# Patient Record
Sex: Female | Born: 1945
Health system: Southern US, Community
[De-identification: ages and names within clinical notes are randomized; demographics above are authoritative.]

## PROBLEM LIST (undated history)

## (undated) DIAGNOSIS — I499 Cardiac arrhythmia, unspecified: Secondary | ICD-10-CM

## (undated) DIAGNOSIS — K635 Polyp of colon: Secondary | ICD-10-CM

## (undated) DIAGNOSIS — K219 Gastro-esophageal reflux disease without esophagitis: Secondary | ICD-10-CM

## (undated) DIAGNOSIS — R945 Abnormal results of liver function studies: Secondary | ICD-10-CM

## (undated) DIAGNOSIS — K5792 Diverticulitis of intestine, part unspecified, without perforation or abscess without bleeding: Secondary | ICD-10-CM

## (undated) DIAGNOSIS — G4733 Obstructive sleep apnea (adult) (pediatric): Secondary | ICD-10-CM

## (undated) DIAGNOSIS — I779 Disorder of arteries and arterioles, unspecified: Secondary | ICD-10-CM

## (undated) DIAGNOSIS — K222 Esophageal obstruction: Secondary | ICD-10-CM

## (undated) DIAGNOSIS — I10 Essential (primary) hypertension: Secondary | ICD-10-CM

## (undated) DIAGNOSIS — M1990 Unspecified osteoarthritis, unspecified site: Secondary | ICD-10-CM

## (undated) DIAGNOSIS — R9431 Abnormal electrocardiogram [ECG] [EKG]: Secondary | ICD-10-CM

## (undated) DIAGNOSIS — D126 Benign neoplasm of colon, unspecified: Secondary | ICD-10-CM

## (undated) DIAGNOSIS — I82409 Acute embolism and thrombosis of unspecified deep veins of unspecified lower extremity: Secondary | ICD-10-CM

## (undated) DIAGNOSIS — I493 Ventricular premature depolarization: Secondary | ICD-10-CM

## (undated) DIAGNOSIS — L309 Dermatitis, unspecified: Secondary | ICD-10-CM

## (undated) DIAGNOSIS — I498 Other specified cardiac arrhythmias: Secondary | ICD-10-CM

## (undated) DIAGNOSIS — E785 Hyperlipidemia, unspecified: Secondary | ICD-10-CM

## (undated) DIAGNOSIS — I2510 Atherosclerotic heart disease of native coronary artery without angina pectoris: Secondary | ICD-10-CM

## (undated) HISTORY — DX: Other specified cardiac arrhythmias: I49.8

## (undated) HISTORY — DX: Esophageal obstruction: K22.2

## (undated) HISTORY — DX: Hyperlipidemia, unspecified: E78.5

## (undated) HISTORY — PX: CHOLECYSTECTOMY: SHX55

## (undated) HISTORY — PX: EYE SURGERY: SHX253

## (undated) HISTORY — DX: Gastro-esophageal reflux disease without esophagitis: K21.9

## (undated) HISTORY — DX: Benign neoplasm of colon, unspecified: D12.6

## (undated) HISTORY — PX: ABDOMINAL HYSTERECTOMY: SHX81

## (undated) HISTORY — DX: Dermatitis, unspecified: L30.9

## (undated) HISTORY — DX: Abnormal electrocardiogram (ECG) (EKG): R94.31

## (undated) HISTORY — DX: Obstructive sleep apnea (adult) (pediatric): G47.33

## (undated) HISTORY — DX: Disorder of arteries and arterioles, unspecified: I77.9

## (undated) HISTORY — DX: Cardiac arrhythmia, unspecified: I49.9

## (undated) HISTORY — DX: Diverticulitis of intestine, part unspecified, without perforation or abscess without bleeding: K57.92

## (undated) HISTORY — DX: Ventricular premature depolarization: I49.3

## (undated) HISTORY — DX: Abnormal results of liver function studies: R94.5

## (undated) HISTORY — DX: Atherosclerotic heart disease of native coronary artery without angina pectoris: I25.10

## (undated) HISTORY — PX: KNEE SURGERY: SHX244

## (undated) HISTORY — DX: Polyp of colon: K63.5

## (undated) HISTORY — DX: Acute embolism and thrombosis of unspecified deep veins of unspecified lower extremity: I82.409

## (undated) HISTORY — DX: Essential (primary) hypertension: I10

---

## 1993-07-30 ENCOUNTER — Encounter (INDEPENDENT_AMBULATORY_CARE_PROVIDER_SITE_OTHER): Payer: Self-pay | Admitting: Gastroenterology

## 1997-06-14 ENCOUNTER — Encounter (INDEPENDENT_AMBULATORY_CARE_PROVIDER_SITE_OTHER): Payer: Self-pay | Admitting: Gastroenterology

## 1997-07-19 ENCOUNTER — Encounter: Payer: Self-pay | Admitting: Internal Medicine

## 1997-07-29 ENCOUNTER — Encounter (INDEPENDENT_AMBULATORY_CARE_PROVIDER_SITE_OTHER): Payer: Self-pay | Admitting: Gastroenterology

## 2000-01-11 ENCOUNTER — Encounter: Admission: RE | Admit: 2000-01-11 | Discharge: 2000-04-10 | Payer: Self-pay | Admitting: Family Medicine

## 2000-10-24 ENCOUNTER — Encounter (INDEPENDENT_AMBULATORY_CARE_PROVIDER_SITE_OTHER): Payer: Self-pay | Admitting: Gastroenterology

## 2000-11-18 ENCOUNTER — Encounter (INDEPENDENT_AMBULATORY_CARE_PROVIDER_SITE_OTHER): Payer: Self-pay | Admitting: Gastroenterology

## 2000-12-15 ENCOUNTER — Encounter: Payer: Self-pay | Admitting: Internal Medicine

## 2001-03-22 ENCOUNTER — Encounter: Admission: RE | Admit: 2001-03-22 | Discharge: 2001-03-22 | Payer: Self-pay | Admitting: Family Medicine

## 2001-03-22 ENCOUNTER — Encounter: Payer: Self-pay | Admitting: Family Medicine

## 2002-03-14 ENCOUNTER — Encounter: Payer: Self-pay | Admitting: Family Medicine

## 2002-03-14 ENCOUNTER — Encounter: Admission: RE | Admit: 2002-03-14 | Discharge: 2002-03-14 | Payer: Self-pay | Admitting: Family Medicine

## 2002-05-04 ENCOUNTER — Encounter: Payer: Self-pay | Admitting: Surgery

## 2002-05-09 ENCOUNTER — Encounter (INDEPENDENT_AMBULATORY_CARE_PROVIDER_SITE_OTHER): Payer: Self-pay | Admitting: Specialist

## 2002-05-09 ENCOUNTER — Encounter: Payer: Self-pay | Admitting: Surgery

## 2002-05-09 ENCOUNTER — Ambulatory Visit (HOSPITAL_COMMUNITY): Admission: RE | Admit: 2002-05-09 | Discharge: 2002-05-10 | Payer: Self-pay | Admitting: Surgery

## 2002-05-09 ENCOUNTER — Encounter (INDEPENDENT_AMBULATORY_CARE_PROVIDER_SITE_OTHER): Payer: Self-pay | Admitting: *Deleted

## 2003-09-17 ENCOUNTER — Encounter: Admission: RE | Admit: 2003-09-17 | Discharge: 2003-09-17 | Payer: Self-pay | Admitting: Family Medicine

## 2003-10-03 ENCOUNTER — Emergency Department (HOSPITAL_COMMUNITY): Admission: EM | Admit: 2003-10-03 | Discharge: 2003-10-04 | Payer: Self-pay | Admitting: Emergency Medicine

## 2004-08-11 ENCOUNTER — Encounter: Admission: RE | Admit: 2004-08-11 | Discharge: 2004-08-11 | Payer: Self-pay | Admitting: Otolaryngology

## 2004-11-13 ENCOUNTER — Encounter: Admission: RE | Admit: 2004-11-13 | Discharge: 2004-11-13 | Payer: Self-pay | Admitting: Family Medicine

## 2004-12-18 ENCOUNTER — Emergency Department (HOSPITAL_COMMUNITY): Admission: EM | Admit: 2004-12-18 | Discharge: 2004-12-18 | Payer: Self-pay | Admitting: Emergency Medicine

## 2004-12-18 ENCOUNTER — Encounter (INDEPENDENT_AMBULATORY_CARE_PROVIDER_SITE_OTHER): Payer: Self-pay | Admitting: *Deleted

## 2005-06-11 ENCOUNTER — Emergency Department (HOSPITAL_COMMUNITY): Admission: EM | Admit: 2005-06-11 | Discharge: 2005-06-11 | Payer: Self-pay | Admitting: Emergency Medicine

## 2005-06-15 ENCOUNTER — Emergency Department (HOSPITAL_COMMUNITY): Admission: EM | Admit: 2005-06-15 | Discharge: 2005-06-16 | Payer: Self-pay | Admitting: Emergency Medicine

## 2006-01-04 ENCOUNTER — Encounter: Admission: RE | Admit: 2006-01-04 | Discharge: 2006-01-04 | Payer: Self-pay | Admitting: Family Medicine

## 2006-03-11 ENCOUNTER — Ambulatory Visit (HOSPITAL_BASED_OUTPATIENT_CLINIC_OR_DEPARTMENT_OTHER): Admission: RE | Admit: 2006-03-11 | Discharge: 2006-03-11 | Payer: Self-pay | Admitting: Orthopedic Surgery

## 2006-06-06 ENCOUNTER — Ambulatory Visit: Payer: Self-pay | Admitting: Gastroenterology

## 2006-07-14 ENCOUNTER — Ambulatory Visit: Payer: Self-pay | Admitting: Gastroenterology

## 2006-07-28 ENCOUNTER — Encounter (INDEPENDENT_AMBULATORY_CARE_PROVIDER_SITE_OTHER): Payer: Self-pay | Admitting: *Deleted

## 2006-07-28 ENCOUNTER — Encounter (INDEPENDENT_AMBULATORY_CARE_PROVIDER_SITE_OTHER): Payer: Self-pay | Admitting: Specialist

## 2006-07-28 ENCOUNTER — Ambulatory Visit: Payer: Self-pay | Admitting: Gastroenterology

## 2006-08-11 ENCOUNTER — Ambulatory Visit (HOSPITAL_COMMUNITY): Admission: RE | Admit: 2006-08-11 | Discharge: 2006-08-11 | Payer: Self-pay | Admitting: Cardiology

## 2006-08-24 ENCOUNTER — Emergency Department (HOSPITAL_COMMUNITY): Admission: EM | Admit: 2006-08-24 | Discharge: 2006-08-24 | Payer: Self-pay | Admitting: Emergency Medicine

## 2006-08-31 ENCOUNTER — Ambulatory Visit: Payer: Self-pay | Admitting: Internal Medicine

## 2006-09-22 ENCOUNTER — Ambulatory Visit: Payer: Self-pay | Admitting: Internal Medicine

## 2007-02-03 ENCOUNTER — Encounter: Admission: RE | Admit: 2007-02-03 | Discharge: 2007-02-03 | Payer: Self-pay | Admitting: Family Medicine

## 2007-06-23 ENCOUNTER — Encounter: Admission: RE | Admit: 2007-06-23 | Discharge: 2007-06-23 | Payer: Self-pay | Admitting: Family Medicine

## 2007-07-11 ENCOUNTER — Encounter: Admission: RE | Admit: 2007-07-11 | Discharge: 2007-07-11 | Payer: Self-pay | Admitting: Family Medicine

## 2007-12-22 ENCOUNTER — Encounter (INDEPENDENT_AMBULATORY_CARE_PROVIDER_SITE_OTHER): Payer: Self-pay | Admitting: *Deleted

## 2007-12-22 ENCOUNTER — Encounter: Admission: RE | Admit: 2007-12-22 | Discharge: 2007-12-22 | Payer: Self-pay | Admitting: Family Medicine

## 2008-02-14 ENCOUNTER — Encounter: Admission: RE | Admit: 2008-02-14 | Discharge: 2008-02-14 | Payer: Self-pay | Admitting: Family Medicine

## 2008-06-11 ENCOUNTER — Encounter: Admission: RE | Admit: 2008-06-11 | Discharge: 2008-06-11 | Payer: Self-pay | Admitting: Family Medicine

## 2008-10-11 LAB — HM MAMMOGRAPHY

## 2008-12-17 DIAGNOSIS — K219 Gastro-esophageal reflux disease without esophagitis: Secondary | ICD-10-CM | POA: Insufficient documentation

## 2008-12-17 DIAGNOSIS — D126 Benign neoplasm of colon, unspecified: Secondary | ICD-10-CM | POA: Insufficient documentation

## 2008-12-17 DIAGNOSIS — K222 Esophageal obstruction: Secondary | ICD-10-CM | POA: Insufficient documentation

## 2008-12-17 DIAGNOSIS — K5730 Diverticulosis of large intestine without perforation or abscess without bleeding: Secondary | ICD-10-CM | POA: Insufficient documentation

## 2008-12-17 DIAGNOSIS — K648 Other hemorrhoids: Secondary | ICD-10-CM | POA: Insufficient documentation

## 2008-12-26 ENCOUNTER — Ambulatory Visit: Payer: Self-pay | Admitting: Internal Medicine

## 2008-12-26 DIAGNOSIS — Z860100 Personal history of colon polyps, unspecified: Secondary | ICD-10-CM | POA: Insufficient documentation

## 2008-12-26 DIAGNOSIS — R1013 Epigastric pain: Secondary | ICD-10-CM | POA: Insufficient documentation

## 2008-12-26 DIAGNOSIS — R1084 Generalized abdominal pain: Secondary | ICD-10-CM | POA: Insufficient documentation

## 2008-12-30 ENCOUNTER — Ambulatory Visit (HOSPITAL_COMMUNITY): Admission: RE | Admit: 2008-12-30 | Discharge: 2008-12-30 | Payer: Self-pay | Admitting: Internal Medicine

## 2009-01-01 DIAGNOSIS — E1122 Type 2 diabetes mellitus with diabetic chronic kidney disease: Secondary | ICD-10-CM | POA: Insufficient documentation

## 2009-01-01 DIAGNOSIS — E119 Type 2 diabetes mellitus without complications: Secondary | ICD-10-CM | POA: Insufficient documentation

## 2009-06-18 ENCOUNTER — Telehealth: Payer: Self-pay | Admitting: Internal Medicine

## 2009-06-19 ENCOUNTER — Telehealth (INDEPENDENT_AMBULATORY_CARE_PROVIDER_SITE_OTHER): Payer: Self-pay | Admitting: *Deleted

## 2009-07-03 ENCOUNTER — Encounter (INDEPENDENT_AMBULATORY_CARE_PROVIDER_SITE_OTHER): Payer: Self-pay | Admitting: *Deleted

## 2009-07-22 ENCOUNTER — Emergency Department (HOSPITAL_COMMUNITY): Admission: EM | Admit: 2009-07-22 | Discharge: 2009-07-22 | Payer: Self-pay | Admitting: Emergency Medicine

## 2009-08-06 ENCOUNTER — Ambulatory Visit: Payer: Self-pay | Admitting: Internal Medicine

## 2009-08-06 DIAGNOSIS — R197 Diarrhea, unspecified: Secondary | ICD-10-CM | POA: Insufficient documentation

## 2009-08-12 ENCOUNTER — Telehealth (INDEPENDENT_AMBULATORY_CARE_PROVIDER_SITE_OTHER): Payer: Self-pay | Admitting: *Deleted

## 2009-11-03 ENCOUNTER — Telehealth (INDEPENDENT_AMBULATORY_CARE_PROVIDER_SITE_OTHER): Payer: Self-pay | Admitting: *Deleted

## 2009-11-25 ENCOUNTER — Encounter: Payer: Self-pay | Admitting: Pulmonary Disease

## 2009-11-26 ENCOUNTER — Observation Stay (HOSPITAL_COMMUNITY): Admission: EM | Admit: 2009-11-26 | Discharge: 2009-11-29 | Payer: Self-pay | Admitting: Emergency Medicine

## 2009-11-26 ENCOUNTER — Encounter (INDEPENDENT_AMBULATORY_CARE_PROVIDER_SITE_OTHER): Payer: Self-pay | Admitting: Internal Medicine

## 2009-11-26 ENCOUNTER — Ambulatory Visit: Payer: Self-pay | Admitting: Internal Medicine

## 2009-12-03 ENCOUNTER — Telehealth: Payer: Self-pay | Admitting: Internal Medicine

## 2009-12-05 ENCOUNTER — Emergency Department (HOSPITAL_COMMUNITY): Admission: EM | Admit: 2009-12-05 | Discharge: 2009-12-06 | Payer: Self-pay | Admitting: Emergency Medicine

## 2009-12-08 ENCOUNTER — Telehealth: Payer: Self-pay | Admitting: Internal Medicine

## 2009-12-25 DIAGNOSIS — E785 Hyperlipidemia, unspecified: Secondary | ICD-10-CM | POA: Insufficient documentation

## 2009-12-25 DIAGNOSIS — E782 Mixed hyperlipidemia: Secondary | ICD-10-CM | POA: Insufficient documentation

## 2009-12-25 DIAGNOSIS — I498 Other specified cardiac arrhythmias: Secondary | ICD-10-CM | POA: Insufficient documentation

## 2010-05-19 ENCOUNTER — Ambulatory Visit (HOSPITAL_COMMUNITY): Admission: RE | Admit: 2010-05-19 | Discharge: 2010-05-19 | Payer: Self-pay | Admitting: Cardiology

## 2010-05-19 ENCOUNTER — Encounter: Payer: Self-pay | Admitting: Pulmonary Disease

## 2010-06-26 ENCOUNTER — Ambulatory Visit: Payer: Self-pay | Admitting: Pulmonary Disease

## 2010-06-26 DIAGNOSIS — R0600 Dyspnea, unspecified: Secondary | ICD-10-CM | POA: Insufficient documentation

## 2010-06-26 DIAGNOSIS — R0602 Shortness of breath: Secondary | ICD-10-CM | POA: Insufficient documentation

## 2010-06-26 DIAGNOSIS — R0609 Other forms of dyspnea: Secondary | ICD-10-CM | POA: Insufficient documentation

## 2010-07-09 ENCOUNTER — Ambulatory Visit: Payer: Self-pay | Admitting: Pulmonary Disease

## 2010-08-24 ENCOUNTER — Observation Stay (HOSPITAL_COMMUNITY): Admission: EM | Admit: 2010-08-24 | Discharge: 2010-08-25 | Payer: Self-pay | Admitting: Emergency Medicine

## 2010-10-23 ENCOUNTER — Ambulatory Visit (HOSPITAL_COMMUNITY)
Admission: RE | Admit: 2010-10-23 | Discharge: 2010-10-23 | Payer: Self-pay | Source: Home / Self Care | Attending: Family Medicine | Admitting: Family Medicine

## 2010-10-23 ENCOUNTER — Encounter (INDEPENDENT_AMBULATORY_CARE_PROVIDER_SITE_OTHER): Payer: Self-pay | Admitting: Family Medicine

## 2010-10-26 ENCOUNTER — Encounter: Payer: Self-pay | Admitting: Internal Medicine

## 2010-10-31 ENCOUNTER — Encounter: Payer: Self-pay | Admitting: Gastroenterology

## 2010-11-02 ENCOUNTER — Ambulatory Visit
Admission: RE | Admit: 2010-11-02 | Discharge: 2010-11-02 | Payer: Self-pay | Source: Home / Self Care | Attending: Internal Medicine | Admitting: Internal Medicine

## 2010-11-02 ENCOUNTER — Encounter: Payer: Self-pay | Admitting: Internal Medicine

## 2010-11-02 DIAGNOSIS — I10 Essential (primary) hypertension: Secondary | ICD-10-CM | POA: Insufficient documentation

## 2010-11-06 ENCOUNTER — Emergency Department (HOSPITAL_COMMUNITY)
Admission: EM | Admit: 2010-11-06 | Discharge: 2010-11-07 | Payer: Self-pay | Source: Home / Self Care | Admitting: Emergency Medicine

## 2010-11-06 LAB — CBC
HCT: 37.6 % (ref 36.0–46.0)
Hemoglobin: 12.8 g/dL (ref 12.0–15.0)
MCH: 29.6 pg (ref 26.0–34.0)
MCHC: 34 g/dL (ref 30.0–36.0)
MCV: 86.8 fL (ref 78.0–100.0)
Platelets: 238 10*3/uL (ref 150–400)
RBC: 4.33 MIL/uL (ref 3.87–5.11)
RDW: 12.1 % (ref 11.5–15.5)
WBC: 8.9 10*3/uL (ref 4.0–10.5)

## 2010-11-06 LAB — URINALYSIS, ROUTINE W REFLEX MICROSCOPIC
Bilirubin Urine: NEGATIVE
Hgb urine dipstick: NEGATIVE
Ketones, ur: NEGATIVE mg/dL
Nitrite: NEGATIVE
Protein, ur: NEGATIVE mg/dL
Specific Gravity, Urine: 1.016 (ref 1.005–1.030)
Urine Glucose, Fasting: 500 mg/dL — AB
Urobilinogen, UA: 0.2 mg/dL (ref 0.0–1.0)
pH: 5 (ref 5.0–8.0)

## 2010-11-06 LAB — BASIC METABOLIC PANEL
BUN: 10 mg/dL (ref 6–23)
CO2: 25 mEq/L (ref 19–32)
Calcium: 9.7 mg/dL (ref 8.4–10.5)
Chloride: 99 mEq/L (ref 96–112)
Creatinine, Ser: 0.98 mg/dL (ref 0.4–1.2)
GFR calc Af Amer: >60 mL/min (ref >60)
GFR calc non Af Amer: 57 mL/min — ABNORMAL LOW (ref >60)
Glucose, Bld: 350 mg/dL — ABNORMAL HIGH (ref 70–99)
Potassium: 3.8 mEq/L (ref 3.5–5.1)
Sodium: 137 mEq/L (ref 135–145)

## 2010-11-06 LAB — TROPONIN I: Troponin I: <0.01 ng/mL (ref 0.00–0.06)

## 2010-11-06 LAB — CK TOTAL AND CKMB (NOT AT ARMC)
CK, MB: 3.4 ng/mL (ref 0.3–4.0)
Relative Index: 1.9 (ref 0.0–2.5)
Total CK: 179 U/L — ABNORMAL HIGH (ref 7–177)

## 2010-11-10 NOTE — Progress Notes (Signed)
Summary: [facility]   Imported By: Sherian Rein 08/08/2009 09:24:01  _____________________________________________________________________  External Attachment:    Type:   Image     Comment:   External Document

## 2010-11-10 NOTE — Procedures (Signed)
Summary: EGD   EGD  Procedure date:  11/18/2000  Findings:      Findings: Stricture:  Location: [facility]   Patient Name: Alice Cole, Alice Cole MRN:  Procedure Procedures: Colonoscopy CPT: 28413.  Personnel: Endoscopist: Ulyess Mort, MD.  Exam Location: Exam performed in Outpatient Clinic. Outpatient  Patient Consent: Procedure, Alternatives, Risks and Benefits discussed, consent obtained, from patient.  Indications Symptoms: Abdominal pain / bloating.  Surveillance of: Adenomatous Polyp(s).  History  Pre-Exam Physical: Performed Dec 15, 2000. Cardio-pulmonary exam, Rectal exam, HEENT exam , Abdominal exam, Extremity exam, Mental status exam WNL.  Exam Exam: Extent of exam reached: Cecum, extent intended: Cecum.  The cecum was identified by appendiceal orifice and IC valve. Colon retroflexion performed. Images were not taken. ASA Classification: II. Tolerance: good.  Monitoring: Pulse and BP monitoring, Oximetry used. Supplemental O2 given.  Colon Prep Prep results: good.  Sedation Meds: Fentanyl 100 mcg. Versed 10 mg.  Findings - DIVERTICULOSIS: Transverse Colon to Sigmoid Colon. ICD9: Diverticulosis: 562.10.   Assessment Abnormal examination, see findings above.  Diagnoses: 562.10: Diverticulosis.   Events  Unplanned Interventions: No intervention was required.  Unplanned Events: There were no complications. Plans Patient Education: Patient given standard instructions for: Diverticulosis. a normal exam. Patient instructed to get routine colonoscopy every 4 years.  Disposition: After procedure patient sent to recovery. After recovery patient sent home.   This report was created from the original endoscopy report, which was reviewed and signed by the above listed endoscopist.

## 2010-11-10 NOTE — Progress Notes (Signed)
Summary: CHEST PAIN FROM MEDICATION  Phone Note Call from Patient Call back at Home Phone [phone]   Caller: Patient Summary of Call: PT TAKING  VERAPAMIL ER 180 MG MAKING OT HAVE MORE CHEST PAINS  Initial call taken by: Judie Grieve,  December 03, 2009 2:37 PM  Follow-up for Phone Call        Spoke with patient. Patient states Dr. Johney Frame prescrived Verapamil Er 180 mg for her arrhytmias while she was in the hospital. Pt. states the first few days she was tolerating medication well. The last three days she has been having sharp chest pain, sometimes across the chest sometime on left side of chest. Pt. denies SOB. Pt. states sometime she has faint like feelling with the pain. According to pt. she was seen by her regular cardiologist yesterday Dr. Mayford Knife which suggested to pt. it could be the Verapamil ER. Dr. Mayford Knife advised pt. to call Dr. Johney Frame. Ollen Gross, RN, BSN  December 03, 2009 2:53 PM Dr Johney Frame aware. Md was called to do consult for pt's chest pain. Dr. Johney Frame recommends for pt. to continue on Verapamil 180 mg and have a Myoview in Dr. Norris Cross office as  recommended on his consultation done while pt. was in the hospital. Pt. notified. She states has an appointment for myoview to be done this friday at Dr. Norris Cross office.  Follow-up by: Ollen Gross, RN, BSN,  December 03, 2009 3:30 PM

## 2010-11-10 NOTE — Progress Notes (Signed)
Summary: req call back  Phone Note Call from Patient Call back at [phone]   Caller: Patient Summary of Call: Rt leg pain and feet swollen, went back to hospital friday night for elevated BP, request call back Initial call taken by: Migdalia Dk,  December 08, 2009 2:06 PM  Follow-up for Phone Call        Dr Mayford Knife is pt's primary cardiologist.  Advised pt to call them and call me back if she doesnt have any luck getting an appointment Dennis Bast, RN, BSN  December 08, 2009 2:37 PM

## 2010-11-10 NOTE — Progress Notes (Signed)
  Phone Note Outgoing Call   Summary of Call: Pt. could not come as school called and she had to go to school to pick up sick granchild will keep f/u appt. with Dr. Clyde Canterbury tomorrow. Initial call taken by: Teryl Lucy RN,  June 19, 2009 10:01 AM

## 2010-11-10 NOTE — Procedures (Signed)
Summary: Colonoscopy   Colonoscopy  Procedure date:  07/28/2006  Findings:      Location:  [facility].   Patient Name: Alice Cole, Alice Cole MRN:  Procedure Procedures: Colonoscopy CPT: [phone].  Personnel: Endoscopist: Ulyess Mort, MD.  Exam Location: Exam performed in Outpatient Clinic. Outpatient  Patient Consent: Procedure, Alternatives, Risks and Benefits discussed, consent obtained, from patient. Consent was obtained by the RN.  Indications  Surveillance of: Adenomatous Polyp(s).  History  Current Medications: Patient is not currently taking Coumadin.  Pre-Exam Physical: Entire physical exam was normal.  Comments: Pt. history reviewed/updated, physical exam performed prior to initiation of sedation? Exam Exam: Extent of exam reached: Cecum, extent intended: Cecum.  The cecum was identified by appendiceal orifice and IC valve. Colon retroflexion performed. Images taken. ASA Classification: II. Tolerance: good.  Monitoring: Pulse and BP monitoring, Oximetry used. Supplemental O2 given.  Colon Prep Prep results: good.  Sedation Meds: Patient assessed and found to be appropriate for moderate (conscious) sedation. Fentanyl given IV. Versed given IV.  Findings POLYP: Ascending Colon, Maximum size: 5 mm. sessile polyp. Procedure:  hot biopsy, removed, retrieved, Polyp sent to pathology. ICD9: Colon Polyps: 211.3.  - DIVERTICULOSIS: Cecum to Sigmoid Colon. ICD9: Diverticulosis, Colon: 562.10. Comments: moderately severe  min. itis in 2 tics.  POLYP: Descending Colon, Maximum size: 4 mm. sessile polyp. Procedure:  hot biopsy, removed, retrieved, sent to pathology. ICD9: Colon Polyps: 211.3.  POLYP: Sigmoid Colon, Maximum size: 2 mm. sessile polyp. Procedure:  hot biopsy, not removed, not retrieved,  POLYP: Sigmoid Colon, Maximum size: 2 mm. sessile polyp. Procedure:  hot biopsy, not removed, not retrieved,  POLYP: Sigmoid Colon, Maximum size: 2 mm.  sessile polyp. Procedure:  hot biopsy, not removed, not retrieved,  POLYP: Sigmoid Colon, Maximum size: 2 mm. sessile polyp. Procedure:  hot biopsy, not removed, not retrieved,  POLYP: Sigmoid Colon, Maximum size: 2 mm. sessile polyp. Procedure:  hot biopsy, not removed, not retrieved,  - HEMORRHOIDS: Internal. Size: Grade I. ICD9: Hemorrhoids, Internal: 455.0.   Assessment Abnormal examination, see findings above.  Diagnoses: 211.3: Colon Polyps.  455.0: Hemorrhoids, Internal.  562.10: Diverticulosis, Colon.   Events  Unplanned Interventions: No intervention was required.  Unplanned Events: There were no complications. Plans Patient Education: Patient given standard instructions for: Polyps. Diverticulosis. Hemorrhoids. Patient instructed to get routine colonoscopy every 3 years.  Disposition: After procedure patient sent to recovery. After recovery patient sent home.   This report was created from the original endoscopy report, which was reviewed and signed by the above listed endoscopist.

## 2010-11-10 NOTE — Assessment & Plan Note (Signed)
Summary: STOMACH PAIN   History of Present Illness Visit Type: Follow-up Visit Primary GI MD: Yancey Flemings MD Primary Provider: Carolin Coy, MD Chief Complaint: upper abdominal pain, pt states history of diverticulitis History of Present Illness:   65 year old white female with a history of hypertension, diabetes mellitus, and gastroesophageal reflux disease all of which were stable. She is a former patient of Dr. Terrial Rhodes. He has treated her for reflux disease as well as perform colonoscopy for history of polyps. She presents today with a chief complaint of intermittent sharp and cramping epigastric pain. She has had the discomfort for 2-3 months. It occurs approximately 2 times per week though can occur consecutively for 3 days and rarely several times during the day. It generally lasts 10-30 minutes. Rarely all day. There is associated nausea but no vomiting. She denies dysphagia, fever, or weight loss. Her appetite has been somewhat less. She takes Nexium daily for her reflux disease. No reflux symptoms on medications. No particular lower GI complaints. Her last colonoscopy was performed in 2007. She was found to have diminutive polyps as well as diverticulosis. Followup in 3 years recommended. Her last upper endoscopy was performed in 2002. At that time, an esophageal stricture was dilated with a 50 [place] Maloney dilator. Patient denies shortness of breath, melena, or jaundice. She is status post cholecystectomy with a negative intraoperative cholangiogram in July 2003.   GI Review of Systems    Reports abdominal pain, acid reflux, and  loss of appetite.     Location of  Abdominal pain: epigastric area.    Denies belching, bloating, chest pain, dysphagia with liquids, dysphagia with solids, heartburn, nausea, vomiting, vomiting blood, weight loss, and  weight gain.      Reports diverticulosis.     Denies anal fissure, black tarry stools, change in bowel habit, constipation,  diarrhea, fecal incontinence, heme positive stool, hemorrhoids, irritable bowel syndrome, jaundice, light color stool, liver problems, rectal bleeding, and  rectal pain. Preventive Screening-Counseling & Management     Smoking Status: never      Drug Use:  no.      Current Medications (verified): 1)  Nexium 40 Mg Cpdr (Esomeprazole Magnesium) .Marland Kitchen.. 1 Capsule By Mouth Once Daily 2)  Premarin 0.625 Mg Tabs (Estrogens Conjugated) .Marland Kitchen.. 1 Tablet By Mouth Once Daily 3)  Hydralazine-Hctz 25-25 Mg Caps (Hydralazine-Hctz) .Marland Kitchen.. 1 Capsule By Mouth Once Daily 4)  Actos 30 Mg Tabs (Pioglitazone Hcl) .Marland Kitchen.. 1 Tablet By Mouth Once Daily 5)  Altace 5 Mg Tabs (Ramipril) .... 3  Tablet By Mouth Once Daily 6)  Cartia Xt 240 Mg Xr24h-Cap (Diltiazem Hcl Coated Beads) .Marland Kitchen.. 1 Capsule By Mouth Once Daily 7)  Metformin Hcl 500 Mg Tabs (Metformin Hcl) .... Take 2 Tablets By Mouth Two Times A Day 8)  Hydrochlorothiazide 25 Mg Tabs (Hydrochlorothiazide) .... Take 1 Tablet By Mouth Once A Day 9)  Lovaza 1 Gm Caps (Omega-3-Acid Ethyl Esters) .... Take 2 Tablets By Mouth Two Times A Day  Allergies (verified): 1)  Pamelor 2)  Toprol Xl (Metoprolol Succinate) 3)  Codeine 4)  Flagyl 5)  Trovan 6)  Beta Blockers  Past History:  Past Medical History:    Current Problems:     ESOPHAGEAL STRICTURE (ICD-530.3)    HEMORRHOIDS, INTERNAL (ICD-455.0)    DIVERTICULOSIS, COLON (ICD-562.10)    ADENOMATOUS COLONIC POLYP (ICD-211.3)    Diabetes    Hypertension  Past Surgical History:    Cholecystectomy    Hysterectomy    Knee  Surgery Rt  Family History:    No FH of Colon Cancer:    Family History of Ovarian Cancer: Daughter    Family History of Diabetes: MGM, Brother ?  Social History:    Married, 2 girls, 1 boy    Patient has never smoked.     Alcohol Use - no    Daily Caffeine Use 1 coke/day    Illicit Drug Use - no    Smoking Status:  never    Drug Use:  no  Review of Systems       the entire non-GI review  of systems was reviewed and reported as negative  Vital Signs:  Patient profile:   65 year old female Height:      67 inches Weight:      215.4 pounds BMI:     33.86 Pulse rate:   84 / minute Pulse rhythm:   regular BP sitting:   120 / 72  (left arm) Cuff size:   regular  Vitals Entered By: Francee Piccolo CMA (December 26, 2008 11:49 AM)  Physical Exam  General:  Obese,Well developed, well nourished, no acute distress. Head:  Normocephalic and atraumatic. Eyes:  PERRLA, no icterus. Nose:  No deformity, discharge,  or lesions. Mouth:  No deformity or lesions, dentition normal. Neck:  Supple; no masses or thyromegaly. Lungs:  Clear throughout to auscultation. Heart:  Regular rate and rhythm; no murmurs, rubs,  or bruits. Abdomen:  Soft, nontender and nondistended. No masses, hepatosplenomegaly or hernias noted. Normal bowel sounds. Msk:  Symmetrical with no gross deformities. Normal posture. Pulses:  Normal pulses noted. Extremities:  No clubbing, cyanosis, edema or deformities noted. Neurologic:  Alert and  oriented x4;  grossly normal neurologically. Skin:  Intact without significant lesions or rashes. Cervical Nodes:  No significant cervical or supraclavicular adenopathy. Psych:  Alert and cooperative. Normal mood and affect.   Impression & Recommendations:  Problem # 1:  ABDOMINAL PAIN-EPIGASTRIC (ICD-789.06) The patient presents today with a several month history of new onset intermittent sharp or cramping epigastric discomfort. She does have a history of reflux disease which seems well-controlled on Nexium. She is also status post cholecystectomy. Possible causes include grade 2 reflux, retained common bile duct stone, intestinal cramping or spasm.  Plan #1. Abdominal ultrasound rule out biliary dilation or common duct stone. As well evaluate the intra-abdominal organs #2. Diagnostic upper endoscopy. The nature the procedure as well as risks, benefits, and alternatives  were reviewed in detail. She understood and agreed to proceed #3. Levsin sublingual 0.125 mg one or 2 q.4 hours p.r.n. pain prescribed  Problem # 2:  GERD (ICD-530.81) Seemingly well-controlled on Nexium. Continue Nexium as well as reflux precautions with attention to weight loss  Problem # 3:  ESOPHAGEAL STRICTURE (ICD-530.3) asymptomatic. Repeat esophageal dilation p.r.n..  Problem # 4:  AODM (ICD-250.00) stable. We will hold diabetic medications prior to the procedure, the day of the procedure. This to avoid unwanted hypoglycemia  Problem # 5:  PERSONAL HX COLONIC POLYPS (ICD-V12.72) last colonoscopy in October of 2007. Anniversary colonoscopy planned for October 2010.  Patient Instructions: 1)  Abdominal Ultrasound 12/27/08 11:00  [facility] 2)  EGD [facility] 01/28/09 11:30 3)  Levsin SL 0.125 take 1-2 by mouth q 4 hours as needed pain 4)  The medication list was reviewed and reconciled.  All changed / newly prescribed medications were explained.  A complete medication list was provided to the patient / caregiver. 5)  Copy to: Dr. Hall Busing  Prescriptions: HYOSCYAMINE SULFATE 0.125 MG SUBL (HYOSCYAMINE SULFATE) 1-2 by mouth q 4 hours as needed pain  #30 x 6   Entered by:   Milford Cage CMA   Authorized by:   Hilarie Fredrickson MD   Signed by:   Milford Cage CMA on 12/26/2008   Method used:   Electronically to        [facility] [phone]* (retail)       [address]       [place], [place]  [address]       Ph: [phone] or [phone]       Fax: [phone]   RxID:   [id number]

## 2010-11-10 NOTE — Letter (Signed)
Summary: [facility] Instructions  [facility]  [address] [place], [place] [address]   Phone: [phone]  Fax: [phone]       Alice Cole    10-Mar-1946    MRN: [id number]        Procedure Day /Date:Thursday   08/14/09     Arrival Time:8:30 a.m.     Procedure Time:9:30 a.m.     Location of Procedure:                    [facility] (4th Floor) _ _  Alice Cole ( Outpatient Registration) _ _  [facility] ( Short Stay on [facility])                       PREPARATION FOR COLONOSCOPY WITH MOVIPREP   Starting 5 days prior to your procedure 08/10/09 do not eat nuts, seeds, popcorn, corn, beans, peas,  salads, or any raw vegetables.  Do not take any fiber supplements (e.g. Metamucil, Citrucel, and Benefiber).  THE DAY BEFORE YOUR PROCEDURE         DATE: 08/13/09  DAY: WEDNESDAY  1.  Drink clear liquids the entire day-NO SOLID FOOD  2.  Do not drink anything colored red or purple.  Avoid juices with pulp.  No orange juice.  3.  Drink at least 64 oz. (8 glasses) of fluid/clear liquids during the day to prevent dehydration and help the prep work efficiently.  CLEAR LIQUIDS INCLUDE: Water Jello Ice Popsicles Tea (sugar ok, no milk/cream) Powdered fruit flavored drinks Coffee (sugar ok, no milk/cream) Gatorade Juice: apple, white grape, white cranberry  Lemonade Clear bullion, consomm, broth Carbonated beverages (any kind) Strained chicken noodle soup Hard Candy                             4.  In the morning, mix first dose of MoviPrep solution:    Empty 1 Pouch A and 1 Pouch B into the disposable container    Add lukewarm drinking water to the top line of the container. Mix to dissolve    Refrigerate (mixed solution should be used within 24 hrs)  5.  Begin drinking the prep at 5:00 p.m. The MoviPrep container is divided by 4 marks.   Every 15 minutes drink the solution down to the next mark (approximately 8 oz) until  the full liter is complete.   6.  Follow completed prep with 16 oz of clear liquid of your choice (Nothing red or purple).  Continue to drink clear liquids until bedtime.  7.  Before going to bed, mix second dose of MoviPrep solution:    Empty 1 Pouch A and 1 Pouch B into the disposable container    Add lukewarm drinking water to the top line of the container. Mix to dissolve    Refrigerate  THE DAY OF YOUR PROCEDURE      DATE: 08/14/09 DAY: THURSDAY  Beginning at 4:30 a.m. (5 hours before procedure):         1. Every 15 minutes, drink the solution down to the next mark (approx 8 oz) until the full liter is complete.  2. Follow completed prep with 16 oz. of clear liquid of your choice.    3. You may drink clear liquids until 7:30 a.m. (2 HOURS BEFORE PROCEDURE).   MEDICATION INSTRUCTIONS  Unless otherwise instructed, you should take regular  prescription medications with a small sip of water   as early as possible the morning of your procedure.  Diabetic patients - see separate instructions.       OTHER INSTRUCTIONS  You will need a responsible adult at least 65 years of age to accompany you and drive you home.   This person must remain in the waiting room during your procedure.  Wear loose fitting clothing that is easily removed.  Leave jewelry and other valuables at home.  However, you may wish to bring a book to read or  an iPod/MP3 player to listen to music as you wait for your procedure to start.  Remove all body piercing jewelry and leave at home.  Total time from sign-in until discharge is approximately 2-3 hours.  You should go home directly after your procedure and rest.  You can resume normal activities the  day after your procedure.  The day of your procedure you should not:   Drive   Make legal decisions   Operate machinery   Drink alcohol   Return to work  You will receive specific instructions about eating, activities and medications before  you leave.    The above instructions have been reviewed and explained to me by   _______________________    I fully understand and can verbalize these instructions _____________________________ Date _________

## 2010-11-10 NOTE — Progress Notes (Signed)
Summary: [facility]   Imported By: Sherian Rein 08/08/2009 09:21:41  _____________________________________________________________________  External Attachment:    Type:   Image     Comment:   External Document

## 2010-11-10 NOTE — Progress Notes (Signed)
----   Converted from flag ---- ---- 08/12/2009 12:14 PM, Hilarie Fredrickson MD wrote: ok. please convert this to phone note and put in chart. thanks  ---- 08/12/2009 11:36 AM, Teryl Lucy RN wrote:   ---- 08/12/2009 11:32 AM, Leanor Kail [facility] wrote: Hurt her leg and can not walk. She cancelled her procedure on thursday 11-4 9:30am will call back to reschedule later. ------------------------------

## 2010-11-10 NOTE — Miscellaneous (Signed)
Summary: Orders Update pft charges  Clinical Lists Changes  Orders: Added new Service order of Carbon Monoxide diffusing w/capacity (94720) - Signed Added new Service order of Lung Volumes (94240) - Signed Added new Service order of Spirometry (Pre & Post) (94060) - Signed 

## 2010-11-10 NOTE — Progress Notes (Signed)
Summary: triage  Phone Note Call from Patient Call back at Home Phone [phone] Call back at [phone]   Caller: Patient Call For: Alice Cole Reason for Call: Talk to Nurse Summary of Call: Patient wants to be seen asap due to diverticulities flare up, has a lot of pain on her left side and medications given to her are not working Initial call taken by: Tawni Levy,  June 18, 2009 1:53 PM  Follow-up for Phone Call        Discussed with Dr Alice Cole to see PA. given an appt. for tomorrow. Follow-up by: Teryl Lucy RN,  June 18, 2009 3:12 PM

## 2010-11-10 NOTE — Procedures (Signed)
Summary: [place] Crane, MD   Colonoscopy  Procedure date:  12/15/2000  Findings:      Location:  [facility].  Results: Diverticulosis.        Colonoscopy  Procedure date:  12/15/2000  Findings:      Location:  [facility].  Results: Diverticulosis.        Patient Name: Alice Cole, Alice Cole MRN:  Procedure Procedures: Colonoscopy CPT: [phone].  Personnel: Endoscopist: Ulyess Mort, MD.  Exam Location: Exam performed in Outpatient Clinic. Outpatient  Patient Consent: Procedure, Alternatives, Risks and Benefits discussed, consent obtained, from patient.  Indications Symptoms: Abdominal pain / bloating.  Surveillance of: Adenomatous Polyp(s).  History  Pre-Exam Physical: Performed Dec 15, 2000. Cardio-pulmonary exam, Rectal exam, HEENT exam , Abdominal exam, Extremity exam, Mental status exam WNL.  Exam Exam: Extent of exam reached: Cecum, extent intended: Cecum.  The cecum was identified by appendiceal orifice and IC valve. Colon retroflexion performed. Images were not taken. ASA Classification: II. Tolerance: good.  Monitoring: Pulse and BP monitoring, Oximetry used. Supplemental O2 given.  Colon Prep Prep results: good.  Sedation Meds: Fentanyl 100 mcg. Versed 10 mg.  Findings - DIVERTICULOSIS: Transverse Colon to Sigmoid Colon. ICD9: Diverticulosis: 562.10.   Assessment Abnormal examination, see findings above.  Diagnoses: 562.10: Diverticulosis.   Events  Unplanned Interventions: No intervention was required.  Unplanned Events: There were no complications. Plans Patient Education: Patient given standard instructions for: Diverticulosis. a normal exam. Patient instructed to get routine colonoscopy every 4 years.  Disposition: After procedure patient sent to recovery. After recovery patient sent home.    This report was created from the original endoscopy report, which was reviewed and signed by the above  listed endoscopist.

## 2010-11-10 NOTE — Assessment & Plan Note (Signed)
Summary: ov for doe   Copy to:  Armanda Magic MD Primary Provider/Referring Provider:  Carolin Coy, MD  CC:  Pt here to discuss pft results. Pt states her breathing is the same from last visit. Pt states her cough is gone. Marland Kitchen  History of Present Illness: the pt comes in today for review of her recent pfts, ordered as part of a w/u for doe.  She was found to have no obstruction by spirometry, but did have mild airtrapping on her lung volumes.  Her flow volume loop also suggested subtle obstruction.  She also had mild restriction and a very mild decrease in dlco.  I have reviewed the study with her in detail, and answered all of her questions.  Current Medications (verified): 1)  Nexium 40 Mg Cpdr (Esomeprazole Magnesium) .Marland Kitchen.. 1 Capsule By Mouth Once Daily 2)  Premarin 0.625 Mg Tabs (Estrogens Conjugated) .Marland Kitchen.. 1 Tablet By Mouth Once Daily 3)  Actos 30 Mg Tabs (Pioglitazone Hcl) .Marland Kitchen.. 1 Tablet By Mouth Once Daily 4)  Altace 10 Mg Caps (Ramipril) .... One Tablet Two Times A Day 5)  Cartia Xt 240 Mg Xr24h-Cap (Diltiazem Hcl Coated Beads) .Marland Kitchen.. 1 Capsule By Mouth Once Daily 6)  Metformin Hcl 500 Mg Tabs (Metformin Hcl) .... Take 2 Tablets By Mouth Two Times A Day 7)  Hydrochlorothiazide 25 Mg Tabs (Hydrochlorothiazide) .... Take 1/2 Tablet By Mouth Once A Day 8)  Lovaza 1 Gm Caps (Omega-3-Acid Ethyl Esters) .... Take 2 Tablets By Mouth Two Times A Day 9)  Hyoscyamine Sulfate 0.125 Mg Subl (Hyoscyamine Sulfate) .Marland Kitchen.. 1-2 By Mouth Q 4 Hours As Needed Pain 10)  Lescol 20 Mg Caps (Fluvastatin Sodium) .... Take 1 Tablet By Mouth Once A Day in The Evening 11)  Glipizide Xl 10 Mg Xr24h-Tab (Glipizide) .... Take 1 Tablet By Mouth Once A Day 12)  Xanax 0.25 Mg Tabs (Alprazolam) .... One Tab Once Daily As Needed 13)  Aspirin 325 Mg Tabs (Aspirin) .... Once Daily  Allergies (verified): 1)  Pamelor 2)  Toprol Xl (Metoprolol Succinate) 3)  Codeine 4)  Flagyl 5)  Trovan 6)  Beta Blockers 7)   Bactrim 8)  Welchol 9)  Lescol 10)  Keflex 11)  Crestor 12)  Prinivil  Review of Systems       The patient complains of shortness of breath with activity, shortness of breath at rest, acid heartburn, indigestion, difficulty swallowing, nasal congestion/difficulty breathing through nose, and joint stiffness or pain.  The patient denies productive cough, non-productive cough, coughing up blood, chest pain, irregular heartbeats, loss of appetite, weight change, abdominal pain, sore throat, tooth/dental problems, headaches, sneezing, itching, ear ache, anxiety, depression, hand/feet swelling, rash, change in color of mucus, and fever.    Vital Signs:  Patient profile:   65 year old female Height:      67 inches Weight:      204 pounds BMI:     32.07 O2 Sat:      96 % on Room air Temp:     98.2 degrees F oral Pulse rate:   99 / minute BP sitting:   148 / 76  (right arm) Cuff size:   large  Vitals Entered By: Carver Fila (July 09, 2010 11:47 AM)  O2 Flow:  Room air CC: Pt here to discuss pft results. Pt states her breathing is the same from last visit. Pt states her cough is gone.  Comments meds and allergies updated Phone number updated Carver Fila  July 09, 2010 11:48 AM    Physical Exam  General:  obese female in nad Lungs:  clear to auscultation Heart:  rrr, no mrg Extremities:  no signficant edema, no cyanosis  Neurologic:  alert and oriented, moves all 4.   Impression & Recommendations:  Problem # 1:  DYSPNEA ON EXERTION (ICD-786.09) the pt has very subtle airflow obstruction on pfts indicated by airtrapping, but I suspect the majority of her issue is weight and conditioning.  I am willing to give her a trial of a LABA to see if she sees a difference, but suspect it will not make a big impact.  I will start her on foradil for the next few weeks, but have also stressed the importance of conditioning and weight loss.  Medications Added to Medication List This  Visit: 1)  Altace 10 Mg Caps (Ramipril) .... One tablet two times a day 2)  Hydrochlorothiazide 25 Mg Tabs (Hydrochlorothiazide) .... Take 1/2 tablet by mouth once a day  Other Orders: Est. Patient Level III (04540)  Patient Instructions: 1)  will give you a 2 week trial of foradil one in am and pm for next 2 weeks.  Please call and give me feedback to see if it helps. 2)  work on weight loss and conditioning.

## 2010-11-10 NOTE — Op Note (Signed)
Summary: operative report    NAME:  FRAYA, UEDA                             ACCOUNT NO.:  [id number]   MEDICAL RECORD NO.:  [id number]                   PATIENT TYPE:  OIB   LOCATION:  2899                                 FACILITY:  [facility]   PHYSICIAN:  Sandria Bales. Ezzard Standing, M.D.               DATE OF BIRTH:  [birth year]   DATE OF PROCEDURE:  05/09/2002  DATE OF DISCHARGE:                                 OPERATIVE REPORT   PREOPERATIVE DIAGNOSIS:  Chronic cholecystitis and cholelithiasis.   POSTOPERATIVE DIAGNOSES:  Chronic cholecystitis and cholelithiasis.   OPERATION PERFORMED:  Laparoscopic cholecystectomy with intraoperative  cholangiogram.   SURGEON:  Sandria Bales. Ezzard Standing, M.D.   FIRST ASSISTANT:  Gita Kudo, M.D.   ANESTHESIA:  General endotracheal.   ESTIMATED BLOOD LOSS:  Minimal.   INDICATIONS FOR PROCEDURE:  Ms. Streicher is a 65 year old white female who is a  patient of Dr. Carolin Coy, sees Dr. Candace Cruise from a cardiology  standpoint and Dr. Victorino Dike from a GI standpoint.  She has symptomatic  cholelithiasis.  She now comes for attempted laparoscopic cholecystectomy.  Both the indications and potential complications of the procedure have been  discussed with the patient.   DESCRIPTION OF PROCEDURE:  The patient was placed in the supine position  with both arms out to her side.  She was given 1 gm of Ancef at the  initiation of the procedure, had PAS stockings and an orogastric tube  passed.  The abdomen was prepped with Betadine solution and sterilely  draped.  An infraumbilical incision was made with sharp dissection carried  down to the abdominal cavity.  A 0 degree laparoscope was inserted through a  12 mm Hasson trocar.  The Hasson trocar was secured with a 0 Vicryl suture.  The laparoscope was inserted into the abdomen after it had been pressurized  with CO2 and abdominal exploration carried out.  I could not really see any  of the pelvic organs.  Her  omentum was covered with fat.  Both lobes of her  liver were visualized, were somewhat fatty nature.  The anterior wall of the  stomach was seen and unremarkable.   Three additional trocars were placed, a 10 mm Ethicon trocar in the  subxiphoid location, a 5 mm Ethicon trocar in the right midsubcostal  location, and a 5 mm Ethicon trocar in the right lateral subcostal location.  The gallbladder was then identified, grasped, rotated cephalad.  The patient  had a fair amount of adhesion in the scar tissue around the gallbladder  cystic duct junction which were taken down.  The cystic artery was  identified, triply endoclipped and divided.  The cystic duct lymph node was  stripped away but left attached using fat.  I then localized the cystic duct  itself.   A clip was placed on the gallbladder side of the cystic  duct.  An  intraoperative cholangiogram was obtained using a cutoff taut catheter  inserted through a 14 gauge Jelco catheter.  The taut catheter was inserted  into the side of the ____ and secured with an  Endo clip.  An intraoperative  cholangiogram was obtained.  The intraoperative cholangiogram was obtained  using half strength Hypaque solution about 6 to 8 cc under direct  fluoroscopy.  This showed free flow of contrast down the cystic duct, down  the common bile duct into the duodenum and also at the hepatic radicals.  There was no obstruction and no mass.  Endoscopically normal intraoperative  cholangiogram.   The taut catheter was then removed.  The cystic duct triply endoclipped,  then divided.  The gallbladder was then sharply and bluntly dissected from  the gallbladder bed using primarily hook Bovie coagulation.  Prior to  complete division of the gallbladder from the gallbladder bed, the triangle  of Calot and the gallbladder bed were visualized.  There was no dye leak, no  bleeding and the gallbladder was then divided, delivered through the  umbilicus intact and sent  to pathology.   The patient tolerated the procedure well, was transported to the recovery  room in good condition.  Each port site was visualized as was removed.  The  umbilical port was closed with a 0 Vicryl suture.  The skin at each port was  closed with a 4-0 Vicryl suture, painted with tincture of benzoin, steri-  stripped and sterilely dressed.   The patient was then transported to the recovery room in good condition.  Sponge and needle counts were correct at the end of this case.                                                   Sandria Bales. Ezzard Standing, M.D.    DHN/MEDQ  D:  05/09/2002  T:  05/12/2002  Job:  [id number]   cc:   Otilio Connors. Gerri Spore, M.D.   Helen A. Fraser Din, M.D.   Ulyess Mort, M.D. [facility]

## 2010-11-10 NOTE — Letter (Signed)
Summary: Diabetic Instructions  [facility]  [address] [place], [place] [address]   Phone: [phone]  Fax: [phone]    [name] 05/27/1946 MRN: [id number]   X   ORAL DIABETIC MEDICATION INSTRUCTIONS  The day before your procedure:   Take your diabetic pill as you do normally  The day of your procedure:   Do not take your diabetic pill    We will check your blood sugar levels during the admission process and again in Recovery before discharging you home  ________________________________________________________________________  _  _   INSULIN (LONG ACTING) MEDICATION INSTRUCTIONS (Lantus, NPH, 70/30, Humulin, Novolin-N)   The day before your procedure:   Take  your regular evening dose    The day of your procedure:   Do not take your morning dose    _  _   INSULIN (SHORT ACTING) MEDICATION INSTRUCTIONS (Regular, Humulog, Novolog)   The day before your procedure:   Do not take your evening dose   The day of your procedure:   Do not take your morning dose   _  _   INSULIN PUMP MEDICATION INSTRUCTIONS  We will contact the physician managing your diabetic care for written dosage instructions for the day before your procedure and the day of your procedure.  Once we have received the instructions, we will contact you.

## 2010-11-10 NOTE — Progress Notes (Signed)
Summary: Record request  Request for records received from [facility]. Mailed 21 pages to address provided. Dena Chavis  November 03, 2009 10:04 AM

## 2010-11-10 NOTE — Letter (Signed)
Summary: Recall Colonoscopy Letter  [facility]  [address] [place], [place] [address]   Phone: [phone]  Fax: [phone]      July 03, 2009 MRN: [id number]   DONDA FRIEDLI [address] [place], [place]  [address]   Dear Ms. Caryn Section,   According to your medical record, it is time for you to schedule a Colonoscopy. The American Cancer Society recommends this procedure as a method to detect early colon cancer. Patients with a family history of colon cancer, or a personal history of colon polyps or inflammatory bowel disease are at increased risk.  This letter has beeen generated based on the recommendations made at the time of your procedure. If you feel that in your particular situation this may no longer apply, please contact our office.  Please call our office at [phone] to schedule this appointment or to update your records at your earliest convenience.  Thank you for cooperating with [place] to provide you with the very best care possible.   Sincerely,  Wilhemina Bonito. Marina Goodell, M.D.  [facility] [phone]

## 2010-11-10 NOTE — Assessment & Plan Note (Signed)
Summary: DIARRHEA / SURVEILLANCE COLONOSCOPY   History of Present Illness Visit Type: follow up  Primary GI MD: Yancey Flemings MD Primary Provider: Carolin Coy, MD Requesting Provider: n/a Chief Complaint: Recall colon. Pt states that she has been having diarrhea History of Present Illness:   65 year old female with multiple medical problems including diabetes mellitus, hypertension, hyperlipidemia, gastroesophageal reflux disease with peptic stricture, adenomatous colon polyps, and multiple drug allergies. She presents today regarding change in bowel habits and surveillance colonoscopy. 2 weeks ago she developed diarrhea that lasted for about 5 days. She became dehydrated and had to go to the hospital for IV fluids. Laboratories and urinalysis were unremarkable. Stool studies negative. Her symptoms have improved. Her bowel habits have not quite normalized but have improved. No bleeding. No nausea vomiting. No weight loss. She takes multiple agents for her diabetes. Terms of reflux, she is on Nexium 40 mg daily. This controls his symptoms. She has had no recurrent dysphasia.   GI Review of Systems    Reports acid reflux.      Denies abdominal pain, belching, bloating, chest pain, dysphagia with liquids, dysphagia with solids, heartburn, loss of appetite, nausea, vomiting, vomiting blood, weight loss, and  weight gain.      Reports change in bowel habits and  diarrhea.     Denies anal fissure, black tarry stools, constipation, diverticulosis, fecal incontinence, heme positive stool, hemorrhoids, irritable bowel syndrome, jaundice, light color stool, liver problems, rectal bleeding, and  rectal pain.    Current Medications (verified): 1)  Nexium 40 Mg Cpdr (Esomeprazole Magnesium) .Marland Kitchen.. 1 Capsule By Mouth Once Daily 2)  Premarin 0.625 Mg Tabs (Estrogens Conjugated) .Marland Kitchen.. 1 Tablet By Mouth Once Daily 3)  Actos 30 Mg Tabs (Pioglitazone Hcl) .Marland Kitchen.. 1 Tablet By Mouth Once Daily 4)  Altace 5 Mg Tabs  (Ramipril) .... 3  Tablet By Mouth Once Daily 5)  Cartia Xt 240 Mg Xr24h-Cap (Diltiazem Hcl Coated Beads) .Marland Kitchen.. 1 Capsule By Mouth Once Daily 6)  Metformin Hcl 500 Mg Tabs (Metformin Hcl) .... Take 2 Tablets By Mouth Two Times A Day 7)  Hydrochlorothiazide 25 Mg Tabs (Hydrochlorothiazide) .... Take 1 Tablet By Mouth Once A Day 8)  Lovaza 1 Gm Caps (Omega-3-Acid Ethyl Esters) .... Take 2 Tablets By Mouth Two Times A Day 9)  Hyoscyamine Sulfate 0.125 Mg Subl (Hyoscyamine Sulfate) .Marland Kitchen.. 1-2 By Mouth Q 4 Hours As Needed Pain 10)  Proventil Hfa 108 (90 Base) Mcg/act Aers (Albuterol Sulfate) .Marland Kitchen.. 1 Puff Four Times A Day As Needed 11)  Januvia 100 Mg Tabs (Sitagliptin Phosphate) .... Take 1 Tablet By Mouth Once A Day 12)  Lescol 20 Mg Caps (Fluvastatin Sodium) .... Take 1 Tablet By Mouth Once A Day in The Evening 13)  Glipizide Xl 10 Mg Xr24h-Tab (Glipizide) .... Take 1 Tablet By Mouth Once A Day  Allergies (verified): 1)  Pamelor 2)  Toprol Xl (Metoprolol Succinate) 3)  Codeine 4)  Flagyl 5)  Trovan 6)  Beta Blockers 7)  Bactrim 8)  Welchol 9)  Lescol 10)  Keflex 11)  Crestor 12)  Prinivil  Past History:  Past Medical History: Reviewed history from 12/26/2008 and no changes required. Current Problems:  ESOPHAGEAL STRICTURE (ICD-530.3) HEMORRHOIDS, INTERNAL (ICD-455.0) DIVERTICULOSIS, COLON (ICD-562.10) ADENOMATOUS COLONIC POLYP (ICD-211.3) Diabetes Hypertension  Past Surgical History: Reviewed history from 12/26/2008 and no changes required. Cholecystectomy Hysterectomy Knee Surgery Rt  Family History: Reviewed history from 12/26/2008 and no changes required. No FH of Colon Cancer: Family History of Ovarian  Cancer: Daughter Family History of Diabetes: MGM, Brother ?  Social History: Reviewed history from 12/26/2008 and no changes required. Married, 2 girls, 1 boy Patient has never smoked.  Alcohol Use - no Daily Caffeine Use 1 coke/day Illicit Drug Use - no  Review of  Systems       occasional joint aches, headaches. All other systems reviewed and reported as negative.  Vital Signs:  Patient profile:   65 year old female Height:      67 inches Weight:      213 pounds BMI:     33.48 BSA:     2.08 Pulse rate:   96 / minute Pulse rhythm:   regular BP sitting:   128 / 80  (left arm) Cuff size:   regular  Vitals Entered By: Ok Anis CMA (August 06, 2009 8:46 AM)  Physical Exam  General:  Well developed, well nourished, no acute distress. Head:  Normocephalic and atraumatic. Eyes:  PERRLA, no icterus. Nose:  No deformity, discharge,  or lesions. Mouth:  No deformity or lesions,  Neck:  Supple; no masses or thyromegaly. Lungs:  Clear throughout to auscultation. Heart:  Regular rate and rhythm; no murmurs, rubs,  or bruits. Abdomen:  Soft, nontender and nondistended. No masses, hepatosplenomegaly or hernias noted. Normal bowel sounds. Rectal:  deferred Msk:  Symmetrical with no gross deformities. Normal posture. Pulses:  Normal pulses noted. Extremities:  No clubbing, cyanosis, edema or deformities noted. Neurologic:  Alert and  oriented x4;  grossly normal neurologically. Skin:  Intact without significant lesions or rashes. Psych:  Alert and cooperative. Normal mood and affect.   Impression & Recommendations:  Problem # 1:  DIARRHEA-PRESUMED INFECTIOUS (ICD-009.3) the patient presents today with acute diarrheal illness associated with dehydration. She required IV fluid resuscitation. Laboratory workup was negative. She most likely suffered from an acute infectious gastroenteritis, likely viral. Slowly improving. At this point supportive care and expectant management are appropriate.  Problem # 2:  PERSONAL HX COLONIC POLYPS (ICD-V12.72) personal history of adenomatous colon polyps. Multiple prior colonoscopies. Last colonoscopy in 2007 with Dr. Corinda Gubler. Followup in 3 years recommended. The patient is an appropriate candidate without  contraindication. The nature of colonoscopy as well as the risks, benefits, and alternatives were reviewed carefully. She understood and agreed to proceed. Movi prep prescribed. The patient instructed on its use. Also, she will be instructed to hold her oral diabetic medications the day of the examination until she has resume normal p.o. intake. This in order to avoid unwanted hypoglycemia.  Problem # 3:  GERD (ICD-530.81) good control of GERD symptoms with Nexium. No recurrent dysphagia due to known peptic stricture of the esophagus.  Plan: #1. Continue Nexium 40 mg daily #2. Reflux precautions #3. Repeat endoscopy p.r.n. recurrent dysphagia  Problem # 4:  AODM (ICD-250.00) need to adjust diabetic medications the day of her colonoscopy. See discussion above regarding this.  Other Orders: Colonoscopy (Colon)  Patient Instructions: 1)  Colon [facility] 08/14/09 9:30 am. 2)  Colonoscopy and Flexible Sigmoidoscopy brochure given.  3)  Movi prep instructions given to patient. 4)  Movi prep Rx. sent to pharmacy. 5)  Hold DM oral meds morning of procedure. 6)  The medication list was reviewed and reconciled.  All changed / newly prescribed medications were explained.  A complete medication list was provided to the patient / caregiver. 7)  Copy: Dr. Hall Busing Prescriptions: MOVIPREP 100 GM  SOLR (PEG-KCL-NACL-NASULF-NA ASC-C) As per prep instructions.  #1 x 0  Entered by:   Milford Cage NCMA   Authorized by:   Hilarie Fredrickson MD   Signed by:   Milford Cage NCMA on 08/06/2009   Method used:   Electronically to        [facility]. [phone]* (retail)       [address]       [place], [place]  [address]       Ph: [phone] or [phone]       Fax: [phone]   RxID:   [id number]

## 2010-11-10 NOTE — Procedures (Signed)
Summary: [employer]   Imported By: Sherian Rein 08/08/2009 09:19:28  _____________________________________________________________________  External Attachment:    Type:   Image     Comment:   External Document

## 2010-11-10 NOTE — Progress Notes (Signed)
Summary: GI Dietitian  GI Office Note/[facility]   Imported By: Sherian Rein 08/08/2009 09:25:15  _____________________________________________________________________  External Attachment:    Type:   Image     Comment:   External Document

## 2010-11-10 NOTE — Assessment & Plan Note (Signed)
Summary: consult for Alice Cole   Visit Type:  Initial Consult Copy to:  Armanda Magic MD Primary Provider/Referring Provider:  Carolin Coy, MD  CC:  Pulmonary Consult.  History of Present Illness: The pt is a 64y/o female who I have been asked to see for dyspnea.  The pt states that she has had breathing issues for about 3-43mos,  but at least she does not feel they are getting worse.  She notices sob with walking to her mailbox, bringing groceries in from the car, and with long conversations.  She describes a one block Alice Cole at moderate pace on flat ground.  She has had a cough for about 6mos, but it is dry and hacky, and the pt is taking an ACE.  She denies any h/o asthma, and has never smoked.  She denies any significant weight gain the past one year, but does have centripetal obesity.  She has had intermittant LE edema, but no pleuritic chest pain.  She tells me that recent bloodwork has been unremarkable.  She has had a recent cxr with no acute process.  She had an echo in Feb that did not show LV dysfxn, nor did she have pulmonary htn.  She has had PFT's last month, but unfortunately had a poor test performance, and was unable to blow the required 6sec for valid test results.  Her flow volume loop did appear to be normal.  Her DLCO was minimally reduced.    Current Medications (verified): 1)  Nexium 40 Mg Cpdr (Esomeprazole Magnesium) .Marland Kitchen.. 1 Capsule By Mouth Once Daily 2)  Premarin 0.625 Mg Tabs (Estrogens Conjugated) .Marland Kitchen.. 1 Tablet By Mouth Once Daily 3)  Actos 30 Mg Tabs (Pioglitazone Hcl) .Marland Kitchen.. 1 Tablet By Mouth Once Daily 4)  Altace 5 Mg Tabs (Ramipril) .... 3  Tablet By Mouth Once Daily 5)  Cartia Xt 240 Mg Xr24h-Cap (Diltiazem Hcl Coated Beads) .Marland Kitchen.. 1 Capsule By Mouth Once Daily 6)  Metformin Hcl 500 Mg Tabs (Metformin Hcl) .... Take 2 Tablets By Mouth Two Times A Day 7)  Hydrochlorothiazide 25 Mg Tabs (Hydrochlorothiazide) .... Take 1 Tablet By Mouth Once A Day 8)  Lovaza 1 Gm Caps  (Omega-3-Acid Ethyl Esters) .... Take 2 Tablets By Mouth Two Times A Day 9)  Hyoscyamine Sulfate 0.125 Mg Subl (Hyoscyamine Sulfate) .Marland Kitchen.. 1-2 By Mouth Q 4 Hours As Needed Pain 10)  Lescol 20 Mg Caps (Fluvastatin Sodium) .... Take 1 Tablet By Mouth Once A Day in The Evening 11)  Glipizide Xl 10 Mg Xr24h-Tab (Glipizide) .... Take 1 Tablet By Mouth Once A Day 12)  Xanax 0.25 Mg Tabs (Alprazolam) .... One Tab Once Daily As Needed 13)  Aspirin 325 Mg Tabs (Aspirin) .... Once Daily  Allergies (verified): 1)  Pamelor 2)  Toprol Xl (Metoprolol Succinate) 3)  Codeine 4)  Flagyl 5)  Trovan 6)  Beta Blockers 7)  Bactrim 8)  Welchol 9)  Lescol 10)  Keflex 11)  Crestor 12)  Prinivil  Past History:  Past Medical History: OSA--on cpap. BIGEMINY (ICD-427.89) MIXED HYPERLIPIDEMIA (ICD-272.2) DIARRHEA-PRESUMED INFECTIOUS (ICD-009.3) AODM (ICD-250.00) PERSONAL HX COLONIC POLYPS (ICD-V12.72) GERD (ICD-530.81) ESOPHAGEAL STRICTURE (ICD-530.3) HEMORRHOIDS, INTERNAL (ICD-455.0) DIVERTICULOSIS, COLON (ICD-562.10) ADENOMATOUS COLONIC POLYP (ICD-211.3)    Past Surgical History: Reviewed history from 12/26/2008 and no changes required. Cholecystectomy Hysterectomy Knee Surgery Rt  Family History: Reviewed history from 12/26/2008 and no changes required. Family History of Ovarian Cancer: Daughter Family History of Diabetes: MGM, Brother ? Heart disease--mother,brother,mgm,mgf  Social History: Reviewed history from  12/26/2008 and no changes required. 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  Alcohol Use - no Daily Caffeine Use 1 coke/day Illicit Drug Use - no  Review of Systems       The patient complains of shortness of breath with activity, shortness of breath at rest, productive cough, irregular heartbeats, indigestion, difficulty swallowing, anxiety, and depression.  The patient denies non-productive cough, coughing up  blood, chest pain, acid heartburn, loss of appetite, weight change, abdominal pain, sore throat, tooth/dental problems, headaches, nasal congestion/difficulty breathing through nose, sneezing, itching, ear ache, hand/feet swelling, joint stiffness or pain, rash, change in color of mucus, and fever.    Vital Signs:  Patient profile:   65 year old female Height:      67 inches Weight:      210.13 pounds BMI:     33.03 O2 Sat:      96 % on Room air Temp:     97.6 degrees F oral Pulse rate:   103 / minute BP sitting:   158 / 80  (left arm) Cuff size:   large  Vitals Entered By: Carver Fila (June 26, 2010 11:27 AM)  O2 Flow:  Room air  Physical Exam  General:  obese female in nad Eyes:  PERRLA and EOMI.   Nose:  deviated septum to left with near total obstruction right narrowed with turb. hypertrophy Mouth:  mild elongation of soft palate, nl uvula Neck:  no jvd, tmg, LN Lungs:  totally clear to auscultation Heart:  rrr, no mrg Abdomen:  soft and nontender, bs+ Extremities:  no significant edema, pulses intact distally no cyanosis Neurologic:  alert and oriented, moves all 4.   Impression & Recommendations:  Problem # 1:  DYSPNEA ON EXERTION (ICD-786.09) the pt has Alice Cole that I suspect is related to obesity and deconditioning.  Her physical exam, normal cxr, and normal appearing flow volume loop on recent pfts suggest that she does not have significant underlying lung pathology, though I think we should attempt to repeat her pfts to see if we can get usable data.  She does have centripetal obesity that I think can result in diaphragm limitation, and therefore "restriction".  Will go ahead and schedule for repeat full pfts, and see her back the same day to discuss.  I have encouraged her to work on aggressive weight loss and some type of conditioning program.  Medications Added to Medication List This Visit: 1)  Xanax 0.25 Mg Tabs (Alprazolam) .... One tab once daily as  needed 2)  Aspirin 325 Mg Tabs (Aspirin) .... Once daily  Other Orders: Consultation Level IV (16109) Pulmonary Referral (Pulmonary)  Patient Instructions: 1)  will try to do breathing tests again, and see you back on same day to review. 2)  work on weight loss and exercise program 3)  please discuss with Dr. Gerri Spore a trial off your altace for 4-8 weeks to see if your cough improves.   Immunization History:  Influenza Immunization History:    Influenza:  historical (07/11/2009)

## 2010-11-10 NOTE — Progress Notes (Signed)
Summary: [facility]   Imported By: Sherian Rein 08/08/2009 09:22:38  _____________________________________________________________________  External Attachment:    Type:   Image     Comment:   External Document

## 2010-11-11 ENCOUNTER — Encounter (INDEPENDENT_AMBULATORY_CARE_PROVIDER_SITE_OTHER): Payer: 59

## 2010-11-11 DIAGNOSIS — R002 Palpitations: Secondary | ICD-10-CM

## 2010-11-12 NOTE — Assessment & Plan Note (Signed)
Summary: pvc's/mt   Visit Type:  Follow-up Referring Provider:  Armanda Magic MD Primary Provider:  Carolin Coy, MD   History of Present Illness: The patient presents today for electrophysiology followup of symptomatic PVCs.  I previously saw her during her hospitalization 2/11.  She has longstanding PVCs with symptoms of "heart  pounding".   She has not tolerating toprol or coreg previously due to bradycardia.  She also has not tolerated verapamil due to nausea.  She has been able to take cardizem 240mg  daily previously but continues to have PVCs.  She also has a very elevated BP frequently. She states that over the past few weeks, she has had increasing palpitations.  She states that episodes occur every few days and last approximately 30 minutes at a time.  She reports fatigue with episodes.   She  denies symptoms of palpitations, chest pain, shortness of breath, orthopnea, PND, lower extremity edema, dizziness, presyncope, syncope, or neurologic sequela. The patient is tolerating medications without difficulties and is otherwise without complaint today.   Current Medications (verified): 1)  Nexium 40 Mg Cpdr (Esomeprazole Magnesium) .Marland Kitchen.. 1 Capsule By Mouth Once Daily 2)  Premarin 0.625 Mg Tabs (Estrogens Conjugated) .Marland Kitchen.. 1 Tablet By Mouth Once Daily 3)  Actos 30 Mg Tabs (Pioglitazone Hcl) .Marland Kitchen.. 1 Tablet By Mouth Once Daily 4)  Altace 10 Mg Caps (Ramipril) .... One Tablet Two Times A Day 5)  Cartia Xt 240 Mg Xr24h-Cap (Diltiazem Hcl Coated Beads) .Marland Kitchen.. 1 Capsule By Mouth Once Daily 6)  Metformin Hcl 500 Mg Tabs (Metformin Hcl) .... Take 2 Tablets By Mouth Two Times A Day 7)  Hydrochlorothiazide 25 Mg Tabs (Hydrochlorothiazide) .... Take 1/2 Tablet By Mouth Once A Day 8)  Lovaza 1 Gm Caps (Omega-3-Acid Ethyl Esters) .... Take 2 Tablets By Mouth Two Times A Day 9)  Hyoscyamine Sulfate 0.125 Mg Subl (Hyoscyamine Sulfate) .Marland Kitchen.. 1-2 By Mouth Q 4 Hours As Needed Pain 10)  Lescol 20 Mg Caps  (Fluvastatin Sodium) .... Take 1 Tablet By Mouth Once A Day in The Evening 11)  Glipizide Xl 10 Mg Xr24h-Tab (Glipizide) .... Take 1 Tablet By Mouth Once A Day 12)  Xanax 0.25 Mg Tabs (Alprazolam) .... One Tab Once Daily As Needed 13)  Aspirin 325 Mg Tabs (Aspirin) .... Once Daily 14)  Carafate 1 Gm Tabs (Sucralfate) .... Two Times A Day 15)  Nitrostat 0.4 Mg Subl (Nitroglycerin) .Marland Kitchen.. 1 Tablet Under Tongue At Onset of Chest Pain; You May Repeat Every 5 Minutes For Up To 3 Doses. 16)  Diltiazem Hcl Er Beads 240 Mg Xr24h-Cap (Diltiazem Hcl Er Beads) .... Take One Capsule By Mouth Daily 17)  Flonase 50 Mcg/act Susp (Fluticasone Propionate) .... Uad  Allergies (verified): 1)  Pamelor 2)  Toprol Xl (Metoprolol Succinate) 3)  Codeine 4)  Flagyl 5)  Trovan 6)  Beta Blockers 7)  Bactrim 8)  Welchol 9)  Lescol 10)  Keflex 11)  Crestor 12)  Prinivil  Past History:  Past Medical History: OSA--on cpap. Frequent PVCs Hypertension MIXED HYPERLIPIDEMIA (ICD-272.2) AODM (ICD-250.00) GERD (ICD-530.81) ESOPHAGEAL STRICTURE (ICD-530.3) HEMORRHOIDS, INTERNAL (ICD-455.0) DIVERTICULOSIS, COLON (ICD-562.10) ADENOMATOUS COLONIC POLYP (ICD-211.3)    Past Surgical History: Reviewed history from 12/26/2008 and no changes required. Cholecystectomy Hysterectomy Knee Surgery Rt  Family History: Family History of Ovarian Cancer: Daughter Family History of Diabetes: MGM, Brother ? Heart disease--mother,brother,mgm,mgf   + DM and CAD  Social History: Married, 2 girls, 1 boy She lives in [place] occupation--retired--cig factory worker x 30 years  Patient has never smoked.  Alcohol Use - no Daily Caffeine Use 1 coke/day Illicit Drug Use - no  Review of Systems       All systems are reviewed and negative except as listed in the HPI.   Vital Signs:  Patient profile:   65 year old female Height:      67 inches Weight:      201 pounds BMI:     31.59 Pulse rate:   99 / minute BP  sitting:   170 / 80  (left arm)  Vitals Entered By: Laurance Flatten CMA (November 02, 2010 9:50 AM)  Physical Exam  General:  obese female in nad Head:  normocephalic and atraumatic Eyes:  PERRLA/EOM intact; conjunctiva and lids normal. Mouth:  Teeth, gums and palate normal. Oral mucosa normal. Neck:  supple Lungs:  Clear bilaterally to auscultation and percussion. Heart:  RRR, no m/r/g Abdomen:  Bowel sounds positive; abdomen soft and non-tender without masses, organomegaly, or hernias noted. No hepatosplenomegaly. Msk:  Back normal, normal gait. Muscle strength and tone normal. Extremities:  No clubbing or cyanosis. Neurologic:  Alert and oriented x 3. Skin:  Intact without lesions or rashes. Psych:  Normal affect.   Echocardiogram  Procedure date:  11/26/2009  Findings:       Study Conclusions   Left ventricle: The cavity size was normal. Systolic function was   normal. The estimated ejection fraction was in the range of 60% to   65%.    Transthoracic echocardiography. M-mode, complete 2D,   spectral Doppler, and color Doppler. Height: Height: 170.2cm.   Height: 67in. Weight: Weight: 96.4kg. Weight: 212lb. Body mass   index: BMI: 33.3kg/m^2. Body surface area: BSA: 2.85m^2. Patient   status: Inpatient. Location: Echo laboratory.  Left atrium                        Peak              3 mm Hg -------   AP dim           26 mm     ------  gradient, D   AP dim index    1.2 cm/m^2 <2.2    Peak E/A        0.8       -------                                      ratio    --------------------------------------------------------------------   Prepared and Electronically Authenticated by    Everette Rank, MD   [id number]   EKG  Procedure date:  11/02/2010  Findings:      sinus rhythm 99 bpm, PR 170, QRS 82, Qtc 438 LAD  EKG  Procedure date:  10/20/2010  Findings:      sinus with PVCs in bigeminy.  PVCs are of a RBB inferior axis morophology  Impression &  Recommendations:  Problem # 1:  BIGEMINY (ICD-427.89) The patient has a h/o PVCs in bigeminy with symptoms of palpitations.  Today, she does not have any pvcs in the office.  Medical therapy has been previously difficult due to intolerance of mutiple medicines including toprol, coreg, and verapamil. I suspect that the etiology of her PVCs are her poorly controlled HTN.  I therefore suspect that when BP is most elevated, PVCs will be worse and when BP is improved, PVCs will also  improve.   I do not think that we will be able to control PVCs without good BP control. I will therefore ask Dr Mayford Knife to aggressively control HTN. We will place a 48 hour monitor to better characterize the frequency of her PVCs. We will also add nadolol 10mg  daily at this time. She will return in 6 weeks. I would reserve further treatment with AAD (ex flecainide) for PVCs not controlled with nadolol after BP control has been established.  Problem # 2:  ESSENTIAL HYPERTENSION, BENIGN (ICD-401.1) very elevated BP likely the cause for PVCs aggresssive BP control is necessary we will add low dose nadolol today which can be titrated consider addition of spironolactone for BP control  Problem # 3:  MIXED HYPERLIPIDEMIA (ICD-272.2) stable Her updated medication list for this problem includes:    Lovaza 1 Gm Caps (Omega-3-acid ethyl esters) .Marland Kitchen... Take 2 tablets by mouth two times a day    Lescol 20 Mg Caps (Fluvastatin sodium) .Marland Kitchen... Take 1 tablet by mouth once a day in the evening  Other Orders: Holter Monitor (Holter Monitor)  Patient Instructions: 1)  Your physician recommends that you schedule a follow-up appointment in: 6 weeks with Dr Johney Frame 2)  Your physician has recommended you make the following change in your medication: start Nadolol 10mg  daily 3)  Your physician has recommended that you wear a holter monitor.  Holter monitors are medical devices that record the heart's electrical activity. Doctors most often  use these monitors to diagnose arrhythmias. Arrhythmias are problems with the speed or rhythm of the heartbeat. The monitor is a small, portable device. You can wear one while you do your normal daily activities. This is usually used to diagnose what is causing palpitations/syncope (passing out). Prescriptions: NADOLOL 20 MG TABS (NADOLOL) 1/2 tablet daily  #30 x 6   Entered by:   Dennis Bast, RN, BSN   Authorized by:   Hillis Range, MD   Signed by:   Dennis Bast, RN, BSN on 11/02/2010   Method used:   Electronically to        [facility] [phone]* (retail)       [address]       [place], [place]  [address]       Ph: [phone] or [phone]       Fax: [phone]   RxID:   [id number]

## 2010-11-26 NOTE — Letter (Signed)
Summary: [facility] 2009-2012  [facility] 2009-2012   Imported By: Marylou Mccoy 11/19/2010 14:45:25  _____________________________________________________________________  External Attachment:    Type:   Image     Comment:   External Document

## 2010-12-03 ENCOUNTER — Encounter: Payer: Self-pay | Admitting: Internal Medicine

## 2010-12-09 ENCOUNTER — Ambulatory Visit (INDEPENDENT_AMBULATORY_CARE_PROVIDER_SITE_OTHER): Payer: 59 | Admitting: Internal Medicine

## 2010-12-09 ENCOUNTER — Encounter: Payer: Self-pay | Admitting: Internal Medicine

## 2010-12-09 DIAGNOSIS — I4949 Other premature depolarization: Secondary | ICD-10-CM

## 2010-12-09 DIAGNOSIS — I10 Essential (primary) hypertension: Secondary | ICD-10-CM

## 2010-12-17 NOTE — Assessment & Plan Note (Signed)
Summary: f6w holter montior/sl   Visit Type:  Follow-up Referring Provider:  Armanda Magic MD Primary Provider:  Carolin Coy, MD   History of Present Illness: The patient presents today for electrophysiology followup of symptomatic PVCs.  I previously saw her during her hospitalization 2/11.  She has longstanding PVCs with symptoms of "heart  pounding", most prominent when her blood pressure is elevated.   She has not tolerating toprol or coreg previously due to bradycardia.  She also has not tolerated verapamil due to nausea.  She has been able to take cardizem 240mg  daily previously but continues to have PVCs.  She also has a very elevated BP frequently. SInce last being seen by me, she did not start nadolol.  She continues to have rare palpitations and finds that her systolic blood pressure during these episodes is 200 mm Hg.   She reports fatigue with episodes.   She  denies symptoms of palpitations, chest pain, shortness of breath, orthopnea, PND, lower extremity edema, dizziness, presyncope, syncope, or neurologic sequela. The patient is tolerating medications without difficulties and is otherwise without complaint today.   Problems Prior to Update: 1)  Essential Hypertension, Benign  (ICD-401.1) 2)  Dyspnea On Exertion  (ICD-786.09) 3)  Bigeminy  (ICD-427.89) 4)  Mixed Hyperlipidemia  (ICD-272.2) 5)  Diarrhea-presumed Infectious  (ICD-009.3) 6)  Aodm  (ICD-250.00) 7)  Personal Hx Colonic Polyps  (ICD-V12.72) 8)  Abdominal Pain-epigastric  (ICD-789.06) 9)  Abdominal Pain, Generalized  (ICD-789.07) 10)  Gerd  (ICD-530.81) 11)  Esophageal Stricture  (ICD-530.3) 12)  Hemorrhoids, Internal  (ICD-455.0) 13)  Diverticulosis, Colon  (ICD-562.10) 14)  Adenomatous Colonic Polyp  (ICD-211.3)  Current Medications (verified): 1)  Nexium 40 Mg Cpdr (Esomeprazole Magnesium) .Marland Kitchen.. 1 Capsule By Mouth Once Daily 2)  Premarin 0.625 Mg Tabs (Estrogens Conjugated) .Marland Kitchen.. 1 Tablet By Mouth Once  Daily 3)  Altace 10 Mg Caps (Ramipril) .... One Tablet Two Times A Day 4)  Cartia Xt 240 Mg Xr24h-Cap (Diltiazem Hcl Coated Beads) .Marland Kitchen.. 1 Capsule By Mouth Once Daily 5)  Metformin Hcl 500 Mg Tabs (Metformin Hcl) .... Take 2 Tablets By Mouth Two Times A Day 6)  Hydrochlorothiazide 25 Mg Tabs (Hydrochlorothiazide) .... Take 1/2 Tablet By Mouth Once A Day 7)  Lovaza 1 Gm Caps (Omega-3-Acid Ethyl Esters) .... Take 2 Tablets By Mouth Two Times A Day 8)  Lescol 20 Mg Caps (Fluvastatin Sodium) .... Take 1 Tablet By Mouth Once A Day in The Evening 9)  Glipizide 2.5 Mg Xr24h-Tab (Glipizide) .Marland Kitchen.. 1 Tablet Twice Daily 10)  Xanax 0.25 Mg Tabs (Alprazolam) .... One Tab Once Daily As Needed 11)  Aspirin 325 Mg Tabs (Aspirin) .... Once Daily 12)  Carafate 1 Gm Tabs (Sucralfate) .... Two Times A Day 13)  Nitrostat 0.4 Mg Subl (Nitroglycerin) .Marland Kitchen.. 1 Tablet Under Tongue At Onset of Chest Pain; You May Repeat Every 5 Minutes For Up To 3 Doses. 14)  Diltiazem Hcl Er Beads 240 Mg Xr24h-Cap (Diltiazem Hcl Er Beads) .... Take One Capsule By Mouth Daily 15)  Flonase 50 Mcg/act Susp (Fluticasone Propionate) .... Uad 16)  Nadolol 20 Mg Tabs (Nadolol) .... 1/2 Tablet Daily  Allergies (verified): 1)  Pamelor 2)  Toprol Xl (Metoprolol Succinate) 3)  Codeine 4)  Flagyl 5)  Trovan 6)  Beta Blockers 7)  Bactrim 8)  Welchol 9)  Lescol 10)  Keflex 11)  Crestor 12)  Prinivil  Past History:  Past Medical History: Reviewed history from 11/02/2010 and no changes required. OSA--on  cpap. Frequent PVCs Hypertension MIXED HYPERLIPIDEMIA (ICD-272.2) AODM (ICD-250.00) GERD (ICD-530.81) ESOPHAGEAL STRICTURE (ICD-530.3) HEMORRHOIDS, INTERNAL (ICD-455.0) DIVERTICULOSIS, COLON (ICD-562.10) ADENOMATOUS COLONIC POLYP (ICD-211.3)    Past Surgical History: Reviewed history from 12/26/2008 and no changes required. Cholecystectomy Hysterectomy Knee Surgery Rt  Social History: Reviewed history from 11/02/2010 and no  changes required. 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  Alcohol Use - no Daily Caffeine Use 1 coke/day Illicit Drug Use - no  Vital Signs:  Patient profile:   65 year old female Height:      67 inches Weight:      201.75 pounds BMI:     31.71 Pulse rate:   92 / minute BP sitting:   162 / 90  (left arm) Cuff size:   regular  Vitals Entered By: Micki Riley CNA (December 09, 2010 10:13 AM)  Physical Exam  General:  obese female in nad Head:  normocephalic and atraumatic Eyes:  PERRLA/EOM intact; conjunctiva and lids normal. Mouth:  Teeth, gums and palate normal. Oral mucosa normal. Neck:  supple Lungs:  Clear bilaterally to auscultation and percussion. Heart:  RRR, no m/r/g Abdomen:  Bowel sounds positive; abdomen soft and non-tender without masses, organomegaly, or hernias noted. No hepatosplenomegaly. Msk:  Back normal, normal gait. Muscle strength and tone normal. Extremities:  No clubbing or cyanosis. Neurologic:  Alert and oriented x 3. Skin:  Intact without lesions or rashes. Psych:  Normal affect.   Impression & Recommendations:  Problem # 1:  BIGEMINY (ICD-427.89) The patient has a h/o PVCs in bigeminy with symptoms of palpitations.   I had her wear a 48 hour holter 11/13/10.  This documented sinus rhythm with PVCs <1% of the time.  When she did have PVCs, they occured at times in bigeminy.  She has had nocturnal MObitz I second degree AV block with junctional rhythm also observed.  Medical therapy has been previously difficult due to intolerance of mutiple medicines including toprol, coreg, and verapamil. I recommended that she try nadolol but did not do so. I suspect that her "low heart rate" during PVCs is an artifactual finding related to poor ventricular contaction during PVCs.  Her heart rate should be evaluated by EKG if concerns for "low heart rate" occur during  PVCs. Clearly, the most important part of our therapy should be blood pressure control.  If can control her BP, I think that the PVCs will significantly improve.  She is not a candidate for ablation due to the relative infrequency of PVCs.  I would also not advise AAD at this time.  I will therefore ask Dr Mayford Knife to aggressively control HTN. She will also add nadolol 10mg  daily at this time and contact my office if problems arise. She will return in 6 months.  Problem # 2:  ESSENTIAL HYPERTENSION, BENIGN (ICD-401.1) above goal add nadolol follow-up with Dr Mayford Knife for aggressive BP control.  Spironolactone may be an effective agent to consider. salt restriciton and avoidance of NSAIDs/ decongestants/ stimulants was advised  Problem # 3:  MIXED HYPERLIPIDEMIA (ICD-272.2) stable no changes  Her updated medication list for this problem includes:    Lovaza 1 Gm Caps (Omega-3-acid ethyl esters) .Marland Kitchen... Take 2 tablets by mouth two times a day    Lescol 20 Mg Caps (Fluvastatin sodium) .Marland Kitchen... Take 1 tablet by mouth once a day in the evening  Patient Instructions: 1)  Your physician wants you to follow-up in: 6  months with Dr Jacquiline Doe will receive a reminder letter in the mail two months in advance. If you don't receive a letter, please call our office to schedule the follow-up appointment. 2)  Your physician has recommended you make the following change in your medication: restart Nadolol 20mg  1/2 daily

## 2010-12-22 LAB — CBC
HCT: 35.2 % — ABNORMAL LOW (ref 36.0–46.0)
HCT: 38.9 % (ref 36.0–46.0)
Hemoglobin: 12.1 g/dL (ref 12.0–15.0)
Hemoglobin: 13.2 g/dL (ref 12.0–15.0)
MCH: 29.7 pg (ref 26.0–34.0)
MCH: 30.3 pg (ref 26.0–34.0)
MCHC: 33.9 g/dL (ref 30.0–36.0)
MCHC: 34.4 g/dL (ref 30.0–36.0)
MCV: 87.4 fL (ref 78.0–100.0)
MCV: 88 fL (ref 78.0–100.0)
Platelets: 202 10*3/uL (ref 150–400)
Platelets: 220 10*3/uL (ref 150–400)
RBC: 4 MIL/uL (ref 3.87–5.11)
RBC: 4.45 MIL/uL (ref 3.87–5.11)
RDW: 12.1 % (ref 11.5–15.5)
RDW: 12.1 % (ref 11.5–15.5)
WBC: 8.7 10*3/uL (ref 4.0–10.5)
WBC: 8.9 10*3/uL (ref 4.0–10.5)

## 2010-12-22 LAB — COMPREHENSIVE METABOLIC PANEL
ALT: 61 U/L — ABNORMAL HIGH (ref 0–35)
ALT: 68 U/L — ABNORMAL HIGH (ref 0–35)
AST: 60 U/L — ABNORMAL HIGH (ref 0–37)
AST: 76 U/L — ABNORMAL HIGH (ref 0–37)
Albumin: 3.5 g/dL (ref 3.5–5.2)
Albumin: 4 g/dL (ref 3.5–5.2)
Alkaline Phosphatase: 50 U/L (ref 39–117)
Alkaline Phosphatase: 53 U/L (ref 39–117)
BUN: 5 mg/dL — ABNORMAL LOW (ref 6–23)
BUN: 7 mg/dL (ref 6–23)
CO2: 26 mEq/L (ref 19–32)
CO2: 29 mEq/L (ref 19–32)
Calcium: 9.4 mg/dL (ref 8.4–10.5)
Calcium: 9.6 mg/dL (ref 8.4–10.5)
Chloride: 100 mEq/L (ref 96–112)
Chloride: 98 mEq/L (ref 96–112)
Creatinine, Ser: 0.86 mg/dL (ref 0.4–1.2)
Creatinine, Ser: 0.86 mg/dL (ref 0.4–1.2)
GFR calc Af Amer: >60 mL/min (ref >60)
GFR calc Af Amer: >60 mL/min (ref >60)
GFR calc non Af Amer: >60 mL/min (ref >60)
GFR calc non Af Amer: >60 mL/min (ref >60)
Glucose, Bld: 244 mg/dL — ABNORMAL HIGH (ref 70–99)
Glucose, Bld: 262 mg/dL — ABNORMAL HIGH (ref 70–99)
Potassium: 3.5 mEq/L (ref 3.5–5.1)
Potassium: 3.6 mEq/L (ref 3.5–5.1)
Sodium: 134 mEq/L — ABNORMAL LOW (ref 135–145)
Sodium: 141 mEq/L (ref 135–145)
Total Bilirubin: 0.5 mg/dL (ref 0.3–1.2)
Total Bilirubin: 0.5 mg/dL (ref 0.3–1.2)
Total Protein: 6.4 g/dL (ref 6.0–8.3)
Total Protein: 6.9 g/dL (ref 6.0–8.3)

## 2010-12-22 LAB — HEPATITIS PANEL, ACUTE
HCV Ab: NEGATIVE
Hep A IgM: NEGATIVE
Hep B C IgM: NEGATIVE
Hepatitis B Surface Ag: NEGATIVE

## 2010-12-22 LAB — POCT CARDIAC MARKERS
CKMB, poc: 1.2 ng/mL (ref 1.0–8.0)
Myoglobin, poc: 69.7 ng/mL (ref 12–200)
Troponin i, poc: <0.05 ng/mL (ref 0.00–0.09)

## 2010-12-22 LAB — CK TOTAL AND CKMB (NOT AT ARMC)
CK, MB: 1.9 ng/mL (ref 0.3–4.0)
CK, MB: 2 ng/mL (ref 0.3–4.0)
CK, MB: 2.7 ng/mL (ref 0.3–4.0)
Relative Index: 1.6 (ref 0.0–2.5)
Relative Index: 1.6 (ref 0.0–2.5)
Relative Index: 1.7 (ref 0.0–2.5)
Total CK: 122 U/L (ref 7–177)
Total CK: 126 U/L (ref 7–177)
Total CK: 159 U/L (ref 7–177)

## 2010-12-22 LAB — TROPONIN I
Troponin I: 0.01 ng/mL (ref 0.00–0.06)
Troponin I: 0.01 ng/mL (ref 0.00–0.06)
Troponin I: 0.02 ng/mL (ref 0.00–0.06)

## 2010-12-22 LAB — LIPID PANEL
Cholesterol: 194 mg/dL (ref 0–200)
HDL: 38 mg/dL — ABNORMAL LOW (ref >39)
LDL Cholesterol: 121 mg/dL — ABNORMAL HIGH (ref 0–99)
Total CHOL/HDL Ratio: 5.1 RATIO
Triglycerides: 175 mg/dL — ABNORMAL HIGH (ref <150)
VLDL: 35 mg/dL (ref 0–40)

## 2010-12-22 LAB — GLUCOSE, CAPILLARY
Glucose-Capillary: 212 mg/dL — ABNORMAL HIGH (ref 70–99)
Glucose-Capillary: 276 mg/dL — ABNORMAL HIGH (ref 70–99)
Glucose-Capillary: 277 mg/dL — ABNORMAL HIGH (ref 70–99)

## 2010-12-22 LAB — APTT: aPTT: 34 seconds (ref 24–37)

## 2010-12-22 LAB — PROTIME-INR
INR: 0.99 (ref 0.00–1.49)
Prothrombin Time: 13.3 seconds (ref 11.6–15.2)

## 2010-12-22 NOTE — Letter (Signed)
Summary: [facility] - Office Note  [facility] - Office Note   Imported By: Marylou Mccoy 12/18/2010 16:31:25  _____________________________________________________________________  External Attachment:    Type:   Image     Comment:   External Document

## 2010-12-30 LAB — LIPID PANEL
Cholesterol: 265 mg/dL — ABNORMAL HIGH (ref 0–200)
HDL: 35 mg/dL — ABNORMAL LOW (ref >39)
LDL Cholesterol: 181 mg/dL — ABNORMAL HIGH (ref 0–99)
Total CHOL/HDL Ratio: 7.6 RATIO
Triglycerides: 244 mg/dL — ABNORMAL HIGH (ref <150)
VLDL: 49 mg/dL — ABNORMAL HIGH (ref 0–40)

## 2010-12-30 LAB — CARDIAC PANEL(CRET KIN+CKTOT+MB+TROPI)
CK, MB: 1.5 ng/mL (ref 0.3–4.0)
Relative Index: 1.2 (ref 0.0–2.5)
Total CK: 124 U/L (ref 7–177)
Troponin I: 0.05 ng/mL (ref 0.00–0.06)

## 2010-12-30 LAB — GLUCOSE, CAPILLARY
Glucose-Capillary: 180 mg/dL — ABNORMAL HIGH (ref 70–99)
Glucose-Capillary: 190 mg/dL — ABNORMAL HIGH (ref 70–99)
Glucose-Capillary: 192 mg/dL — ABNORMAL HIGH (ref 70–99)
Glucose-Capillary: 193 mg/dL — ABNORMAL HIGH (ref 70–99)
Glucose-Capillary: 196 mg/dL — ABNORMAL HIGH (ref 70–99)
Glucose-Capillary: 209 mg/dL — ABNORMAL HIGH (ref 70–99)
Glucose-Capillary: 210 mg/dL — ABNORMAL HIGH (ref 70–99)
Glucose-Capillary: 217 mg/dL — ABNORMAL HIGH (ref 70–99)
Glucose-Capillary: 218 mg/dL — ABNORMAL HIGH (ref 70–99)
Glucose-Capillary: 247 mg/dL — ABNORMAL HIGH (ref 70–99)
Glucose-Capillary: 263 mg/dL — ABNORMAL HIGH (ref 70–99)
Glucose-Capillary: 275 mg/dL — ABNORMAL HIGH (ref 70–99)
Glucose-Capillary: 324 mg/dL — ABNORMAL HIGH (ref 70–99)

## 2010-12-30 LAB — COMPREHENSIVE METABOLIC PANEL
ALT: 61 U/L — ABNORMAL HIGH (ref 0–35)
AST: 70 U/L — ABNORMAL HIGH (ref 0–37)
Albumin: 4 g/dL (ref 3.5–5.2)
Alkaline Phosphatase: 42 U/L (ref 39–117)
BUN: 8 mg/dL (ref 6–23)
CO2: 27 mEq/L (ref 19–32)
Calcium: 9.5 mg/dL (ref 8.4–10.5)
Chloride: 100 mEq/L (ref 96–112)
Creatinine, Ser: 0.87 mg/dL (ref 0.4–1.2)
GFR calc Af Amer: >60 mL/min (ref >60)
GFR calc non Af Amer: >60 mL/min (ref >60)
Glucose, Bld: 314 mg/dL — ABNORMAL HIGH (ref 70–99)
Potassium: 3.6 mEq/L (ref 3.5–5.1)
Sodium: 137 mEq/L (ref 135–145)
Total Bilirubin: 1 mg/dL (ref 0.3–1.2)
Total Protein: 7.5 g/dL (ref 6.0–8.3)

## 2010-12-30 LAB — CBC
HCT: 35.6 % — ABNORMAL LOW (ref 36.0–46.0)
HCT: 39.1 % (ref 36.0–46.0)
HCT: 40.7 % (ref 36.0–46.0)
Hemoglobin: 12.5 g/dL (ref 12.0–15.0)
Hemoglobin: 13.5 g/dL (ref 12.0–15.0)
Hemoglobin: 14.1 g/dL (ref 12.0–15.0)
MCHC: 34.5 g/dL (ref 30.0–36.0)
MCHC: 34.6 g/dL (ref 30.0–36.0)
MCHC: 35.2 g/dL (ref 30.0–36.0)
MCV: 90.5 fL (ref 78.0–100.0)
MCV: 90.5 fL (ref 78.0–100.0)
MCV: 91 fL (ref 78.0–100.0)
Platelets: 208 10*3/uL (ref 150–400)
Platelets: 231 10*3/uL (ref 150–400)
Platelets: 236 10*3/uL (ref 150–400)
RBC: 3.93 MIL/uL (ref 3.87–5.11)
RBC: 4.3 MIL/uL (ref 3.87–5.11)
RBC: 4.5 MIL/uL (ref 3.87–5.11)
RDW: 12.3 % (ref 11.5–15.5)
RDW: 12.3 % (ref 11.5–15.5)
RDW: 12.6 % (ref 11.5–15.5)
WBC: 7.6 10*3/uL (ref 4.0–10.5)
WBC: 8.3 10*3/uL (ref 4.0–10.5)
WBC: 9.2 10*3/uL (ref 4.0–10.5)

## 2010-12-30 LAB — POCT CARDIAC MARKERS
CKMB, poc: 1.2 ng/mL (ref 1.0–8.0)
CKMB, poc: 1.3 ng/mL (ref 1.0–8.0)
CKMB, poc: 1.5 ng/mL (ref 1.0–8.0)
Myoglobin, poc: 110 ng/mL (ref 12–200)
Myoglobin, poc: 70.8 ng/mL (ref 12–200)
Myoglobin, poc: 81.6 ng/mL (ref 12–200)
Troponin i, poc: <0.05 ng/mL (ref 0.00–0.09)
Troponin i, poc: <0.05 ng/mL (ref 0.00–0.09)
Troponin i, poc: <0.05 ng/mL (ref 0.00–0.09)

## 2010-12-30 LAB — DIFFERENTIAL
Basophils Absolute: 0 10*3/uL (ref 0.0–0.1)
Basophils Relative: 0 % (ref 0–1)
Eosinophils Absolute: 0.2 10*3/uL (ref 0.0–0.7)
Eosinophils Relative: 2 % (ref 0–5)
Lymphocytes Relative: 31 % (ref 12–46)
Lymphs Abs: 2.9 10*3/uL (ref 0.7–4.0)
Monocytes Absolute: 0.6 10*3/uL (ref 0.1–1.0)
Monocytes Relative: 7 % (ref 3–12)
Neutro Abs: 5.5 10*3/uL (ref 1.7–7.7)
Neutrophils Relative %: 60 % (ref 43–77)

## 2010-12-30 LAB — POCT I-STAT, CHEM 8
BUN: 8 mg/dL (ref 6–23)
Calcium, Ion: 1.21 mmol/L (ref 1.12–1.32)
Chloride: 105 mEq/L (ref 96–112)
Creatinine, Ser: 0.6 mg/dL (ref 0.4–1.2)
Glucose, Bld: 164 mg/dL — ABNORMAL HIGH (ref 70–99)
HCT: 36 % (ref 36.0–46.0)
Hemoglobin: 12.2 g/dL (ref 12.0–15.0)
Potassium: 3.6 mEq/L (ref 3.5–5.1)
Sodium: 139 mEq/L (ref 135–145)
TCO2: 26 mmol/L (ref 0–100)

## 2010-12-30 LAB — BASIC METABOLIC PANEL
BUN: 9 mg/dL (ref 6–23)
BUN: 9 mg/dL (ref 6–23)
CO2: 26 mEq/L (ref 19–32)
CO2: 29 mEq/L (ref 19–32)
Calcium: 8.9 mg/dL (ref 8.4–10.5)
Calcium: 9.4 mg/dL (ref 8.4–10.5)
Chloride: 102 mEq/L (ref 96–112)
Chloride: 99 mEq/L (ref 96–112)
Creatinine, Ser: 0.81 mg/dL (ref 0.4–1.2)
Creatinine, Ser: 0.89 mg/dL (ref 0.4–1.2)
GFR calc Af Amer: >60 mL/min (ref >60)
GFR calc Af Amer: >60 mL/min (ref >60)
GFR calc non Af Amer: >60 mL/min (ref >60)
GFR calc non Af Amer: >60 mL/min (ref >60)
Glucose, Bld: 204 mg/dL — ABNORMAL HIGH (ref 70–99)
Glucose, Bld: 220 mg/dL — ABNORMAL HIGH (ref 70–99)
Potassium: 3.4 mEq/L — ABNORMAL LOW (ref 3.5–5.1)
Potassium: 3.4 mEq/L — ABNORMAL LOW (ref 3.5–5.1)
Sodium: 137 mEq/L (ref 135–145)
Sodium: 139 mEq/L (ref 135–145)

## 2010-12-30 LAB — CK TOTAL AND CKMB (NOT AT ARMC)
CK, MB: 1.9 ng/mL (ref 0.3–4.0)
Relative Index: 1.5 (ref 0.0–2.5)
Total CK: 128 U/L (ref 7–177)

## 2010-12-30 LAB — URINALYSIS, ROUTINE W REFLEX MICROSCOPIC
Bilirubin Urine: NEGATIVE
Glucose, UA: NEGATIVE mg/dL
Hgb urine dipstick: NEGATIVE
Ketones, ur: NEGATIVE mg/dL
Leukocytes, UA: NEGATIVE
Nitrite: NEGATIVE
Protein, ur: NEGATIVE mg/dL
Specific Gravity, Urine: 1.011 (ref 1.005–1.030)
Urobilinogen, UA: 0.2 mg/dL (ref 0.0–1.0)
pH: 5 (ref 5.0–8.0)

## 2010-12-30 LAB — HEMOGLOBIN A1C
Hgb A1c MFr Bld: 7.6 % — ABNORMAL HIGH (ref 4.6–6.1)
Mean Plasma Glucose: 171 mg/dL

## 2010-12-30 LAB — URINE MICROSCOPIC-ADD ON

## 2010-12-30 LAB — TROPONIN I: Troponin I: <0.01 ng/mL (ref 0.00–0.06)

## 2010-12-30 LAB — MAGNESIUM: Magnesium: 1.6 mg/dL (ref 1.5–2.5)

## 2010-12-30 LAB — TSH: TSH: 3.173 u[IU]/mL (ref 0.350–4.500)

## 2010-12-30 LAB — PROTIME-INR
INR: 1.02 (ref 0.00–1.49)
Prothrombin Time: 13.3 seconds (ref 11.6–15.2)

## 2011-01-14 LAB — COMPREHENSIVE METABOLIC PANEL
ALT: 41 U/L — ABNORMAL HIGH (ref 0–35)
AST: 39 U/L — ABNORMAL HIGH (ref 0–37)
Albumin: 3.8 g/dL (ref 3.5–5.2)
Alkaline Phosphatase: 44 U/L (ref 39–117)
BUN: 6 mg/dL (ref 6–23)
CO2: 25 mEq/L (ref 19–32)
Calcium: 9 mg/dL (ref 8.4–10.5)
Chloride: 102 mEq/L (ref 96–112)
Creatinine, Ser: 0.85 mg/dL (ref 0.4–1.2)
GFR calc Af Amer: >60 mL/min (ref >60)
GFR calc non Af Amer: >60 mL/min (ref >60)
Glucose, Bld: 135 mg/dL — ABNORMAL HIGH (ref 70–99)
Potassium: 3.4 mEq/L — ABNORMAL LOW (ref 3.5–5.1)
Sodium: 137 mEq/L (ref 135–145)
Total Bilirubin: 0.5 mg/dL (ref 0.3–1.2)
Total Protein: 7.3 g/dL (ref 6.0–8.3)

## 2011-01-14 LAB — DIFFERENTIAL
Basophils Absolute: 0 10*3/uL (ref 0.0–0.1)
Basophils Relative: 0 % (ref 0–1)
Eosinophils Absolute: 0.2 10*3/uL (ref 0.0–0.7)
Eosinophils Relative: 3 % (ref 0–5)
Lymphocytes Relative: 32 % (ref 12–46)
Lymphs Abs: 1.9 10*3/uL (ref 0.7–4.0)
Monocytes Absolute: 0.6 10*3/uL (ref 0.1–1.0)
Monocytes Relative: 10 % (ref 3–12)
Neutro Abs: 3.2 10*3/uL (ref 1.7–7.7)
Neutrophils Relative %: 54 % (ref 43–77)

## 2011-01-14 LAB — URINALYSIS, ROUTINE W REFLEX MICROSCOPIC
Bilirubin Urine: NEGATIVE
Glucose, UA: NEGATIVE mg/dL
Hgb urine dipstick: NEGATIVE
Ketones, ur: NEGATIVE mg/dL
Nitrite: NEGATIVE
Protein, ur: NEGATIVE mg/dL
Specific Gravity, Urine: 1.019 (ref 1.005–1.030)
Urobilinogen, UA: 0.2 mg/dL (ref 0.0–1.0)
pH: 5 (ref 5.0–8.0)

## 2011-01-14 LAB — CBC
HCT: 40.4 % (ref 36.0–46.0)
Hemoglobin: 13.6 g/dL (ref 12.0–15.0)
MCHC: 33.7 g/dL (ref 30.0–36.0)
MCV: 90.2 fL (ref 78.0–100.0)
Platelets: 237 10*3/uL (ref 150–400)
RBC: 4.48 MIL/uL (ref 3.87–5.11)
RDW: 12.4 % (ref 11.5–15.5)
WBC: 5.8 10*3/uL (ref 4.0–10.5)

## 2011-01-14 LAB — URINE MICROSCOPIC-ADD ON

## 2011-01-14 LAB — LIPASE, BLOOD: Lipase: 18 U/L (ref 11–59)

## 2011-02-26 NOTE — Assessment & Plan Note (Signed)
[facility]                           GASTROENTEROLOGY OFFICE NOTE   NAME:Alice Cole, ANHTHU PERDEW                          MRN:          [id number]  DATE:06/06/2006                            DOB:          [birth year]    Patient comes in and says she needs a colonoscopic examination, has been  having some abdominal pain, especially with certain foods.  Denies any blood  or mucus in her stool.  Bowel movements change sometimes from constipation  to diarrhea. She had been taking some Nexium and has also been on some  Actos.  She has noticed some recent left lower quadrant tenderness and felt  maybe it was due to her diverticular disease.  She also  has been  complaining of some epigastric pain that she notes occasionally.  She is  status post cholecystectomy.  She has a known hiatal hernia with mild reflux  and a stricture.  Her last endoscopy was done in 2002.  Her last  colonoscopic examination was done in the summer time and she was found to  have some mild diverticular disease.   PHYSICAL EXAMINATION:  GENERAL APPEARANCE:  She was mildly obese, was  somewhat short of breath.  VITAL SIGNS:  Weight 207, blood pressure 124/70, pulse 76 and regular.  HEENT:  Oropharynx negative.  NECK:  Negative.  Thyroid felt normal.  CHEST:  Clear.  CARDIOVASCULAR:  Regular rhythm without significant murmur.  ABDOMEN:  Soft, tender on palpation of the left lower quadrant consistent  with some diverticulitis. There were no bruits or rubs.  EXTREMITIES:  Unremarkable.   IMPRESSION:  1. Left lower quadrant pain with recent change in bowel habits and the      patient has a history of diverticular disease and diverticulitis in the      past.  2. Gastroesophageal reflux disease with history of esophageal stricture.  3. Status post colon polyps.  4. Mild obesity.   RECOMMENDATIONS:  Take Cipro 5 mg b.i.d. x10 days.  Get some routine labs on  her.  Schedule for colonoscopic  examination sometime in the next  3-4 weeks  at her convenience.                                   Ulyess Mort, MD   SML/MedQ  DD:  06/06/2006  DT:  06/07/2006  Job #:  [id number]

## 2011-02-26 NOTE — Letter (Signed)
August 31, 2006    Armanda Magic, M.D.  [address], [address]  [place], [place] [address]   RE:  [name], [name]  MRN:  [id number]  /  DOB:  [birth year]   Dear Gloris Manchester:   I hope you had a wonderful Thanksgiving with your family.  I had the  pleasure of seeing Alice Cole today at your request regarding her PVCs.   The history was a little bit difficult for me.  As best I could gather, the  patient has recurrent episodes that have gone on and off for years where she  feels bad.  This is characterized by being tired, weak, some shortness of  breath.  It is not clear in her mind that these are associated with  otherwise documented bigeminy.  Indeed, sometimes her heart rate at home is  80, sometimes it is 40, and she notes no association between the different  heart rates and her symptoms.   She also has a history of a racing heartbeat occurring 5 to 6 times a month.  This typically lasts for hours.  This is also associated with shortness of  breath and chest discomfort.  This is both regular and irregular.   She has a history of chronic dyspnea that has been worse over the last  number of months.  She has some peripheral edema, but no orthopnea or  nocturnal dyspnea.   She also has significant daytime somnolence.  Some years ago Holter  monitoring demonstrated significant nocturnal bradycardia.  She has been  told she snores.   CARDIAC RISK FACTORS:  Notable for hypertension, diabetes, and dyslipidemia.  She had a catheterization done by Dr. Meade Maw, about 4 years ago that  was negative.   Monitoring in the past had demonstrated infrequent PVCs, albeit on a Holter.  There is an electrocardiogram more recently wherein a bigeminal pattern of  left bundle branch block inferior axis PVCs was identified; August 04, 2006.   PAST MEDICAL HISTORY:  In addition to the above, is largely negative.   REVIEW OF SYSTEMS:  Notable for:  1. Gastroesophageal reflux disease.  2.  Acute bronchitis.  3. Rash on her legs related to her diabetes.  4. Elevated CPKs followed by Dr. Coral Spikes.   Other organ systems are negative.   PAST SURGICAL HISTORY:  1. Partial hysterectomy.  2. Right knee surgery x2.  3. Cholecystectomy.   MEDICATIONS:  1. Cartia 240.  2. Altace 15.  3. Metformin 1000 b.i.d.  4. Actos 30.  5. Hydrochlorothiazide 25.  6. Premarin.  7. Nexium.  8. Glipizide 5.   She is allergic to CODEINE, PAMELOR, BETA BLOCKERS, FLAGYL, and she is  intolerant of STATINS with CK elevations.   She is married.  She has 2 children and 3 grand-children, whom she is  raising.  She uses caffeine, but does not use cigarettes or recreational  drugs.   EXAMINATION:  Alice Cole had a blood pressure of 142/80, pulse 88, weight 209  pounds.  HEENT:  No icterus or xanthoma.  Neck veins were flat.  Carotids were brisk and full bilaterally without  bruits.  BACK:  Without kyphosis, scoliosis.  LUNGS:  Clear.  HEART:  Sounds were regular without murmurs or gallops.  ABDOMEN:  Soft with active bowel sounds without midline pulsation or  hepatomegaly.  Femoral pulses were 2+.  Distal pulses were intact.  There is  no cyanosis, clubbing, or edema.  NEUROLOGIC:  Grossly normal.  SKIN:  Warm and dry.   ELECTROCARDIOGRAM:  Dated August 31, 2006, demonstrated sinus rhythm at 88  with intervals of 0.18/0.08/0.33.  The axis was leftward minus 20.  There is  evidence of right atrial enlargement and poor R wave progression, both of  which are old.   IMPRESSION:  1. Ventricular ectopy, originating from the right ventricular outflow      tract.  2. Significant episodic fatigue.  3. Symptoms consistent with obstructive sleep apnea.  4. Tachy palpitations, question cause.  5. Cardiac risk factors notable for:      a.     Diabetes.      b.     Hypertension.      c.     Dyslipidemia.   Alice Cole has significant fatigue and palpitations.  The question that I have  is, to  what degree are the PVCs related to her other symptoms.   I suggested that an event recorder might be very helpful in trying to  clarify the mechanism of her palpitations, and also to see whether her  episodes of fatigue correlate with an arrhythmia.   In addition, given her daytime somnolence, habitus, I suspect that she has  obstructive sleep apnea and suggested that she could followup with you about  getting a sleep study arranged.  I will plan, if it is all right with you,  to take the liberty of having her follow up with me in about 6 weeks' time  to review the above data.  If it would be okay if our office got the event  for direct access to the data, that would work.  If you want to get it  through your office, that would be fine too.  If you would just make sure  that she bring the day with her when  she comes.   Traci, again, thanks very much for me to see her, and I hope you had a happy  Thanksgiving.    Sincerely,      Duke Salvia, MD, [facility]  Electronically Signed    SCK/MedQ  DD: 08/31/2006  DT: 08/31/2006  Job #: [id number]   CC:    Carola J. Gerri Spore, M.D.

## 2011-02-26 NOTE — Op Note (Signed)
Alice Cole, SHIMON                             ACCOUNT NO.:  [id number]   MEDICAL RECORD NO.:  [id number]                   PATIENT TYPE:  OIB   LOCATION:  2899                                 FACILITY:  [facility]   PHYSICIAN:  Sandria Bales. Ezzard Standing, M.D.               DATE OF BIRTH:  12/03/1945   DATE OF PROCEDURE:  05/09/2002  DATE OF DISCHARGE:                                 OPERATIVE REPORT   PREOPERATIVE DIAGNOSIS:  Chronic cholecystitis and cholelithiasis.   POSTOPERATIVE DIAGNOSES:  Chronic cholecystitis and cholelithiasis.   OPERATION PERFORMED:  Laparoscopic cholecystectomy with intraoperative  cholangiogram.   SURGEON:  Sandria Bales. Ezzard Standing, M.D.   FIRST ASSISTANT:  Gita Kudo, M.D.   ANESTHESIA:  General endotracheal.   ESTIMATED BLOOD LOSS:  Minimal.   INDICATIONS FOR PROCEDURE:  Alice Cole is a 65 year old white female who is a  patient of Dr. Carolin Coy, sees Dr. Candace Cruise from a cardiology  standpoint and Dr. Victorino Dike from a GI standpoint.  She has symptomatic  cholelithiasis.  She now comes for attempted laparoscopic cholecystectomy.  Both the indications and potential complications of the procedure have been  discussed with the patient.   DESCRIPTION OF PROCEDURE:  The patient was placed in the supine position  with both arms out to her side.  She was given 1 gm of Ancef at the  initiation of the procedure, had PAS stockings and an orogastric tube  passed.  The abdomen was prepped with Betadine solution and sterilely  draped.  An infraumbilical incision was made with sharp dissection carried  down to the abdominal cavity.  A 0 degree laparoscope was inserted through a  12 mm Hasson trocar.  The Hasson trocar was secured with a 0 Vicryl suture.  The laparoscope was inserted into the abdomen after it had been pressurized  with CO2 and abdominal exploration carried out.  I could not really see any  of the pelvic organs.  Her omentum was covered with fat.   Both lobes of her  liver were visualized, were somewhat fatty nature.  The anterior wall of the  stomach was seen and unremarkable.   Three additional trocars were placed, a 10 mm Ethicon trocar in the  subxiphoid location, a 5 mm Ethicon trocar in the right midsubcostal  location, and a 5 mm Ethicon trocar in the right lateral subcostal location.  The gallbladder was then identified, grasped, rotated cephalad.  The patient  had a fair amount of adhesion in the scar tissue around the gallbladder  cystic duct junction which were taken down.  The cystic artery was  identified, triply endoclipped and divided.  The cystic duct lymph node was  stripped away but left attached using fat.  I then localized the cystic duct  itself.   A clip was placed on the gallbladder side of the cystic duct.  An  intraoperative  cholangiogram was obtained using a cutoff taut catheter  inserted through a 14 gauge Jelco catheter.  The taut catheter was inserted  into the side of the ____ and secured with an  Endo clip.  An intraoperative  cholangiogram was obtained.  The intraoperative cholangiogram was obtained  using half strength Hypaque solution about 6 to 8 cc under direct  fluoroscopy.  This showed free flow of contrast down the cystic duct, down  the common bile duct into the duodenum and also at the hepatic radicals.  There was no obstruction and no mass.  Endoscopically normal intraoperative  cholangiogram.   The taut catheter was then removed.  The cystic duct triply endoclipped,  then divided.  The gallbladder was then sharply and bluntly dissected from  the gallbladder bed using primarily hook Bovie coagulation.  Prior to  complete division of the gallbladder from the gallbladder bed, the triangle  of Calot and the gallbladder bed were visualized.  There was no dye leak, no  bleeding and the gallbladder was then divided, delivered through the  umbilicus intact and sent to pathology.   The patient  tolerated the procedure well, was transported to the recovery  room in good condition.  Each port site was visualized as was removed.  The  umbilical port was closed with a 0 Vicryl suture.  The skin at each port was  closed with a 4-0 Vicryl suture, painted with tincture of benzoin, steri-  stripped and sterilely dressed.   The patient was then transported to the recovery room in good condition.  Sponge and needle counts were correct at the end of this case.                                                   Sandria Bales. Ezzard Standing, M.D.    DHN/MEDQ  D:  05/09/2002  T:  05/12/2002  Job:  [id number]   cc:   Otilio Connors. Gerri Spore, M.D.   Helen A. Fraser Din, M.D.   Ulyess Mort, M.D. [facility]

## 2011-02-26 NOTE — Letter (Signed)
September 22, 2006    Armanda Magic, M.D.  [address], [address]  [place], [place] [address]   RE:  LEIAH, GIANNOTTI  MRN:  [id number]  /  DOB:  08-20-1946   Dear Gloris Manchester:   Alice Cole came in today to review her event monitor. Thanks very much  for arranging to have gotten that done.   It was very interesting in that she has multiple tracings that show  similar arrhythmias that is PACs, PVCs although these two different  morphologies.  Interestingly though, some of these are associated with  symptoms and others are not.   More over, she is feeling much better over the last month or so.  Merely  up titration of her Cardia had been attempted but this had to be down  titrated because of headaches.  She is better on the 240 than the 180.   Her blood pressure remains elevated not with any increase of the Altace.  She is scheduled for her sleep study next weekend.   EXAMINATION:  Her blood pressure is 152/90, her pulse was recorded at  90, but I got 76.  Her lungs were clear.  Her heart sounds were irregular.   The event monitor strips were noted previously.   I told her at this point, given her lack of symptoms and the variability  in her ectopy, that medical therapy is the right treatment strategy.  A  low dose beta blocker might be useful to augment a rhythm control as  well as blood pressure control, although I would hope that if she turns  out to have sleep apnea, that treatment of her sleep apnea will also  take care of dealing with her blood pressure.   Thanks very much for asking [place] to see her.   Hope you have a Merry Christmas.    Sincerely,      Duke Salvia, MD, [facility]  Electronically Signed    SCK/MedQ  DD: 09/22/2006  DT: 09/22/2006  Job #: [id number]

## 2011-05-12 LAB — HM DIABETES EYE EXAM

## 2011-07-15 ENCOUNTER — Other Ambulatory Visit: Payer: Self-pay | Admitting: Family Medicine

## 2011-07-15 DIAGNOSIS — R1013 Epigastric pain: Secondary | ICD-10-CM

## 2011-07-16 ENCOUNTER — Ambulatory Visit
Admission: RE | Admit: 2011-07-16 | Discharge: 2011-07-16 | Disposition: A | Discharge status: Home / Self Care | Payer: Medicare Other | Source: Ambulatory Visit | Attending: Family Medicine | Admitting: Family Medicine

## 2011-07-16 DIAGNOSIS — R1013 Epigastric pain: Secondary | ICD-10-CM

## 2011-10-13 DIAGNOSIS — IMO0002 Reserved for concepts with insufficient information to code with codable children: Secondary | ICD-10-CM | POA: Diagnosis not present

## 2011-10-13 DIAGNOSIS — M1710 Unilateral primary osteoarthritis, unspecified knee: Secondary | ICD-10-CM | POA: Diagnosis not present

## 2011-10-13 DIAGNOSIS — M5137 Other intervertebral disc degeneration, lumbosacral region: Secondary | ICD-10-CM | POA: Diagnosis not present

## 2011-10-14 ENCOUNTER — Other Ambulatory Visit: Payer: Self-pay | Admitting: Orthopedic Surgery

## 2011-10-14 DIAGNOSIS — M5410 Radiculopathy, site unspecified: Secondary | ICD-10-CM

## 2011-10-14 DIAGNOSIS — M545 Low back pain, unspecified: Secondary | ICD-10-CM

## 2011-10-20 DIAGNOSIS — IMO0001 Reserved for inherently not codable concepts without codable children: Secondary | ICD-10-CM | POA: Diagnosis not present

## 2011-10-20 DIAGNOSIS — E119 Type 2 diabetes mellitus without complications: Secondary | ICD-10-CM | POA: Diagnosis not present

## 2011-10-20 DIAGNOSIS — E669 Obesity, unspecified: Secondary | ICD-10-CM | POA: Diagnosis not present

## 2011-10-23 ENCOUNTER — Inpatient Hospital Stay: Admission: RE | Admit: 2011-10-23 | Payer: Medicare Other | Source: Ambulatory Visit

## 2011-10-28 ENCOUNTER — Inpatient Hospital Stay: Admission: RE | Admit: 2011-10-28 | Payer: Medicare Other | Source: Ambulatory Visit

## 2011-11-26 DIAGNOSIS — I119 Hypertensive heart disease without heart failure: Secondary | ICD-10-CM | POA: Diagnosis not present

## 2011-11-26 DIAGNOSIS — I4949 Other premature depolarization: Secondary | ICD-10-CM | POA: Diagnosis not present

## 2011-11-26 DIAGNOSIS — R002 Palpitations: Secondary | ICD-10-CM | POA: Diagnosis not present

## 2011-12-23 DIAGNOSIS — R1013 Epigastric pain: Secondary | ICD-10-CM | POA: Diagnosis not present

## 2011-12-23 DIAGNOSIS — E669 Obesity, unspecified: Secondary | ICD-10-CM | POA: Diagnosis not present

## 2011-12-23 DIAGNOSIS — K3189 Other diseases of stomach and duodenum: Secondary | ICD-10-CM | POA: Diagnosis not present

## 2011-12-23 DIAGNOSIS — IMO0001 Reserved for inherently not codable concepts without codable children: Secondary | ICD-10-CM | POA: Diagnosis not present

## 2011-12-24 ENCOUNTER — Encounter: Payer: Self-pay | Admitting: *Deleted

## 2011-12-27 ENCOUNTER — Encounter: Payer: Self-pay | Admitting: Internal Medicine

## 2011-12-27 ENCOUNTER — Ambulatory Visit (INDEPENDENT_AMBULATORY_CARE_PROVIDER_SITE_OTHER): Payer: Medicare Other | Admitting: Internal Medicine

## 2011-12-27 VITALS — BP 128/72 | HR 89 | Resp 20 | Ht 67.0 in | Wt 186.1 lb

## 2011-12-27 DIAGNOSIS — I4949 Other premature depolarization: Secondary | ICD-10-CM | POA: Diagnosis not present

## 2011-12-27 DIAGNOSIS — I493 Ventricular premature depolarization: Secondary | ICD-10-CM | POA: Insufficient documentation

## 2011-12-27 NOTE — Patient Instructions (Signed)
Your physician wants you to follow-up in: 4 months with Dr Allred You will receive a reminder letter in the mail two months in advance. If you don't receive a letter, please call our office to schedule the follow-up appointment.  

## 2011-12-27 NOTE — Progress Notes (Signed)
PCP:  Elie Confer, MD, MD Primary Cardiologist:  Dr Mayford Knife  The patient presents today for routine electrophysiology followup.  Since last being seen in our clinic, the patient reports doing reasonably well.  Her PVCs has been reasonably controlled.  She did have increase in PVC burden several weeks ago, with bigeminy for several days.  She notes that her BP was also elevated at that time.  Since, her PVCS have improved.  Presently, she is back to her usual health state.  Today, she denies symptoms of palpitations, chest pain, shortness of breath, orthopnea, PND, lower extremity edema, dizziness, presyncope, syncope, or neurologic sequela.  The patient feels that she is tolerating medications without difficulties and is otherwise without complaint today.   Past Medical History  Diagnosis Date  . Hypertension   . Hyperlipidemia   . Diabetes mellitus   . Dermatitis   . Diverticulitis     recurrent  . Adenomatous colon polyp   . Obstructive sleep apnea on CPAP     pt does not use  . Heart palpitations   . PVC's (premature ventricular contractions)   . Esophageal stricture   . Hemorrhoids   . Colonic polyp   . Bigeminy   . Chest pain   . DVT (deep venous thrombosis)   . Abnormal liver function    Past Surgical History  Procedure Date  . Cholecystectomy   . Abdominal hysterectomy   . Knee surgery     Right  . Knee surgery     Left    Current Outpatient Prescriptions  Medication Sig Dispense Refill  . albuterol (PROVENTIL HFA;VENTOLIN HFA) 108 (90 BASE) MCG/ACT inhaler Inhale 2 puffs into the lungs every 6 (six) hours as needed.      . ALPRAZolam (XANAX) 0.25 MG tablet Take 0.25 mg by mouth at bedtime as needed.      Marland Kitchen aspirin 325 MG tablet Take 325 mg by mouth daily.      . citalopram (CELEXA) 20 MG tablet Take 20 mg by mouth daily.      Marland Kitchen diltiazem (CARDIZEM CD) 240 MG 24 hr capsule Take 240 mg by mouth daily.      Marland Kitchen diltiazem (DILACOR XR) 240 MG 24 hr capsule Take 240  mg by mouth daily.      Marland Kitchen esomeprazole (NEXIUM) 40 MG capsule Take 40 mg by mouth daily before breakfast.      . estrogens, conjugated, (PREMARIN) 0.625 MG tablet Take 0.625 mg by mouth daily. Take daily for 21 days then do not take for 7 days.      . fluticasone (FLONASE) 50 MCG/ACT nasal spray Place 2 sprays into the nose daily.      . fluvastatin (LESCOL) 20 MG capsule Take 20 mg by mouth at bedtime.      Marland Kitchen glipiZIDE (GLUCOTROL XL) 2.5 MG 24 hr tablet Take 2.5 mg by mouth daily.      Marland Kitchen glipiZIDE (GLUCOTROL) 5 MG tablet Take 5 mg by mouth 2 (two) times daily before a meal.      . hydrALAZINE (APRESOLINE) 25 MG tablet Take 25 mg by mouth 3 (three) times daily.      . hydrochlorothiazide (HYDRODIURIL) 25 MG tablet Take 25 mg by mouth daily.      . hyoscyamine (LEVSIN SL) 0.125 MG SL tablet Place 0.125 mg under the tongue every 4 (four) hours as needed.      . metFORMIN (GLUCOPHAGE) 500 MG tablet Take 500 mg by mouth 2 (two) times  daily with a meal.      . nadolol (CORGARD) 20 MG tablet Take 20 mg by mouth daily.      . nitroGLYCERIN (NITROSTAT) 0.4 MG SL tablet Place 0.4 mg under the tongue every 5 (five) minutes as needed.      Marland Kitchen omega-3 acid ethyl esters (LOVAZA) 1 G capsule Take 2 g by mouth 2 (two) times daily.      . peg 3350 powder (MOVIPREP) 100 G SOLR Take 1 kit by mouth once.      . pioglitazone (ACTOS) 30 MG tablet Take 30 mg by mouth daily.      . ramipril (ALTACE) 10 MG capsule Take 10 mg by mouth daily.      . ramipril (ALTACE) 5 MG tablet Take 5 mg by mouth daily.      . sitaGLIPtin (JANUVIA) 100 MG tablet Take 100 mg by mouth daily.      . sucralfate (CARAFATE) 1 G tablet Take 1 g by mouth 4 (four) times daily.      Marland Kitchen zolpidem (AMBIEN) 10 MG tablet Take 10 mg by mouth at bedtime as needed.        Allergies  Allergen Reactions  . Beta Adrenergic Blockers   . Cephalexin     REACTION: nausea, vomiting, diarrhea  . Codeine   . Colesevelam     REACTION: nausea  . Fluvastatin  Sodium     REACTION: itching  . Lisinopril     REACTION: nausea  . Metoprolol Succinate   . Metronidazole   . Nortriptyline Hcl   . Rosuvastatin     REACTION: cramps  . Sulfamethoxazole W/Trimethoprim     REACTION: nausea, irreg. heartrate    History   Social History  . Marital Status: Married    Spouse Name: N/A    Number of Children: N/A  . Years of Education: N/A   Occupational History  . Not on file.   Social History Main Topics  . Smoking status: Never Smoker   . Smokeless tobacco: Not on file  . Alcohol Use: No  . Drug Use: Not on file  . Sexually Active: Not on file   Other Topics Concern  . Not on file   Social History Narrative  . No narrative on file    Family History  Problem Relation Age of Onset  . Heart attack Mother 18  . Heart attack Brother 30  . Heart attack Maternal Grandmother 67  . Heart attack Maternal Grandfather 63    Physical Exam: Filed Vitals:   12/27/11 1022  BP: 128/72  Pulse: 89  Resp: 20  Height: 5\' 7"  (1.702 m)  Weight: 186 lb 1.9 oz (84.423 kg)    GEN- The patient is well appearing, alert and oriented x 3 today.   Head- normocephalic, atraumatic Eyes-  Sclera clear, conjunctiva pink Ears- hearing intact Oropharynx- clear Neck- supple, no JVP Lymph- no cervical lymphadenopathy Lungs- Clear to ausculation bilaterally, normal work of breathing Heart- Regular rate and rhythm, no murmurs, rubs or gallops, PMI not laterally displaced GI- soft, NT, ND, + BS Extremities- no clubbing, cyanosis, or edema   Assessment and Plan:

## 2011-12-27 NOTE — Assessment & Plan Note (Signed)
Reasonably well controlled, though she did have a spike in PVC burden with symptoms several weeks ago. I suspect that this may have been related to an elevation in her blood pressure. Elevation in BP will cause increase in PVC burden. The importance of tight BP control and avoidance of salt was stressed with the patient today. I have made no medication changes today. She should have an echo obtained by Dr Malachy Mood office to make sure that she has not had any significant structural heart changes. I will see her again in 4 months. She should contact my office if her symptoms worsen in the interim.

## 2012-01-23 DIAGNOSIS — H1030 Unspecified acute conjunctivitis, unspecified eye: Secondary | ICD-10-CM | POA: Diagnosis not present

## 2012-01-24 DIAGNOSIS — H1045 Other chronic allergic conjunctivitis: Secondary | ICD-10-CM | POA: Diagnosis not present

## 2012-01-24 DIAGNOSIS — E669 Obesity, unspecified: Secondary | ICD-10-CM | POA: Diagnosis not present

## 2012-01-24 DIAGNOSIS — H0019 Chalazion unspecified eye, unspecified eyelid: Secondary | ICD-10-CM | POA: Diagnosis not present

## 2012-01-24 DIAGNOSIS — IMO0001 Reserved for inherently not codable concepts without codable children: Secondary | ICD-10-CM | POA: Diagnosis not present

## 2012-01-24 DIAGNOSIS — H01009 Unspecified blepharitis unspecified eye, unspecified eyelid: Secondary | ICD-10-CM | POA: Diagnosis not present

## 2012-01-24 DIAGNOSIS — H2510 Age-related nuclear cataract, unspecified eye: Secondary | ICD-10-CM | POA: Diagnosis not present

## 2012-01-25 DIAGNOSIS — D3130 Benign neoplasm of unspecified choroid: Secondary | ICD-10-CM | POA: Diagnosis not present

## 2012-01-25 DIAGNOSIS — H5789 Other specified disorders of eye and adnexa: Secondary | ICD-10-CM | POA: Diagnosis not present

## 2012-01-25 DIAGNOSIS — H2000 Unspecified acute and subacute iridocyclitis: Secondary | ICD-10-CM | POA: Diagnosis not present

## 2012-01-25 DIAGNOSIS — H15009 Unspecified scleritis, unspecified eye: Secondary | ICD-10-CM | POA: Diagnosis not present

## 2012-01-26 DIAGNOSIS — H2000 Unspecified acute and subacute iridocyclitis: Secondary | ICD-10-CM | POA: Diagnosis not present

## 2012-01-26 DIAGNOSIS — H2510 Age-related nuclear cataract, unspecified eye: Secondary | ICD-10-CM | POA: Diagnosis not present

## 2012-01-26 DIAGNOSIS — H10029 Other mucopurulent conjunctivitis, unspecified eye: Secondary | ICD-10-CM | POA: Diagnosis not present

## 2012-01-26 DIAGNOSIS — H01009 Unspecified blepharitis unspecified eye, unspecified eyelid: Secondary | ICD-10-CM | POA: Diagnosis not present

## 2012-01-27 DIAGNOSIS — D3130 Benign neoplasm of unspecified choroid: Secondary | ICD-10-CM | POA: Diagnosis not present

## 2012-01-27 DIAGNOSIS — H15009 Unspecified scleritis, unspecified eye: Secondary | ICD-10-CM | POA: Diagnosis not present

## 2012-01-27 DIAGNOSIS — H2000 Unspecified acute and subacute iridocyclitis: Secondary | ICD-10-CM | POA: Diagnosis not present

## 2012-01-27 DIAGNOSIS — H5710 Ocular pain, unspecified eye: Secondary | ICD-10-CM | POA: Diagnosis not present

## 2012-01-28 ENCOUNTER — Emergency Department (HOSPITAL_COMMUNITY)
Admission: EM | Admit: 2012-01-28 | Discharge: 2012-01-28 | Disposition: A | Discharge status: Home / Self Care | Payer: Medicare Other | Attending: Emergency Medicine | Admitting: Emergency Medicine

## 2012-01-28 ENCOUNTER — Emergency Department (HOSPITAL_COMMUNITY): Payer: Medicare Other

## 2012-01-28 ENCOUNTER — Encounter (HOSPITAL_COMMUNITY): Payer: Self-pay | Admitting: Physical Medicine and Rehabilitation

## 2012-01-28 DIAGNOSIS — R51 Headache: Secondary | ICD-10-CM | POA: Insufficient documentation

## 2012-01-28 DIAGNOSIS — H5710 Ocular pain, unspecified eye: Secondary | ICD-10-CM | POA: Diagnosis not present

## 2012-01-28 DIAGNOSIS — I10 Essential (primary) hypertension: Secondary | ICD-10-CM | POA: Diagnosis not present

## 2012-01-28 DIAGNOSIS — Z86718 Personal history of other venous thrombosis and embolism: Secondary | ICD-10-CM | POA: Insufficient documentation

## 2012-01-28 DIAGNOSIS — E785 Hyperlipidemia, unspecified: Secondary | ICD-10-CM | POA: Insufficient documentation

## 2012-01-28 DIAGNOSIS — G4733 Obstructive sleep apnea (adult) (pediatric): Secondary | ICD-10-CM | POA: Diagnosis not present

## 2012-01-28 DIAGNOSIS — Z79899 Other long term (current) drug therapy: Secondary | ICD-10-CM | POA: Insufficient documentation

## 2012-01-28 MED ORDER — IBUPROFEN 800 MG PO TABS: 800.0000 mg | ORAL_TABLET | Freq: Three times a day (TID) | ORAL | Status: AC | PRN

## 2012-01-28 MED ORDER — KETOROLAC TROMETHAMINE 60 MG/2ML IM SOLN
30.0000 mg | Freq: Once | INTRAMUSCULAR | Status: AC
  Administered 2012-01-28: 30 mg via INTRAMUSCULAR
  Filled 2012-01-28: qty 2

## 2012-01-28 NOTE — ED Provider Notes (Signed)
History     CSN: [id number]  Arrival date & time 01/28/12  1020   First MD Initiated Contact with Patient 01/28/12 1051      Chief Complaint  Patient presents with  . Eye Pain  . Headache    (Consider location/radiation/quality/duration/timing/severity/associated sxs/prior treatment) HPI The patient presents to the ER for headache for the last 5 days. The patient states that she has had an infection of her eye over the last week. The patient states at times she has light sensitivity. The headache came on gradual after her eye issues started. The patient denies visual changes, weakness, numbness, Cp, SOB, N/V, or fever. The patient states that she has not tried anything to relieve her headache. Past Medical History  Diagnosis Date  . Hypertension   . Hyperlipidemia   . Diabetes mellitus   . Dermatitis   . Diverticulitis     recurrent  . Adenomatous colon polyp   . Obstructive sleep apnea on CPAP     pt does not use  . Heart palpitations   . PVC's (premature ventricular contractions)   . Esophageal stricture   . Hemorrhoids   . Colonic polyp   . Bigeminy   . Chest pain   . DVT (deep venous thrombosis)   . Abnormal liver function     Past Surgical History  Procedure Date  . Cholecystectomy   . Abdominal hysterectomy   . Knee surgery     Right  . Knee surgery     Left    Family History  Problem Relation Age of Onset  . Heart attack Mother 10  . Heart attack Brother 50  . Heart attack Maternal Grandmother 67  . Heart attack Maternal Grandfather 63    History  Substance Use Topics  . Smoking status: Never Smoker   . Smokeless tobacco: Not on file  . Alcohol Use: No    OB History    Grav Para Term Preterm Abortions TAB SAB Ect Mult Living                  Review of Systems All pertinent positives and negatives reviewed in the history of present illness  Allergies  Benicar; Beta adrenergic blockers; Cephalexin; Codeine; Colesevelam; Crestor;  Fluvastatin sodium; Levemir; Lisinopril; Metformin and related; Metoprolol succinate; Metronidazole; Nortriptyline hcl; Rosuvastatin; Sulfamethoxazole w/trimethoprim; Trovafloxacin; and Verapamil  Home Medications   Current Outpatient Rx  Name Route Sig Dispense Refill  . ASPIRIN 325 MG PO TABS Oral Take 325 mg by mouth daily.    Marland Kitchen DILTIAZEM HCL ER COATED BEADS 240 MG PO CP24 Oral Take 240 mg by mouth daily.    Marland Kitchen ESOMEPRAZOLE MAGNESIUM 40 MG PO CPDR Oral Take 40 mg by mouth daily before breakfast.    . ESTROGENS CONJUGATED 0.625 MG PO TABS Oral Take 0.625 mg by mouth daily. Take daily for 21 days then do not take for 7 days.    Marland Kitchen FLUTICASONE PROPIONATE 50 MCG/ACT NA SUSP Nasal Place 2 sprays into the nose daily.    Marland Kitchen FLUVASTATIN SODIUM 20 MG PO CAPS Oral Take 20 mg by mouth at bedtime.    Marland Kitchen HOMATROPINE HBR 5 % OP SOLN Right Eye Place 1 drop into the right eye 4 (four) times daily.    Marland Kitchen HYDROCHLOROTHIAZIDE 25 MG PO TABS Oral Take 25 mg by mouth daily.    . INSULIN ASPART 100 UNIT/ML [place] SOLN Subcutaneous Inject 11 Units into the skin 3 (three) times daily before meals. Plus 1  unit extra for every 35 mg/dl above 161    . INSULIN GLARGINE 100 UNIT/ML [place] SOLN Subcutaneous Inject 36 Units into the skin daily.     Marland Kitchen NADOLOL 20 MG PO TABS Oral Take 20 mg by mouth daily.    . OFLOXACIN 0.3 % OP SOLN Right Eye Place 1 drop into the right eye 4 (four) times daily.    Marland Kitchen PREDNISOLONE ACETATE 1 % OP SUSP Right Eye Place 1 drop into the right eye every 2 (two) hours.     Marland Kitchen RAMIPRIL 10 MG PO CAPS Oral Take 10 mg by mouth daily.    Marland Kitchen RAMIPRIL 5 MG PO TABS Oral Take 5 mg by mouth daily.    . TRIAMCINOLONE ACETONIDE 0.1 % EX CREA Topical Apply 1 application topically 2 (two) times daily.      BP 172/79  Pulse 100  Temp(Src) 97.7 F (36.5 C) (Oral)  Resp 18  SpO2 97%  Physical Exam  Nursing note and vitals reviewed. Constitutional: She appears well-developed and well-nourished. No distress.  HENT:    Head: Normocephalic and atraumatic.  Mouth/Throat: No oropharyngeal exudate.  Eyes: EOM and lids are normal. Pupils are equal, round, and reactive to light. Left eye exhibits no discharge. Right conjunctiva is injected. Left conjunctiva is not injected.  Cardiovascular: Normal rate, regular rhythm and normal heart sounds.  Exam reveals no gallop and no friction rub.   No murmur heard. Pulmonary/Chest: Effort normal and breath sounds normal. No respiratory distress.  Skin: Skin is warm and dry.    ED Course  Procedures (including critical care time)  Labs Reviewed - No data to display Ct Head Wo Contrast  01/28/2012  *RADIOLOGY REPORT*  Clinical Data: 66 year old female with headache, right pain.  CT HEAD WITHOUT CONTRAST  Technique:  Contiguous axial images were obtained from the base of the skull through the vertex without contrast.  Comparison: Neck CT 08/11/2004.  Findings: Visualized paranasal sinuses and mastoids are clear. Visualized orbit soft tissues are within normal limits.  Visualized scalp soft tissues are within normal limits.  No acute osseous abnormality identified.  Calcified atherosclerosis at the skull base.  No ventriculomegaly. No midline shift, mass effect, or evidence of mass lesion.  No acute intracranial hemorrhage identified.  Dystrophic basal ganglia calcifications.  Incidental right inferior basal ganglia dilated perivascular space. No evidence of cortically based acute infarction identified.  Gray-white matter differentiation is within normal limits throughout the brain.  IMPRESSION: Normal noncontrast CT appearance of the brain for age.  Original Report Authenticated By: Harley Hallmark, M.D.      The patient has no neurological deficits noted on exam. Will give the patient treatment for her headache. The patient is referred back to her eye doctor for further evaluation of her eye. Told to return here for any worsening in her headache. She has no fine motor deficits  noted. There are no focal deficits noted on exam.   MDM  MDM Reviewed: nursing note and vitals Interpretation: CT scan            Carlyle Dolly, PA-C 01/28/12 1330

## 2012-01-28 NOTE — ED Notes (Signed)
Pt states she has had right eye infection x4 days, she was given eye drops by her eye doctor that she has been using. Pt states she had an onset of HA last night that last today.

## 2012-01-28 NOTE — ED Provider Notes (Signed)
Medical screening examination/treatment/procedure(s) were performed by non-physician practitioner and as supervising physician I was immediately available for consultation/collaboration.  Gerhard Munch, MD 01/28/12 [phone]

## 2012-01-28 NOTE — Discharge Instructions (Signed)
Follow up with your eye doctor and your regular doctor as well. Return here for any worsening in your condition. Your CT scan was normal.

## 2012-01-28 NOTE — ED Notes (Signed)
Pt presents to department for evaluation of eye pain and headaches. Currently being treated for R eye infection, was placed on several drop and medications for this. Pt states severe headache that started last night. Also states hypertension. 8/10 headache upon arrival to ED. She is alert and oriented x4. No neuro deficits noted. Pt states "It feels like my head is going to explode."

## 2012-02-01 DIAGNOSIS — H43399 Other vitreous opacities, unspecified eye: Secondary | ICD-10-CM | POA: Diagnosis not present

## 2012-02-01 DIAGNOSIS — F411 Generalized anxiety disorder: Secondary | ICD-10-CM | POA: Diagnosis not present

## 2012-02-01 DIAGNOSIS — H109 Unspecified conjunctivitis: Secondary | ICD-10-CM | POA: Diagnosis not present

## 2012-02-01 DIAGNOSIS — J019 Acute sinusitis, unspecified: Secondary | ICD-10-CM | POA: Diagnosis not present

## 2012-02-03 DIAGNOSIS — H2000 Unspecified acute and subacute iridocyclitis: Secondary | ICD-10-CM | POA: Diagnosis not present

## 2012-02-03 DIAGNOSIS — H2510 Age-related nuclear cataract, unspecified eye: Secondary | ICD-10-CM | POA: Diagnosis not present

## 2012-02-06 DIAGNOSIS — J019 Acute sinusitis, unspecified: Secondary | ICD-10-CM | POA: Diagnosis not present

## 2012-02-07 ENCOUNTER — Encounter (HOSPITAL_COMMUNITY): Payer: Self-pay | Admitting: Neurology

## 2012-02-07 ENCOUNTER — Emergency Department (HOSPITAL_COMMUNITY)
Admission: EM | Admit: 2012-02-07 | Discharge: 2012-02-07 | Disposition: A | Discharge status: Home / Self Care | Payer: Medicare Other | Attending: Emergency Medicine | Admitting: Emergency Medicine

## 2012-02-07 ENCOUNTER — Emergency Department (HOSPITAL_COMMUNITY): Payer: Medicare Other

## 2012-02-07 DIAGNOSIS — Z7982 Long term (current) use of aspirin: Secondary | ICD-10-CM | POA: Insufficient documentation

## 2012-02-07 DIAGNOSIS — M25519 Pain in unspecified shoulder: Secondary | ICD-10-CM | POA: Diagnosis not present

## 2012-02-07 DIAGNOSIS — Z86718 Personal history of other venous thrombosis and embolism: Secondary | ICD-10-CM | POA: Insufficient documentation

## 2012-02-07 DIAGNOSIS — R0789 Other chest pain: Secondary | ICD-10-CM | POA: Diagnosis not present

## 2012-02-07 DIAGNOSIS — R079 Chest pain, unspecified: Secondary | ICD-10-CM | POA: Diagnosis not present

## 2012-02-07 DIAGNOSIS — I10 Essential (primary) hypertension: Secondary | ICD-10-CM | POA: Insufficient documentation

## 2012-02-07 DIAGNOSIS — E119 Type 2 diabetes mellitus without complications: Secondary | ICD-10-CM | POA: Insufficient documentation

## 2012-02-07 DIAGNOSIS — Z79899 Other long term (current) drug therapy: Secondary | ICD-10-CM | POA: Diagnosis not present

## 2012-02-07 DIAGNOSIS — E785 Hyperlipidemia, unspecified: Secondary | ICD-10-CM | POA: Diagnosis not present

## 2012-02-07 DIAGNOSIS — M549 Dorsalgia, unspecified: Secondary | ICD-10-CM | POA: Diagnosis not present

## 2012-02-07 DIAGNOSIS — G4733 Obstructive sleep apnea (adult) (pediatric): Secondary | ICD-10-CM | POA: Diagnosis not present

## 2012-02-07 DIAGNOSIS — Z794 Long term (current) use of insulin: Secondary | ICD-10-CM | POA: Insufficient documentation

## 2012-02-07 LAB — COMPREHENSIVE METABOLIC PANEL
ALT: 32 U/L (ref 0–35)
AST: 32 U/L (ref 0–37)
Albumin: 4.2 g/dL (ref 3.5–5.2)
Alkaline Phosphatase: 64 U/L (ref 39–117)
BUN: 13 mg/dL (ref 6–23)
CO2: 25 mEq/L (ref 19–32)
Calcium: 9.9 mg/dL (ref 8.4–10.5)
Chloride: 97 mEq/L (ref 96–112)
Creatinine, Ser: 1.03 mg/dL (ref 0.50–1.10)
GFR calc Af Amer: 65 mL/min — ABNORMAL LOW (ref >90)
GFR calc non Af Amer: 56 mL/min — ABNORMAL LOW (ref >90)
Glucose, Bld: 214 mg/dL — ABNORMAL HIGH (ref 70–99)
Potassium: 4.2 mEq/L (ref 3.5–5.1)
Sodium: 136 mEq/L (ref 135–145)
Total Bilirubin: 0.3 mg/dL (ref 0.3–1.2)
Total Protein: 7.5 g/dL (ref 6.0–8.3)

## 2012-02-07 LAB — TROPONIN I: Troponin I: <0.3 ng/mL (ref <0.30)

## 2012-02-07 LAB — DIFFERENTIAL
Basophils Absolute: 0 10*3/uL (ref 0.0–0.1)
Basophils Relative: 0 % (ref 0–1)
Eosinophils Absolute: 0.1 10*3/uL (ref 0.0–0.7)
Eosinophils Relative: 1 % (ref 0–5)
Lymphocytes Relative: 22 % (ref 12–46)
Lymphs Abs: 2.1 10*3/uL (ref 0.7–4.0)
Monocytes Absolute: 0.6 10*3/uL (ref 0.1–1.0)
Monocytes Relative: 6 % (ref 3–12)
Neutro Abs: 6.9 10*3/uL (ref 1.7–7.7)
Neutrophils Relative %: 71 % (ref 43–77)

## 2012-02-07 LAB — CBC
HCT: 38.9 % (ref 36.0–46.0)
Hemoglobin: 13.5 g/dL (ref 12.0–15.0)
MCH: 29.5 pg (ref 26.0–34.0)
MCHC: 34.7 g/dL (ref 30.0–36.0)
MCV: 85.1 fL (ref 78.0–100.0)
Platelets: 308 10*3/uL (ref 150–400)
RBC: 4.57 MIL/uL (ref 3.87–5.11)
RDW: 12 % (ref 11.5–15.5)
WBC: 9.7 10*3/uL (ref 4.0–10.5)

## 2012-02-07 MED ORDER — ONDANSETRON HCL 4 MG/2ML IJ SOLN
INTRAMUSCULAR | Status: AC
  Filled 2012-02-07: qty 2

## 2012-02-07 NOTE — ED Provider Notes (Signed)
History     CSN: [id number]  Arrival date & time 02/07/12  1241   First MD Initiated Contact with Patient 02/07/12 1257      Chief Complaint  Patient presents with  . Chest Pain    (Consider location/radiation/quality/duration/timing/severity/associated sxs/prior treatment) HPI Comments: For the past 3 nights has woken up with "indigestion".  Today was at the doctor's office, started with tightness in chest into back.  No sob, diaphoresis, or radiation to arm or jaw.    Patient is a 66 y.o. female presenting with chest pain. The history is provided by the patient.  Chest Pain The chest pain began 1 - 2 hours ago. Chest pain occurs constantly. The chest pain is improving. Associated with: nothing. The severity of the pain is moderate. The quality of the pain is described as tightness. The pain does not radiate. Exacerbated by: nothing. Pertinent negatives for primary symptoms include no fever, no cough, no wheezing, no nausea and no vomiting.     Past Medical History  Diagnosis Date  . Hypertension   . Hyperlipidemia   . Diabetes mellitus   . Dermatitis   . Diverticulitis     recurrent  . Adenomatous colon polyp   . Obstructive sleep apnea on CPAP     pt does not use  . Heart palpitations   . PVC's (premature ventricular contractions)   . Esophageal stricture   . Hemorrhoids   . Colonic polyp   . Bigeminy   . Chest pain   . DVT (deep venous thrombosis)   . Abnormal liver function     Past Surgical History  Procedure Date  . Cholecystectomy   . Abdominal hysterectomy   . Knee surgery     Right  . Knee surgery     Left    Family History  Problem Relation Age of Onset  . Heart attack Mother 35  . Heart attack Brother 80  . Heart attack Maternal Grandmother 67  . Heart attack Maternal Grandfather 63    History  Substance Use Topics  . Smoking status: Never Smoker   . Smokeless tobacco: Not on file  . Alcohol Use: No    OB History    Grav Para Term  Preterm Abortions TAB SAB Ect Mult Living                  Review of Systems  Constitutional: Negative for fever.  Respiratory: Negative for cough and wheezing.   Cardiovascular: Positive for chest pain.  Gastrointestinal: Negative for nausea and vomiting.  All other systems reviewed and are negative.    Allergies  Benicar; Beta adrenergic blockers; Cephalexin; Codeine; Colesevelam; Crestor; Fluvastatin sodium; Levemir; Lisinopril; Metformin and related; Metoprolol succinate; Metronidazole; Nortriptyline hcl; Rosuvastatin; Sulfamethoxazole w/trimethoprim; Trovafloxacin; and Verapamil  Home Medications   Current Outpatient Rx  Name Route Sig Dispense Refill  . ASPIRIN 325 MG PO TABS Oral Take 325 mg by mouth daily.    Marland Kitchen DILTIAZEM HCL ER COATED BEADS 240 MG PO CP24 Oral Take 240 mg by mouth daily.    Marland Kitchen ESOMEPRAZOLE MAGNESIUM 40 MG PO CPDR Oral Take 40 mg by mouth daily before breakfast.    . ESTROGENS CONJUGATED 0.625 MG PO TABS Oral Take 0.625 mg by mouth daily. Take daily for 21 days then do not take for 7 days.    Marland Kitchen FLUTICASONE PROPIONATE 50 MCG/ACT NA SUSP Nasal Place 2 sprays into the nose daily.    Marland Kitchen FLUVASTATIN SODIUM 20 MG PO CAPS  Oral Take 20 mg by mouth at bedtime.    Marland Kitchen HOMATROPINE HBR 5 % OP SOLN Right Eye Place 1 drop into the right eye 4 (four) times daily.    Marland Kitchen HYDROCHLOROTHIAZIDE 25 MG PO TABS Oral Take 25 mg by mouth daily.    . IBUPROFEN 800 MG PO TABS Oral Take 1 tablet (800 mg total) by mouth every 8 (eight) hours as needed for pain. 21 tablet 0  . INSULIN ASPART 100 UNIT/ML [place] SOLN Subcutaneous Inject 11 Units into the skin 3 (three) times daily before meals. Plus 1 unit extra for every 35 mg/dl above 952    . INSULIN GLARGINE 100 UNIT/ML [place] SOLN Subcutaneous Inject 36 Units into the skin daily.     Marland Kitchen NADOLOL 20 MG PO TABS Oral Take 20 mg by mouth daily.    . OFLOXACIN 0.3 % OP SOLN Right Eye Place 1 drop into the right eye 4 (four) times daily.    Marland Kitchen PREDNISOLONE  ACETATE 1 % OP SUSP Right Eye Place 1 drop into the right eye every 2 (two) hours.     Marland Kitchen RAMIPRIL 10 MG PO CAPS Oral Take 10 mg by mouth daily.    Marland Kitchen RAMIPRIL 5 MG PO TABS Oral Take 5 mg by mouth daily.    . TRIAMCINOLONE ACETONIDE 0.1 % EX CREA Topical Apply 1 application topically 2 (two) times daily.      BP 141/71  Pulse 94  Temp(Src) 98.1 F (36.7 C) (Oral)  Resp 18  SpO2 93%  Physical Exam  Nursing note and vitals reviewed. Constitutional: She is oriented to person, place, and time. She appears well-developed and well-nourished. No distress.  HENT:  Head: Normocephalic and atraumatic.  Neck: Normal range of motion. Neck supple.  Cardiovascular: Normal rate and regular rhythm.  Exam reveals no gallop and no friction rub.   No murmur heard. Pulmonary/Chest: Effort normal and breath sounds normal. No respiratory distress. She has no wheezes.  Abdominal: Soft. Bowel sounds are normal. She exhibits no distension. There is no tenderness.  Musculoskeletal: Normal range of motion.  Neurological: She is alert and oriented to person, place, and time.  Skin: Skin is warm and dry. She is not diaphoretic.    Date: 02/07/2012  Rate: 87  Rhythm: normal sinus rhythm  QRS Axis: left  Intervals: normal  ST/T Wave abnormalities: normal  Conduction Disutrbances:none  Narrative Interpretation:   Old EKG Reviewed: unchanged    ED Course  Procedures (including critical care time)   Labs Reviewed  CBC  DIFFERENTIAL  COMPREHENSIVE METABOLIC PANEL  TROPONIN I   No results found.   No diagnosis found.    MDM  The ekg and labs look okay.  There is no objective evidence of a cardiac etiology.  She is feeling better and wants to go home.  She will be discharged, to return prn and follow up with her pcp if not improving.        Geoffery Lyons, MD 02/07/12 [phone]

## 2012-02-07 NOTE — ED Notes (Addendum)
Per ems- Pt was at eye doctor, pt became anxious, developed CP and back pain with no other symptoms. When EMS arrived no CP, 5 mins later developed CP, 1 nitro no relief didn't want anymore. Refused aspirin. Pt a & o x 4. EKG unremarkable, 192/88, 88, 16 RR. Skin warm and dry. NAD. Pt ambulated from EMS stretcher to our bed without difficulty. Pt given 4 mg zofran. When pt arrived EMS IV infiltrated, IV removed.

## 2012-02-07 NOTE — Discharge Instructions (Signed)
Chest Pain (Nonspecific) It is often hard to give a specific diagnosis for the cause of chest pain. There is always a chance that your pain could be related to something serious, such as a heart attack or a blood clot in the lungs. You need to follow up with your caregiver for further evaluation. CAUSES   Heartburn.   Pneumonia or bronchitis.   Anxiety or stress.   Inflammation around your heart (pericarditis) or lung (pleuritis or pleurisy).   A blood clot in the lung.   A collapsed lung (pneumothorax). It can develop suddenly on its own (spontaneous pneumothorax) or from injury (trauma) to the chest.   Shingles infection (herpes zoster virus).  The chest wall is composed of bones, muscles, and cartilage. Any of these can be the source of the pain.  The bones can be bruised by injury.   The muscles or cartilage can be strained by coughing or overwork.   The cartilage can be affected by inflammation and become sore (costochondritis).  DIAGNOSIS  Lab tests or other studies, such as X-rays, electrocardiography, stress testing, or cardiac imaging, may be needed to find the cause of your pain.  TREATMENT   Treatment depends on what may be causing your chest pain. Treatment may include:   Acid blockers for heartburn.   Anti-inflammatory medicine.   Pain medicine for inflammatory conditions.   Antibiotics if an infection is present.   You may be advised to change lifestyle habits. This includes stopping smoking and avoiding alcohol, caffeine, and chocolate.   You may be advised to keep your head raised (elevated) when sleeping. This reduces the chance of acid going backward from your stomach into your esophagus.   Most of the time, nonspecific chest pain will improve within 2 to 3 days with rest and mild pain medicine.  HOME CARE INSTRUCTIONS   If antibiotics were prescribed, take your antibiotics as directed. Finish them even if you start to feel better.   For the next few  days, avoid physical activities that bring on chest pain. Continue physical activities as directed.   Do not smoke.   Avoid drinking alcohol.   Only take over-the-counter or prescription medicine for pain, discomfort, or fever as directed by your caregiver.   Follow your caregiver's suggestions for further testing if your chest pain does not go away.   Keep any follow-up appointments you made. If you do not go to an appointment, you could develop lasting (chronic) problems with pain. If there is any problem keeping an appointment, you must call to reschedule.  SEEK MEDICAL CARE IF:   You think you are having problems from the medicine you are taking. Read your medicine instructions carefully.   Your chest pain does not go away, even after treatment.   You develop a rash with blisters on your chest.  SEEK IMMEDIATE MEDICAL CARE IF:   You have increased chest pain or pain that spreads to your arm, neck, jaw, back, or abdomen.   You develop shortness of breath, an increasing cough, or you are coughing up blood.   You have severe back or abdominal pain, feel nauseous, or vomit.   You develop severe weakness, fainting, or chills.   You have a fever.  THIS IS AN EMERGENCY. Do not wait to see if the pain will go away. Get medical help at once. Call your local emergency services (911 in [place] Do not drive yourself to the hospital. MAKE SURE YOU:   Understand these instructions.     Will watch your condition.   Will get help right away if you are not doing well or get worse.  Document Released: 07/07/2005 Document Revised: 09/16/2011 Document Reviewed: 05/02/2008 [employer] Patient Information 2012 [employer] 

## 2012-02-08 DIAGNOSIS — E119 Type 2 diabetes mellitus without complications: Secondary | ICD-10-CM | POA: Diagnosis not present

## 2012-02-08 DIAGNOSIS — K3189 Other diseases of stomach and duodenum: Secondary | ICD-10-CM | POA: Diagnosis not present

## 2012-02-08 DIAGNOSIS — J029 Acute pharyngitis, unspecified: Secondary | ICD-10-CM | POA: Diagnosis not present

## 2012-02-08 DIAGNOSIS — H2010 Chronic iridocyclitis, unspecified eye: Secondary | ICD-10-CM | POA: Diagnosis not present

## 2012-02-08 DIAGNOSIS — R1013 Epigastric pain: Secondary | ICD-10-CM | POA: Diagnosis not present

## 2012-02-15 DIAGNOSIS — K219 Gastro-esophageal reflux disease without esophagitis: Secondary | ICD-10-CM | POA: Diagnosis not present

## 2012-02-15 DIAGNOSIS — R4589 Other symptoms and signs involving emotional state: Secondary | ICD-10-CM | POA: Diagnosis not present

## 2012-02-25 DIAGNOSIS — E119 Type 2 diabetes mellitus without complications: Secondary | ICD-10-CM | POA: Diagnosis not present

## 2012-02-25 DIAGNOSIS — H35359 Cystoid macular degeneration, unspecified eye: Secondary | ICD-10-CM | POA: Diagnosis not present

## 2012-02-25 DIAGNOSIS — H2010 Chronic iridocyclitis, unspecified eye: Secondary | ICD-10-CM | POA: Diagnosis not present

## 2012-03-01 DIAGNOSIS — R220 Localized swelling, mass and lump, head: Secondary | ICD-10-CM | POA: Diagnosis not present

## 2012-03-01 DIAGNOSIS — R221 Localized swelling, mass and lump, neck: Secondary | ICD-10-CM | POA: Diagnosis not present

## 2012-03-01 DIAGNOSIS — R300 Dysuria: Secondary | ICD-10-CM | POA: Diagnosis not present

## 2012-03-02 DIAGNOSIS — K1120 Sialoadenitis, unspecified: Secondary | ICD-10-CM | POA: Diagnosis not present

## 2012-03-06 DIAGNOSIS — K5900 Constipation, unspecified: Secondary | ICD-10-CM | POA: Diagnosis not present

## 2012-03-06 DIAGNOSIS — R1013 Epigastric pain: Secondary | ICD-10-CM | POA: Diagnosis not present

## 2012-03-07 DIAGNOSIS — E663 Overweight: Secondary | ICD-10-CM | POA: Diagnosis not present

## 2012-03-07 DIAGNOSIS — IMO0001 Reserved for inherently not codable concepts without codable children: Secondary | ICD-10-CM | POA: Diagnosis not present

## 2012-03-11 LAB — HM DIABETES FOOT EXAM

## 2012-03-20 DIAGNOSIS — I10 Essential (primary) hypertension: Secondary | ICD-10-CM | POA: Diagnosis not present

## 2012-03-20 DIAGNOSIS — I4949 Other premature depolarization: Secondary | ICD-10-CM | POA: Diagnosis not present

## 2012-03-20 DIAGNOSIS — Z0389 Encounter for observation for other suspected diseases and conditions ruled out: Secondary | ICD-10-CM | POA: Diagnosis not present

## 2012-03-20 DIAGNOSIS — R0602 Shortness of breath: Secondary | ICD-10-CM | POA: Diagnosis not present

## 2012-03-20 DIAGNOSIS — Z79899 Other long term (current) drug therapy: Secondary | ICD-10-CM | POA: Diagnosis not present

## 2012-03-21 ENCOUNTER — Emergency Department (HOSPITAL_COMMUNITY)
Admission: EM | Admit: 2012-03-21 | Discharge: 2012-03-21 | Disposition: A | Discharge status: Home / Self Care | Payer: Medicare Other | Attending: Emergency Medicine | Admitting: Emergency Medicine

## 2012-03-21 ENCOUNTER — Emergency Department (HOSPITAL_COMMUNITY): Payer: Medicare Other

## 2012-03-21 ENCOUNTER — Encounter (HOSPITAL_COMMUNITY): Payer: Self-pay | Admitting: *Deleted

## 2012-03-21 DIAGNOSIS — E119 Type 2 diabetes mellitus without complications: Secondary | ICD-10-CM | POA: Insufficient documentation

## 2012-03-21 DIAGNOSIS — R10819 Abdominal tenderness, unspecified site: Secondary | ICD-10-CM | POA: Insufficient documentation

## 2012-03-21 DIAGNOSIS — R059 Cough, unspecified: Secondary | ICD-10-CM | POA: Diagnosis not present

## 2012-03-21 DIAGNOSIS — Z79899 Other long term (current) drug therapy: Secondary | ICD-10-CM | POA: Diagnosis not present

## 2012-03-21 DIAGNOSIS — I10 Essential (primary) hypertension: Secondary | ICD-10-CM | POA: Diagnosis not present

## 2012-03-21 DIAGNOSIS — R109 Unspecified abdominal pain: Secondary | ICD-10-CM | POA: Diagnosis not present

## 2012-03-21 DIAGNOSIS — R0989 Other specified symptoms and signs involving the circulatory and respiratory systems: Secondary | ICD-10-CM | POA: Diagnosis not present

## 2012-03-21 DIAGNOSIS — R112 Nausea with vomiting, unspecified: Secondary | ICD-10-CM | POA: Diagnosis not present

## 2012-03-21 DIAGNOSIS — R05 Cough: Secondary | ICD-10-CM | POA: Diagnosis not present

## 2012-03-21 DIAGNOSIS — R197 Diarrhea, unspecified: Secondary | ICD-10-CM | POA: Diagnosis not present

## 2012-03-21 DIAGNOSIS — R1110 Vomiting, unspecified: Secondary | ICD-10-CM | POA: Diagnosis not present

## 2012-03-21 LAB — CBC
HCT: 41.2 % (ref 36.0–46.0)
Hemoglobin: 14.8 g/dL (ref 12.0–15.0)
MCH: 29.5 pg (ref 26.0–34.0)
MCHC: 35.9 g/dL (ref 30.0–36.0)
MCV: 82.1 fL (ref 78.0–100.0)
Platelets: 325 10*3/uL (ref 150–400)
RBC: 5.02 MIL/uL (ref 3.87–5.11)
RDW: 12.1 % (ref 11.5–15.5)
WBC: 9.4 10*3/uL (ref 4.0–10.5)

## 2012-03-21 LAB — URINALYSIS, ROUTINE W REFLEX MICROSCOPIC
Bilirubin Urine: NEGATIVE
Glucose, UA: NEGATIVE mg/dL
Hgb urine dipstick: NEGATIVE
Ketones, ur: NEGATIVE mg/dL
Nitrite: NEGATIVE
Protein, ur: NEGATIVE mg/dL
Specific Gravity, Urine: 1.013 (ref 1.005–1.030)
Urobilinogen, UA: 0.2 mg/dL (ref 0.0–1.0)
pH: 5 (ref 5.0–8.0)

## 2012-03-21 LAB — URINE MICROSCOPIC-ADD ON

## 2012-03-21 LAB — POCT I-STAT, CHEM 8
BUN: 18 mg/dL (ref 6–23)
Calcium, Ion: 1.08 mmol/L — ABNORMAL LOW (ref 1.12–1.32)
Chloride: 98 mEq/L (ref 96–112)
Creatinine, Ser: 0.9 mg/dL (ref 0.50–1.10)
Glucose, Bld: 227 mg/dL — ABNORMAL HIGH (ref 70–99)
HCT: 45 % (ref 36.0–46.0)
Hemoglobin: 15.3 g/dL — ABNORMAL HIGH (ref 12.0–15.0)
Potassium: 3.6 mEq/L (ref 3.5–5.1)
Sodium: 136 mEq/L (ref 135–145)
TCO2: 23 mmol/L (ref 0–100)

## 2012-03-21 LAB — HEPATIC FUNCTION PANEL
ALT: 38 U/L — ABNORMAL HIGH (ref 0–35)
AST: 47 U/L — ABNORMAL HIGH (ref 0–37)
Albumin: 4.2 g/dL (ref 3.5–5.2)
Alkaline Phosphatase: 52 U/L (ref 39–117)
Bilirubin, Direct: <0.1 mg/dL (ref 0.0–0.3)
Total Bilirubin: 0.4 mg/dL (ref 0.3–1.2)
Total Protein: 7.7 g/dL (ref 6.0–8.3)

## 2012-03-21 LAB — LIPASE, BLOOD: Lipase: 19 U/L (ref 11–59)

## 2012-03-21 MED ORDER — ONDANSETRON 8 MG PO TBDP: 8.0000 mg | ORAL_TABLET | Freq: Three times a day (TID) | ORAL | Status: AC | PRN

## 2012-03-21 MED ORDER — SODIUM CHLORIDE 0.9 % IV BOLUS (SEPSIS)
500.0000 mL | Freq: Once | INTRAVENOUS | Status: AC
  Administered 2012-03-21: 500 mL via INTRAVENOUS

## 2012-03-21 MED ORDER — ONDANSETRON HCL 4 MG/2ML IJ SOLN
4.0000 mg | Freq: Once | INTRAMUSCULAR | Status: AC
  Administered 2012-03-21: 4 mg via INTRAVENOUS
  Filled 2012-03-21: qty 2

## 2012-03-21 NOTE — ED Notes (Signed)
Pt reports pain in abdomin since last night. Pt saw PCP today and was sent to the ED for blood work and pain control.  Pt alert oriented x4.

## 2012-03-21 NOTE — ED Notes (Signed)
Rx x 1, pt voiced understanding to f/u with PCP and return for any worsening sx.

## 2012-03-21 NOTE — ED Provider Notes (Signed)
History     CSN: [id number]  Arrival date & time 03/21/12  1536   First MD Initiated Contact with Patient 03/21/12 1753      Chief Complaint  Patient presents with  . Abdominal Pain    (Consider location/radiation/quality/duration/timing/severity/associated sxs/prior treatment) Patient is a 66 y.o. female presenting with abdominal pain. The history is provided by the patient.  Abdominal Pain The primary symptoms of the illness include abdominal pain, nausea, vomiting and diarrhea. The primary symptoms of the illness do not include shortness of breath.  Symptoms associated with the illness do not include back pain.   patient had nausea vomiting diarrhea since last night. She states she saw her PCP who told her to come to the ER because she may have diverticulitis or be dehydrated. No fevers. She states she has crampy abdominal pain and middle of her abdomen. No fevers. She states this does not feel like her previous diverticulitis. She states her appetite has been down. No chest pain. No lightheadedness or dizziness.  Past Medical History  Diagnosis Date  . Hypertension   . Hyperlipidemia   . Diabetes mellitus   . Dermatitis   . Diverticulitis     recurrent  . Adenomatous colon polyp   . Obstructive sleep apnea on CPAP     pt does not use  . Heart palpitations   . PVC's (premature ventricular contractions)   . Esophageal stricture   . Hemorrhoids   . Colonic polyp   . Bigeminy   . Chest pain   . DVT (deep venous thrombosis)   . Abnormal liver function     Past Surgical History  Procedure Date  . Cholecystectomy   . Abdominal hysterectomy   . Knee surgery     Right  . Knee surgery     Left    Family History  Problem Relation Age of Onset  . Heart attack Mother 59  . Heart attack Brother 39  . Heart attack Maternal Grandmother 67  . Heart attack Maternal Grandfather 63    History  Substance Use Topics  . Smoking status: Never Smoker   . Smokeless tobacco:  Not on file  . Alcohol Use: No    OB History    Grav Para Term Preterm Abortions TAB SAB Ect Mult Living                  Review of Systems  Constitutional: Negative for activity change and appetite change.  HENT: Negative for neck stiffness.   Eyes: Negative for pain.  Respiratory: Negative for chest tightness and shortness of breath.   Cardiovascular: Negative for chest pain and leg swelling.  Gastrointestinal: Positive for nausea, vomiting, abdominal pain and diarrhea.  Genitourinary: Negative for flank pain.  Musculoskeletal: Negative for back pain.  Skin: Negative for rash.  Neurological: Negative for weakness, numbness and headaches.  Psychiatric/Behavioral: Negative for behavioral problems.    Allergies  Benicar; Beta adrenergic blockers; Cephalexin; Codeine; Colesevelam; Crestor; Fluvastatin sodium; Levemir; Lisinopril; Metformin and related; Metoprolol succinate; Metronidazole; Nortriptyline hcl; Rosuvastatin; Sulfamethoxazole w-trimethoprim; Trovafloxacin; and Verapamil  Home Medications   Current Outpatient Rx  Name Route Sig Dispense Refill  . ACETAMINOPHEN 500 MG PO TABS Oral Take 1,000 mg by mouth daily as needed. For pain or headache    . ALPRAZOLAM 0.25 MG PO TABS Oral Take 0.25 mg by mouth 3 (three) times daily as needed. For anxiety    . ASPIRIN 325 MG PO TABS Oral Take 325 mg by mouth  daily.    Marland Kitchen VITAMIN B-12 PO Oral Take 1 tablet by mouth daily.    Marland Kitchen DILTIAZEM HCL ER COATED BEADS 240 MG PO CP24 Oral Take 240 mg by mouth daily.    Marland Kitchen ESTROGENS CONJUGATED 0.625 MG PO TABS Oral Take 0.625 mg by mouth daily. Take daily for 21 days then do not take for 7 days.    Marland Kitchen FLUTICASONE PROPIONATE 50 MCG/ACT NA SUSP Nasal Place 2 sprays into the nose daily.    Marland Kitchen FLUVASTATIN SODIUM 20 MG PO CAPS Oral Take 20 mg by mouth at bedtime.    Marland Kitchen GLIPIZIDE ER 2.5 MG PO TB24 Oral Take 2.5 mg by mouth 2 (two) times daily.    Marland Kitchen HYDROCHLOROTHIAZIDE 25 MG PO TABS Oral Take 25 mg by mouth  daily.    Marland Kitchen METFORMIN HCL 1000 MG PO TABS Oral Take 1,000 mg by mouth 2 (two) times daily with a meal.    . NADOLOL 20 MG PO TABS Oral Take 10 mg by mouth daily.     Marland Kitchen OMEPRAZOLE 40 MG PO CPDR Oral Take 40 mg by mouth 2 (two) times daily.    Marland Kitchen RAMIPRIL 10 MG PO CAPS Oral Take 10 mg by mouth 2 (two) times daily.     . SUCRALFATE 1 G PO TABS Oral Take 1 g by mouth 4 (four) times daily.    Marland Kitchen ONDANSETRON 8 MG PO TBDP Oral Take 1 tablet (8 mg total) by mouth every 8 (eight) hours as needed for nausea. 20 tablet 0    BP 128/64  Pulse 79  Temp(Src) 98.6 F (37 C) (Oral)  Resp 18  SpO2 98%  Physical Exam  Nursing note and vitals reviewed. Constitutional: She is oriented to person, place, and time. She appears well-developed and well-nourished.  HENT:  Head: Normocephalic and atraumatic.  Eyes: EOM are normal. Pupils are equal, round, and reactive to light.  Neck: Normal range of motion. Neck supple.  Cardiovascular: Regular rhythm and normal heart sounds.   No murmur heard. Pulmonary/Chest: Effort normal and breath sounds normal. No respiratory distress. She has no wheezes. She has no rales.  Abdominal: Soft. Bowel sounds are normal. She exhibits no distension. There is tenderness. There is no rebound and no guarding.       Minimal mid abdominal tenderness without rebound or guarding. No tenderness left lower quadrant.  Musculoskeletal: Normal range of motion.  Neurological: She is alert and oriented to person, place, and time. No cranial nerve deficit.  Skin: Skin is warm and dry.  Psychiatric: She has a normal mood and affect. Her speech is normal.    ED Course  Procedures (including critical care time)  Labs Reviewed  URINALYSIS, ROUTINE W REFLEX MICROSCOPIC - Abnormal; Notable for the following:    APPearance CLOUDY (*)    Leukocytes, UA TRACE (*)    All other components within normal limits  POCT I-STAT, CHEM 8 - Abnormal; Notable for the following:    Glucose, Bld 227 (*)     Calcium, Ion 1.08 (*)    Hemoglobin 15.3 (*)    All other components within normal limits  HEPATIC FUNCTION PANEL - Abnormal; Notable for the following:    AST 47 (*)    ALT 38 (*)    All other components within normal limits  URINE MICROSCOPIC-ADD ON - Abnormal; Notable for the following:    Squamous Epithelial / LPF MANY (*)    Bacteria, UA MANY (*)    All other  components within normal limits  CBC  LIPASE, BLOOD  URINE CULTURE   Dg Chest 2 View  03/21/2012  *RADIOLOGY REPORT*  Clinical Data: Chest congestion and cough  CHEST - 2 VIEW  Comparison: 03/08/2012  Findings: Heart size is normal.  There is no pleural effusion or edema.  Chronic appearing bronchitic changes noted.  No airspace consolidation.  There is mild multilevel thoracic spondylosis identified.  IMPRESSION:  1.  No acute cardiopulmonary abnormalities.  Original Report Authenticated By: Rosealee Albee, M.D.   Dg Abd 2 Views  03/21/2012  *RADIOLOGY REPORT*  Clinical Data: Vomiting.  ABDOMEN - 2 VIEW  Comparison: 03/06/2012  Findings: The bowel gas pattern appears nonobstructed.  A few nondilated air-filled loops of small bowel are noted.  There is gas and stool noted within the colon up to the level of the rectum. On the upright images there are a few colonic fluid levels within the right abdomen.  IMPRESSION:  1.  Nonobstructive bowel gas pattern.  Original Report Authenticated By: Rosealee Albee, M.D.   Dg Abd Acute W/chest  03/21/2012  *RADIOLOGY REPORT*  Clinical Data: Abdominal pain, vomiting and diarrhea  ACUTE ABDOMEN SERIES (ABDOMEN 2 VIEW & CHEST 1 VIEW)  Comparison: Chest x-ray of 02/07/2012  Findings: The lungs are clear.  The mediastinal contours appear normal.  The heart is within normal limits in size.  There are degenerative changes in the thoracic spine.  IMPRESSION: No active lung disease.  Original Report Authenticated By: Juline Patch, M.D.     1. Nausea vomiting and diarrhea       MDM  Nausea  vomiting diarrhea. Benign abdominal exam. Doubt diverticulitis. Patient feels better and is tolerating orals. She'll be discharged home. She does have many bacteria in the urine and a culture was sent. She'll not be empirically treated.        Juliet Rude. Rubin Payor, MD 03/21/12 2125

## 2012-03-21 NOTE — Discharge Instructions (Signed)
Diet for Diarrhea, Adult Having frequent, runny stools (diarrhea) has many causes. Diarrhea may be caused or worsened by food or drink. Diarrhea may be relieved by changing your diet. IF YOU ARE NOT TOLERATING SOLID FOODS:  Drink enough water and fluids to keep your urine clear or pale yellow.   Avoid sugary drinks and sodas as well as milk-based beverages.   Avoid beverages containing caffeine and alcohol.   You may try rehydrating beverages. You can make your own by following this recipe:    tsp table salt.    tsp baking soda.   ? tsp salt substitute (potassium chloride).   1 tbs + 1 tsp sugar.   1 qt water.  As your stools become more solid, you can start eating solid foods. Add foods one at a time. If a certain food causes your diarrhea to get worse, avoid that food and try other foods. A low fiber, low-fat, and lactose-free diet is recommended. Small, frequent meals may be better tolerated.  Starches  Allowed:  White, French, and pita breads, plain rolls, buns, bagels. Plain muffins, matzo. Soda, saltine, or graham crackers. Pretzels, melba toast, zwieback. Cooked cereals made with water: cornmeal, farina, cream cereals. Dry cereals: refined corn, wheat, rice. Potatoes prepared any way without skins, refined macaroni, spaghetti, noodles, refined rice.   Avoid:  Bread, rolls, or crackers made with whole wheat, multi-grains, rye, bran seeds, nuts, or coconut. Corn tortillas or taco shells. Cereals containing whole grains, multi-grains, bran, coconut, nuts, or raisins. Cooked or dry oatmeal. Coarse wheat cereals, granola. Cereals advertised as "high-fiber." Potato skins. Whole grain pasta, wild or brown rice. Popcorn. Sweet potatoes/yams. Sweet rolls, doughnuts, waffles, pancakes, sweet breads.  Vegetables  Allowed: Strained tomato and vegetable juices. Most well-cooked and canned vegetables without seeds. Fresh: Tender lettuce, cucumber without the skin, cabbage, spinach, bean  sprouts.   Avoid: Fresh, cooked, or canned: Artichokes, baked beans, beet greens, broccoli, Brussels sprouts, corn, kale, legumes, peas, sweet potatoes. Cooked: Green or red cabbage, spinach. Avoid large servings of any vegetables, because vegetables shrink when cooked, and they contain more fiber per serving than fresh vegetables.  Fruit  Allowed: All fruit juices except prune juice. Cooked or canned: Apricots, applesauce, cantaloupe, cherries, fruit cocktail, grapefruit, grapes, kiwi, mandarin oranges, peaches, pears, plums, watermelon. Fresh: Apples without skin, ripe banana, grapes, cantaloupe, cherries, grapefruit, peaches, oranges, plums. Keep servings limited to  cup or 1 piece.   Avoid: Fresh: Apple with skin, apricots, mango, pears, raspberries, strawberries. Prune juice, stewed or dried prunes. Dried fruits, raisins, dates. Large servings of all fresh fruits.  Meat and Meat Substitutes  Allowed: Ground or well-cooked tender beef, ham, veal, lamb, pork, or poultry. Eggs, plain cheese. Fish, oysters, shrimp, lobster, other seafoods. Liver, organ meats.   Avoid: Tough, fibrous meats with gristle. Peanut butter, smooth or chunky. Cheese, nuts, seeds, legumes, dried peas, beans, lentils.  Milk  Allowed: Yogurt, lactose-free milk, kefir, drinkable yogurt, buttermilk, soy milk.   Avoid: Milk, chocolate milk, beverages made with milk, such as milk shakes.  Soups  Allowed: Bouillon, broth, or soups made from allowed foods. Any strained soup.   Avoid: Soups made from vegetables that are not allowed, cream or milk-based soups.  Desserts and Sweets  Allowed: Sugar-free gelatin, sugar-free frozen ice pops made without sugar alcohol.   Avoid: Plain cakes and cookies, pie made with allowed fruit, pudding, custard, cream pie. Gelatin, fruit, ice, sherbet, frozen ice pops. Ice cream, ice milk without nuts. Plain hard candy,   honey, jelly, molasses, syrup, sugar, chocolate syrup, gumdrops,  marshmallows.  Fats and Oils  Allowed: Avoid any fats and oils.   Avoid: Seeds, nuts, olives, avocados. Margarine, butter, cream, mayonnaise, salad oils, plain salad dressings made from allowed foods. Plain gravy, crisp bacon without rind.  Beverages  Allowed: Water, decaffeinated teas, oral rehydration solutions, sugar-free beverages.   Avoid: Fruit juices, caffeinated beverages (coffee, tea, soda or pop), alcohol, sports drinks, or lemon-lime soda or pop.  Condiments  Allowed: Ketchup, mustard, horseradish, vinegar, cream sauce, cheese sauce, cocoa powder. Spices in moderation: allspice, basil, bay leaves, celery powder or leaves, cinnamon, cumin powder, curry powder, ginger, mace, marjoram, onion or garlic powder, oregano, paprika, parsley flakes, ground pepper, rosemary, sage, savory, tarragon, thyme, turmeric.   Avoid: Coconut, honey.  Weight Monitoring: Weigh yourself every day. You should weigh yourself in the morning after you urinate and before you eat breakfast. Wear the same amount of clothing when you weigh yourself. Record your weight daily. Bring your recorded weights to your clinic visits. Tell your caregiver right away if you have gained 3 lb/1.4 kg or more in 1 day, 5 lb/2.3 kg in a week, or whatever amount you were told to report. SEEK IMMEDIATE MEDICAL CARE IF:   You are unable to keep fluids down.   You start to throw up (vomit) or diarrhea keeps coming back (persistent).   Abdominal pain develops, increases, or can be felt in one place (localizes).   You have an oral temperature above 102 F (38.9 C), not controlled by medicine.   Diarrhea contains blood or mucus.   You develop excessive weakness, dizziness, fainting, or extreme thirst.  MAKE SURE YOU:   Understand these instructions.   Will watch your condition.   Will get help right away if you are not doing well or get worse.  Document Released: 12/18/2003 Document Revised: 09/16/2011 Document Reviewed:  04/10/2009 [employer] Patient Information 2012 [employer].Nausea and Vomiting Nausea is a sick feeling that often comes before throwing up (vomiting). Vomiting is a reflex where stomach contents come out of your mouth. Vomiting can cause severe loss of body fluids (dehydration). Children and elderly adults can become dehydrated quickly, especially if they also have diarrhea. Nausea and vomiting are symptoms of a condition or disease. It is important to find the cause of your symptoms. CAUSES   Direct irritation of the stomach lining. This irritation can result from increased acid production (gastroesophageal reflux disease), infection, food poisoning, taking certain medicines (such as nonsteroidal anti-inflammatory drugs), alcohol use, or tobacco use.   Signals from the brain.These signals could be caused by a headache, heat exposure, an inner ear disturbance, increased pressure in the brain from injury, infection, a tumor, or a concussion, pain, emotional stimulus, or metabolic problems.   An obstruction in the gastrointestinal tract (bowel obstruction).   Illnesses such as diabetes, hepatitis, gallbladder problems, appendicitis, kidney problems, cancer, sepsis, atypical symptoms of a heart attack, or eating disorders.   Medical treatments such as chemotherapy and radiation.   Receiving medicine that makes you sleep (general anesthetic) during surgery.  DIAGNOSIS Your caregiver may ask for tests to be done if the problems do not improve after a few days. Tests may also be done if symptoms are severe or if the reason for the nausea and vomiting is not clear. Tests may include:  Urine tests.   Blood tests.   Stool tests.   Cultures (to look for evidence of infection).   X-rays or other imaging   studies.  Test results can help your caregiver make decisions about treatment or the need for additional tests. TREATMENT You need to stay well hydrated. Drink frequently but in small  amounts.You may wish to drink water, sports drinks, clear broth, or eat frozen ice pops or gelatin dessert to help stay hydrated.When you eat, eating slowly may help prevent nausea.There are also some antinausea medicines that may help prevent nausea. HOME CARE INSTRUCTIONS   Take all medicine as directed by your caregiver.   If you do not have an appetite, do not force yourself to eat. However, you must continue to drink fluids.   If you have an appetite, eat a normal diet unless your caregiver tells you differently.   Eat a variety of complex carbohydrates (rice, wheat, potatoes, bread), lean meats, yogurt, fruits, and vegetables.   Avoid high-fat foods because they are more difficult to digest.   Drink enough water and fluids to keep your urine clear or pale yellow.   If you are dehydrated, ask your caregiver for specific rehydration instructions. Signs of dehydration may include:   Severe thirst.   Dry lips and mouth.   Dizziness.   Dark urine.   Decreasing urine frequency and amount.   Confusion.   Rapid breathing or pulse.  SEEK IMMEDIATE MEDICAL CARE IF:   You have blood or brown flecks (like coffee grounds) in your vomit.   You have black or bloody stools.   You have a severe headache or stiff neck.   You are confused.   You have severe abdominal pain.   You have chest pain or trouble breathing.   You do not urinate at least once every 8 hours.   You develop cold or clammy skin.   You continue to vomit for longer than 24 to 48 hours.   You have a fever.  MAKE SURE YOU:   Understand these instructions.   Will watch your condition.   Will get help right away if you are not doing well or get worse.  Document Released: 09/27/2005 Document Revised: 09/16/2011 Document Reviewed: 02/24/2011 [employer] Patient Information 2012 [employer]. 

## 2012-03-21 NOTE — ED Notes (Signed)
Asked pt for urine sample, states she will try and ring call bell when needs to urinate.

## 2012-03-21 NOTE — ED Notes (Signed)
Sent to ed for further eval of llq pain and bloating. Pt is having diarrhea since last night. No vomiting since last night. Skin w/d, resp e/u. Hx of diverticulitis. Denies blood in stool

## 2012-03-23 LAB — URINE CULTURE
Colony Count: NO GROWTH
Culture  Setup Time: 201306112250
Culture: NO GROWTH

## 2012-03-24 ENCOUNTER — Ambulatory Visit: Payer: Medicare Other | Admitting: Endocrinology

## 2012-03-29 DIAGNOSIS — Z79899 Other long term (current) drug therapy: Secondary | ICD-10-CM | POA: Diagnosis not present

## 2012-03-29 DIAGNOSIS — R0602 Shortness of breath: Secondary | ICD-10-CM | POA: Diagnosis not present

## 2012-03-29 DIAGNOSIS — I10 Essential (primary) hypertension: Secondary | ICD-10-CM | POA: Diagnosis not present

## 2012-03-29 DIAGNOSIS — I4949 Other premature depolarization: Secondary | ICD-10-CM | POA: Diagnosis not present

## 2012-03-29 DIAGNOSIS — Z0389 Encounter for observation for other suspected diseases and conditions ruled out: Secondary | ICD-10-CM | POA: Diagnosis not present

## 2012-03-31 DIAGNOSIS — E663 Overweight: Secondary | ICD-10-CM | POA: Diagnosis not present

## 2012-03-31 DIAGNOSIS — IMO0001 Reserved for inherently not codable concepts without codable children: Secondary | ICD-10-CM | POA: Diagnosis not present

## 2012-04-04 DIAGNOSIS — H2010 Chronic iridocyclitis, unspecified eye: Secondary | ICD-10-CM | POA: Diagnosis not present

## 2012-04-04 DIAGNOSIS — H35359 Cystoid macular degeneration, unspecified eye: Secondary | ICD-10-CM | POA: Diagnosis not present

## 2012-04-05 DIAGNOSIS — I4949 Other premature depolarization: Secondary | ICD-10-CM | POA: Diagnosis not present

## 2012-04-05 DIAGNOSIS — R0602 Shortness of breath: Secondary | ICD-10-CM | POA: Diagnosis not present

## 2012-04-05 DIAGNOSIS — I119 Hypertensive heart disease without heart failure: Secondary | ICD-10-CM | POA: Diagnosis not present

## 2012-04-18 ENCOUNTER — Encounter: Payer: Self-pay | Admitting: Endocrinology

## 2012-04-18 ENCOUNTER — Ambulatory Visit (INDEPENDENT_AMBULATORY_CARE_PROVIDER_SITE_OTHER): Payer: Medicare Other | Admitting: Endocrinology

## 2012-04-18 VITALS — BP 158/88 | HR 92 | Temp 97.8°F | Ht 67.0 in | Wt 180.0 lb

## 2012-04-18 DIAGNOSIS — E119 Type 2 diabetes mellitus without complications: Secondary | ICD-10-CM | POA: Diagnosis not present

## 2012-04-18 NOTE — Patient Instructions (Addendum)
good diet and exercise habits significanly improve the control of your diabetes.  please let me know if you wish to be referred to a dietician.  high blood sugar is very risky to your health.  you should see an eye doctor every year. controlling your blood pressure and cholesterol drastically reduces the damage diabetes does to your body.  this also applies to quitting smoking.  please discuss these with your doctor.  you should take an aspirin every day, unless you have been advised by a doctor not to. check your blood sugar once a day.  vary the time of day when you check, between before the 3 meals, and at bedtime.  also check if you have symptoms of your blood sugar being too high or too low.  please keep a record of the readings and bring it to your next appointment here.  please call us sooner if your blood sugar goes below 70, or if it stays over 200. Please continue the same metformin Change glipizide to "tradjenta," 5 mg daily.  Here are some samples. Please come back for a follow-up appointment for 1 month.

## 2012-04-18 NOTE — Progress Notes (Signed)
Subjective:    Patient ID: Alice Cole, female    DOB: 11-13-1945, 66 y.o.   MRN: [id number]  HPI pt states 17 years h/o dm.  she is unaware of any chronic complications.  she took insulin x 5 months.  She went off insulin, and wants to control glucose with orals if possible.  pt says her diet and exercise are "fair." When she was on insulin (levemir and lantus, at different times).  She says she had side-effects.  While on these, she had 1 day of slight erythema at the site of the injection, but no assoc itching.  On 2 orals, she says cbg's vary from 87-200's.   Past Medical History  Diagnosis Date  . Hypertension   . Hyperlipidemia   . Diabetes mellitus   . Dermatitis   . Diverticulitis     recurrent  . Adenomatous colon polyp   . Obstructive sleep apnea on CPAP     pt does not use  . Heart palpitations   . PVC's (premature ventricular contractions)   . Esophageal stricture   . Hemorrhoids   . Colonic polyp   . Bigeminy   . Chest pain   . DVT (deep venous thrombosis)   . Abnormal liver function   . GERD (gastroesophageal reflux disease)     Past Surgical History  Procedure Date  . Cholecystectomy   . Abdominal hysterectomy   . Knee surgery     Right  . Knee surgery     Left    History   Social History  . Marital Status: Married    Spouse Name: N/A    Number of Children: N/A  . Years of Education: N/A   Occupational History  . Retired    Social History Main Topics  . Smoking status: Never Smoker   . Smokeless tobacco: Not on file  . Alcohol Use: No  . Drug Use: No  . Sexually Active: Not on file   Other Topics Concern  . Not on file   Social History Narrative  . No narrative on file    Current Outpatient Prescriptions on File Prior to Visit  Medication Sig Dispense Refill  . ALPRAZolam (XANAX) 0.25 MG tablet Take 0.25 mg by mouth 3 (three) times daily as needed. For anxiety      . aspirin 325 MG tablet Take 325 mg by mouth daily.      Marland Kitchen diltiazem  (CARDIZEM CD) 240 MG 24 hr capsule Take 240 mg by mouth daily.      Marland Kitchen estrogens, conjugated, (PREMARIN) 0.625 MG tablet Take 0.625 mg by mouth daily. Take daily for 21 days then do not take for 7 days.      . fluvastatin (LESCOL) 20 MG capsule Take 20 mg by mouth at bedtime.      Marland Kitchen glipiZIDE (GLUCOTROL XL) 2.5 MG 24 hr tablet Take 2.5 mg by mouth 2 (two) times daily.      . hydrochlorothiazide (HYDRODIURIL) 25 MG tablet Take 25 mg by mouth daily.      . metFORMIN (GLUCOPHAGE) 1000 MG tablet Take 1,000 mg by mouth 2 (two) times daily with a meal.      . nadolol (CORGARD) 20 MG tablet Take 10 mg by mouth daily.       Marland Kitchen omeprazole (PRILOSEC) 40 MG capsule Take 40 mg by mouth 2 (two) times daily.      . ramipril (ALTACE) 10 MG capsule Take 10 mg by mouth 2 (two) times daily.       Marland Kitchen  sucralfate (CARAFATE) 1 G tablet Take 1 g by mouth 4 (four) times daily.      Marland Kitchen acetaminophen (TYLENOL) 500 MG tablet Take 1,000 mg by mouth daily as needed. For pain or headache      . Cyanocobalamin (VITAMIN B-12 PO) Take 1 tablet by mouth daily.      . fluticasone (FLONASE) 50 MCG/ACT nasal spray Place 2 sprays into the nose daily.        Allergies  Allergen Reactions  . Adhesive (Tape)     Band Aids  . Benicar (Olmesartan Medoxomil) Nausea Only and Other (See Comments)    Urinary difficulty  . Beta Adrenergic Blockers Other (See Comments)    Reaction unknown  . Cartia Xt (Diltiazem) Itching  . Cephalexin     REACTION: nausea, vomiting, diarrhea  . Codeine Other (See Comments)    tachycardia  . Colesevelam     REACTION: nausea  . Crestor (Rosuvastatin Calcium) Other (See Comments)    Leg cramps  . Flagyl (Metronidazole) Other (See Comments)    nausea  . Fluvastatin Sodium     REACTION: itching  . Levemir (Insulin Detemir) Itching and Other (See Comments)    Bad mood  . Lisinopril     REACTION: nausea  . Metformin And Related Other (See Comments)    Reaction unknown  . Metoprolol Succinate Other  (See Comments)    Reaction unknown  . Nortriptyline Hcl Other (See Comments)    Reaction unknown  . Pamelor (Nortriptyline Hcl)     Nightmares  . Rosuvastatin     REACTION: cramps  . Sulfamethoxazole W-Trimethoprim     REACTION: nausea, irreg. heartrate  . Verapamil Swelling and Other (See Comments)    Chest pain  . Welchol (Colesevelam Hcl) Nausea And Vomiting  . Trovan (Trovafloxacin) Rash and Other (See Comments)    Family History  Problem Relation Age of Onset  . Heart attack Mother 66  . Heart attack Brother 34  . Heart attack Maternal Grandmother 67  . Heart attack Maternal Grandfather 63  . Cancer Daughter     Ovarian Cancer  DM: brother  BP 158/88  Pulse 92  Temp 97.8 F (36.6 C) (Oral)  Ht 5\' 7"  (1.702 m)  Wt 180 lb (81.647 kg)  BMI 28.19 kg/m2  SpO2 96%  Review of Systems She has lost 35 lbs x 6 mos.  denies blurry vision, headache, chest pain, sob, n/v, urinary frequency, cramps, memory loss, depression, menopausal sxs, rhinorrhea, and easy bruising.  She has myalgias and excessive diaphoresis.      Objective:   Physical Exam VS: see vs page GEN: no distress HEAD: head: no deformity eyes: no periorbital swelling, no proptosis external nose and ears are normal mouth: no lesion seen NECK: supple, thyroid is not enlarged CHEST WALL: no deformity LUNGS:  Clear to auscultation. CV: reg rate and rhythm, no murmur.   ABD: abdomen is soft, nontender.  no hepatosplenomegaly.  not distended.  no hernia.   MUSCULOSKELETAL: muscle bulk and strength are grossly normal.  no obvious joint swelling.  gait is normal and steady EXTEMITIES: no deformity.  no ulcer on the feet.  feet are of normal color and temp.  no edema PULSES: dorsalis pedis intact bilat.  no carotid bruit NEURO:  cn 2-12 grossly intact.   readily moves all 4's.  sensation is intact to touch on the feet SKIN:  Normal texture and temperature.  No rash or suspicious lesion is visible.   NODES:  None  palpable at the neck.   PSYCH: alert, oriented x3.  Does not appear anxious nor depressed.       Assessment & Plan:  DM.  Needs increased rx, if it can be done with a regimen that avoids or minimizes hypoglycemia. Weight loss.  This may be due to DM, but it will help control. Itching, ? Due to insulin injections, but uncertain etiology

## 2012-04-19 DIAGNOSIS — R0602 Shortness of breath: Secondary | ICD-10-CM | POA: Diagnosis not present

## 2012-04-19 DIAGNOSIS — I4949 Other premature depolarization: Secondary | ICD-10-CM | POA: Diagnosis not present

## 2012-04-19 DIAGNOSIS — I119 Hypertensive heart disease without heart failure: Secondary | ICD-10-CM | POA: Diagnosis not present

## 2012-04-21 DIAGNOSIS — K5900 Constipation, unspecified: Secondary | ICD-10-CM | POA: Diagnosis not present

## 2012-04-21 DIAGNOSIS — K219 Gastro-esophageal reflux disease without esophagitis: Secondary | ICD-10-CM | POA: Diagnosis not present

## 2012-04-21 DIAGNOSIS — R3911 Hesitancy of micturition: Secondary | ICD-10-CM | POA: Diagnosis not present

## 2012-04-21 DIAGNOSIS — R1032 Left lower quadrant pain: Secondary | ICD-10-CM | POA: Diagnosis not present

## 2012-04-25 DIAGNOSIS — H2010 Chronic iridocyclitis, unspecified eye: Secondary | ICD-10-CM | POA: Diagnosis not present

## 2012-05-15 DIAGNOSIS — R109 Unspecified abdominal pain: Secondary | ICD-10-CM | POA: Diagnosis not present

## 2012-05-16 ENCOUNTER — Ambulatory Visit (INDEPENDENT_AMBULATORY_CARE_PROVIDER_SITE_OTHER): Payer: Medicare Other | Admitting: Endocrinology

## 2012-05-16 ENCOUNTER — Encounter: Payer: Self-pay | Admitting: Endocrinology

## 2012-05-16 VITALS — BP 140/82 | HR 100 | Temp 97.7°F | Wt 177.0 lb

## 2012-05-16 DIAGNOSIS — E119 Type 2 diabetes mellitus without complications: Secondary | ICD-10-CM | POA: Diagnosis not present

## 2012-05-16 MED ORDER — PIOGLITAZONE HCL 45 MG PO TABS: 45.0000 mg | ORAL_TABLET | Freq: Every day | ORAL | Status: DC

## 2012-05-16 NOTE — Patient Instructions (Addendum)
Stay-off the tradjenta Resume actos.  i have sent a prescription to your pharmacy. Please come back for a follow-up appointment for 1 month.

## 2012-05-16 NOTE — Progress Notes (Signed)
Subjective:    Patient ID: Alice Cole, female    DOB: April 16, 1946, 66 y.o.   MRN: [id number]  HPI pt returns for f/u of type 2 DM (dx'ed 1996; no chronic complications; she took insulin early in 2013, but she now wants to rx with orals if at all possible).   On the tradjenta, she got sob, so she stopped it.  she brings a record of her cbg's which i have reviewed today.  It varies from 140-300, but most are in the high-100's.   Past Medical History  Diagnosis Date  . Hypertension   . Hyperlipidemia   . Diabetes mellitus   . Dermatitis   . Diverticulitis     recurrent  . Adenomatous colon polyp   . Obstructive sleep apnea on CPAP     pt does not use  . Heart palpitations   . PVC's (premature ventricular contractions)   . Esophageal stricture   . Hemorrhoids   . Colonic polyp   . Bigeminy   . Chest pain   . DVT (deep venous thrombosis)   . Abnormal liver function   . GERD (gastroesophageal reflux disease)     Past Surgical History  Procedure Date  . Cholecystectomy     aprox 10 years ago  . Abdominal hysterectomy   . Knee surgery     Right  . Knee surgery     Left    History   Social History  . Marital Status: Married    Spouse Name: N/A    Number of Children: 2  . Years of Education: 11   Occupational History  . Retired    Social History Main Topics  . Smoking status: Never Smoker   . Smokeless tobacco: Not on file  . Alcohol Use: No  . Drug Use: No  . Sexually Active: Not on file   Other Topics Concern  . Not on file   Social History Narrative   Regular exercise-yesCaffeine use-yes    Current Outpatient Prescriptions on File Prior to Visit  Medication Sig Dispense Refill  . acetaminophen (TYLENOL) 500 MG tablet Take 1,000 mg by mouth daily as needed. For pain or headache      . ALPRAZolam (XANAX) 0.25 MG tablet Take 0.25 mg by mouth 3 (three) times daily as needed. For anxiety      . aspirin 325 MG tablet Take 325 mg by mouth daily.      .  Cyanocobalamin (VITAMIN B-12 PO) Take 1 tablet by mouth daily.      Marland Kitchen diltiazem (CARDIZEM CD) 240 MG 24 hr capsule Take 240 mg by mouth daily.      Marland Kitchen estrogens, conjugated, (PREMARIN) 0.625 MG tablet Take 0.625 mg by mouth daily. Take daily for 21 days then do not take for 7 days.      . fluticasone (FLONASE) 50 MCG/ACT nasal spray Place 2 sprays into the nose daily.      . fluvastatin (LESCOL) 20 MG capsule Take 20 mg by mouth at bedtime.      . hydrochlorothiazide (HYDRODIURIL) 25 MG tablet Take 25 mg by mouth daily.      . metFORMIN (GLUCOPHAGE) 1000 MG tablet Take 1,000 mg by mouth 2 (two) times daily with a meal.      . nadolol (CORGARD) 20 MG tablet Take 10 mg by mouth daily.       Marland Kitchen omeprazole (PRILOSEC) 40 MG capsule Take 40 mg by mouth 2 (two) times daily.      Marland Kitchen  ramipril (ALTACE) 10 MG capsule Take 10 mg by mouth 2 (two) times daily.       . sucralfate (CARAFATE) 1 G tablet Take 1 g by mouth 4 (four) times daily.      Marland Kitchen triamcinolone cream (KENALOG) 0.1 % Apply topically 2 (two) times daily.      . pioglitazone (ACTOS) 45 MG tablet Take 1 tablet (45 mg total) by mouth daily.  30 tablet  11    Allergies  Allergen Reactions  . Adhesive (Tape)     Band Aids  . Benicar (Olmesartan Medoxomil) Nausea Only and Other (See Comments)    Urinary difficulty  . Beta Adrenergic Blockers Other (See Comments)    Reaction unknown  . Cartia Xt (Diltiazem) Itching  . Cephalexin     REACTION: nausea, vomiting, diarrhea  . Codeine Other (See Comments)    tachycardia  . Colesevelam     REACTION: nausea  . Crestor (Rosuvastatin Calcium) Other (See Comments)    Leg cramps  . Flagyl (Metronidazole) Other (See Comments)    nausea  . Fluvastatin Sodium     REACTION: itching  . Levemir (Insulin Detemir) Itching and Other (See Comments)    Bad mood  . Lisinopril     REACTION: nausea  . Metformin And Related Other (See Comments)    Reaction unknown  . Metoprolol Succinate Other (See Comments)      Reaction unknown  . Nortriptyline Hcl Other (See Comments)    Reaction unknown  . Pamelor (Nortriptyline Hcl)     Nightmares  . Rosuvastatin     REACTION: cramps  . Sulfamethoxazole W-Trimethoprim     REACTION: nausea, irreg. heartrate  . Tradjenta (Linagliptin)     sob  . Verapamil Swelling and Other (See Comments)    Chest pain  . Welchol (Colesevelam Hcl) Nausea And Vomiting  . Trovan (Trovafloxacin) Rash and Other (See Comments)    Family History  Problem Relation Age of Onset  . Heart attack Mother 61  . Heart attack Brother 13  . Heart attack Maternal Grandmother 67  . Heart attack Maternal Grandfather 63  . Cancer Daughter     Ovarian Cancer  . Diabetes Other     Grandparent  . Heart disease Other     Grandparent    BP 140/82  Pulse 100  Temp 97.7 F (36.5 C) (Oral)  Wt 177 lb (80.287 kg)  SpO2 97%  Review of Systems denies hypoglycemia.      Objective:   Physical Exam VITAL SIGNS:  See vs page GENERAL: no distress Ext: no edema PSYCH: Alert and oriented x 3.  Does not appear anxious nor depressed.       Assessment & Plan:  DM, needs increased rx.  therapy is limited by multiple perceived drug intolerances.

## 2012-05-17 DIAGNOSIS — H2000 Unspecified acute and subacute iridocyclitis: Secondary | ICD-10-CM | POA: Diagnosis not present

## 2012-05-17 DIAGNOSIS — E1139 Type 2 diabetes mellitus with other diabetic ophthalmic complication: Secondary | ICD-10-CM | POA: Diagnosis not present

## 2012-05-24 ENCOUNTER — Other Ambulatory Visit: Payer: Self-pay | Admitting: Family Medicine

## 2012-05-24 DIAGNOSIS — Z1231 Encounter for screening mammogram for malignant neoplasm of breast: Secondary | ICD-10-CM

## 2012-06-14 ENCOUNTER — Ambulatory Visit
Admission: RE | Admit: 2012-06-14 | Discharge: 2012-06-14 | Disposition: A | Discharge status: Home / Self Care | Payer: Medicare Other | Source: Ambulatory Visit | Attending: Family Medicine | Admitting: Family Medicine

## 2012-06-14 DIAGNOSIS — Z1231 Encounter for screening mammogram for malignant neoplasm of breast: Secondary | ICD-10-CM

## 2012-06-14 DIAGNOSIS — R1032 Left lower quadrant pain: Secondary | ICD-10-CM | POA: Diagnosis not present

## 2012-06-14 DIAGNOSIS — M542 Cervicalgia: Secondary | ICD-10-CM | POA: Diagnosis not present

## 2012-06-14 DIAGNOSIS — R300 Dysuria: Secondary | ICD-10-CM | POA: Diagnosis not present

## 2012-06-14 DIAGNOSIS — R5383 Other fatigue: Secondary | ICD-10-CM | POA: Diagnosis not present

## 2012-06-14 DIAGNOSIS — R5381 Other malaise: Secondary | ICD-10-CM | POA: Diagnosis not present

## 2012-06-19 DIAGNOSIS — IMO0001 Reserved for inherently not codable concepts without codable children: Secondary | ICD-10-CM | POA: Diagnosis not present

## 2012-06-19 DIAGNOSIS — E663 Overweight: Secondary | ICD-10-CM | POA: Diagnosis not present

## 2012-06-20 ENCOUNTER — Ambulatory Visit: Payer: Medicare Other | Admitting: Endocrinology

## 2012-06-22 ENCOUNTER — Ambulatory Visit: Payer: Medicare Other | Admitting: Endocrinology

## 2012-07-14 DIAGNOSIS — E119 Type 2 diabetes mellitus without complications: Secondary | ICD-10-CM | POA: Diagnosis not present

## 2012-07-14 DIAGNOSIS — H15009 Unspecified scleritis, unspecified eye: Secondary | ICD-10-CM | POA: Diagnosis not present

## 2012-07-14 DIAGNOSIS — H2010 Chronic iridocyclitis, unspecified eye: Secondary | ICD-10-CM | POA: Diagnosis not present

## 2012-07-14 DIAGNOSIS — H35359 Cystoid macular degeneration, unspecified eye: Secondary | ICD-10-CM | POA: Diagnosis not present

## 2012-07-25 DIAGNOSIS — R109 Unspecified abdominal pain: Secondary | ICD-10-CM | POA: Diagnosis not present

## 2012-07-25 DIAGNOSIS — R300 Dysuria: Secondary | ICD-10-CM | POA: Diagnosis not present

## 2012-07-25 DIAGNOSIS — Z23 Encounter for immunization: Secondary | ICD-10-CM | POA: Diagnosis not present

## 2012-07-26 ENCOUNTER — Emergency Department (HOSPITAL_COMMUNITY): Payer: Medicare Other

## 2012-07-26 ENCOUNTER — Emergency Department (HOSPITAL_COMMUNITY)
Admission: EM | Admit: 2012-07-26 | Discharge: 2012-07-26 | Disposition: A | Discharge status: Home / Self Care | Payer: Medicare Other | Attending: Emergency Medicine | Admitting: Emergency Medicine

## 2012-07-26 ENCOUNTER — Encounter (HOSPITAL_COMMUNITY): Payer: Self-pay | Admitting: Family Medicine

## 2012-07-26 DIAGNOSIS — R29810 Facial weakness: Secondary | ICD-10-CM | POA: Diagnosis not present

## 2012-07-26 DIAGNOSIS — R51 Headache: Secondary | ICD-10-CM | POA: Diagnosis not present

## 2012-07-26 DIAGNOSIS — G319 Degenerative disease of nervous system, unspecified: Secondary | ICD-10-CM | POA: Insufficient documentation

## 2012-07-26 DIAGNOSIS — E119 Type 2 diabetes mellitus without complications: Secondary | ICD-10-CM | POA: Diagnosis not present

## 2012-07-26 DIAGNOSIS — I10 Essential (primary) hypertension: Secondary | ICD-10-CM | POA: Diagnosis not present

## 2012-07-26 DIAGNOSIS — R209 Unspecified disturbances of skin sensation: Secondary | ICD-10-CM | POA: Diagnosis not present

## 2012-07-26 DIAGNOSIS — Z9089 Acquired absence of other organs: Secondary | ICD-10-CM | POA: Diagnosis not present

## 2012-07-26 DIAGNOSIS — R200 Anesthesia of skin: Secondary | ICD-10-CM

## 2012-07-26 LAB — COMPREHENSIVE METABOLIC PANEL
ALT: 29 U/L (ref 0–35)
AST: 21 U/L (ref 0–37)
Albumin: 4.5 g/dL (ref 3.5–5.2)
Alkaline Phosphatase: 82 U/L (ref 39–117)
BUN: 13 mg/dL (ref 6–23)
CO2: 27 mEq/L (ref 19–32)
Calcium: 10.3 mg/dL (ref 8.4–10.5)
Chloride: 97 mEq/L (ref 96–112)
Creatinine, Ser: 0.84 mg/dL (ref 0.50–1.10)
GFR calc Af Amer: 82 mL/min — ABNORMAL LOW (ref >90)
GFR calc non Af Amer: 71 mL/min — ABNORMAL LOW (ref >90)
Glucose, Bld: 219 mg/dL — ABNORMAL HIGH (ref 70–99)
Potassium: 3.8 mEq/L (ref 3.5–5.1)
Sodium: 136 mEq/L (ref 135–145)
Total Bilirubin: 0.4 mg/dL (ref 0.3–1.2)
Total Protein: 8.2 g/dL (ref 6.0–8.3)

## 2012-07-26 LAB — CBC WITH DIFFERENTIAL/PLATELET
Basophils Absolute: 0.1 10*3/uL (ref 0.0–0.1)
Basophils Relative: 0 % (ref 0–1)
Eosinophils Absolute: 0.4 10*3/uL (ref 0.0–0.7)
Eosinophils Relative: 3 % (ref 0–5)
HCT: 40.6 % (ref 36.0–46.0)
Hemoglobin: 14.4 g/dL (ref 12.0–15.0)
Lymphocytes Relative: 27 % (ref 12–46)
Lymphs Abs: 3.2 10*3/uL (ref 0.7–4.0)
MCH: 30.2 pg (ref 26.0–34.0)
MCHC: 35.5 g/dL (ref 30.0–36.0)
MCV: 85.1 fL (ref 78.0–100.0)
Monocytes Absolute: 0.8 10*3/uL (ref 0.1–1.0)
Monocytes Relative: 7 % (ref 3–12)
Neutro Abs: 7.6 10*3/uL (ref 1.7–7.7)
Neutrophils Relative %: 63 % (ref 43–77)
Platelets: 302 10*3/uL (ref 150–400)
RBC: 4.77 MIL/uL (ref 3.87–5.11)
RDW: 12.6 % (ref 11.5–15.5)
WBC: 12.1 10*3/uL — ABNORMAL HIGH (ref 4.0–10.5)

## 2012-07-26 LAB — GLUCOSE, CAPILLARY: Glucose-Capillary: 205 mg/dL — ABNORMAL HIGH (ref 70–99)

## 2012-07-26 NOTE — ED Provider Notes (Signed)
History     CSN: [id number]  Arrival date & time 07/26/12  1237   First MD Initiated Contact with Patient 07/26/12 1948      Chief Complaint  Patient presents with  . Cerebrovascular Accident     HPI Patient awoke this morning with some tongue numbness.  She said it lasted for short period time and went away.` Patient denies arm or leg weakness.  Denies dysarthria or speech problems.  Denies headache. Past Medical History  Diagnosis Date  . Hypertension   . Hyperlipidemia   . Diabetes mellitus   . Dermatitis   . Diverticulitis     recurrent  . Adenomatous colon polyp   . Obstructive sleep apnea on CPAP     pt does not use  . Heart palpitations   . PVC's (premature ventricular contractions)   . Esophageal stricture   . Hemorrhoids   . Colonic polyp   . Bigeminy   . Chest pain   . DVT (deep venous thrombosis)   . Abnormal liver function   . GERD (gastroesophageal reflux disease)     Past Surgical History  Procedure Date  . Cholecystectomy     aprox 10 years ago  . Abdominal hysterectomy   . Knee surgery     Right  . Knee surgery     Left    Family History  Problem Relation Age of Onset  . Heart attack Mother 34  . Heart attack Brother 61  . Heart attack Maternal Grandmother 67  . Heart attack Maternal Grandfather 63  . Cancer Daughter     Ovarian Cancer  . Diabetes Other     Grandparent  . Heart disease Other     Grandparent    History  Substance Use Topics  . Smoking status: Never Smoker   . Smokeless tobacco: Not on file  . Alcohol Use: No    OB History    Grav Para Term Preterm Abortions TAB SAB Ect Mult Living                  Review of Systems  All other systems reviewed and are negative.    Allergies  Adhesive; Benicar; Beta adrenergic blockers; Cartia xt; Cephalexin; Codeine; Colesevelam; Crestor; Flagyl; Fluvastatin sodium; Levemir; Lisinopril; Metoprolol succinate; Nortriptyline hcl; Pamelor; Rosuvastatin; Sulfamethoxazole  w-trimethoprim; Tradjenta; Verapamil; Welchol; and Trovan  Home Medications   Current Outpatient Rx  Name Route Sig Dispense Refill  . ACETAMINOPHEN 500 MG PO TABS Oral Take 1,000 mg by mouth daily as needed. For pain or headache    . ALPRAZOLAM 0.25 MG PO TABS Oral Take 0.25 mg by mouth 3 (three) times daily as needed. For anxiety    . ALUM & MAG HYDROXIDE-SIMETH 200-200-20 MG/5ML PO SUSP Oral Take 30-60 mLs by mouth every 6 (six) hours as needed. For indigestion    . ASPIRIN 325 MG PO TABS Oral Take 325 mg by mouth daily.    Marland Kitchen VITAMIN B-12 PO Oral Take 1 tablet by mouth daily.    Marland Kitchen DILTIAZEM HCL ER COATED BEADS 240 MG PO CP24 Oral Take 240 mg by mouth daily.    Marland Kitchen ESTROGENS CONJUGATED 0.625 MG PO TABS Oral Take 0.625 mg by mouth daily.     Marland Kitchen FLUTICASONE PROPIONATE 50 MCG/ACT NA SUSP Nasal Place 2 sprays into the nose daily as needed. For allergies    . FLUVASTATIN SODIUM 20 MG PO CAPS Oral Take 20 mg by mouth at bedtime.    Marland Kitchen GLIPIZIDE  ER 2.5 MG PO TB24 Oral Take 2.5 mg by mouth daily.    Marland Kitchen HYDROCHLOROTHIAZIDE 25 MG PO TABS Oral Take 25 mg by mouth daily.    Marland Kitchen METFORMIN HCL 1000 MG PO TABS Oral Take 1,000 mg by mouth 2 (two) times daily with a meal.    . NADOLOL 20 MG PO TABS Oral Take 10 mg by mouth daily.     Marland Kitchen RAMIPRIL 10 MG PO CAPS Oral Take 10 mg by mouth 2 (two) times daily.     . SUCRALFATE 1 G PO TABS Oral Take 1 g by mouth daily as needed. For indigestion    . TRIAMCINOLONE ACETONIDE 0.1 % EX CREA Topical Apply 1 application topically daily as needed. For rash      BP 131/62  Pulse 69  Temp 98.5 F (36.9 C) (Oral)  Resp 18  SpO2 98%  Physical Exam  Nursing note and vitals reviewed. Constitutional: She is oriented to person, place, and time. She appears well-developed and well-nourished. No distress.  HENT:  Head: Normocephalic and atraumatic.  Eyes: Pupils are equal, round, and reactive to light.  Neck: Normal range of motion. Carotid bruit is not present.    Cardiovascular: Normal rate and intact distal pulses.   Pulmonary/Chest: No respiratory distress.  Abdominal: Normal appearance. She exhibits no distension.  Musculoskeletal: Normal range of motion.  Neurological: She is alert and oriented to person, place, and time. She has normal strength. No cranial nerve deficit or sensory deficit. Coordination normal. GCS eye subscore is 4. GCS verbal subscore is 5. GCS motor subscore is 6.  Skin: Skin is warm and dry. No rash noted.  Psychiatric: She has a normal mood and affect. Her behavior is normal.    ED Course  Procedures (including critical care time)  EKG: Normal sinus rhythm Left axis deviation Low voltage QRS Cannot rule out Inferior infarct , age undetermined Cannot rule out Anterior infarct , age undetermined Abnormal ECG Premature ventricular complexes seen onn last tracing have resolved Labs Reviewed  CBC WITH DIFFERENTIAL - Abnormal; Notable for the following:    WBC 12.1 (*)     All other components within normal limits  COMPREHENSIVE METABOLIC PANEL - Abnormal; Notable for the following:    Glucose, Bld 219 (*)     GFR calc non Af Amer 71 (*)     GFR calc Af Amer 82 (*)     All other components within normal limits  GLUCOSE, CAPILLARY - Abnormal; Notable for the following:    Glucose-Capillary 205 (*)     All other components within normal limits   Ct Head Wo Contrast  07/26/2012  *RADIOLOGY REPORT*  Clinical Data: Right-sided facial weakness.  Pain.  CT HEAD WITHOUT CONTRAST  Technique:  Contiguous axial images were obtained from the base of the skull through the vertex without contrast.  Comparison: 01/28/2012  Findings: Mild cerebral atrophy.  No ventricular patchy areas of low attenuation in the deep white matter consistent with small vessel ischemia old lacunar infarcts in the deep white matter.  No mass effect or midline shift.  No abnormal extra-axial fluid collections.  Gray-white matter junctions are distinct.   Basal cisterns are not effaced.  No evidence of acute intracranial hemorrhage no depressed skull fractures.  Visualized paranasal sinuses and mastoid air cells are not opacified.  Vascular calcifications.  IMPRESSION: No acute intracranial abnormalities.  Mild chronic atrophy and small vessel ischemic changes.   Original Report Authenticated By: Marlon Pel, M.D.  1. Numbness of tongue       MDM  Patient Artie takes aspirin daily.        Nelia Shi, MD 07/26/12 [phone]

## 2012-07-26 NOTE — ED Notes (Signed)
EMD at bedside.

## 2012-07-26 NOTE — ED Notes (Signed)
Per pt sts woke up this am but didn't notice until about 9 am that she had right sided facial numbness and slight facial droop. sts it feel funny when she drinks anything. Slight facial droop noted

## 2012-07-28 DIAGNOSIS — K5732 Diverticulitis of large intestine without perforation or abscess without bleeding: Secondary | ICD-10-CM | POA: Diagnosis not present

## 2012-07-28 DIAGNOSIS — R209 Unspecified disturbances of skin sensation: Secondary | ICD-10-CM | POA: Diagnosis not present

## 2012-07-31 ENCOUNTER — Other Ambulatory Visit: Payer: Self-pay | Admitting: Family Medicine

## 2012-07-31 DIAGNOSIS — R200 Anesthesia of skin: Secondary | ICD-10-CM

## 2012-08-02 ENCOUNTER — Ambulatory Visit
Admission: RE | Admit: 2012-08-02 | Discharge: 2012-08-02 | Disposition: A | Discharge status: Home / Self Care | Payer: Medicare Other | Source: Ambulatory Visit | Attending: Family Medicine | Admitting: Family Medicine

## 2012-08-02 DIAGNOSIS — R200 Anesthesia of skin: Secondary | ICD-10-CM

## 2012-08-02 DIAGNOSIS — I658 Occlusion and stenosis of other precerebral arteries: Secondary | ICD-10-CM | POA: Diagnosis not present

## 2012-08-07 DIAGNOSIS — E663 Overweight: Secondary | ICD-10-CM | POA: Diagnosis not present

## 2012-08-07 DIAGNOSIS — IMO0001 Reserved for inherently not codable concepts without codable children: Secondary | ICD-10-CM | POA: Diagnosis not present

## 2012-08-08 DIAGNOSIS — E119 Type 2 diabetes mellitus without complications: Secondary | ICD-10-CM | POA: Diagnosis not present

## 2012-08-08 DIAGNOSIS — H15009 Unspecified scleritis, unspecified eye: Secondary | ICD-10-CM | POA: Diagnosis not present

## 2012-08-08 DIAGNOSIS — H35359 Cystoid macular degeneration, unspecified eye: Secondary | ICD-10-CM | POA: Diagnosis not present

## 2012-08-08 DIAGNOSIS — H2010 Chronic iridocyclitis, unspecified eye: Secondary | ICD-10-CM | POA: Diagnosis not present

## 2012-08-22 DIAGNOSIS — IMO0001 Reserved for inherently not codable concepts without codable children: Secondary | ICD-10-CM | POA: Diagnosis not present

## 2012-08-23 DIAGNOSIS — N949 Unspecified condition associated with female genital organs and menstrual cycle: Secondary | ICD-10-CM | POA: Diagnosis not present

## 2012-08-23 DIAGNOSIS — R1032 Left lower quadrant pain: Secondary | ICD-10-CM | POA: Diagnosis not present

## 2012-08-23 DIAGNOSIS — R29898 Other symptoms and signs involving the musculoskeletal system: Secondary | ICD-10-CM | POA: Diagnosis not present

## 2012-08-24 DIAGNOSIS — IMO0001 Reserved for inherently not codable concepts without codable children: Secondary | ICD-10-CM | POA: Diagnosis not present

## 2012-08-28 ENCOUNTER — Other Ambulatory Visit: Payer: Self-pay | Admitting: Gastroenterology

## 2012-08-28 DIAGNOSIS — R1032 Left lower quadrant pain: Secondary | ICD-10-CM | POA: Diagnosis not present

## 2012-08-31 ENCOUNTER — Ambulatory Visit
Admission: RE | Admit: 2012-08-31 | Discharge: 2012-08-31 | Disposition: A | Discharge status: Home / Self Care | Payer: Medicare Other | Source: Ambulatory Visit | Attending: Gastroenterology | Admitting: Gastroenterology

## 2012-08-31 DIAGNOSIS — K5710 Diverticulosis of small intestine without perforation or abscess without bleeding: Secondary | ICD-10-CM | POA: Diagnosis not present

## 2012-08-31 DIAGNOSIS — R1032 Left lower quadrant pain: Secondary | ICD-10-CM

## 2012-08-31 MED ORDER — IOHEXOL 300 MG/ML  SOLN
100.0000 mL | Freq: Once | INTRAMUSCULAR | Status: AC | PRN
  Administered 2012-08-31: 100 mL via INTRAVENOUS

## 2012-09-14 DIAGNOSIS — R1032 Left lower quadrant pain: Secondary | ICD-10-CM | POA: Diagnosis not present

## 2012-10-06 DIAGNOSIS — S62609A Fracture of unspecified phalanx of unspecified finger, initial encounter for closed fracture: Secondary | ICD-10-CM | POA: Diagnosis not present

## 2012-10-10 DIAGNOSIS — E119 Type 2 diabetes mellitus without complications: Secondary | ICD-10-CM | POA: Diagnosis not present

## 2012-10-10 DIAGNOSIS — H15009 Unspecified scleritis, unspecified eye: Secondary | ICD-10-CM | POA: Diagnosis not present

## 2012-10-10 DIAGNOSIS — H35359 Cystoid macular degeneration, unspecified eye: Secondary | ICD-10-CM | POA: Diagnosis not present

## 2012-10-10 DIAGNOSIS — H2010 Chronic iridocyclitis, unspecified eye: Secondary | ICD-10-CM | POA: Diagnosis not present

## 2012-10-17 DIAGNOSIS — S62639A Displaced fracture of distal phalanx of unspecified finger, initial encounter for closed fracture: Secondary | ICD-10-CM | POA: Diagnosis not present

## 2012-10-18 DIAGNOSIS — Z79899 Other long term (current) drug therapy: Secondary | ICD-10-CM | POA: Diagnosis not present

## 2012-10-18 DIAGNOSIS — I119 Hypertensive heart disease without heart failure: Secondary | ICD-10-CM | POA: Diagnosis not present

## 2012-10-18 DIAGNOSIS — R002 Palpitations: Secondary | ICD-10-CM | POA: Diagnosis not present

## 2012-10-18 DIAGNOSIS — I4949 Other premature depolarization: Secondary | ICD-10-CM | POA: Diagnosis not present

## 2012-10-19 ENCOUNTER — Ambulatory Visit: Payer: Medicare Other | Admitting: Endocrinology

## 2012-10-20 DIAGNOSIS — S62639A Displaced fracture of distal phalanx of unspecified finger, initial encounter for closed fracture: Secondary | ICD-10-CM | POA: Diagnosis not present

## 2012-10-21 DIAGNOSIS — M545 Low back pain, unspecified: Secondary | ICD-10-CM | POA: Diagnosis not present

## 2012-10-24 DIAGNOSIS — M545 Low back pain, unspecified: Secondary | ICD-10-CM | POA: Diagnosis not present

## 2012-10-24 DIAGNOSIS — R1904 Left lower quadrant abdominal swelling, mass and lump: Secondary | ICD-10-CM | POA: Diagnosis not present

## 2012-10-24 DIAGNOSIS — IMO0001 Reserved for inherently not codable concepts without codable children: Secondary | ICD-10-CM | POA: Diagnosis not present

## 2012-10-24 DIAGNOSIS — E669 Obesity, unspecified: Secondary | ICD-10-CM | POA: Diagnosis not present

## 2012-10-25 DIAGNOSIS — Z79899 Other long term (current) drug therapy: Secondary | ICD-10-CM | POA: Diagnosis not present

## 2012-10-25 DIAGNOSIS — I4949 Other premature depolarization: Secondary | ICD-10-CM | POA: Diagnosis not present

## 2012-10-25 DIAGNOSIS — R002 Palpitations: Secondary | ICD-10-CM | POA: Diagnosis not present

## 2012-10-25 DIAGNOSIS — I119 Hypertensive heart disease without heart failure: Secondary | ICD-10-CM | POA: Diagnosis not present

## 2012-10-26 DIAGNOSIS — S62639A Displaced fracture of distal phalanx of unspecified finger, initial encounter for closed fracture: Secondary | ICD-10-CM | POA: Diagnosis not present

## 2012-10-31 ENCOUNTER — Ambulatory Visit: Payer: Medicare Other | Admitting: Endocrinology

## 2012-11-02 DIAGNOSIS — S62639A Displaced fracture of distal phalanx of unspecified finger, initial encounter for closed fracture: Secondary | ICD-10-CM | POA: Diagnosis not present

## 2012-11-07 DIAGNOSIS — S6980XA Other specified injuries of unspecified wrist, hand and finger(s), initial encounter: Secondary | ICD-10-CM | POA: Diagnosis not present

## 2012-11-07 DIAGNOSIS — S6990XA Unspecified injury of unspecified wrist, hand and finger(s), initial encounter: Secondary | ICD-10-CM | POA: Diagnosis not present

## 2012-11-07 DIAGNOSIS — S62639A Displaced fracture of distal phalanx of unspecified finger, initial encounter for closed fracture: Secondary | ICD-10-CM | POA: Diagnosis not present

## 2012-11-10 DIAGNOSIS — S62639A Displaced fracture of distal phalanx of unspecified finger, initial encounter for closed fracture: Secondary | ICD-10-CM | POA: Diagnosis not present

## 2012-11-30 DIAGNOSIS — S6980XA Other specified injuries of unspecified wrist, hand and finger(s), initial encounter: Secondary | ICD-10-CM | POA: Diagnosis not present

## 2012-11-30 DIAGNOSIS — S62639A Displaced fracture of distal phalanx of unspecified finger, initial encounter for closed fracture: Secondary | ICD-10-CM | POA: Diagnosis not present

## 2012-11-30 DIAGNOSIS — I4949 Other premature depolarization: Secondary | ICD-10-CM | POA: Diagnosis not present

## 2012-11-30 DIAGNOSIS — R002 Palpitations: Secondary | ICD-10-CM | POA: Diagnosis not present

## 2012-11-30 DIAGNOSIS — I119 Hypertensive heart disease without heart failure: Secondary | ICD-10-CM | POA: Diagnosis not present

## 2012-11-30 DIAGNOSIS — S6990XA Unspecified injury of unspecified wrist, hand and finger(s), initial encounter: Secondary | ICD-10-CM | POA: Diagnosis not present

## 2012-12-08 DIAGNOSIS — IMO0001 Reserved for inherently not codable concepts without codable children: Secondary | ICD-10-CM | POA: Diagnosis not present

## 2012-12-08 DIAGNOSIS — R52 Pain, unspecified: Secondary | ICD-10-CM | POA: Diagnosis not present

## 2012-12-08 DIAGNOSIS — R5381 Other malaise: Secondary | ICD-10-CM | POA: Diagnosis not present

## 2012-12-12 DIAGNOSIS — E119 Type 2 diabetes mellitus without complications: Secondary | ICD-10-CM | POA: Diagnosis not present

## 2012-12-13 ENCOUNTER — Emergency Department (HOSPITAL_COMMUNITY): Payer: Medicare Other

## 2012-12-13 ENCOUNTER — Encounter (HOSPITAL_COMMUNITY): Payer: Self-pay | Admitting: Emergency Medicine

## 2012-12-13 ENCOUNTER — Observation Stay (HOSPITAL_COMMUNITY)
Admission: EM | Admit: 2012-12-13 | Discharge: 2012-12-14 | Disposition: A | Discharge status: Home / Self Care | Payer: Medicare Other | Attending: Family Medicine | Admitting: Family Medicine

## 2012-12-13 DIAGNOSIS — R0609 Other forms of dyspnea: Secondary | ICD-10-CM | POA: Diagnosis present

## 2012-12-13 DIAGNOSIS — R0600 Dyspnea, unspecified: Secondary | ICD-10-CM | POA: Diagnosis present

## 2012-12-13 DIAGNOSIS — R079 Chest pain, unspecified: Secondary | ICD-10-CM | POA: Diagnosis not present

## 2012-12-13 DIAGNOSIS — E119 Type 2 diabetes mellitus without complications: Secondary | ICD-10-CM | POA: Diagnosis not present

## 2012-12-13 DIAGNOSIS — E782 Mixed hyperlipidemia: Secondary | ICD-10-CM | POA: Diagnosis not present

## 2012-12-13 DIAGNOSIS — K648 Other hemorrhoids: Secondary | ICD-10-CM | POA: Diagnosis not present

## 2012-12-13 DIAGNOSIS — I498 Other specified cardiac arrhythmias: Secondary | ICD-10-CM | POA: Diagnosis not present

## 2012-12-13 DIAGNOSIS — E785 Hyperlipidemia, unspecified: Secondary | ICD-10-CM | POA: Diagnosis not present

## 2012-12-13 DIAGNOSIS — R110 Nausea: Secondary | ICD-10-CM | POA: Diagnosis not present

## 2012-12-13 DIAGNOSIS — R0789 Other chest pain: Secondary | ICD-10-CM | POA: Diagnosis not present

## 2012-12-13 DIAGNOSIS — I10 Essential (primary) hypertension: Secondary | ICD-10-CM | POA: Diagnosis present

## 2012-12-13 DIAGNOSIS — R059 Cough, unspecified: Secondary | ICD-10-CM | POA: Diagnosis not present

## 2012-12-13 DIAGNOSIS — R0602 Shortness of breath: Secondary | ICD-10-CM | POA: Diagnosis not present

## 2012-12-13 LAB — BASIC METABOLIC PANEL
BUN: 12 mg/dL (ref 6–23)
CO2: 24 mEq/L (ref 19–32)
Calcium: 10.1 mg/dL (ref 8.4–10.5)
Chloride: 96 mEq/L (ref 96–112)
Creatinine, Ser: 0.95 mg/dL (ref 0.50–1.10)
GFR calc Af Amer: 71 mL/min — ABNORMAL LOW (ref >90)
GFR calc non Af Amer: 61 mL/min — ABNORMAL LOW (ref >90)
Glucose, Bld: 223 mg/dL — ABNORMAL HIGH (ref 70–99)
Potassium: 3.6 mEq/L (ref 3.5–5.1)
Sodium: 136 mEq/L (ref 135–145)

## 2012-12-13 LAB — CBC WITH DIFFERENTIAL/PLATELET
Basophils Absolute: 0 10*3/uL (ref 0.0–0.1)
Basophils Relative: 1 % (ref 0–1)
Eosinophils Absolute: 0.1 10*3/uL (ref 0.0–0.7)
Eosinophils Relative: 1 % (ref 0–5)
HCT: 35.3 % — ABNORMAL LOW (ref 36.0–46.0)
Hemoglobin: 13 g/dL (ref 12.0–15.0)
Lymphocytes Relative: 29 % (ref 12–46)
Lymphs Abs: 2.3 10*3/uL (ref 0.7–4.0)
MCH: 30.7 pg (ref 26.0–34.0)
MCHC: 36.8 g/dL — ABNORMAL HIGH (ref 30.0–36.0)
MCV: 83.5 fL (ref 78.0–100.0)
Monocytes Absolute: 0.6 10*3/uL (ref 0.1–1.0)
Monocytes Relative: 7 % (ref 3–12)
Neutro Abs: 5 10*3/uL (ref 1.7–7.7)
Neutrophils Relative %: 63 % (ref 43–77)
Platelets: 262 10*3/uL (ref 150–400)
RBC: 4.23 MIL/uL (ref 3.87–5.11)
RDW: 11.6 % (ref 11.5–15.5)
WBC: 7.9 10*3/uL (ref 4.0–10.5)

## 2012-12-13 LAB — CBC
HCT: 39 % (ref 36.0–46.0)
Hemoglobin: 14 g/dL (ref 12.0–15.0)
MCH: 30.2 pg (ref 26.0–34.0)
MCHC: 35.9 g/dL (ref 30.0–36.0)
MCV: 84.1 fL (ref 78.0–100.0)
Platelets: 292 10*3/uL (ref 150–400)
RBC: 4.64 MIL/uL (ref 3.87–5.11)
RDW: 11.6 % (ref 11.5–15.5)
WBC: 9.6 10*3/uL (ref 4.0–10.5)

## 2012-12-13 LAB — POCT I-STAT TROPONIN I: Troponin i, poc: 0 ng/mL (ref 0.00–0.08)

## 2012-12-13 LAB — TROPONIN I: Troponin I: <0.3 ng/mL (ref <0.30)

## 2012-12-13 LAB — GLUCOSE, CAPILLARY
Glucose-Capillary: 187 mg/dL — ABNORMAL HIGH (ref 70–99)
Glucose-Capillary: 185 mg/dL — ABNORMAL HIGH (ref 70–99)

## 2012-12-13 LAB — CREATININE, SERUM
Creatinine, Ser: 1 mg/dL (ref 0.50–1.10)
GFR calc Af Amer: 67 mL/min — ABNORMAL LOW (ref >90)
GFR calc non Af Amer: 57 mL/min — ABNORMAL LOW (ref >90)

## 2012-12-13 LAB — D-DIMER, QUANTITATIVE: D-Dimer, Quant: <0.27 ug/mL-FEU (ref 0.00–0.48)

## 2012-12-13 MED ORDER — INSULIN GLARGINE 100 UNIT/ML ~~LOC~~ SOLN: 30.0000 [IU] | Freq: Every day | SUBCUTANEOUS | Status: DC

## 2012-12-13 MED ORDER — METFORMIN HCL 500 MG PO TABS
1000.0000 mg | ORAL_TABLET | Freq: Two times a day (BID) | ORAL | Status: DC
  Administered 2012-12-14: 1000 mg via ORAL
  Filled 2012-12-13 (×3): qty 2

## 2012-12-13 MED ORDER — HEPARIN SODIUM (PORCINE) 5000 UNIT/ML IJ SOLN
5000.0000 [IU] | Freq: Three times a day (TID) | INTRAMUSCULAR | Status: DC
  Administered 2012-12-13 – 2012-12-14 (×2): 5000 [IU] via SUBCUTANEOUS
  Filled 2012-12-13 (×5): qty 1

## 2012-12-13 MED ORDER — HYDROCHLOROTHIAZIDE 25 MG PO TABS
25.0000 mg | ORAL_TABLET | Freq: Every day | ORAL | Status: DC
  Filled 2012-12-13 (×2): qty 1

## 2012-12-13 MED ORDER — ASPIRIN 325 MG PO TABS
325.0000 mg | ORAL_TABLET | Freq: Every day | ORAL | Status: DC
  Filled 2012-12-13 (×2): qty 1

## 2012-12-13 MED ORDER — NADOLOL 20 MG PO TABS
10.0000 mg | ORAL_TABLET | Freq: Every day | ORAL | Status: DC
  Filled 2012-12-13 (×2): qty 1

## 2012-12-13 MED ORDER — RAMIPRIL 10 MG PO CAPS
10.0000 mg | ORAL_CAPSULE | Freq: Two times a day (BID) | ORAL | Status: DC
  Administered 2012-12-13: 10 mg via ORAL
  Filled 2012-12-13 (×3): qty 1

## 2012-12-13 MED ORDER — DILTIAZEM HCL ER COATED BEADS 240 MG PO CP24
240.0000 mg | ORAL_CAPSULE | Freq: Every day | ORAL | Status: DC
  Filled 2012-12-13 (×2): qty 1

## 2012-12-13 MED ORDER — ACETAMINOPHEN 500 MG PO TABS
1000.0000 mg | ORAL_TABLET | Freq: Every day | ORAL | Status: DC | PRN
  Filled 2012-12-13: qty 2

## 2012-12-13 MED ORDER — SODIUM CHLORIDE 0.9 % IJ SOLN
3.0000 mL | Freq: Two times a day (BID) | INTRAMUSCULAR | Status: DC
  Administered 2012-12-13: 3 mL via INTRAVENOUS

## 2012-12-13 NOTE — ED Provider Notes (Signed)
History     CSN: [id number]  Arrival date & time 12/13/12  1217   First MD Initiated Contact with Patient 12/13/12 1303      Chief Complaint  Patient presents with  . Chest Pain  . Shortness of Breath  . Nausea    (Consider location/radiation/quality/duration/timing/severity/associated sxs/prior treatment) Patient is a 67 y.o. female presenting with chest pain and shortness of breath.  Chest Pain Associated symptoms: shortness of breath   Shortness of Breath Associated symptoms: chest pain    Pt reports she began having heart racing, SOB and chest tightness around 11am today. She sat down to relax and symptoms improved over time. She is pain free now. Reports she has had cold symptoms recently including cough, myalgias, and nausea. She states she has had tachycardia and PVC before. No known CAD or MI.   Past Medical History  Diagnosis Date  . Hypertension   . Hyperlipidemia   . Diabetes mellitus   . Dermatitis   . Diverticulitis     recurrent  . Adenomatous colon polyp   . Obstructive sleep apnea on CPAP     pt does not use  . Heart palpitations   . PVC's (premature ventricular contractions)   . Esophageal stricture   . Hemorrhoids   . Colonic polyp   . Bigeminy   . Chest pain   . DVT (deep venous thrombosis)   . Abnormal liver function   . GERD (gastroesophageal reflux disease)     Past Surgical History  Procedure Laterality Date  . Cholecystectomy      aprox 10 years ago  . Abdominal hysterectomy    . Knee surgery      Right  . Knee surgery      Left    Family History  Problem Relation Age of Onset  . Heart attack Mother 16  . Heart attack Brother 64  . Heart attack Maternal Grandmother 67  . Heart attack Maternal Grandfather 63  . Cancer Daughter     Ovarian Cancer  . Diabetes Other     Grandparent  . Heart disease Other     Grandparent    History  Substance Use Topics  . Smoking status: Never Smoker   . Smokeless tobacco: Not on file  .  Alcohol Use: No    OB History   Grav Para Term Preterm Abortions TAB SAB Ect Mult Living                  Review of Systems  Respiratory: Positive for shortness of breath.   Cardiovascular: Positive for chest pain.   All other systems reviewed and are negative except as noted in HPI.   Allergies  Adhesive; Benicar; Beta adrenergic blockers; Cartia xt; Cephalexin; Codeine; Colesevelam; Crestor; Flagyl; Fluvastatin sodium; Levemir; Lisinopril; Metoprolol succinate; Nortriptyline hcl; Pamelor; Rosuvastatin; Sulfamethoxazole w-trimethoprim; Tradjenta; Verapamil; Welchol; and Trovan  Home Medications   Current Outpatient Rx  Name  Route  Sig  Dispense  Refill  . acetaminophen (TYLENOL) 500 MG tablet   Oral   Take 1,000 mg by mouth daily as needed. For pain or headache         . aspirin 325 MG tablet   Oral   Take 325 mg by mouth daily.         . Cyanocobalamin (VITAMIN B-12 PO)   Oral   Take 1 tablet by mouth daily.         Marland Kitchen diltiazem (CARDIZEM CD) 240 MG 24 hr capsule  Oral   Take 240 mg by mouth daily.         . hydrochlorothiazide (HYDRODIURIL) 25 MG tablet   Oral   Take 25 mg by mouth daily.         . insulin glargine (LANTUS) 100 UNIT/ML injection   Subcutaneous   Inject 30 Units into the skin at bedtime.         . metFORMIN (GLUCOPHAGE) 1000 MG tablet   Oral   Take 1,000 mg by mouth 2 (two) times daily with a meal.         . nadolol (CORGARD) 20 MG tablet   Oral   Take 10 mg by mouth daily.          . ramipril (ALTACE) 10 MG capsule   Oral   Take 10 mg by mouth 2 (two) times daily.            BP 148/62  Temp(Src) 97.8 F (36.6 C) (Oral)  Resp 20  SpO2 95%  Physical Exam  Nursing note and vitals reviewed. Constitutional: She is oriented to person, place, and time. She appears well-developed and well-nourished.  HENT:  Head: Normocephalic and atraumatic.  Eyes: EOM are normal. Pupils are equal, round, and reactive to light.   Neck: Normal range of motion. Neck supple.  Cardiovascular: Normal rate, normal heart sounds and intact distal pulses.   Pulmonary/Chest: Effort normal and breath sounds normal.  Abdominal: Bowel sounds are normal. She exhibits no distension. There is no tenderness.  Musculoskeletal: Normal range of motion. She exhibits no edema and no tenderness.  Neurological: She is alert and oriented to person, place, and time. She has normal strength. No cranial nerve deficit or sensory deficit.  Skin: Skin is warm and dry. No rash noted.  Psychiatric: She has a normal mood and affect.    ED Course  Procedures (including critical care time)  Labs Reviewed  CBC WITH DIFFERENTIAL - Abnormal; Notable for the following:    HCT 35.3 (*)    MCHC 36.8 (*)    All other components within normal limits  BASIC METABOLIC PANEL - Abnormal; Notable for the following:    Glucose, Bld 223 (*)    GFR calc non Af Amer 61 (*)    GFR calc Af Amer 71 (*)    All other components within normal limits  D-DIMER, QUANTITATIVE  POCT I-STAT TROPONIN I   Dg Chest 2 View  12/13/2012  *RADIOLOGY REPORT*  Clinical Data: Cough.  Chest pain.  History of hypertension.  CHEST - 2 VIEW  Comparison: 03/21/2012  Findings: Heart size is normal.  Mediastinal shadows are normal. There is mild scarring at the lung bases, unchanged since the previous study.  No evidence of infiltrate, collapse or effusion. No acute bony finding.  IMPRESSION: Chronic scarring at the lung bases.  No active disease.   Original Report Authenticated By: Paulina Fusi, M.D.      1. Chest pain       MDM   Date: 12/13/2012  Rate: 96  Rhythm: normal sinus rhythm  QRS Axis: normal  Intervals: normal  ST/T Wave abnormalities: nonspecific T wave changes  Conduction Disutrbances:none  Narrative Interpretation:   Old EKG Reviewed: unchanged  Labs and imaging unremarkable. Pt remains pain free. Discussed admission with Dr. Mahala Menghini with Hospitalist. Will add  dimer due to history of DVT.         Minetta Krisher B. Bernette Mayers, MD 12/13/12 1659

## 2012-12-13 NOTE — ED Notes (Signed)
Pt transported to xray 

## 2012-12-13 NOTE — ED Notes (Signed)
EMS reported fire department report heart rate 130 EMS arrived patient heart rate 108 then 78.

## 2012-12-13 NOTE — ED Notes (Signed)
Pt was given a [place] sandwich and a diet coke

## 2012-12-13 NOTE — ED Notes (Signed)
Onset today nausea then chest pain and shortness of breath.  EMS called patient took own aspirin 325 mg PO and EMS gave one nitro SL. Pain improved to 1-2/10 achy chest pain.

## 2012-12-13 NOTE — H&P (Signed)
[facility] History and Physical  Alice Cole WUJ:[id number] DOB: 01-20-1946 DOA: 12/13/2012  Referring physician: Bernette Mayers, EDP PCP: Elie Confer, MD  Specialists:  None currently  Chief Complaint: Chest pain  HPI: Alice Cole is a 67 y.o. female who presented tot he [facility] after having some CP.  THis was when she went to the [employer] and went to the bathroom there and she found that her HR went up to 120's and flet like her chest was really tight and her chest felt "tight"   She states that this has happened befroe but never as high as it has today [was 200's systolic] SHe just got finished wearing a heart monitor with dr. Mendel Corning which showed only PVC's  She states she has been feelign bad for about 2-3 weeks and was actually not feeling well for teh past couple of weeks She has had vague non-specific findings with Leg pains, arm pain and fatigue She is compliant on her nadolol The pain in the chest was more of a tightness.  It did not radiate but she has had some radiating arm pain over the past couple of weeks She states she was resltess last night with the similar vague complaitns-she thinks sh has  A lot of stress on her   Review of Systems: The patient denies fever, chills, cough is short and nagging, occasional n/v. No dysuria, no rash, no HA, no fever, no dark or tarry stool, no blurred or double vision, no falls States with her PVC's she occ has dizzyness   Past Medical History  Diagnosis Date  . Hypertension   . Hyperlipidemia   . Diabetes mellitus   . Dermatitis   . Diverticulitis     recurrent  . Adenomatous colon polyp   . Obstructive sleep apnea on CPAP     pt does not use  . Heart palpitations   . PVC's (premature ventricular contractions)   . Esophageal stricture   . Hemorrhoids   . Colonic polyp   . Bigeminy   . Chest pain   . DVT (deep venous thrombosis)   . Abnormal liver function   . GERD (gastroesophageal reflux disease)    Chart  review  Sees Dr. Johney Frame for Bigeminy-last seen by him 12/09/10 and poorly toelrates multiple meds including toprol, coreg, and verapamil  Never admitted to the Hospital before  Past Surgical History  Procedure Laterality Date  . Cholecystectomy      aprox 10 years ago  . Abdominal hysterectomy    . Knee surgery      Right  . Knee surgery      Left   Social History:  History   Social History Narrative   Regular exercise-yes   Caffeine use-yes   Lives with Husband and 3 graqdnsons and one is adopted   Husband drinks a lot-her husband drinks and smokes a lot   Used to be be married and is currently divorced   Daughters are on drugs     Allergies  Allergen Reactions  . Adhesive (Tape)     Band Aids  . Benicar (Olmesartan Medoxomil) Nausea Only and Other (See Comments)    Urinary difficulty  . Beta Adrenergic Blockers Other (See Comments)    Reaction unknown  . Cartia Xt (Diltiazem) Itching  . Cephalexin     REACTION: nausea, vomiting, diarrhea  . Codeine Other (See Comments)    tachycardia  . Colesevelam     REACTION: nausea  . Crestor (Rosuvastatin Calcium)  Other (See Comments)    Leg cramps  . Flagyl (Metronidazole) Other (See Comments)    nausea  . Fluvastatin Sodium     REACTION: itching  . Levemir (Insulin Detemir) Itching and Other (See Comments)    Bad mood  . Lisinopril     REACTION: nausea  . Metoprolol Succinate Other (See Comments)    Reaction unknown  . Nortriptyline Hcl Other (See Comments)    Reaction unknown  . Pamelor (Nortriptyline Hcl)     Nightmares  . Rosuvastatin     REACTION: cramps  . Sulfamethoxazole W-Trimethoprim     REACTION: nausea, irreg. heartrate  . Tradjenta (Linagliptin)     sob  . Verapamil Swelling and Other (See Comments)    Chest pain  . Welchol (Colesevelam Hcl) Nausea And Vomiting  . Trovan (Trovafloxacin) Rash and Other (See Comments)    Family History  Problem Relation Age of Onset  . Heart attack Mother 43   . Heart attack Brother 73  . Heart attack Maternal Grandmother 67  . Heart attack Maternal Grandfather 63  . Cancer Daughter     Ovarian Cancer  . Diabetes Other     Grandparent  . Heart disease Other     Grandparent    Prior to Admission medications   Medication Sig Start Date End Date Taking? Authorizing Provider  acetaminophen (TYLENOL) 500 MG tablet Take 1,000 mg by mouth daily as needed. For pain or headache   Yes Historical Provider, MD  aspirin 325 MG tablet Take 325 mg by mouth daily.   Yes Historical Provider, MD  Cyanocobalamin (VITAMIN B-12 PO) Take 1 tablet by mouth daily.   Yes Historical Provider, MD  diltiazem (CARDIZEM CD) 240 MG 24 hr capsule Take 240 mg by mouth daily.   Yes Historical Provider, MD  hydrochlorothiazide (HYDRODIURIL) 25 MG tablet Take 25 mg by mouth daily.   Yes Historical Provider, MD  insulin glargine (LANTUS) 100 UNIT/ML injection Inject 30 Units into the skin at bedtime.   Yes Historical Provider, MD  metFORMIN (GLUCOPHAGE) 1000 MG tablet Take 1,000 mg by mouth 2 (two) times daily with a meal.   Yes Historical Provider, MD  nadolol (CORGARD) 20 MG tablet Take 10 mg by mouth daily.    Yes Historical Provider, MD  ramipril (ALTACE) 10 MG capsule Take 10 mg by mouth 2 (two) times daily.    Yes Historical Provider, MD   Physical Exam: Filed Vitals:   12/13/12 1233 12/13/12 1405 12/13/12 1430 12/13/12 1515  BP: 148/62 117/64 107/77 120/69  Pulse:   85 79  Temp: 97.8 F (36.6 C)     TempSrc: Oral     Resp: 20 16 16 16   SpO2: 95% 96% 95% 96%     General:  Alert, pleasant obese CF in NAD  Eyes: no pallor or ict  ENT: Clinically clear, no JVD or bruit noted  Neck: Soft supple no thyromegaly  Cardiovascular: Large pendulous breasts S1-S2 no murmur rub or gallop sinus rhythm  Respiratory: Clinically clear no added sound  Abdomen: Soft nontender nondistended obese  Skin: No lower ext pitting edema  Musculoskeletal: Range of motion  intact  Psychiatric: Slightly anxious  Neurologic: Moves all 4 limbs equally  Labs on Admission:  Basic Metabolic Panel:  Recent Labs Lab 12/13/12 1304  NA 136  K 3.6  CL 96  CO2 24  GLUCOSE 223*  BUN 12  CREATININE 0.95  CALCIUM 10.1   Liver Function Tests: No results found  for this basename: AST, ALT, ALKPHOS, BILITOT, PROT, ALBUMIN,  in the last 168 hours No results found for this basename: LIPASE, AMYLASE,  in the last 168 hours No results found for this basename: AMMONIA,  in the last 168 hours CBC:  Recent Labs Lab 12/13/12 1304  WBC 7.9  NEUTROABS 5.0  HGB 13.0  HCT 35.3*  MCV 83.5  PLT 262   Cardiac Enzymes: No results found for this basename: CKTOTAL, CKMB, CKMBINDEX, TROPONINI,  in the last 168 hours  BNP (last 3 results) No results found for this basename: PROBNP,  in the last 8760 hours CBG: No results found for this basename: GLUCAP,  in the last 168 hours  Radiological Exams on Admission: Dg Chest 2 View  12/13/2012  *RADIOLOGY REPORT*  Clinical Data: Cough.  Chest pain.  History of hypertension.  CHEST - 2 VIEW  Comparison: 03/21/2012  Findings: Heart size is normal.  Mediastinal shadows are normal. There is mild scarring at the lung bases, unchanged since the previous study.  No evidence of infiltrate, collapse or effusion. No acute bony finding.  IMPRESSION: Chronic scarring at the lung bases.  No active disease.   Original Report Authenticated By: Paulina Fusi, M.D.     EKG: Independently reviewed. Sinus rhythm PR interval 0.12 QRS axis is -40 with left ventricle fascicular block precordial transition occurs at the C4 with no significant ST-T wave changes and low voltage complexes.  Compared to prior EKG 07/26/2012 there is no significant change  Assessment/Plan Principal Problem:   Chest pain Active Problems:   Mixed hyperlipidemia   BIGEMINY   HEMORRHOIDS, INTERNAL   DYSPNEA ON EXERTION   Essential hypertension, benign   1. Chest  pain-TIMI score about 2. Given patient has a history of hypertension diabetes mellitus and also family members with heart issues, I think it is reasonable to rule out overnight with cardiac enzymes and EKG in the morning. Further stratification as an outpatient. She has a cardiologist who can follow her up in about a week to week and half-continue aspirin 325 mg 2. History of frequent PVCs and bigeminy-follows with electrophysiology. Has been intolerant of multiple medications in the past. I suspect that this may be related to anxiety and patient endorsed same as well. We will continue all of her prior to admission medications inclusive of Cardizem 240 every 24, Nadolol 10 mg daily 3. History hypertension-continue above medications in addition to hydrochlorothiazide 25 mg daily, ramipril 10 mg twice a day 4. Dyspnea on exertion-suspect restrictive component given habitus next 5. Morbid obesity, There is no weight on file to calculate BMI. monitor as an outpatient      Code Status: Full  Family Communication: Discussed with family at bedside Disposition Plan: Observation telemetry   Time spent: 60 minutes  Mahala Menghini [facility] Pager [phone]  If 7PM-7AM, please contact night-coverage [email or web address] Password [facility] 12/13/2012, 4:19 PM

## 2012-12-14 ENCOUNTER — Ambulatory Visit: Payer: Medicare Other | Admitting: Endocrinology

## 2012-12-14 DIAGNOSIS — R079 Chest pain, unspecified: Secondary | ICD-10-CM | POA: Diagnosis not present

## 2012-12-14 LAB — BASIC METABOLIC PANEL
BUN: 12 mg/dL (ref 6–23)
CO2: 29 mEq/L (ref 19–32)
Calcium: 10.5 mg/dL (ref 8.4–10.5)
Chloride: 97 mEq/L (ref 96–112)
Creatinine, Ser: 0.9 mg/dL (ref 0.50–1.10)
GFR calc Af Amer: 76 mL/min — ABNORMAL LOW (ref >90)
GFR calc non Af Amer: 65 mL/min — ABNORMAL LOW (ref >90)
Glucose, Bld: 191 mg/dL — ABNORMAL HIGH (ref 70–99)
Potassium: 3.6 mEq/L (ref 3.5–5.1)
Sodium: 139 mEq/L (ref 135–145)

## 2012-12-14 LAB — CBC
HCT: 39.5 % (ref 36.0–46.0)
Hemoglobin: 14.2 g/dL (ref 12.0–15.0)
MCH: 30.2 pg (ref 26.0–34.0)
MCHC: 35.9 g/dL (ref 30.0–36.0)
MCV: 84 fL (ref 78.0–100.0)
Platelets: 306 10*3/uL (ref 150–400)
RBC: 4.7 MIL/uL (ref 3.87–5.11)
RDW: 11.6 % (ref 11.5–15.5)
WBC: 8.2 10*3/uL (ref 4.0–10.5)

## 2012-12-14 LAB — TROPONIN I
Troponin I: <0.3 ng/mL (ref <0.30)
Troponin I: <0.3 ng/mL (ref <0.30)

## 2012-12-14 LAB — GLUCOSE, CAPILLARY: Glucose-Capillary: 208 mg/dL — ABNORMAL HIGH (ref 70–99)

## 2012-12-14 NOTE — Discharge Summary (Signed)
Physician Discharge Summary  Alice Cole:[id number] DOB: [birth year] DOA: 12/13/2012  PCP: Elie Confer, MD  Admit date: 12/13/2012 Discharge date: 12/14/2012  Time spent: 22  minutes  Recommendations for Outpatient Follow-up:  1. Recommend close follow-up with Cardiology/electrophysiology for pre-existing bigeminy as well as rhythm issues. 2. Recommend counseling for potential anxiety 3. Recommend followup as outpatient over  Discharge Diagnoses:  Principal Problem:   Chest pain Active Problems:   Mixed hyperlipidemia   BIGEMINY   HEMORRHOIDS, INTERNAL   DYSPNEA ON EXERTION   Essential hypertension, benign   Discharge Condition: Good  Diet recommendation: Heart healthy low-salt  Filed Weights   12/14/12 0500  Weight: 83.4 kg (183 lb 13.8 oz)    History of present illness:  This is a 67 year old Caucasian female was admitted 12/13/2012 for chest pain. She was ambulant at the time and  shopping and developed a feeling of tightness in her chest and tachycardia. This happened before and she states that she just finished wearing a heart monitor per her cardiac doctor. She states that for the past 3-4 weeks she's been having some vague symptoms including leg arm pain and fatigue. She's compliant on her medications although there history of medication noncompliance /in tolerance. She states that there is some psychosocial stressors including having an adopted grandson who lives as her son at home with her and various other stressors in her life  Hospital Course:  Patient was observed overnight given she had a TIMI score of about 2 and was ruled out for acute coronary syndrome with serial troponins. On telemetry her heart rate was normal sinus rhythm with junctional rhythm She had no further issues overnight and was completely chest pain-free. Was recommended followup with outpatient physician from a cardiac standpoint/general standpoint  The rest of her cardiac issues are  stable    Discharge Exam: Filed Vitals:   12/13/12 1700 12/13/12 2146 12/14/12 0500 12/14/12 0530  BP: 154/83 117/47  138/62  Pulse: 93 84  77  Temp: 97.6 F (36.4 C) 97.5 F (36.4 C)  97.7 F (36.5 C)  TempSrc: Oral Oral  Oral  Resp: 18     Height:   5\' 6"  (1.676 m)   Weight:   83.4 kg (183 lb 13.8 oz)   SpO2: 99% 94%  97%    General: Alert pleasant oriented Cardiovascular: S1-S2 no murmur rub or gallop Respiratory: Clinically clear  Discharge Instructions  Discharge Orders   Future Appointments Provider Department Dept Phone   12/14/2012 9:00 AM Romero Belling, MD [facility] END[facility]CR[facility]OLOG[facility] [phone]   Future Orders Complete By Expires     Call MD for:  difficulty breathing, headache or visual disturbances  As directed     Call MD for:  persistant dizziness or light-headedness  As directed     Call MD for:  redness, tenderness, or signs of infection (pain, swelling, redness, odor or green/yellow discharge around incision site)  As directed     Call MD for:  severe uncontrolled pain  As directed     Call MD for:  temperature >100.4  As directed     Diet - low sodium heart healthy  As directed     Increase activity slowly  As directed         Medication List    STOP taking these medications       ramipril 10 MG capsule  Commonly known as:  ALTACE      TAKE these medications  acetaminophen 500 MG tablet  Commonly known as:  TYLENOL  Take 1,000 mg by mouth daily as needed. For pain or headache     aspirin 325 MG tablet  Take 325 mg by mouth daily.     diltiazem 240 MG 24 hr capsule  Commonly known as:  CARDIZEM CD  Take 240 mg by mouth daily.     hydrochlorothiazide 25 MG tablet  Commonly known as:  HYDRODIURIL  Take 25 mg by mouth daily.     insulin glargine 100 UNIT/ML injection  Commonly known as:  LANTUS  Inject 30 Units into the skin at bedtime.     metFORMIN 1000 MG tablet  Commonly known as:  GLUCOPHAGE  Take 1,000 mg by mouth 2  (two) times daily with a meal.     nadolol 20 MG tablet  Commonly known as:  CORGARD  Take 10 mg by mouth daily.     VITAMIN B-12 PO  Take 1 tablet by mouth daily.       Follow-up Information   Follow up with Quintella Reichert, MD In 7 days.   Contact information:   [address] [place] [address] [phone]       Follow up with Elie Confer, MD. Schedule an appointment as soon as possible for a visit in 1 week.   Contact information:   [address] [place] [address] [phone]        The results of significant diagnostics from this hospitalization (including imaging, microbiology, ancillary and laboratory) are listed below for reference.    Significant Diagnostic Studies: Dg Chest 2 View  12/13/2012  *RADIOLOGY REPORT*  Clinical Data: Cough.  Chest pain.  History of hypertension.  CHEST - 2 VIEW  Comparison: 03/21/2012  Findings: Heart size is normal.  Mediastinal shadows are normal. There is mild scarring at the lung bases, unchanged since the previous study.  No evidence of infiltrate, collapse or effusion. No acute bony finding.  IMPRESSION: Chronic scarring at the lung bases.  No active disease.   Original Report Authenticated By: Paulina Fusi, M.D.     Microbiology: No results found for this or any previous visit (from the past 240 hour(s)).   Labs: Basic Metabolic Panel:  Recent Labs Lab 12/13/12 1304 12/13/12 1830 12/14/12 0627  NA 136  --  139  K 3.6  --  3.6  CL 96  --  97  CO2 24  --  29  GLUCOSE 223*  --  191*  BUN 12  --  12  CREATININE 0.95 1.00 0.90  CALCIUM 10.1  --  10.5   Liver Function Tests: No results found for this basename: AST, ALT, ALKPHOS, BILITOT, PROT, ALBUMIN,  in the last 168 hours No results found for this basename: LIPASE, AMYLASE,  in the last 168 hours No results found for this basename: AMMONIA,  in the last 168 hours CBC:  Recent Labs Lab 12/13/12 1304 12/13/12 1830 12/14/12 0627   WBC 7.9 9.6 8.2  NEUTROABS 5.0  --   --   HGB 13.0 14.0 14.2  HCT 35.3* 39.0 39.5  MCV 83.5 84.1 84.0  PLT 262 292 306   Cardiac Enzymes:  Recent Labs Lab 12/13/12 1830 12/14/12 0006 12/14/12 0627  TROPONINI <0.30 <0.30 <0.30   BNP: BNP (last 3 results) No results found for this basename: PROBNP,  in the last 8760 hours CBG:  Recent Labs Lab 12/13/12 1816 12/13/12 2151  GLUCAP 187* 185*  SignedRhetta Mura  [facility] 12/14/2012, 8:15 AM

## 2012-12-14 NOTE — Progress Notes (Signed)
D/c orders received;IV removed with gauze on, pt remains in stable condition, pt meds and instructions reviewed and given to pt; pt d/c to home 

## 2012-12-19 DIAGNOSIS — G4733 Obstructive sleep apnea (adult) (pediatric): Secondary | ICD-10-CM | POA: Diagnosis not present

## 2012-12-19 DIAGNOSIS — I4949 Other premature depolarization: Secondary | ICD-10-CM | POA: Diagnosis not present

## 2012-12-19 DIAGNOSIS — R079 Chest pain, unspecified: Secondary | ICD-10-CM | POA: Diagnosis not present

## 2012-12-19 DIAGNOSIS — I119 Hypertensive heart disease without heart failure: Secondary | ICD-10-CM | POA: Diagnosis not present

## 2012-12-20 ENCOUNTER — Encounter: Payer: Self-pay | Admitting: Endocrinology

## 2012-12-20 ENCOUNTER — Ambulatory Visit (INDEPENDENT_AMBULATORY_CARE_PROVIDER_SITE_OTHER): Payer: Medicare Other | Admitting: Endocrinology

## 2012-12-20 VITALS — BP 158/70 | HR 90 | Wt 184.0 lb

## 2012-12-20 DIAGNOSIS — E119 Type 2 diabetes mellitus without complications: Secondary | ICD-10-CM | POA: Diagnosis not present

## 2012-12-20 LAB — HEMOGLOBIN A1C: Hgb A1c MFr Bld: 8.6 % — ABNORMAL HIGH (ref 4.6–6.5)

## 2012-12-20 NOTE — Progress Notes (Signed)
Subjective:    Patient ID: Alice Cole, female    DOB: May 27, 1946, 67 y.o.   MRN: [id number]  HPI pt returns for f/u of type 2 DM (dx'ed 1996; no chronic complications; she took insulin early in 2013, but she now wants to rx with orals if at all possible; she did not tolerate welchol or tradjenta).  She was hospitalized for chest pain, and was started on lantus.  However, she did not take it.  no cbg record, but states cbg's are in the 300's.   Past Medical History  Diagnosis Date  . Hypertension   . Hyperlipidemia   . Diabetes mellitus   . Dermatitis   . Diverticulitis     recurrent  . Adenomatous colon polyp   . Obstructive sleep apnea on CPAP     pt does not use  . Heart palpitations   . PVC's (premature ventricular contractions)   . Esophageal stricture   . Hemorrhoids   . Colonic polyp   . Bigeminy   . Chest pain   . DVT (deep venous thrombosis)   . Abnormal liver function   . GERD (gastroesophageal reflux disease)     Past Surgical History  Procedure Laterality Date  . Cholecystectomy      aprox 10 years ago  . Abdominal hysterectomy    . Knee surgery      Right  . Knee surgery      Left    History   Social History  . Marital Status: Married    Spouse Name: N/A    Number of Children: 2  . Years of Education: 11   Occupational History  . Retired    Social History Main Topics  . Smoking status: Never Smoker   . Smokeless tobacco: Not on file  . Alcohol Use: No     Comment: used to drink 30 yrs ago  . Drug Use: No  . Sexually Active: Not on file   Other Topics Concern  . Not on file   Social History Narrative   Regular exercise-yes   Caffeine use-yes   Lives with Husband and 3 graqdnsons and one is adopted   Husband drinks a lot-her husband drinks and smokes a lot   Used to be be married and is currently divorced   Daughters are on drugs    Current Outpatient Prescriptions on File Prior to Visit  Medication Sig Dispense Refill  .  acetaminophen (TYLENOL) 500 MG tablet Take 1,000 mg by mouth daily as needed. For pain or headache      . aspirin 325 MG tablet Take 325 mg by mouth daily.      . Cyanocobalamin (VITAMIN B-12 PO) Take 1 tablet by mouth daily.      Marland Kitchen diltiazem (CARDIZEM CD) 240 MG 24 hr capsule Take 240 mg by mouth daily.      . hydrochlorothiazide (HYDRODIURIL) 25 MG tablet Take 25 mg by mouth daily.      . metFORMIN (GLUCOPHAGE) 1000 MG tablet Take 1,000 mg by mouth 2 (two) times daily with a meal.      . nadolol (CORGARD) 20 MG tablet Take 10 mg by mouth daily.        No current facility-administered medications on file prior to visit.    Allergies  Allergen Reactions  . Adhesive (Tape)     Band Aids  . Benicar (Olmesartan Medoxomil) Nausea Only and Other (See Comments)    Urinary difficulty  . Beta Adrenergic Blockers Other (  See Comments)    Reaction unknown  . Cartia Xt (Diltiazem) Itching  . Cephalexin     REACTION: nausea, vomiting, diarrhea  . Codeine Other (See Comments)    tachycardia  . Colesevelam     REACTION: nausea  . Crestor (Rosuvastatin Calcium) Other (See Comments)    Leg cramps  . Flagyl (Metronidazole) Other (See Comments)    nausea  . Fluvastatin Sodium     REACTION: itching  . Levemir (Insulin Detemir) Itching and Other (See Comments)    Bad mood  . Lisinopril     REACTION: nausea  . Metoprolol Succinate Other (See Comments)    Reaction unknown  . Nortriptyline Hcl Other (See Comments)    Reaction unknown  . Pamelor (Nortriptyline Hcl)     Nightmares  . Rosuvastatin     REACTION: cramps  . Sulfamethoxazole W-Trimethoprim     REACTION: nausea, irreg. heartrate  . Tradjenta (Linagliptin)     sob  . Verapamil Swelling and Other (See Comments)    Chest pain  . Welchol (Colesevelam Hcl) Nausea And Vomiting  . Trovan (Trovafloxacin) Rash and Other (See Comments)    Family History  Problem Relation Age of Onset  . Heart attack Mother 3  . Heart attack Brother  57  . Heart attack Maternal Grandmother 67  . Heart attack Maternal Grandfather 63  . Cancer Daughter     Ovarian Cancer  . Diabetes Other     Grandparent  . Heart disease Other     Grandparent    BP 158/70  Pulse 90  Wt 184 lb (83.462 kg)  BMI 29.71 kg/m2  SpO2 94%    Review of Systems denies hypoglycemia.    Objective:   Physical Exam VITAL SIGNS:  See vs page GENERAL: no distress Pulses: dorsalis pedis intact bilat.   Feet: no deformity.  no ulcer on the feet.  feet are of normal color and temp.  no edema.  onychomycosis of toenails Neuro: sensation is intact to touch on the feet   Lab Results  Component Value Date   HGBA1C 8.6* 12/20/2012      Assessment & Plan:  DM: needs increased rx.  She wants to try to manage with orals if possible

## 2012-12-20 NOTE — Patient Instructions (Addendum)
blood tests are being requested for you today.  We'll contact you with results. check your blood sugar once a day.  vary the time of day when you check, between before the 3 meals, and at bedtime.  also check if you have symptoms of your blood sugar being too high or too low.  please keep a record of the readings and bring it to your next appointment here.  please call us sooner if your blood sugar goes below 70, or if you have a lot of readings over 200. Please come back for a follow-up appointment for 1 month.

## 2012-12-21 MED ORDER — BROMOCRIPTINE MESYLATE 2.5 MG PO TABS: 2.5000 mg | ORAL_TABLET | Freq: Every day | ORAL | Status: DC

## 2012-12-22 DIAGNOSIS — I4949 Other premature depolarization: Secondary | ICD-10-CM | POA: Diagnosis not present

## 2012-12-22 DIAGNOSIS — G4733 Obstructive sleep apnea (adult) (pediatric): Secondary | ICD-10-CM | POA: Diagnosis not present

## 2012-12-22 DIAGNOSIS — R079 Chest pain, unspecified: Secondary | ICD-10-CM | POA: Diagnosis not present

## 2012-12-22 DIAGNOSIS — I119 Hypertensive heart disease without heart failure: Secondary | ICD-10-CM | POA: Diagnosis not present

## 2012-12-26 DIAGNOSIS — I10 Essential (primary) hypertension: Secondary | ICD-10-CM | POA: Diagnosis not present

## 2012-12-26 DIAGNOSIS — IMO0001 Reserved for inherently not codable concepts without codable children: Secondary | ICD-10-CM | POA: Diagnosis not present

## 2012-12-26 DIAGNOSIS — E7800 Pure hypercholesterolemia, unspecified: Secondary | ICD-10-CM | POA: Diagnosis not present

## 2012-12-26 DIAGNOSIS — F411 Generalized anxiety disorder: Secondary | ICD-10-CM | POA: Diagnosis not present

## 2012-12-26 DIAGNOSIS — R002 Palpitations: Secondary | ICD-10-CM | POA: Diagnosis not present

## 2013-01-02 ENCOUNTER — Telehealth: Payer: Self-pay

## 2013-01-02 MED ORDER — NATEGLINIDE 120 MG PO TABS: 120.0000 mg | ORAL_TABLET | Freq: Three times a day (TID) | ORAL | Status: DC

## 2013-01-02 NOTE — Telephone Encounter (Signed)
please call patient: Please change bromocriptine to "nateglinide."  i have sent a prescription to your pharmacy.  This pill you would take 3 times a day (just before each meal). i'll see you next time.

## 2013-01-02 NOTE — Telephone Encounter (Signed)
Pills Dr. Everardo All gave her last week are making her sick to her stomach and she would like a different med sent in.

## 2013-01-05 NOTE — Telephone Encounter (Signed)
Pt advised.

## 2013-01-29 DIAGNOSIS — E669 Obesity, unspecified: Secondary | ICD-10-CM | POA: Diagnosis not present

## 2013-01-29 DIAGNOSIS — IMO0001 Reserved for inherently not codable concepts without codable children: Secondary | ICD-10-CM | POA: Diagnosis not present

## 2013-01-30 ENCOUNTER — Ambulatory Visit: Payer: Medicare Other | Admitting: Endocrinology

## 2013-03-13 DIAGNOSIS — R51 Headache: Secondary | ICD-10-CM | POA: Diagnosis not present

## 2013-03-13 DIAGNOSIS — R5383 Other fatigue: Secondary | ICD-10-CM | POA: Diagnosis not present

## 2013-03-13 DIAGNOSIS — R5381 Other malaise: Secondary | ICD-10-CM | POA: Diagnosis not present

## 2013-03-13 DIAGNOSIS — J309 Allergic rhinitis, unspecified: Secondary | ICD-10-CM | POA: Diagnosis not present

## 2013-03-13 DIAGNOSIS — M25559 Pain in unspecified hip: Secondary | ICD-10-CM | POA: Diagnosis not present

## 2013-03-13 DIAGNOSIS — IMO0001 Reserved for inherently not codable concepts without codable children: Secondary | ICD-10-CM | POA: Diagnosis not present

## 2013-03-28 DIAGNOSIS — M79609 Pain in unspecified limb: Secondary | ICD-10-CM | POA: Diagnosis not present

## 2013-04-02 DIAGNOSIS — M79609 Pain in unspecified limb: Secondary | ICD-10-CM | POA: Diagnosis not present

## 2013-04-02 DIAGNOSIS — R0602 Shortness of breath: Secondary | ICD-10-CM | POA: Diagnosis not present

## 2013-04-20 DIAGNOSIS — M79609 Pain in unspecified limb: Secondary | ICD-10-CM | POA: Diagnosis not present

## 2013-04-20 DIAGNOSIS — T23209A Burn of second degree of unspecified hand, unspecified site, initial encounter: Secondary | ICD-10-CM | POA: Diagnosis not present

## 2013-04-30 DIAGNOSIS — IMO0001 Reserved for inherently not codable concepts without codable children: Secondary | ICD-10-CM | POA: Diagnosis not present

## 2013-04-30 DIAGNOSIS — E669 Obesity, unspecified: Secondary | ICD-10-CM | POA: Diagnosis not present

## 2013-05-24 DIAGNOSIS — G44209 Tension-type headache, unspecified, not intractable: Secondary | ICD-10-CM | POA: Diagnosis not present

## 2013-06-05 DIAGNOSIS — H2010 Chronic iridocyclitis, unspecified eye: Secondary | ICD-10-CM | POA: Diagnosis not present

## 2013-06-05 DIAGNOSIS — E119 Type 2 diabetes mellitus without complications: Secondary | ICD-10-CM | POA: Diagnosis not present

## 2013-07-10 DIAGNOSIS — H15009 Unspecified scleritis, unspecified eye: Secondary | ICD-10-CM | POA: Diagnosis not present

## 2013-07-10 DIAGNOSIS — H35359 Cystoid macular degeneration, unspecified eye: Secondary | ICD-10-CM | POA: Diagnosis not present

## 2013-07-10 DIAGNOSIS — I10 Essential (primary) hypertension: Secondary | ICD-10-CM | POA: Diagnosis not present

## 2013-07-10 DIAGNOSIS — I4949 Other premature depolarization: Secondary | ICD-10-CM | POA: Diagnosis not present

## 2013-07-10 DIAGNOSIS — E119 Type 2 diabetes mellitus without complications: Secondary | ICD-10-CM | POA: Diagnosis not present

## 2013-07-10 DIAGNOSIS — H2010 Chronic iridocyclitis, unspecified eye: Secondary | ICD-10-CM | POA: Diagnosis not present

## 2013-07-13 ENCOUNTER — Telehealth: Payer: Self-pay | Admitting: Cardiology

## 2013-07-13 NOTE — Telephone Encounter (Signed)
Please find out if patient took any of the Doxazosin yet

## 2013-07-13 NOTE — Telephone Encounter (Signed)
Pt has not take the first dose of Doxazosin medication, because she is afraid to take it.

## 2013-07-13 NOTE — Telephone Encounter (Signed)
Please have her check her BP twice daily for over weekend and call with results on Monday 10/6

## 2013-07-13 NOTE — Telephone Encounter (Signed)
Please have her check her BP twice daily for over weekend and call with results on Monday 10/6  

## 2013-07-13 NOTE — Telephone Encounter (Signed)
New Problem:  Pt states she would like to speak to the nurse about some new meds Dr. Mayford Knife recently ordered for her. Please advise

## 2013-07-13 NOTE — Telephone Encounter (Signed)
Pt called because she said that on 07/10/13 Dr Mayford Knife prescribed Doxazosin 1 mg once a day; pt to take at night. Pt states the pharmacist advised pt to be cautious with this medication, because her BP can go down very fast, specially that she is taking other  BP medications; now pt is scare to take this medication. Pt states her BP is running 112/75 to 132/68. Pt check her BP after talking the med's. Pt would like to know if the MD can prescribed other medication in place of Doxazosin. Pt is aware that this message will be send to MD for reviewing.

## 2013-07-13 NOTE — Telephone Encounter (Signed)
Pt aware to check her BP twice a day for over the weekend, and call results on Monday 10/6. Pt verbalized understanding.

## 2013-07-16 ENCOUNTER — Telehealth: Payer: Self-pay | Admitting: Cardiology

## 2013-07-16 NOTE — Telephone Encounter (Signed)
New Problem//UPDATE  Pt states she was advised to check BP over the Weekend and call Monday with an update. The readings are:   141/80 pulse 85 @10 :30 am 10/4 136/72 pulse 75 @ 1:16pm 10/4  140/67 Pulse 73 @ 9:50am 10/5 135/67 Pulse 73 @ 8:42 pm 10/5  139/75 Pulse 82 @ 10:30am 10/6

## 2013-07-16 NOTE — Telephone Encounter (Signed)
Patient should not start Doxazosin.  Her BP is stable without it.

## 2013-07-16 NOTE — Telephone Encounter (Signed)
BPs reviewed and are within goal range.  Please have patient continue current medicines.

## 2013-07-16 NOTE — Telephone Encounter (Signed)
Pt is aware. Cancelled appt on 9th for bp check per TT.

## 2013-07-16 NOTE — Telephone Encounter (Signed)
To Dr Turner to review 

## 2013-07-19 ENCOUNTER — Ambulatory Visit: Payer: Medicare Other

## 2013-07-24 DIAGNOSIS — H348192 Central retinal vein occlusion, unspecified eye, stable: Secondary | ICD-10-CM | POA: Diagnosis not present

## 2013-07-24 DIAGNOSIS — H35359 Cystoid macular degeneration, unspecified eye: Secondary | ICD-10-CM | POA: Diagnosis not present

## 2013-07-25 DIAGNOSIS — Z23 Encounter for immunization: Secondary | ICD-10-CM | POA: Diagnosis not present

## 2013-07-25 DIAGNOSIS — K219 Gastro-esophageal reflux disease without esophagitis: Secondary | ICD-10-CM | POA: Diagnosis not present

## 2013-07-25 DIAGNOSIS — K5900 Constipation, unspecified: Secondary | ICD-10-CM | POA: Diagnosis not present

## 2013-08-01 DIAGNOSIS — IMO0001 Reserved for inherently not codable concepts without codable children: Secondary | ICD-10-CM | POA: Diagnosis not present

## 2013-08-01 DIAGNOSIS — Z6830 Body mass index (BMI) 30.0-30.9, adult: Secondary | ICD-10-CM | POA: Diagnosis not present

## 2013-08-01 DIAGNOSIS — Z713 Dietary counseling and surveillance: Secondary | ICD-10-CM | POA: Diagnosis not present

## 2013-08-27 DIAGNOSIS — E119 Type 2 diabetes mellitus without complications: Secondary | ICD-10-CM | POA: Diagnosis not present

## 2013-08-27 DIAGNOSIS — L299 Pruritus, unspecified: Secondary | ICD-10-CM | POA: Diagnosis not present

## 2013-08-27 DIAGNOSIS — R21 Rash and other nonspecific skin eruption: Secondary | ICD-10-CM | POA: Diagnosis not present

## 2013-08-27 DIAGNOSIS — I10 Essential (primary) hypertension: Secondary | ICD-10-CM | POA: Diagnosis not present

## 2013-09-03 DIAGNOSIS — T148XXA Other injury of unspecified body region, initial encounter: Secondary | ICD-10-CM | POA: Diagnosis not present

## 2013-09-10 ENCOUNTER — Ambulatory Visit
Admission: RE | Admit: 2013-09-10 | Discharge: 2013-09-10 | Disposition: A | Discharge status: Home / Self Care | Payer: Medicare Other | Source: Ambulatory Visit | Attending: Family Medicine | Admitting: Family Medicine

## 2013-09-10 ENCOUNTER — Other Ambulatory Visit: Payer: Self-pay | Admitting: Family Medicine

## 2013-09-10 DIAGNOSIS — R7989 Other specified abnormal findings of blood chemistry: Secondary | ICD-10-CM | POA: Diagnosis not present

## 2013-09-10 DIAGNOSIS — M549 Dorsalgia, unspecified: Secondary | ICD-10-CM

## 2013-09-10 DIAGNOSIS — N951 Menopausal and female climacteric states: Secondary | ICD-10-CM | POA: Diagnosis not present

## 2013-09-10 DIAGNOSIS — R300 Dysuria: Secondary | ICD-10-CM | POA: Diagnosis not present

## 2013-09-10 DIAGNOSIS — N390 Urinary tract infection, site not specified: Secondary | ICD-10-CM | POA: Diagnosis not present

## 2013-09-10 DIAGNOSIS — R079 Chest pain, unspecified: Secondary | ICD-10-CM | POA: Diagnosis not present

## 2013-09-12 DIAGNOSIS — K031 Abrasion of teeth: Secondary | ICD-10-CM | POA: Diagnosis not present

## 2013-09-12 DIAGNOSIS — L259 Unspecified contact dermatitis, unspecified cause: Secondary | ICD-10-CM | POA: Diagnosis not present

## 2013-09-12 DIAGNOSIS — L738 Other specified follicular disorders: Secondary | ICD-10-CM | POA: Diagnosis not present

## 2013-09-19 ENCOUNTER — Ambulatory Visit: Payer: Medicare Other | Admitting: Endocrinology

## 2013-09-21 DIAGNOSIS — I10 Essential (primary) hypertension: Secondary | ICD-10-CM | POA: Diagnosis not present

## 2013-09-21 DIAGNOSIS — F411 Generalized anxiety disorder: Secondary | ICD-10-CM | POA: Diagnosis not present

## 2013-09-21 DIAGNOSIS — L738 Other specified follicular disorders: Secondary | ICD-10-CM | POA: Diagnosis not present

## 2013-10-12 DIAGNOSIS — H348192 Central retinal vein occlusion, unspecified eye, stable: Secondary | ICD-10-CM | POA: Diagnosis not present

## 2013-10-12 DIAGNOSIS — E119 Type 2 diabetes mellitus without complications: Secondary | ICD-10-CM | POA: Diagnosis not present

## 2013-10-12 DIAGNOSIS — H15009 Unspecified scleritis, unspecified eye: Secondary | ICD-10-CM | POA: Diagnosis not present

## 2013-10-12 DIAGNOSIS — H35359 Cystoid macular degeneration, unspecified eye: Secondary | ICD-10-CM | POA: Diagnosis not present

## 2013-10-12 DIAGNOSIS — H2010 Chronic iridocyclitis, unspecified eye: Secondary | ICD-10-CM | POA: Diagnosis not present

## 2013-11-01 DIAGNOSIS — IMO0001 Reserved for inherently not codable concepts without codable children: Secondary | ICD-10-CM | POA: Diagnosis not present

## 2013-11-01 DIAGNOSIS — M949 Disorder of cartilage, unspecified: Secondary | ICD-10-CM | POA: Diagnosis not present

## 2013-11-01 DIAGNOSIS — M899 Disorder of bone, unspecified: Secondary | ICD-10-CM | POA: Diagnosis not present

## 2013-11-01 DIAGNOSIS — E669 Obesity, unspecified: Secondary | ICD-10-CM | POA: Diagnosis not present

## 2013-12-14 DIAGNOSIS — Z23 Encounter for immunization: Secondary | ICD-10-CM | POA: Diagnosis not present

## 2013-12-14 DIAGNOSIS — Z0000 Encounter for general adult medical examination without abnormal findings: Secondary | ICD-10-CM | POA: Diagnosis not present

## 2013-12-14 DIAGNOSIS — E7800 Pure hypercholesterolemia, unspecified: Secondary | ICD-10-CM | POA: Diagnosis not present

## 2013-12-14 DIAGNOSIS — R599 Enlarged lymph nodes, unspecified: Secondary | ICD-10-CM | POA: Diagnosis not present

## 2013-12-17 ENCOUNTER — Other Ambulatory Visit: Payer: Self-pay | Admitting: Family Medicine

## 2013-12-17 DIAGNOSIS — R591 Generalized enlarged lymph nodes: Secondary | ICD-10-CM

## 2013-12-18 ENCOUNTER — Ambulatory Visit
Admission: RE | Admit: 2013-12-18 | Discharge: 2013-12-18 | Disposition: A | Discharge status: Home / Self Care | Payer: Medicare Other | Source: Ambulatory Visit | Attending: Family Medicine | Admitting: Family Medicine

## 2013-12-18 DIAGNOSIS — R591 Generalized enlarged lymph nodes: Secondary | ICD-10-CM

## 2013-12-18 DIAGNOSIS — M5382 Other specified dorsopathies, cervical region: Secondary | ICD-10-CM | POA: Diagnosis not present

## 2013-12-25 DIAGNOSIS — M899 Disorder of bone, unspecified: Secondary | ICD-10-CM | POA: Diagnosis not present

## 2013-12-25 DIAGNOSIS — M949 Disorder of cartilage, unspecified: Secondary | ICD-10-CM | POA: Diagnosis not present

## 2014-01-15 DIAGNOSIS — H35359 Cystoid macular degeneration, unspecified eye: Secondary | ICD-10-CM | POA: Diagnosis not present

## 2014-01-15 DIAGNOSIS — H2010 Chronic iridocyclitis, unspecified eye: Secondary | ICD-10-CM | POA: Diagnosis not present

## 2014-01-15 DIAGNOSIS — E119 Type 2 diabetes mellitus without complications: Secondary | ICD-10-CM | POA: Diagnosis not present

## 2014-01-15 DIAGNOSIS — H35379 Puckering of macula, unspecified eye: Secondary | ICD-10-CM | POA: Diagnosis not present

## 2014-01-15 DIAGNOSIS — H348192 Central retinal vein occlusion, unspecified eye, stable: Secondary | ICD-10-CM | POA: Diagnosis not present

## 2014-01-15 DIAGNOSIS — H15009 Unspecified scleritis, unspecified eye: Secondary | ICD-10-CM | POA: Diagnosis not present

## 2014-02-12 DIAGNOSIS — K3189 Other diseases of stomach and duodenum: Secondary | ICD-10-CM | POA: Diagnosis not present

## 2014-02-12 DIAGNOSIS — M949 Disorder of cartilage, unspecified: Secondary | ICD-10-CM | POA: Diagnosis not present

## 2014-02-12 DIAGNOSIS — E669 Obesity, unspecified: Secondary | ICD-10-CM | POA: Diagnosis not present

## 2014-02-12 DIAGNOSIS — Z6830 Body mass index (BMI) 30.0-30.9, adult: Secondary | ICD-10-CM | POA: Diagnosis not present

## 2014-02-12 DIAGNOSIS — IMO0001 Reserved for inherently not codable concepts without codable children: Secondary | ICD-10-CM | POA: Diagnosis not present

## 2014-02-12 DIAGNOSIS — M899 Disorder of bone, unspecified: Secondary | ICD-10-CM | POA: Diagnosis not present

## 2014-02-15 DIAGNOSIS — K5732 Diverticulitis of large intestine without perforation or abscess without bleeding: Secondary | ICD-10-CM | POA: Diagnosis not present

## 2014-02-15 DIAGNOSIS — K3189 Other diseases of stomach and duodenum: Secondary | ICD-10-CM | POA: Diagnosis not present

## 2014-02-20 ENCOUNTER — Emergency Department (HOSPITAL_COMMUNITY): Payer: Medicare Other

## 2014-02-20 ENCOUNTER — Encounter (HOSPITAL_COMMUNITY): Payer: Self-pay | Admitting: Emergency Medicine

## 2014-02-20 ENCOUNTER — Emergency Department (HOSPITAL_COMMUNITY)
Admission: EM | Admit: 2014-02-20 | Discharge: 2014-02-20 | Disposition: A | Discharge status: Home / Self Care | Payer: Medicare Other | Attending: Emergency Medicine | Admitting: Emergency Medicine

## 2014-02-20 DIAGNOSIS — Z8719 Personal history of other diseases of the digestive system: Secondary | ICD-10-CM | POA: Diagnosis not present

## 2014-02-20 DIAGNOSIS — I10 Essential (primary) hypertension: Secondary | ICD-10-CM | POA: Diagnosis not present

## 2014-02-20 DIAGNOSIS — Z9089 Acquired absence of other organs: Secondary | ICD-10-CM | POA: Insufficient documentation

## 2014-02-20 DIAGNOSIS — Z9981 Dependence on supplemental oxygen: Secondary | ICD-10-CM | POA: Diagnosis not present

## 2014-02-20 DIAGNOSIS — Z860100 Personal history of colon polyps, unspecified: Secondary | ICD-10-CM | POA: Insufficient documentation

## 2014-02-20 DIAGNOSIS — R079 Chest pain, unspecified: Secondary | ICD-10-CM | POA: Diagnosis not present

## 2014-02-20 DIAGNOSIS — R7401 Elevation of levels of liver transaminase levels: Secondary | ICD-10-CM | POA: Insufficient documentation

## 2014-02-20 DIAGNOSIS — R1013 Epigastric pain: Secondary | ICD-10-CM | POA: Diagnosis not present

## 2014-02-20 DIAGNOSIS — R110 Nausea: Secondary | ICD-10-CM | POA: Insufficient documentation

## 2014-02-20 DIAGNOSIS — Z872 Personal history of diseases of the skin and subcutaneous tissue: Secondary | ICD-10-CM | POA: Diagnosis not present

## 2014-02-20 DIAGNOSIS — Z8619 Personal history of other infectious and parasitic diseases: Secondary | ICD-10-CM | POA: Insufficient documentation

## 2014-02-20 DIAGNOSIS — R0602 Shortness of breath: Secondary | ICD-10-CM | POA: Insufficient documentation

## 2014-02-20 DIAGNOSIS — I4949 Other premature depolarization: Secondary | ICD-10-CM | POA: Insufficient documentation

## 2014-02-20 DIAGNOSIS — R109 Unspecified abdominal pain: Secondary | ICD-10-CM | POA: Diagnosis not present

## 2014-02-20 DIAGNOSIS — Z794 Long term (current) use of insulin: Secondary | ICD-10-CM | POA: Insufficient documentation

## 2014-02-20 DIAGNOSIS — Z86718 Personal history of other venous thrombosis and embolism: Secondary | ICD-10-CM | POA: Insufficient documentation

## 2014-02-20 DIAGNOSIS — G4733 Obstructive sleep apnea (adult) (pediatric): Secondary | ICD-10-CM | POA: Diagnosis not present

## 2014-02-20 DIAGNOSIS — Z79899 Other long term (current) drug therapy: Secondary | ICD-10-CM | POA: Insufficient documentation

## 2014-02-20 DIAGNOSIS — Z90710 Acquired absence of both cervix and uterus: Secondary | ICD-10-CM | POA: Diagnosis not present

## 2014-02-20 DIAGNOSIS — Z7982 Long term (current) use of aspirin: Secondary | ICD-10-CM | POA: Diagnosis not present

## 2014-02-20 DIAGNOSIS — R7402 Elevation of levels of lactic acid dehydrogenase (LDH): Secondary | ICD-10-CM | POA: Insufficient documentation

## 2014-02-20 DIAGNOSIS — K5900 Constipation, unspecified: Secondary | ICD-10-CM | POA: Diagnosis not present

## 2014-02-20 DIAGNOSIS — E119 Type 2 diabetes mellitus without complications: Secondary | ICD-10-CM | POA: Diagnosis not present

## 2014-02-20 DIAGNOSIS — R740 Nonspecific elevation of levels of transaminase and lactic acid dehydrogenase [LDH]: Secondary | ICD-10-CM

## 2014-02-20 LAB — URINALYSIS, ROUTINE W REFLEX MICROSCOPIC
Bilirubin Urine: NEGATIVE
Glucose, UA: NEGATIVE mg/dL
Hgb urine dipstick: NEGATIVE
Ketones, ur: NEGATIVE mg/dL
Nitrite: NEGATIVE
Protein, ur: NEGATIVE mg/dL
Specific Gravity, Urine: 1.006 (ref 1.005–1.030)
Urobilinogen, UA: 0.2 mg/dL (ref 0.0–1.0)
pH: 6 (ref 5.0–8.0)

## 2014-02-20 LAB — COMPREHENSIVE METABOLIC PANEL
ALT: 44 U/L — ABNORMAL HIGH (ref 0–35)
AST: 34 U/L (ref 0–37)
Albumin: 4.3 g/dL (ref 3.5–5.2)
Alkaline Phosphatase: 69 U/L (ref 39–117)
BUN: 10 mg/dL (ref 6–23)
CO2: 26 mEq/L (ref 19–32)
Calcium: 10.4 mg/dL (ref 8.4–10.5)
Chloride: 100 mEq/L (ref 96–112)
Creatinine, Ser: 0.8 mg/dL (ref 0.50–1.10)
GFR calc Af Amer: 86 mL/min — ABNORMAL LOW (ref >90)
GFR calc non Af Amer: 75 mL/min — ABNORMAL LOW (ref >90)
Glucose, Bld: 223 mg/dL — ABNORMAL HIGH (ref 70–99)
Potassium: 4.4 mEq/L (ref 3.7–5.3)
Sodium: 140 mEq/L (ref 137–147)
Total Bilirubin: 0.5 mg/dL (ref 0.3–1.2)
Total Protein: 7.6 g/dL (ref 6.0–8.3)

## 2014-02-20 LAB — URINE MICROSCOPIC-ADD ON

## 2014-02-20 LAB — CBC
HCT: 38.2 % (ref 36.0–46.0)
Hemoglobin: 13.1 g/dL (ref 12.0–15.0)
MCH: 29.6 pg (ref 26.0–34.0)
MCHC: 34.3 g/dL (ref 30.0–36.0)
MCV: 86.4 fL (ref 78.0–100.0)
Platelets: 271 10*3/uL (ref 150–400)
RBC: 4.42 MIL/uL (ref 3.87–5.11)
RDW: 12.3 % (ref 11.5–15.5)
WBC: 7 10*3/uL (ref 4.0–10.5)

## 2014-02-20 LAB — LIPASE, BLOOD: Lipase: 21 U/L (ref 11–59)

## 2014-02-20 LAB — CBG MONITORING, ED: Glucose-Capillary: 192 mg/dL — ABNORMAL HIGH (ref 70–99)

## 2014-02-20 LAB — I-STAT TROPONIN, ED: Troponin i, poc: 0 ng/mL (ref 0.00–0.08)

## 2014-02-20 MED ORDER — GI COCKTAIL ~~LOC~~
30.0000 mL | Freq: Once | ORAL | Status: AC
  Administered 2014-02-20: 30 mL via ORAL
  Filled 2014-02-20: qty 30

## 2014-02-20 MED ORDER — FAMOTIDINE IN NACL 20-0.9 MG/50ML-% IV SOLN
20.0000 mg | Freq: Once | INTRAVENOUS | Status: AC
  Administered 2014-02-20: 20 mg via INTRAVENOUS
  Filled 2014-02-20: qty 50

## 2014-02-20 MED ORDER — SODIUM CHLORIDE 0.9 % IV BOLUS (SEPSIS)
1000.0000 mL | Freq: Once | INTRAVENOUS | Status: AC
  Administered 2014-02-20: 1000 mL via INTRAVENOUS

## 2014-02-20 MED ORDER — SUCRALFATE 1 G PO TABS: 1.0000 g | ORAL_TABLET | Freq: Three times a day (TID) | ORAL | Status: DC

## 2014-02-20 MED ORDER — DEXLANSOPRAZOLE 60 MG PO CPDR: 60.0000 mg | DELAYED_RELEASE_CAPSULE | Freq: Every day | ORAL | Status: DC

## 2014-02-20 NOTE — Discharge Instructions (Signed)
Helicobacter Pylori Antibodies Test This is a blood test which looks for a germ called Helicobacter pylori. This can also be diagnosed by a breath test or a microscopic examination of a portion (biopsy) of the small bowel. H. pylori is a germ that is found in the cells that line the stomach. It is a risk factor for stomach and small bowel ulcers, long standing inflammation of the lining of the stomach, or even ulcers that may occur in the esophagus (the canal that runs from the mouth to the stomach). This bacterium is also a factor in stomach cancer. The amount of the bacteria is found in about 10% of healthy persons younger than 69 years of age and the amount of the bacteria increases with age. Most persons with these bacteria have no symptoms; however, it is thought that when these bacteria cause ulcers, antibiotic medications can be used to help eliminate or reduce the problem.  PREPARATION FOR TEST No preparation or fasting is necessary. NORMAL FINDINGS Negative (H-pylori bacteria not present) Ranges for normal findingsmay vary among different laboratories and hospitals. You should always check with your doctor after having lab work or other tests done to discuss the meaning of your test results and whether or not your values are considered within normal limits. MEANING OF TEST  Your caregiver will go over the test results with you and discuss the importance and meaning of your results, as well as treatment options and the need for additional tests if necessary. It is your responsibility to obtain your test results. Ask the lab or department performing the test when and how you will get your results. OBTAINING THE TEST RESULTS It is your responsibility to obtain your test results. Ask the lab or department performing the test when and how you will get your results. Document Released: 10/21/2004 Document Revised: 12/20/2011 Document Reviewed: 09/07/2008 [facility] Patient Information 2014 [place],  [place]. Peptic Ulcer A peptic ulcer is a sore in the lining of in your esophagus (esophageal ulcer), stomach (gastric ulcer), or in the first part of your small intestine (duodenal ulcer). The ulcer causes erosion into the deeper tissue. CAUSES  Normally, the lining of the stomach and the small intestine protects itself from the acid that digests food. The protective lining can be damaged by:  An infection caused by a bacterium called Helicobacter pylori (H. pylori).  Regular use of nonsteroidal anti-inflammatory drugs (NSAIDs), such as ibuprofen or aspirin.  Smoking tobacco. Other risk factors include being older than 40, drinking alcohol excessively, and having a family history of ulcer disease.  SYMPTOMS   Burning pain or gnawing in the area between the chest and the belly button.  Heartburn.  Nausea and vomiting.  Bloating. The pain can be worse on an empty stomach and at night. If the ulcer results in bleeding, it can cause:  Black, tarry stools.  Vomiting of bright red blood.  Vomiting of coffee ground looking materials. DIAGNOSIS  A diagnosis is usually made based upon your history and an exam. Other tests and procedures may be performed to find the cause of the ulcer. Finding a cause will help determine the best treatment. Tests and procedures may include:  Blood tests, stool tests, or breath tests to check for the bacterium H. pylori.  An upper gastrointestinal (GI) series of the esophagus, stomach, and small intestine.  An endoscopy to examine the esophagus, stomach, and small intestine.  A biopsy. TREATMENT  Treatment may include:  Eliminating the cause of the ulcer, such  as smoking, NSAIDs, or alcohol.  Medicines to reduce the amount of acid in your digestive tract.  Antibiotic medicines if the ulcer is caused by the H. pylori bacterium.  An upper endoscopy to treat a bleeding ulcer.  Surgery if the bleeding is severe or if the ulcer created a hole somewhere  in the digestive system. HOME CARE INSTRUCTIONS   Avoid tobacco, alcohol, and caffeine. Smoking can increase the acid in the stomach, and continued smoking will impair the healing of ulcers.  Avoid foods and drinks that seem to cause discomfort or aggravate your ulcer.  Only take medicines as directed by your caregiver. Do not substitute over-the-counter medicines for prescription medicines without talking to your caregiver.  Keep any follow-up appointments and tests as directed. SEEK MEDICAL CARE IF:   Your do not improve within 7 days of starting treatment.  You have ongoing indigestion or heartburn. SEEK IMMEDIATE MEDICAL CARE IF:   You have sudden, sharp, or persistent abdominal pain.  You have bloody or dark black, tarry stools.  You vomit blood or vomit that looks like coffee grounds.  You become light headed, weak, or feel faint.  You become sweaty or clammy. MAKE SURE YOU:   Understand these instructions.  Will watch your condition.  Will get help right away if you are not doing well or get worse. Document Released: 09/24/2000 Document Revised: 06/21/2012 Document Reviewed: 04/26/2012 [facility] Patient Information 2014 [place].

## 2014-02-20 NOTE — ED Notes (Signed)
PA Abigail at bedside.  

## 2014-02-20 NOTE — ED Notes (Signed)
Md Mingo Amber at bedside.

## 2014-02-20 NOTE — ED Notes (Signed)
PA at bedside.

## 2014-02-20 NOTE — ED Notes (Signed)
Patient transported to X-ray 

## 2014-02-20 NOTE — ED Notes (Signed)
Contacted xray about wait time. Pt made aware of delay.

## 2014-02-20 NOTE — ED Notes (Signed)
Pt reports generalized abdominal pain x weeks. Seen by PCP and dx with h. Pylori. States she was given antibiotics but was unable to start them last night because "my stomach hurt so bad." Denies N/V/D. Pt also reports lower back pain x 1 week, worse with movement. Denies urinary symptoms. Pt in NAD.

## 2014-02-20 NOTE — ED Provider Notes (Signed)
CSN: [id number]     Arrival date & time 02/20/14  1324 History   First MD Initiated Contact with Patient 02/20/14 1107     Chief Complaint  Patient presents with  . Abdominal Pain     (Consider location/radiation/quality/duration/timing/severity/associated sxs/prior Treatment) HPI Alice Cole is a(n) 68 y.o. female who presents to the emergency department with chief complaint of abdominal pain. Patient is a poor historian. Past medical history of hypertension, hyperlipidemia, and diabetes, heart palpitations, GERD. Past surgical history of cholecystectomy She complains of 2 weeks of worsening epigastric pain, early satiety. She states she has been on he unable to tolerate foods because she has associated nausea and pain. The patient was recently diagnosed with H. pylori infection. She was given a prescription for clarithromycin and amoxicillin which he has been taking she does not take an acid reducer she is not on Carafate. The patient states that yesterday her pain became severe. She had associated left upper quadrant abdominal pain that radiated to her back. She states "my back hurts terribly." She has mild associated nausea without vomiting. She complains of chronic constipation. Last bowel movement was yesterday. She denies any vomiting. Today she had some associated central chest pain which she states was about a 4/10. She denies any diaphoresis, left jaw, left arm pain or paresthesia. Sheacute shortness of breath.   Past Medical History  Diagnosis Date  . Hypertension   . Hyperlipidemia   . Diabetes mellitus   . Dermatitis   . Diverticulitis     recurrent  . Adenomatous colon polyp   . Obstructive sleep apnea on CPAP     pt does not use  . Heart palpitations   . PVC's (premature ventricular contractions)   . Esophageal stricture   . Hemorrhoids   . Colonic polyp   . Bigeminy   . Chest pain   . DVT (deep venous thrombosis)   . Abnormal liver function   . GERD (gastroesophageal  reflux disease)    Past Surgical History  Procedure Laterality Date  . Cholecystectomy      aprox 10 years ago  . Abdominal hysterectomy    . Knee surgery      Right  . Knee surgery      Left   Family History  Problem Relation Age of Onset  . Heart attack Mother 9  . Heart attack Brother 45  . Heart attack Maternal Grandmother 67  . Heart attack Maternal Grandfather 63  . Cancer Daughter     Ovarian Cancer  . Diabetes Other     Grandparent  . Heart disease Other     Grandparent   History  Substance Use Topics  . Smoking status: Never Smoker   . Smokeless tobacco: Not on file  . Alcohol Use: No     Comment: used to drink 30 yrs ago   OB History   Grav Para Term Preterm Abortions TAB SAB Ect Mult Living                 Review of Systems  Ten systems reviewed and are negative for acute change, except as noted in the HPI.    Allergies  Tradjenta; Benicar; Beta adrenergic blockers; Cartia xt; Cephalexin; Codeine; Colesevelam; Crestor; Flagyl; Fluvastatin sodium; Levemir; Lisinopril; Metoprolol succinate; Nortriptyline hcl; Pamelor; Rosuvastatin; Sulfamethoxazole-trimethoprim; Verapamil; Welchol; Adhesive; and Trovan  Home Medications   Prior to Admission medications   Medication Sig Start Date End Date Taking? Authorizing Provider  acetaminophen (TYLENOL) 500 MG tablet  Take 1,000 mg by mouth every 8 (eight) hours as needed for mild pain. For pain or headache   Yes Historical Provider, MD  aspirin 325 MG tablet Take 325 mg by mouth daily.   Yes Historical Provider, MD  diltiazem (CARDIZEM CD) 240 MG 24 hr capsule Take 240 mg by mouth daily.   Yes Historical Provider, MD  hydrochlorothiazide (HYDRODIURIL) 25 MG tablet Take 25 mg by mouth daily.   Yes Historical Provider, MD  insulin glargine (LANTUS) 100 UNIT/ML injection Inject 55 Units into the skin daily after breakfast.   Yes Historical Provider, MD  metFORMIN (GLUCOPHAGE) 1000 MG tablet Take 1,000 mg by mouth 2  (two) times daily with a meal.   Yes Historical Provider, MD  Multiple Vitamin (MULTIVITAMIN WITH MINERALS) TABS tablet Take 1 tablet by mouth daily.   Yes Historical Provider, MD  ramipril (ALTACE) 10 MG capsule Take 10 mg by mouth 2 (two) times daily.   Yes Historical Provider, MD   BP 161/57  Pulse 85  Temp(Src) 99 F (37.2 C) (Oral)  Resp 15  Ht 5\' 7"  (1.702 m)  Wt 186 lb (84.369 kg)  BMI 29.12 kg/m2  SpO2 98% Physical Exam Physical Exam  Nursing note and vitals reviewed. Constitutional: She is oriented to person, place, and time. She appears well-developed and well-nourished. No distress.  HENT:  Head: Normocephalic and atraumatic.  Eyes: Conjunctivae normal and EOM are normal. Pupils are equal, round, and reactive to light. No scleral icterus.  Neck: Normal range of motion.  Cardiovascular: Normal rate, regular rhythm and normal heart sounds.  Exam reveals no gallop and no friction rub.   No murmur heard. Pulmonary/Chest: Effort normal and breath sounds normal. No respiratory distress.  Abdominal: Soft. Bowel sounds are normal. She exhibits no distension and no mass. Moderate epigastric tenderness. Neurological: She is alert and oriented to person, place, and time.  Skin: Skin is warm and dry. She is not diaphoretic.    ED Course  Procedures (including critical care time) Labs Review Labs Reviewed  COMPREHENSIVE METABOLIC PANEL - Abnormal; Notable for the following:    Glucose, Bld 223 (*)    ALT 44 (*)    GFR calc non Af Amer 75 (*)    GFR calc Af Amer 86 (*)    All other components within normal limits  CBG MONITORING, ED - Abnormal; Notable for the following:    Glucose-Capillary 192 (*)    All other components within normal limits  CBC  LIPASE, BLOOD  URINALYSIS, ROUTINE W REFLEX MICROSCOPIC  I-STAT TROPOININ, ED    Imaging Review No results found.   EKG Interpretation None      MDM   Final diagnoses:  None    12:29 PM BP 149/63  Pulse 88   Temp(Src) 99 F (37.2 C) (Oral)  Resp 15  Ht 5\' 7"  (1.702 m)  Wt 186 lb (84.369 kg)  BMI 29.12 kg/m2  SpO2 98% Patient with moderate to severe epigastric pain. Differential diagnosis includes pancreatitis, gastritis, peptic ulcer disease, acute coronary syndrome. Given her presentation I feel more likely that this is due to peptic ulcer disease or gastritis. The patient is minimizing her chest pain. She is a negative troponin without concern for ischemic changes. The patient was able to hold down her metformin this morning which he took with one banana. Her CMP shows mild elevation of her ALT. Patient also has a normal lipase. Given her lack findings (more likely due to gastric issues. Will  treat symptomatically with IV Pepcid and GI cocktail. No imaging seems necessary at this time.  2:14 PM BP 132/58  Pulse 67  Temp(Src) 99 F (37.2 C) (Oral)  Resp 15  Ht 5\' 7"  (1.702 m)  Wt 186 lb (84.369 kg)  BMI 29.12 kg/m2  SpO2 96% Patient seen in shared visit with Dr. Jerelyn Charles. She  States that all of her sxs have resolved. We will get an acute abdominal series.   4:29 PM Xray negative. Patient is nontoxic, nonseptic appearing, in no apparent distress.  Patient's pain and other symptoms adequately managed in emergency department.  Fluid bolus given.  Labs, imaging and vitals reviewed.  Patient does not meet the SIRS or Sepsis criteria.  On repeat exam patient does not have a surgical abdomin and there are nor peritoneal signs.  No indication of appendicitis, bowel obstruction, bowel perforation, cholecystitis, diverticulitis.  Patient discharged home with symptomatic treatment and given strict instructions for follow-up with their primary care physician.  I have also discussed reasons to return immediately to the ER.  Patient expresses understanding and agrees with plan.     Margarita Mail, PA-C 02/20/14 1629

## 2014-02-20 NOTE — ED Notes (Signed)
Pt was recenltly dx with h. Pylori and states she cannot eat or hold anything down. Sore spot in center of the chest; back pain; difficulty swallowing; gets sick when eats something. Has not been taking insulin since she has not been eatting.

## 2014-02-21 NOTE — ED Provider Notes (Signed)
Medical screening examination/treatment/procedure(s) were conducted as a shared visit with non-physician practitioner(s) and myself.  I personally evaluated the patient during the encounter.   EKG Interpretation   Date/Time:  Wednesday Feb 20 2014 09:59:42 EDT Ventricular Rate:  89 PR Interval:  178 QRS Duration: 82 QT Interval:  362 QTC Calculation: 440 R Axis:   -78 Text Interpretation:  Normal sinus rhythm Left axis deviation Low voltage  QRS Cannot rule out Anterior infarct , age undetermined Abnormal ECG  Similar to March 2/14 Confirmed by Mingo Amber  MD, [place] [phone]) on 02/20/2014  1:43:55 PM       Patient here with abdominal pain. Recent diagnosis of PUD - not given PPI - only on amoxicillin and clarithromycin. Patient here with burning epigastric pain. CP atypical, normal EKG, similar to prior, negative troponin. Abd pain going on for a several days, worsening last night. Normal AAS, abdominal exam benign on my exam. Patient stable for discharge, no further imaging warranted.  Osvaldo Shipper, MD 02/21/14 [phone]

## 2014-02-25 ENCOUNTER — Other Ambulatory Visit: Payer: Self-pay | Admitting: Gastroenterology

## 2014-02-25 DIAGNOSIS — R6889 Other general symptoms and signs: Secondary | ICD-10-CM | POA: Diagnosis not present

## 2014-02-25 DIAGNOSIS — R109 Unspecified abdominal pain: Secondary | ICD-10-CM

## 2014-02-27 ENCOUNTER — Ambulatory Visit
Admission: RE | Admit: 2014-02-27 | Discharge: 2014-02-27 | Disposition: A | Discharge status: Home / Self Care | Payer: Medicare Other | Source: Ambulatory Visit | Attending: Gastroenterology | Admitting: Gastroenterology

## 2014-02-27 DIAGNOSIS — K5710 Diverticulosis of small intestine without perforation or abscess without bleeding: Secondary | ICD-10-CM | POA: Diagnosis not present

## 2014-02-27 DIAGNOSIS — R109 Unspecified abdominal pain: Secondary | ICD-10-CM

## 2014-03-02 DIAGNOSIS — R1013 Epigastric pain: Secondary | ICD-10-CM | POA: Diagnosis not present

## 2014-03-05 DIAGNOSIS — L259 Unspecified contact dermatitis, unspecified cause: Secondary | ICD-10-CM | POA: Diagnosis not present

## 2014-03-12 ENCOUNTER — Telehealth: Payer: Self-pay | Admitting: Cardiology

## 2014-03-12 DIAGNOSIS — I10 Essential (primary) hypertension: Secondary | ICD-10-CM | POA: Diagnosis not present

## 2014-03-12 DIAGNOSIS — R002 Palpitations: Secondary | ICD-10-CM | POA: Diagnosis not present

## 2014-03-12 NOTE — Telephone Encounter (Signed)
Pt concerned because her resting heart rate has been elevated off and on since diagnosis of H Pylori about 2 weeks ago. She has been placed on antibiotics and will finish a 2 week course this Friday. Pt was evaluated in ED 02/20/14 a few days after being placed on antibiotics. She states she is drinking plenty of fluids now. She states her resting heart rate is staying around 100 and today her BP is 173/78 (it was 127/60-70 last night). I reviewed with Dr Radford Pax and she recommended that patient see her PCP for further evaluation. Pt agreed with this plan.

## 2014-03-12 NOTE — Telephone Encounter (Signed)
New message    patient taken antibiotic - amoxicillin , clarithromycin 500 mg twice a day  Omeprazole  20 mg  from her PCP . C/O heart racing  102 now . PCP was not contacted.

## 2014-04-10 ENCOUNTER — Encounter: Payer: Self-pay | Admitting: *Deleted

## 2014-05-15 ENCOUNTER — Other Ambulatory Visit: Payer: Self-pay | Admitting: Gastroenterology

## 2014-05-15 DIAGNOSIS — K5730 Diverticulosis of large intestine without perforation or abscess without bleeding: Secondary | ICD-10-CM | POA: Diagnosis not present

## 2014-05-15 DIAGNOSIS — Z86010 Personal history of colonic polyps: Secondary | ICD-10-CM | POA: Diagnosis not present

## 2014-05-15 DIAGNOSIS — Z09 Encounter for follow-up examination after completed treatment for conditions other than malignant neoplasm: Secondary | ICD-10-CM | POA: Diagnosis not present

## 2014-05-15 DIAGNOSIS — D126 Benign neoplasm of colon, unspecified: Secondary | ICD-10-CM | POA: Diagnosis not present

## 2014-06-03 DIAGNOSIS — K5732 Diverticulitis of large intestine without perforation or abscess without bleeding: Secondary | ICD-10-CM | POA: Diagnosis not present

## 2014-06-09 DIAGNOSIS — K5732 Diverticulitis of large intestine without perforation or abscess without bleeding: Secondary | ICD-10-CM | POA: Diagnosis not present

## 2014-06-11 DIAGNOSIS — K5732 Diverticulitis of large intestine without perforation or abscess without bleeding: Secondary | ICD-10-CM | POA: Diagnosis not present

## 2014-06-11 DIAGNOSIS — Z23 Encounter for immunization: Secondary | ICD-10-CM | POA: Diagnosis not present

## 2014-06-12 ENCOUNTER — Other Ambulatory Visit: Payer: Self-pay | Admitting: Family Medicine

## 2014-06-12 DIAGNOSIS — R1084 Generalized abdominal pain: Secondary | ICD-10-CM

## 2014-06-14 ENCOUNTER — Ambulatory Visit
Admission: RE | Admit: 2014-06-14 | Discharge: 2014-06-14 | Disposition: A | Discharge status: Home / Self Care | Payer: Medicare Other | Source: Ambulatory Visit | Attending: Family Medicine | Admitting: Family Medicine

## 2014-06-14 DIAGNOSIS — K7689 Other specified diseases of liver: Secondary | ICD-10-CM | POA: Diagnosis not present

## 2014-06-14 DIAGNOSIS — R1084 Generalized abdominal pain: Secondary | ICD-10-CM

## 2014-06-14 DIAGNOSIS — K5732 Diverticulitis of large intestine without perforation or abscess without bleeding: Secondary | ICD-10-CM | POA: Diagnosis not present

## 2014-06-14 DIAGNOSIS — K838 Other specified diseases of biliary tract: Secondary | ICD-10-CM | POA: Diagnosis not present

## 2014-06-14 MED ORDER — IOHEXOL 300 MG/ML  SOLN
100.0000 mL | Freq: Once | INTRAMUSCULAR | Status: AC | PRN
  Administered 2014-06-14: 100 mL via INTRAVENOUS

## 2014-07-01 DIAGNOSIS — J329 Chronic sinusitis, unspecified: Secondary | ICD-10-CM | POA: Diagnosis not present

## 2014-07-02 DIAGNOSIS — J029 Acute pharyngitis, unspecified: Secondary | ICD-10-CM | POA: Diagnosis not present

## 2014-07-02 DIAGNOSIS — J040 Acute laryngitis: Secondary | ICD-10-CM | POA: Diagnosis not present

## 2014-07-02 DIAGNOSIS — J0100 Acute maxillary sinusitis, unspecified: Secondary | ICD-10-CM | POA: Diagnosis not present

## 2014-07-02 DIAGNOSIS — J40 Bronchitis, not specified as acute or chronic: Secondary | ICD-10-CM | POA: Diagnosis not present

## 2014-07-09 DIAGNOSIS — J029 Acute pharyngitis, unspecified: Secondary | ICD-10-CM | POA: Diagnosis not present

## 2014-07-09 DIAGNOSIS — J322 Chronic ethmoidal sinusitis: Secondary | ICD-10-CM | POA: Diagnosis not present

## 2014-10-15 DIAGNOSIS — E119 Type 2 diabetes mellitus without complications: Secondary | ICD-10-CM | POA: Diagnosis not present

## 2014-10-15 DIAGNOSIS — M8580 Other specified disorders of bone density and structure, unspecified site: Secondary | ICD-10-CM | POA: Diagnosis not present

## 2014-11-12 DIAGNOSIS — B349 Viral infection, unspecified: Secondary | ICD-10-CM | POA: Diagnosis not present

## 2014-12-03 DIAGNOSIS — F419 Anxiety disorder, unspecified: Secondary | ICD-10-CM | POA: Diagnosis not present

## 2014-12-03 DIAGNOSIS — I10 Essential (primary) hypertension: Secondary | ICD-10-CM | POA: Diagnosis not present

## 2014-12-17 DIAGNOSIS — H2513 Age-related nuclear cataract, bilateral: Secondary | ICD-10-CM | POA: Diagnosis not present

## 2014-12-17 DIAGNOSIS — E119 Type 2 diabetes mellitus without complications: Secondary | ICD-10-CM | POA: Diagnosis not present

## 2014-12-17 DIAGNOSIS — H35372 Puckering of macula, left eye: Secondary | ICD-10-CM | POA: Diagnosis not present

## 2014-12-24 DIAGNOSIS — H02834 Dermatochalasis of left upper eyelid: Secondary | ICD-10-CM | POA: Diagnosis not present

## 2014-12-24 DIAGNOSIS — H02831 Dermatochalasis of right upper eyelid: Secondary | ICD-10-CM | POA: Diagnosis not present

## 2014-12-24 DIAGNOSIS — H2511 Age-related nuclear cataract, right eye: Secondary | ICD-10-CM | POA: Diagnosis not present

## 2014-12-24 DIAGNOSIS — H35372 Puckering of macula, left eye: Secondary | ICD-10-CM | POA: Diagnosis not present

## 2015-01-06 DIAGNOSIS — K5792 Diverticulitis of intestine, part unspecified, without perforation or abscess without bleeding: Secondary | ICD-10-CM | POA: Diagnosis not present

## 2015-01-06 DIAGNOSIS — R2681 Unsteadiness on feet: Secondary | ICD-10-CM | POA: Diagnosis not present

## 2015-01-09 DIAGNOSIS — H2511 Age-related nuclear cataract, right eye: Secondary | ICD-10-CM | POA: Diagnosis not present

## 2015-01-09 DIAGNOSIS — H25811 Combined forms of age-related cataract, right eye: Secondary | ICD-10-CM | POA: Diagnosis not present

## 2015-01-16 DIAGNOSIS — E119 Type 2 diabetes mellitus without complications: Secondary | ICD-10-CM | POA: Diagnosis not present

## 2015-01-16 DIAGNOSIS — M8580 Other specified disorders of bone density and structure, unspecified site: Secondary | ICD-10-CM | POA: Diagnosis not present

## 2015-01-20 DIAGNOSIS — R109 Unspecified abdominal pain: Secondary | ICD-10-CM | POA: Diagnosis not present

## 2015-01-21 DIAGNOSIS — H35372 Puckering of macula, left eye: Secondary | ICD-10-CM | POA: Diagnosis not present

## 2015-02-26 DIAGNOSIS — Z09 Encounter for follow-up examination after completed treatment for conditions other than malignant neoplasm: Secondary | ICD-10-CM | POA: Diagnosis not present

## 2015-02-26 DIAGNOSIS — H35372 Puckering of macula, left eye: Secondary | ICD-10-CM | POA: Diagnosis not present

## 2015-03-05 DIAGNOSIS — L304 Erythema intertrigo: Secondary | ICD-10-CM | POA: Diagnosis not present

## 2015-03-25 ENCOUNTER — Encounter: Payer: Self-pay | Admitting: Gastroenterology

## 2015-04-22 DIAGNOSIS — R109 Unspecified abdominal pain: Secondary | ICD-10-CM | POA: Diagnosis not present

## 2015-05-23 DIAGNOSIS — Z961 Presence of intraocular lens: Secondary | ICD-10-CM | POA: Diagnosis not present

## 2015-05-23 DIAGNOSIS — H2512 Age-related nuclear cataract, left eye: Secondary | ICD-10-CM | POA: Diagnosis not present

## 2015-06-23 DIAGNOSIS — Z794 Long term (current) use of insulin: Secondary | ICD-10-CM | POA: Diagnosis not present

## 2015-06-23 DIAGNOSIS — M8580 Other specified disorders of bone density and structure, unspecified site: Secondary | ICD-10-CM | POA: Diagnosis not present

## 2015-06-23 DIAGNOSIS — E1165 Type 2 diabetes mellitus with hyperglycemia: Secondary | ICD-10-CM | POA: Diagnosis not present

## 2015-08-15 DIAGNOSIS — H2512 Age-related nuclear cataract, left eye: Secondary | ICD-10-CM | POA: Diagnosis not present

## 2015-08-15 DIAGNOSIS — H3561 Retinal hemorrhage, right eye: Secondary | ICD-10-CM | POA: Diagnosis not present

## 2015-08-15 DIAGNOSIS — H20021 Recurrent acute iridocyclitis, right eye: Secondary | ICD-10-CM | POA: Diagnosis not present

## 2015-08-15 DIAGNOSIS — Z961 Presence of intraocular lens: Secondary | ICD-10-CM | POA: Diagnosis not present

## 2015-08-20 DIAGNOSIS — Z961 Presence of intraocular lens: Secondary | ICD-10-CM | POA: Diagnosis not present

## 2015-08-20 DIAGNOSIS — H2512 Age-related nuclear cataract, left eye: Secondary | ICD-10-CM | POA: Diagnosis not present

## 2015-08-20 DIAGNOSIS — H20021 Recurrent acute iridocyclitis, right eye: Secondary | ICD-10-CM | POA: Diagnosis not present

## 2015-08-28 DIAGNOSIS — Z23 Encounter for immunization: Secondary | ICD-10-CM | POA: Diagnosis not present

## 2015-09-10 DIAGNOSIS — Z961 Presence of intraocular lens: Secondary | ICD-10-CM | POA: Diagnosis not present

## 2015-09-10 DIAGNOSIS — H20021 Recurrent acute iridocyclitis, right eye: Secondary | ICD-10-CM | POA: Diagnosis not present

## 2015-09-10 DIAGNOSIS — H2512 Age-related nuclear cataract, left eye: Secondary | ICD-10-CM | POA: Diagnosis not present

## 2015-09-23 DIAGNOSIS — M8580 Other specified disorders of bone density and structure, unspecified site: Secondary | ICD-10-CM | POA: Diagnosis not present

## 2015-09-23 DIAGNOSIS — Z794 Long term (current) use of insulin: Secondary | ICD-10-CM | POA: Diagnosis not present

## 2015-09-23 DIAGNOSIS — Z5181 Encounter for therapeutic drug level monitoring: Secondary | ICD-10-CM | POA: Diagnosis not present

## 2015-09-23 DIAGNOSIS — E1165 Type 2 diabetes mellitus with hyperglycemia: Secondary | ICD-10-CM | POA: Diagnosis not present

## 2015-09-23 DIAGNOSIS — E559 Vitamin D deficiency, unspecified: Secondary | ICD-10-CM | POA: Diagnosis not present

## 2015-09-23 DIAGNOSIS — Z79899 Other long term (current) drug therapy: Secondary | ICD-10-CM | POA: Diagnosis not present

## 2015-11-04 DIAGNOSIS — M549 Dorsalgia, unspecified: Secondary | ICD-10-CM | POA: Diagnosis not present

## 2015-11-04 DIAGNOSIS — R399 Unspecified symptoms and signs involving the genitourinary system: Secondary | ICD-10-CM | POA: Diagnosis not present

## 2015-11-04 LAB — POC URINALYSIS WITH MICROSCOPIC (NON AUTO)MANUAL RESULT

## 2015-11-21 DIAGNOSIS — R6889 Other general symptoms and signs: Secondary | ICD-10-CM | POA: Diagnosis not present

## 2015-11-21 DIAGNOSIS — R05 Cough: Secondary | ICD-10-CM | POA: Diagnosis not present

## 2015-12-15 DIAGNOSIS — N3000 Acute cystitis without hematuria: Secondary | ICD-10-CM | POA: Diagnosis not present

## 2015-12-15 DIAGNOSIS — R319 Hematuria, unspecified: Secondary | ICD-10-CM | POA: Diagnosis not present

## 2015-12-24 ENCOUNTER — Encounter: Payer: Self-pay | Admitting: Cardiology

## 2015-12-24 DIAGNOSIS — R319 Hematuria, unspecified: Secondary | ICD-10-CM | POA: Diagnosis not present

## 2015-12-24 NOTE — Progress Notes (Signed)
Cardiology Office Note   Date:  12/25/2015   ID:  Alice Cole, DOB [birth year], MRN [id number]  PCP:  Jonathon Bellows, MD    Chief Complaint  Patient presents with  . Irregular Heart Beat    SOB with activity  . Hypertension      History of Present Illness: Alice Cole is a 70 y.o. female who presents for evaluation of OSA.  She has a history of OSA but does not use CPAP on a regular basis.  She has a history of PVCs and normal coronary arteries by cath in 2003.  She has noticed recently that she has been having some DOE when she walks down the driveway or exerts herself.  She denies any chest pain or pressure.  She denies any dizziness, or syncope.  She does not really notice any palptiations.  She occasionally will have some LE edema.      Past Medical History  Diagnosis Date  . Hypertension   . Hyperlipidemia   . Diabetes mellitus   . Dermatitis   . Diverticulitis     recurrent  . Adenomatous colon polyp   . Obstructive sleep apnea on CPAP     pt does not use  . Heart palpitations   . PVC's (premature ventricular contractions)   . Esophageal stricture   . Hemorrhoids   . Colonic polyp   . Bigeminy   . Chest pain   . DVT (deep venous thrombosis) ([place])   . Abnormal liver function   . GERD (gastroesophageal reflux disease)     Past Surgical History  Procedure Laterality Date  . Cholecystectomy      aprox 10 years ago  . Abdominal hysterectomy    . Knee surgery      Right  . Knee surgery      Left     Current Outpatient Prescriptions  Medication Sig Dispense Refill  . aspirin 325 MG tablet Take 325 mg by mouth daily.    Marland Kitchen atorvastatin (LIPITOR) 40 MG tablet Take 40 mg by mouth daily.    Marland Kitchen diltiazem (CARDIZEM CD) 240 MG 24 hr capsule Take 240 mg by mouth daily.    . hydrochlorothiazide (HYDRODIURIL) 25 MG tablet Take 25 mg by mouth daily.    Marland Kitchen LANTUS SOLOSTAR 100 UNIT/ML Solostar Pen Take 80 Units by mouth daily.  3  . metFORMIN  (GLUCOPHAGE) 1000 MG tablet Take 1,000 mg by mouth 2 (two) times daily with a meal.    . omeprazole (PRILOSEC) 20 MG capsule Take 20 mg by mouth daily.    . ramipril (ALTACE) 10 MG capsule Take 10 mg by mouth 2 (two) times daily.     No current facility-administered medications for this visit.    Allergies:   Metoprolol; Nortriptyline; Olmesartan; Tradjenta; Alatrofloxacin; Benicar; Beta adrenergic blockers; Cartia xt; Cephalexin; Codeine; Colesevelam; Crestor; Flagyl; Fluvastatin sodium; Levemir; Lisinopril; Metoprolol succinate; Nortriptyline hcl; Pamelor; Rosuvastatin; Sulfamethoxazole-trimethoprim; Verapamil; Welchol; Adhesive; Sitagliptin; and Trovan    Social History:  The patient  reports that she has never smoked. She does not have any smokeless tobacco history on file. She reports that she does not drink alcohol or use illicit drugs.   Family History:  The patient's family history includes Cancer in her daughter; Diabetes in her other; Heart attack (age of onset: 70) in her mother; Heart attack (age of onset: 33) in her maternal grandfather; Heart  attack (age of onset: 32) in her maternal grandmother; Heart attack (age of onset: 62) in her brother; Heart disease in her other.    ROS:  Please see the history of present illness.   Otherwise, review of systems are positive for none.   All other systems are reviewed and negative.    PHYSICAL EXAM: VS:  BP 170/80 mmHg  Pulse 96  Ht 5\' 7"  (1.702 m)  Wt 193 lb (87.544 kg)  BMI 30.22 kg/m2  SpO2 92% , BMI Body mass index is 30.22 kg/(m^2). GEN: Well nourished, well developed, in no acute distress HEENT: normal Neck: no JVD, carotid bruits, or masses Cardiac: RRR; no murmurs, rubs, or gallops,no edema  Respiratory:  clear to auscultation bilaterally, normal work of breathing GI: soft, nontender, nondistended, + BS MS: no deformity or atrophy Skin: warm and dry, no rash Neuro:  Strength and sensation are intact Psych: euthymic mood,  full affect   EKG:  EKG is ordered today. The ekg ordered today demonstrates NSR with inferior and anterior infarct   Recent Labs: No results found for requested labs within last 365 days.    Lipid Panel    Component Value Date/Time   CHOL  08/25/2010 0404    194        ATP III CLASSIFICATION:  <200     mg/dL   Desirable  200-239  mg/dL   Borderline High  >=240    mg/dL   High          TRIG 175* 08/25/2010 0404   HDL 38* 08/25/2010 0404   CHOLHDL 5.1 08/25/2010 0404   VLDL 35 08/25/2010 0404   LDLCALC * 08/25/2010 0404    121        Total Cholesterol/HDL:CHD Risk Coronary Heart Disease Risk Table                     Men   Women  1/2 Average Risk   3.4   3.3  Average Risk       5.0   4.4  2 X Average Risk   9.6   7.1  3 X Average Risk  23.4   11.0        Use the calculated Patient Ratio above and the CHD Risk Table to determine the patient's CHD Risk.        ATP III CLASSIFICATION (LDL):  <100     mg/dL   Optimal  100-129  mg/dL   Near or Above                    Optimal  130-159  mg/dL   Borderline  160-189  mg/dL   High  >190     mg/dL   Very High      Wt Readings from Last 3 Encounters:  12/25/15 193 lb (87.544 kg)  02/20/14 186 lb (84.369 kg)  12/20/12 184 lb (83.462 kg)       ASSESSMENT AND PLAN:  1.  PVC's mainly asymptomatic and controlled on Cardizem. 2.  DOE with EKG showing inferior and anterior infarct with no ST changes of ischemia. I suspect that SOB is due to obesity.   I will get a stress myoview to rule out ischemia.  I will also check a 2D echo to assess LVF.   3.  Poorly controlled HTN -  She says it runs 130/67mmHg at home.  Continue ACE I, HCTZ and Cardizem.  I have asked her to check  her BP daily for a week and call with results.     Current medicines are reviewed at length with the patient today.  The patient does not have concerns regarding medicines.  The following changes have been made:  no change  Labs/ tests ordered today:  See above Assessment and Plan No orders of the defined types were placed in this encounter.     Disposition:   FU with me in 1 year  Signed, Sueanne Margarita, MD  12/25/2015 9:10 AM    [facility] [address], [place], [place]  [address] Phone: [phone]; Fax: [phone]

## 2015-12-25 ENCOUNTER — Encounter: Payer: Self-pay | Admitting: Cardiology

## 2015-12-25 ENCOUNTER — Ambulatory Visit (INDEPENDENT_AMBULATORY_CARE_PROVIDER_SITE_OTHER): Payer: Medicare Other | Admitting: Cardiology

## 2015-12-25 VITALS — BP 170/80 | HR 96 | Ht 67.0 in | Wt 193.0 lb

## 2015-12-25 DIAGNOSIS — I493 Ventricular premature depolarization: Secondary | ICD-10-CM

## 2015-12-25 DIAGNOSIS — R0602 Shortness of breath: Secondary | ICD-10-CM

## 2015-12-25 DIAGNOSIS — I10 Essential (primary) hypertension: Secondary | ICD-10-CM

## 2015-12-25 NOTE — Patient Instructions (Signed)
Medication Instructions:  Your physician recommends that you continue on your current medications as directed. Please refer to the Current Medication list given to you today.   Labwork: None  Testing/Procedures: Your physician has requested that you have an echocardiogram. Echocardiography is a painless test that uses sound waves to create images of your heart. It provides your doctor with information about the size and shape of your heart and how well your heart's chambers and valves are working. This procedure takes approximately one hour. There are no restrictions for this procedure.  Dr. Radford Pax recommends you have a NUCLEAR STRESS TEST.  Follow-Up: Your physician wants you to follow-up in: 1 year with Dr. Radford Pax. You will receive a reminder letter in the mail two months in advance. If you don't receive a letter, please call our office to schedule the follow-up appointment.   Any Other Special Instructions Will Be Listed Below (If Applicable).     If you need a refill on your cardiac medications before your next appointment, please call your pharmacy.

## 2016-01-01 ENCOUNTER — Telehealth: Payer: Self-pay | Admitting: Cardiology

## 2016-01-01 NOTE — Telephone Encounter (Signed)
BP for the most part stable - no changes in meds

## 2016-01-01 NOTE — Telephone Encounter (Signed)
NewMessage  Pt reported BP readings for last week.   3/17-117/78 p 86 3/18-163/74 p 78 3/19-152/72 p 88 3/20-131/65 p 81 3/21- 143/68 p 88 3/22- 118/62 p 82 3/23- 154/74 p 92

## 2016-01-02 NOTE — Telephone Encounter (Signed)
Informed pt BP stable and no changes in meds per Dr. Radford Pax. Pt verbalized understanding.

## 2016-01-12 ENCOUNTER — Encounter: Payer: Self-pay | Admitting: Cardiology

## 2016-01-12 ENCOUNTER — Telehealth (HOSPITAL_COMMUNITY): Payer: Self-pay | Admitting: *Deleted

## 2016-01-12 NOTE — Telephone Encounter (Signed)
Left message on voicemail in reference to upcoming appointment scheduled for 01/14/16. Phone number given for a call back so details instructions can be given. Alice Cole, [place]e [place]

## 2016-01-13 ENCOUNTER — Telehealth (HOSPITAL_COMMUNITY): Payer: Self-pay | Admitting: *Deleted

## 2016-01-13 NOTE — Telephone Encounter (Signed)
Patient given detailed instructions per Myocardial Perfusion Study Information Sheet for the test on 12/14/15 at 10:00. Patient notified to arrive 15 minutes early and that it is imperative to arrive on time for appointment to keep from having the test rescheduled.  If you need to cancel or reschedule your appointment, please call the office within 24 hours of your appointment. Failure to do so may result in a cancellation of your appointment, and a $50 no show fee. Patient verbalized understanding.Alice Cole

## 2016-01-14 ENCOUNTER — Ambulatory Visit (HOSPITAL_COMMUNITY): Payer: Medicare Other | Attending: Internal Medicine

## 2016-01-14 ENCOUNTER — Other Ambulatory Visit: Payer: Self-pay

## 2016-01-14 ENCOUNTER — Ambulatory Visit (HOSPITAL_BASED_OUTPATIENT_CLINIC_OR_DEPARTMENT_OTHER): Payer: Medicare Other

## 2016-01-14 DIAGNOSIS — R0600 Dyspnea, unspecified: Secondary | ICD-10-CM | POA: Diagnosis present

## 2016-01-14 DIAGNOSIS — E785 Hyperlipidemia, unspecified: Secondary | ICD-10-CM | POA: Diagnosis not present

## 2016-01-14 DIAGNOSIS — R0602 Shortness of breath: Secondary | ICD-10-CM | POA: Diagnosis not present

## 2016-01-14 DIAGNOSIS — I119 Hypertensive heart disease without heart failure: Secondary | ICD-10-CM | POA: Insufficient documentation

## 2016-01-14 DIAGNOSIS — E119 Type 2 diabetes mellitus without complications: Secondary | ICD-10-CM | POA: Insufficient documentation

## 2016-01-14 DIAGNOSIS — Z8249 Family history of ischemic heart disease and other diseases of the circulatory system: Secondary | ICD-10-CM | POA: Insufficient documentation

## 2016-01-14 DIAGNOSIS — I059 Rheumatic mitral valve disease, unspecified: Secondary | ICD-10-CM | POA: Insufficient documentation

## 2016-01-14 DIAGNOSIS — G4733 Obstructive sleep apnea (adult) (pediatric): Secondary | ICD-10-CM | POA: Diagnosis not present

## 2016-01-14 LAB — MYOCARDIAL PERFUSION IMAGING
Estimated workload: 5.3 METS
Exercise duration (min): 4 min
Exercise duration (sec): 46 s
LV dias vol: 63 mL (ref 46–106)
LV sys vol: 16 mL
MPHR: 151 {beats}/min
Peak HR: 133 {beats}/min
Percent HR: 88 %
RATE: 0.29
RPE: 19
Rest HR: 80 {beats}/min
SDS: 0
SRS: 0
SSS: 0
TID: 1.18

## 2016-01-14 MED ORDER — TECHNETIUM TC 99M SESTAMIBI GENERIC - CARDIOLITE
33.0000 | Freq: Once | INTRAVENOUS | Status: AC | PRN
  Administered 2016-01-14: 33 via INTRAVENOUS

## 2016-01-14 MED ORDER — TECHNETIUM TC 99M SESTAMIBI GENERIC - CARDIOLITE
11.0000 | Freq: Once | INTRAVENOUS | Status: AC | PRN
  Administered 2016-01-14: 11 via INTRAVENOUS

## 2016-01-19 ENCOUNTER — Telehealth: Payer: Self-pay

## 2016-01-19 DIAGNOSIS — R0602 Shortness of breath: Secondary | ICD-10-CM

## 2016-01-19 NOTE — Telephone Encounter (Signed)
Informed patient of ECHO and myoview results and verbal understanding expressed.  BNP scheduled for tomorrow. Patient agrees with treatment plan.

## 2016-01-19 NOTE — Telephone Encounter (Signed)
Follow up      Retuning a call to the nurse to get test results

## 2016-01-19 NOTE — Telephone Encounter (Signed)
Sueanne Margarita, MD   Sent: Fri January 16, 2016 2:06 PM    To: Theodoro Parma, RN        Message     NOrmal stress test   Called patient to review myoview results. Left message to call back.

## 2016-01-19 NOTE — Telephone Encounter (Signed)
-----   Message from Sueanne Margarita, MD sent at 01/14/2016  2:06 PM EDT ----- Please have patient come in for a BNP due to SOB

## 2016-01-20 ENCOUNTER — Other Ambulatory Visit (INDEPENDENT_AMBULATORY_CARE_PROVIDER_SITE_OTHER): Payer: Medicare Other

## 2016-01-20 DIAGNOSIS — R0602 Shortness of breath: Secondary | ICD-10-CM | POA: Diagnosis not present

## 2016-01-20 LAB — BRAIN NATRIURETIC PEPTIDE: Brain Natriuretic Peptide: 50.4 pg/mL (ref <100)

## 2016-01-21 ENCOUNTER — Telehealth: Payer: Self-pay | Admitting: Cardiology

## 2016-01-21 NOTE — Telephone Encounter (Signed)
Pt given results, no questions at this time.

## 2016-01-21 NOTE — Telephone Encounter (Signed)
Follow Up: ° ° °Returning Katy's call from yesterday. °

## 2016-02-15 DIAGNOSIS — R300 Dysuria: Secondary | ICD-10-CM | POA: Diagnosis not present

## 2016-02-15 DIAGNOSIS — N390 Urinary tract infection, site not specified: Secondary | ICD-10-CM | POA: Diagnosis not present

## 2016-02-18 DIAGNOSIS — H35351 Cystoid macular degeneration, right eye: Secondary | ICD-10-CM | POA: Diagnosis not present

## 2016-02-18 DIAGNOSIS — Z961 Presence of intraocular lens: Secondary | ICD-10-CM | POA: Diagnosis not present

## 2016-02-18 DIAGNOSIS — H20021 Recurrent acute iridocyclitis, right eye: Secondary | ICD-10-CM | POA: Diagnosis not present

## 2016-02-18 DIAGNOSIS — E113213 Type 2 diabetes mellitus with mild nonproliferative diabetic retinopathy with macular edema, bilateral: Secondary | ICD-10-CM | POA: Diagnosis not present

## 2016-02-18 DIAGNOSIS — H2512 Age-related nuclear cataract, left eye: Secondary | ICD-10-CM | POA: Diagnosis not present

## 2016-02-20 ENCOUNTER — Encounter (HOSPITAL_COMMUNITY): Payer: Self-pay | Admitting: Emergency Medicine

## 2016-02-20 ENCOUNTER — Emergency Department (HOSPITAL_COMMUNITY): Payer: Medicare Other

## 2016-02-20 ENCOUNTER — Emergency Department (HOSPITAL_COMMUNITY)
Admission: EM | Admit: 2016-02-20 | Discharge: 2016-02-20 | Disposition: A | Discharge status: Home / Self Care | Payer: Medicare Other | Attending: Emergency Medicine | Admitting: Emergency Medicine

## 2016-02-20 DIAGNOSIS — S0990XA Unspecified injury of head, initial encounter: Secondary | ICD-10-CM | POA: Diagnosis not present

## 2016-02-20 DIAGNOSIS — G4733 Obstructive sleep apnea (adult) (pediatric): Secondary | ICD-10-CM | POA: Insufficient documentation

## 2016-02-20 DIAGNOSIS — T1490XA Injury, unspecified, initial encounter: Secondary | ICD-10-CM

## 2016-02-20 DIAGNOSIS — Z7982 Long term (current) use of aspirin: Secondary | ICD-10-CM | POA: Insufficient documentation

## 2016-02-20 DIAGNOSIS — E119 Type 2 diabetes mellitus without complications: Secondary | ICD-10-CM | POA: Insufficient documentation

## 2016-02-20 DIAGNOSIS — Z9981 Dependence on supplemental oxygen: Secondary | ICD-10-CM | POA: Insufficient documentation

## 2016-02-20 DIAGNOSIS — Z79899 Other long term (current) drug therapy: Secondary | ICD-10-CM | POA: Diagnosis not present

## 2016-02-20 DIAGNOSIS — Y9289 Other specified places as the place of occurrence of the external cause: Secondary | ICD-10-CM | POA: Insufficient documentation

## 2016-02-20 DIAGNOSIS — W228XXA Striking against or struck by other objects, initial encounter: Secondary | ICD-10-CM | POA: Insufficient documentation

## 2016-02-20 DIAGNOSIS — I10 Essential (primary) hypertension: Secondary | ICD-10-CM | POA: Diagnosis not present

## 2016-02-20 DIAGNOSIS — Z86010 Personal history of colonic polyps: Secondary | ICD-10-CM | POA: Insufficient documentation

## 2016-02-20 DIAGNOSIS — E785 Hyperlipidemia, unspecified: Secondary | ICD-10-CM | POA: Insufficient documentation

## 2016-02-20 DIAGNOSIS — S0083XA Contusion of other part of head, initial encounter: Secondary | ICD-10-CM | POA: Insufficient documentation

## 2016-02-20 DIAGNOSIS — Z7984 Long term (current) use of oral hypoglycemic drugs: Secondary | ICD-10-CM | POA: Diagnosis not present

## 2016-02-20 DIAGNOSIS — Z86718 Personal history of other venous thrombosis and embolism: Secondary | ICD-10-CM | POA: Insufficient documentation

## 2016-02-20 DIAGNOSIS — Y998 Other external cause status: Secondary | ICD-10-CM | POA: Insufficient documentation

## 2016-02-20 DIAGNOSIS — Y9389 Activity, other specified: Secondary | ICD-10-CM | POA: Insufficient documentation

## 2016-02-20 DIAGNOSIS — S098XXA Other specified injuries of head, initial encounter: Secondary | ICD-10-CM | POA: Diagnosis not present

## 2016-02-20 NOTE — ED Notes (Signed)
Neuro intact

## 2016-02-20 NOTE — ED Notes (Signed)
Patient transported to CT 

## 2016-02-20 NOTE — Discharge Instructions (Signed)
Head Injury, Adult °You have a head injury. Headaches and throwing up (vomiting) are common after a head injury. It should be easy to wake up from sleeping. Sometimes you must stay in the hospital. Most problems happen within the first 24 hours. Side effects may occur up to 7-10 days after the injury.  °WHAT ARE THE TYPES OF HEAD INJURIES? °Head injuries can be as minor as a bump. Some head injuries can be more severe. More severe head injuries include: °· A jarring injury to the brain (concussion). °· A bruise of the brain (contusion). This mean there is bleeding in the brain that can cause swelling. °· A cracked skull (skull fracture). °· Bleeding in the brain that collects, clots, and forms a bump (hematoma). °WHEN SHOULD I GET HELP RIGHT AWAY?  °· You are confused or sleepy. °· You cannot be woken up. °· You feel sick to your stomach (nauseous) or keep throwing up (vomiting). °· Your dizziness or unsteadiness is getting worse. °· You have very bad, lasting headaches that are not helped by medicine. Take medicines only as told by your doctor. °· You cannot use your arms or legs like normal. °· You cannot walk. °· You notice changes in the black spots in the center of the colored part of your eye (pupil). °· You have clear or bloody fluid coming from your nose or ears. °· You have trouble seeing. °During the next 24 hours after the injury, you must stay with someone who can watch you. This person should get help right away (call 911 in the [place] if you start to shake and are not able to control it (have seizures), you pass out, or you are unable to wake up. °HOW CAN I PREVENT A HEAD INJURY IN THE FUTURE? °· Wear seat belts. °· Wear a helmet while bike riding and playing sports like football. °· Stay away from dangerous activities around the house. °WHEN CAN I RETURN TO NORMAL ACTIVITIES AND ATHLETICS? °See your doctor before doing these activities. You should not do normal activities or play contact sports until 1  week after the following symptoms have stopped: °· Headache that does not go away. °· Dizziness. °· Poor attention. °· Confusion. °· Memory problems. °· Sickness to your stomach or throwing up. °· Tiredness. °· Fussiness. °· Bothered by bright lights or loud noises. °· Anxiousness or depression. °· Restless sleep. °MAKE SURE YOU:  °· Understand these instructions. °· Will watch your condition. °· Will get help right away if you are not doing well or get worse. °  °This information is not intended to replace advice given to you by your health care provider. Make sure you discuss any questions you have with your health care provider. °  °Document Released: 09/09/2008 Document Revised: 10/18/2014 Document Reviewed: 06/04/2013 °Elsevier Interactive Patient Education ©2016 [employer] ° °

## 2016-02-20 NOTE — ED Provider Notes (Signed)
CSN: [id number]     Arrival date & time 02/20/16  0758 History   First MD Initiated Contact with Patient 02/20/16 [phone]     Chief Complaint  Patient presents with  . Head Injury     Patient is a 70 y.o. female presenting with head injury. The history is provided by the patient.  Head Injury Associated symptoms: headache   Associated symptoms: no nausea, no neck pain and no vomiting   Patient was doing some yard work yesterday when a branch fell down here in the head. No loss conscious. Slight pain that time pain was more severe this morning. No numbness weakness. No nausea vomiting. States she thought she needed to be seen since pain improved. She is on aspirin. No other correlation. No vision changes. No neck pain. No numbness or weakness.  Past Medical History  Diagnosis Date  . Hypertension   . Hyperlipidemia   . Diabetes mellitus   . Dermatitis   . Diverticulitis     recurrent  . Adenomatous colon polyp   . Obstructive sleep apnea on CPAP     pt does not use  . Heart palpitations   . PVC's (premature ventricular contractions)   . Esophageal stricture   . Hemorrhoids   . Colonic polyp   . Bigeminy   . Chest pain   . DVT (deep venous thrombosis) ([place])   . Abnormal liver function   . GERD (gastroesophageal reflux disease)    Past Surgical History  Procedure Laterality Date  . Cholecystectomy      aprox 10 years ago  . Abdominal hysterectomy    . Knee surgery      Right  . Knee surgery      Left   Family History  Problem Relation Age of Onset  . Heart attack Mother 57  . Heart attack Brother 29  . Heart attack Maternal Grandmother 67  . Heart attack Maternal Grandfather 63  . Cancer Daughter     Ovarian Cancer  . Diabetes Other     Grandparent  . Heart disease Other     Grandparent   Social History  Substance Use Topics  . Smoking status: Never Smoker   . Smokeless tobacco: None  . Alcohol Use: No     Comment: used to drink 30 yrs ago   OB History     No data available     Review of Systems  Constitutional: Negative for appetite change.  Eyes: Negative for visual disturbance.  Respiratory: Negative for shortness of breath.   Gastrointestinal: Negative for nausea, vomiting and abdominal pain.  Musculoskeletal: Negative for back pain and neck pain.  Skin: Positive for wound.  Neurological: Positive for headaches.      Allergies  Metoprolol; Nortriptyline; Olmesartan; Tradjenta; Alatrofloxacin; Benicar; Beta adrenergic blockers; Cartia xt; Cephalexin; Codeine; Colesevelam; Crestor; Flagyl; Fluvastatin sodium; Levemir; Lisinopril; Metoprolol succinate; Nortriptyline hcl; Pamelor; Rosuvastatin; Sulfamethoxazole-trimethoprim; Verapamil; Welchol; Adhesive; Sitagliptin; and Trovan  Home Medications   Prior to Admission medications   Medication Sig Start Date End Date Taking? Authorizing Provider  aspirin 325 MG tablet Take 325 mg by mouth daily.    Historical Provider, MD  atorvastatin (LIPITOR) 40 MG tablet Take 40 mg by mouth daily.    Historical Provider, MD  diltiazem (CARDIZEM CD) 240 MG 24 hr capsule Take 240 mg by mouth daily.    Historical Provider, MD  hydrochlorothiazide (HYDRODIURIL) 25 MG tablet Take 25 mg by mouth daily.    Historical Provider, MD  LANTUS SOLOSTAR 100 UNIT/ML Solostar Pen Take 80 Units by mouth daily. 12/14/15   Historical Provider, MD  metFORMIN (GLUCOPHAGE) 1000 MG tablet Take 1,000 mg by mouth 2 (two) times daily with a meal.    Historical Provider, MD  omeprazole (PRILOSEC) 20 MG capsule Take 20 mg by mouth daily.    Historical Provider, MD  ramipril (ALTACE) 10 MG capsule Take 10 mg by mouth 2 (two) times daily.    Historical Provider, MD   BP 135/72 mmHg  Pulse 87  Temp(Src) 98.5 F (36.9 C) (Oral)  Resp 16  Wt 190 lb 11.2 oz (86.501 kg)  SpO2 97% Physical Exam  Constitutional: She appears well-developed and well-nourished.  HENT:  Abrasion/ecchymosis to left parietal area. Mild tenderness.   Eyes: EOM are normal. Pupils are equal, round, and reactive to light.  Neck: Normal range of motion.  Pulmonary/Chest: Effort normal.  Abdominal: Soft.  Musculoskeletal:  No cervical spine tenderness.  Neurological: She is alert.  Skin: Skin is warm.    ED Course  Procedures (including critical care time) Labs Review Labs Reviewed - No data to display  Imaging Review Ct Head Wo Contrast  02/20/2016  CLINICAL DATA:  Struck in the head last night by tree branch. Trauma. EXAM: CT HEAD WITHOUT CONTRAST TECHNIQUE: Contiguous axial images were obtained from the base of the skull through the vertex without contrast. COMPARISON:  07/26/2012 FINDINGS: Stable low-density area along the inferior aspect of the right basal ganglia could represent a dilated perivascular space and unchanged. There are basal ganglia calcifications. No evidence for acute hemorrhage, mass lesion, midline shift, hydrocephalus or large infarct. Probable polyp in the left maxillary sinus. Otherwise, the visualized paranasal sinuses are clear. Small amount of fluid in the bilateral mastoid air cells. No calvarial fracture. IMPRESSION: No acute intracranial abnormality. Probable left maxillary sinus polyp. Small amount of fluid in the mastoid air cells. Electronically Signed   By: Markus Daft M.D.   On: 02/20/2016 09:27   I have personally reviewed and evaluated these images and lab results as part of my medical decision-making.   EKG Interpretation None      MDM   Final diagnoses:  Minor head injury, initial encounter    Patient hit in her head. Negative head CT. Benign exam. Discharge home.    Davonna Belling, MD 02/20/16 [phone]

## 2016-02-20 NOTE — ED Notes (Signed)
Pt was hit in head with tree branch last night; no LOC or nausea. States just sore. Not on blood thinners. Pupils non reactive but has multiple eye diseases.

## 2016-02-23 DIAGNOSIS — E113311 Type 2 diabetes mellitus with moderate nonproliferative diabetic retinopathy with macular edema, right eye: Secondary | ICD-10-CM | POA: Diagnosis not present

## 2016-02-23 DIAGNOSIS — E113312 Type 2 diabetes mellitus with moderate nonproliferative diabetic retinopathy with macular edema, left eye: Secondary | ICD-10-CM | POA: Diagnosis not present

## 2016-02-23 DIAGNOSIS — H20011 Primary iridocyclitis, right eye: Secondary | ICD-10-CM | POA: Diagnosis not present

## 2016-02-23 DIAGNOSIS — Z961 Presence of intraocular lens: Secondary | ICD-10-CM | POA: Diagnosis not present

## 2016-02-23 DIAGNOSIS — H44111 Panuveitis, right eye: Secondary | ICD-10-CM | POA: Diagnosis not present

## 2016-02-23 DIAGNOSIS — H35351 Cystoid macular degeneration, right eye: Secondary | ICD-10-CM | POA: Diagnosis not present

## 2016-02-23 DIAGNOSIS — H2512 Age-related nuclear cataract, left eye: Secondary | ICD-10-CM | POA: Diagnosis not present

## 2016-03-01 DIAGNOSIS — K5900 Constipation, unspecified: Secondary | ICD-10-CM | POA: Diagnosis not present

## 2016-03-03 ENCOUNTER — Other Ambulatory Visit: Payer: Self-pay | Admitting: Family Medicine

## 2016-03-03 DIAGNOSIS — R102 Pelvic and perineal pain: Secondary | ICD-10-CM

## 2016-03-09 ENCOUNTER — Ambulatory Visit
Admission: RE | Admit: 2016-03-09 | Discharge: 2016-03-09 | Disposition: A | Discharge status: Home / Self Care | Payer: Medicare Other | Source: Ambulatory Visit | Attending: Family Medicine | Admitting: Family Medicine

## 2016-03-09 ENCOUNTER — Ambulatory Visit
Admission: RE | Admit: 2016-03-09 | Discharge: 2016-03-09 | Disposition: A | Discharge status: Home / Self Care | Payer: No Typology Code available for payment source | Source: Ambulatory Visit | Attending: Family Medicine | Admitting: Family Medicine

## 2016-03-09 DIAGNOSIS — R102 Pelvic and perineal pain: Secondary | ICD-10-CM

## 2016-03-22 DIAGNOSIS — H44111 Panuveitis, right eye: Secondary | ICD-10-CM | POA: Diagnosis not present

## 2016-03-22 DIAGNOSIS — E113311 Type 2 diabetes mellitus with moderate nonproliferative diabetic retinopathy with macular edema, right eye: Secondary | ICD-10-CM | POA: Diagnosis not present

## 2016-03-22 DIAGNOSIS — H35351 Cystoid macular degeneration, right eye: Secondary | ICD-10-CM | POA: Diagnosis not present

## 2016-03-22 DIAGNOSIS — H20011 Primary iridocyclitis, right eye: Secondary | ICD-10-CM | POA: Diagnosis not present

## 2016-03-24 DIAGNOSIS — M8580 Other specified disorders of bone density and structure, unspecified site: Secondary | ICD-10-CM | POA: Diagnosis not present

## 2016-03-24 DIAGNOSIS — Z794 Long term (current) use of insulin: Secondary | ICD-10-CM | POA: Diagnosis not present

## 2016-03-24 DIAGNOSIS — E1165 Type 2 diabetes mellitus with hyperglycemia: Secondary | ICD-10-CM | POA: Diagnosis not present

## 2016-03-24 DIAGNOSIS — Z5181 Encounter for therapeutic drug level monitoring: Secondary | ICD-10-CM | POA: Diagnosis not present

## 2016-03-24 DIAGNOSIS — R197 Diarrhea, unspecified: Secondary | ICD-10-CM | POA: Diagnosis not present

## 2016-04-06 DIAGNOSIS — K5732 Diverticulitis of large intestine without perforation or abscess without bleeding: Secondary | ICD-10-CM | POA: Diagnosis not present

## 2016-05-03 DIAGNOSIS — K5730 Diverticulosis of large intestine without perforation or abscess without bleeding: Secondary | ICD-10-CM | POA: Diagnosis not present

## 2016-05-24 DIAGNOSIS — H35372 Puckering of macula, left eye: Secondary | ICD-10-CM | POA: Diagnosis not present

## 2016-05-24 DIAGNOSIS — H40001 Preglaucoma, unspecified, right eye: Secondary | ICD-10-CM | POA: Diagnosis not present

## 2016-05-24 DIAGNOSIS — Z961 Presence of intraocular lens: Secondary | ICD-10-CM | POA: Diagnosis not present

## 2016-05-24 DIAGNOSIS — H2512 Age-related nuclear cataract, left eye: Secondary | ICD-10-CM | POA: Diagnosis not present

## 2016-05-24 DIAGNOSIS — E113311 Type 2 diabetes mellitus with moderate nonproliferative diabetic retinopathy with macular edema, right eye: Secondary | ICD-10-CM | POA: Diagnosis not present

## 2016-05-24 DIAGNOSIS — E113312 Type 2 diabetes mellitus with moderate nonproliferative diabetic retinopathy with macular edema, left eye: Secondary | ICD-10-CM | POA: Diagnosis not present

## 2016-05-24 DIAGNOSIS — H35351 Cystoid macular degeneration, right eye: Secondary | ICD-10-CM | POA: Diagnosis not present

## 2016-05-24 DIAGNOSIS — H44111 Panuveitis, right eye: Secondary | ICD-10-CM | POA: Diagnosis not present

## 2016-05-24 DIAGNOSIS — H20011 Primary iridocyclitis, right eye: Secondary | ICD-10-CM | POA: Diagnosis not present

## 2016-06-07 DIAGNOSIS — R002 Palpitations: Secondary | ICD-10-CM | POA: Diagnosis not present

## 2016-06-07 DIAGNOSIS — M7062 Trochanteric bursitis, left hip: Secondary | ICD-10-CM | POA: Diagnosis not present

## 2016-07-01 DIAGNOSIS — M7062 Trochanteric bursitis, left hip: Secondary | ICD-10-CM | POA: Diagnosis not present

## 2016-07-08 DIAGNOSIS — M8580 Other specified disorders of bone density and structure, unspecified site: Secondary | ICD-10-CM | POA: Diagnosis not present

## 2016-07-08 DIAGNOSIS — Z794 Long term (current) use of insulin: Secondary | ICD-10-CM | POA: Diagnosis not present

## 2016-07-08 DIAGNOSIS — E1165 Type 2 diabetes mellitus with hyperglycemia: Secondary | ICD-10-CM | POA: Diagnosis not present

## 2016-07-08 DIAGNOSIS — Z23 Encounter for immunization: Secondary | ICD-10-CM | POA: Diagnosis not present

## 2016-07-08 DIAGNOSIS — Z5181 Encounter for therapeutic drug level monitoring: Secondary | ICD-10-CM | POA: Diagnosis not present

## 2016-07-09 ENCOUNTER — Encounter (INDEPENDENT_AMBULATORY_CARE_PROVIDER_SITE_OTHER): Payer: Self-pay

## 2016-07-09 ENCOUNTER — Ambulatory Visit (INDEPENDENT_AMBULATORY_CARE_PROVIDER_SITE_OTHER): Payer: Medicare Other | Admitting: Cardiology

## 2016-07-09 ENCOUNTER — Encounter: Payer: Self-pay | Admitting: Cardiology

## 2016-07-09 VITALS — BP 140/82 | HR 84 | Ht 67.0 in | Wt 192.4 lb

## 2016-07-09 DIAGNOSIS — I10 Essential (primary) hypertension: Secondary | ICD-10-CM | POA: Diagnosis not present

## 2016-07-09 DIAGNOSIS — R079 Chest pain, unspecified: Secondary | ICD-10-CM

## 2016-07-09 DIAGNOSIS — I493 Ventricular premature depolarization: Secondary | ICD-10-CM | POA: Diagnosis not present

## 2016-07-09 DIAGNOSIS — R9431 Abnormal electrocardiogram [ECG] [EKG]: Secondary | ICD-10-CM

## 2016-07-09 DIAGNOSIS — R002 Palpitations: Secondary | ICD-10-CM

## 2016-07-09 DIAGNOSIS — G4733 Obstructive sleep apnea (adult) (pediatric): Secondary | ICD-10-CM

## 2016-07-09 HISTORY — DX: Obstructive sleep apnea (adult) (pediatric): G47.33

## 2016-07-09 HISTORY — DX: Abnormal electrocardiogram (ECG) (EKG): R94.31

## 2016-07-09 NOTE — Progress Notes (Signed)
Cardiology Office Note    Date:  07/09/2016   ID:  Alice, Cole 08-13-1946, MRN [id number]  PCP:  Jonathon Bellows, MD  Cardiologist:  Fransico Him, MD   Chief Complaint  Patient presents with  . Sleep Apnea  . Hypertension  . Chest Pain    History of Present Illness:  Alice Cole is a 70 y.o. female who presents for followup of OSA/PVCs and HTN.  She has a history of OSA but does not use CPAP on a regular basis.  She has a history of PVCs and normal coronary arteries by cath in 2003.  When I last saw her she was having problems with SOB and a nuclear stress test showed no ischemia and 2D echo showed normal LVF with diastolic dysfunction. BNP was normal.   She presents back for followup today.   She saw her PCP recently and EKG was abnormal with anterior infarct and her PCP recommended that she see me.  She says that she notices palpitations intermittently but recently has not been bothered by them.  At the time of her OV with her PCP the palpitations were more frequent.  She had been on vacation and thinks she may have been stressed out when she was on vacation.  She denies any chest pain or pressure.  She denies any dizziness, LE edema or syncope.  She has mild DOE when vacuming her house.     Past Medical History:  Diagnosis Date  . Abnormal EKG 07/09/2016  . Abnormal liver function   . Adenomatous colon polyp   . Bigeminy   . Chest pain   . Colonic polyp   . Dermatitis   . Diabetes mellitus   . Diverticulitis    recurrent  . DVT (deep venous thrombosis) ([place])   . Esophageal stricture   . GERD (gastroesophageal reflux disease)   . Heart palpitations   . Hemorrhoids   . Hyperlipidemia   . Hypertension   . Obstructive sleep apnea on CPAP    pt does not use  . OSA (obstructive sleep apnea) 07/09/2016   She has not been using her CPAP device because she needs new supplies  . PVC's (premature ventricular contractions)     Past Surgical History:  Procedure Laterality  Date  . ABDOMINAL HYSTERECTOMY    . CHOLECYSTECTOMY     aprox 10 years ago  . KNEE SURGERY     Right  . KNEE SURGERY     Left    Current Medications: Outpatient Medications Prior to Visit  Medication Sig Dispense Refill  . aspirin 325 MG tablet Take 325 mg by mouth daily.    Marland Kitchen atorvastatin (LIPITOR) 40 MG tablet Take 40 mg by mouth daily.    Marland Kitchen diltiazem (CARDIZEM CD) 240 MG 24 hr capsule Take 240 mg by mouth daily.    . hydrochlorothiazide (HYDRODIURIL) 25 MG tablet Take 25 mg by mouth daily.    Marland Kitchen LANTUS SOLOSTAR 100 UNIT/ML Solostar Pen Take 80 Units by mouth daily.  3  . metFORMIN (GLUCOPHAGE) 1000 MG tablet Take 1,000 mg by mouth 2 (two) times daily with a meal.    . omeprazole (PRILOSEC) 20 MG capsule Take 20 mg by mouth daily.    . ramipril (ALTACE) 10 MG capsule Take 10 mg by mouth 2 (two) times daily.     No facility-administered medications prior to visit.      Allergies:   Metoprolol; Nortriptyline; Olmesartan; Tradjenta [linagliptin]; Alatrofloxacin; Benicar [olmesartan  medoxomil]; Beta adrenergic blockers; Cartia xt [diltiazem]; Cephalexin; Codeine; Colesevelam; Crestor [rosuvastatin calcium]; Flagyl [metronidazole]; Fluvastatin sodium; Levemir [insulin detemir]; Lisinopril; Metoprolol succinate; Nortriptyline hcl; Pamelor [nortriptyline hcl]; Rosuvastatin; Sulfamethoxazole-trimethoprim; Verapamil; Welchol [colesevelam hcl]; Adhesive [tape]; Sitagliptin; and Trovan [trovafloxacin]   Social History   Social History  . Marital status: Married    Spouse name: N/A  . Number of children: 2  . Years of education: 11   Occupational History  . Retired    Social History Main Topics  . Smoking status: Never Smoker  . Smokeless tobacco: None  . Alcohol use No     Comment: used to drink 30 yrs ago  . Drug use: No  . Sexual activity: Not Asked   Other Topics Concern  . None   Social History Narrative   Regular exercise-yes   Caffeine use-yes   Lives with Husband and  3 graqdnsons and one is adopted   Husband drinks a lot-her husband drinks and smokes a lot   Used to be be married and is currently divorced   Daughters are on drugs     Family History:  The patient's family history includes Cancer in her daughter; Diabetes in her other; Heart attack (age of onset: 49) in her mother; Heart attack (age of onset: 30) in her maternal grandfather; Heart attack (age of onset: 47) in her maternal grandmother; Heart attack (age of onset: 21) in her brother; Heart disease in her other.   ROS:   Please see the history of present illness.    ROS All other systems reviewed and are negative.  No flowsheet data found.     PHYSICAL EXAM:   VS:  BP 140/82 (BP Location: Right Arm, Patient Position: Sitting, Cuff Size: Normal)   Pulse 84   Ht 5\' 7"  (1.702 m)   Wt 192 lb 6.4 oz (87.3 kg)   BMI 30.13 kg/m    GEN: Well nourished, well developed, in no acute distress  HEENT: normal  Neck: no JVD, carotid bruits, or masses Cardiac: RRR; no murmurs, rubs, or gallops,no edema.  Intact distal pulses bilaterally.  Respiratory:  clear to auscultation bilaterally, normal work of breathing GI: soft, nontender, nondistended, + BS MS: no deformity or atrophy  Skin: warm and dry, no rash Neuro:  Alert and Oriented x 3, Strength and sensation are intact Psych: euthymic mood, full affect  Wt Readings from Last 3 Encounters:  07/09/16 192 lb 6.4 oz (87.3 kg)  02/20/16 190 lb 11.2 oz (86.5 kg)  01/14/16 193 lb (87.5 kg)      Studies/Labs Reviewed:   EKG:  EKG is not ordered today.    Recent Labs: 01/20/2016: Brain Natriuretic Peptide 50.4   Lipid Panel    Component Value Date/Time   CHOL  08/25/2010 0404    194        ATP III CLASSIFICATION:  <200     mg/dL   Desirable  200-239  mg/dL   Borderline High  >=240    mg/dL   High          TRIG 175 (H) 08/25/2010 0404   HDL 38 (L) 08/25/2010 0404   CHOLHDL 5.1 08/25/2010 0404   VLDL 35 08/25/2010 0404   LDLCALC  (H) 08/25/2010 0404    121        Total Cholesterol/HDL:CHD Risk Coronary Heart Disease Risk Table                     Men  Women  1/2 Average Risk   3.4   3.3  Average Risk       5.0   4.4  2 X Average Risk   9.6   7.1  3 X Average Risk  23.4   11.0        Use the calculated Patient Ratio above and the CHD Risk Table to determine the patient's CHD Risk.        ATP III CLASSIFICATION (LDL):  <100     mg/dL   Optimal  100-129  mg/dL   Near or Above                    Optimal  130-159  mg/dL   Borderline  160-189  mg/dL   High  >190     mg/dL   Very High    Additional studies/ records that were reviewed today include:  OV notes from PCP    ASSESSMENT:    1. Premature ventricular contraction   2. Essential hypertension, benign   3. OSA (obstructive sleep apnea)   4. Chest pain, unspecified chest pain type   5. Abnormal EKG   6. Heart palpitations      PLAN:  In order of problems listed above:  1. PVC's - for most part these are well tolerated. 2. HTN - BP controlled on current meds.  Her BP goal is < 150/38mmHg.  Continue CCB/ACE I and diuretic.  3. OSA - she would like to get back to using her CPAP device but needs supplies.  I will check with her DME to see if she can still get supplies since she has been noncompliant or if we have to start over with sleep study.   4. Chest pain with normal echo and stress test - this has resolved.  5. Abnormal EKG with old anterior infarct - this has been noted on EKGs as far back as 2014 with no ischemia or infarct on stress test and echo this year. 6. Palpitations that have been more frequent than when I saw her last. These are likely PVCs but will get a 30 day heart monitor to rule out PAF.    Medication Adjustments/Labs and Tests Ordered: Current medicines are reviewed at length with the patient today.  Concerns regarding medicines are outlined above.  Medication changes, Labs and Tests ordered today are listed in the Patient  Instructions below.  There are no Patient Instructions on file for this visit.   Signed, Fransico Him, MD  07/09/2016 10:53 AM    [facility] [place], [place], [place]  [address] Phone: [phone]; Fax: [phone]

## 2016-07-09 NOTE — Patient Instructions (Addendum)
Medication Instructions:  Your physician recommends that you continue on your current medications as directed. Please refer to the Current Medication list given to you today.   Labwork: None   Testing/Procedures: Your physician has recommended that you wear an event monitor. Event monitors are medical devices that record the heart's electrical activity. Doctors most often [place] these monitors to diagnose arrhythmias. Arrhythmias are problems with the speed or rhythm of the heartbeat. The monitor is a small, portable device. You can wear one while you do your normal daily activities. This is usually used to diagnose what is causing palpitations/syncope (passing out). 52 DAY   Follow-Up: Your physician wants you to follow-up in: 1 year with Dr Radford Pax. (September 2018).  You will receive a reminder letter in the mail two months in advance. If you don't receive a letter, please call our office to schedule the follow-up appointment.       If you need a refill on your cardiac medications before your next appointment, please call your pharmacy.

## 2016-07-13 DIAGNOSIS — M7062 Trochanteric bursitis, left hip: Secondary | ICD-10-CM | POA: Diagnosis not present

## 2016-07-14 ENCOUNTER — Ambulatory Visit (INDEPENDENT_AMBULATORY_CARE_PROVIDER_SITE_OTHER): Payer: Medicare Other

## 2016-07-14 ENCOUNTER — Other Ambulatory Visit: Payer: Self-pay | Admitting: Cardiology

## 2016-07-14 DIAGNOSIS — R9431 Abnormal electrocardiogram [ECG] [EKG]: Secondary | ICD-10-CM

## 2016-07-14 DIAGNOSIS — I493 Ventricular premature depolarization: Secondary | ICD-10-CM

## 2016-07-14 DIAGNOSIS — R079 Chest pain, unspecified: Secondary | ICD-10-CM

## 2016-07-14 DIAGNOSIS — R002 Palpitations: Secondary | ICD-10-CM | POA: Diagnosis not present

## 2016-07-14 DIAGNOSIS — I10 Essential (primary) hypertension: Secondary | ICD-10-CM

## 2016-07-14 DIAGNOSIS — G4733 Obstructive sleep apnea (adult) (pediatric): Secondary | ICD-10-CM

## 2016-07-16 DIAGNOSIS — M7062 Trochanteric bursitis, left hip: Secondary | ICD-10-CM | POA: Diagnosis not present

## 2016-07-16 NOTE — Addendum Note (Signed)
Addended by: Katina Dung on: 07/16/2016 09:40 AM   Modules accepted: Orders

## 2016-07-19 DIAGNOSIS — M7062 Trochanteric bursitis, left hip: Secondary | ICD-10-CM | POA: Diagnosis not present

## 2016-07-20 ENCOUNTER — Encounter: Payer: Self-pay | Admitting: Cardiology

## 2016-07-21 ENCOUNTER — Encounter: Payer: Self-pay | Admitting: Cardiology

## 2016-07-29 DIAGNOSIS — M65841 Other synovitis and tenosynovitis, right hand: Secondary | ICD-10-CM | POA: Diagnosis not present

## 2016-08-09 DIAGNOSIS — I10 Essential (primary) hypertension: Secondary | ICD-10-CM | POA: Diagnosis not present

## 2016-08-09 DIAGNOSIS — K047 Periapical abscess without sinus: Secondary | ICD-10-CM | POA: Diagnosis not present

## 2016-08-11 ENCOUNTER — Emergency Department (HOSPITAL_COMMUNITY)
Admission: EM | Admit: 2016-08-11 | Discharge: 2016-08-11 | Disposition: A | Discharge status: Home / Self Care | Payer: Medicare Other | Attending: Dermatology | Admitting: Dermatology

## 2016-08-11 ENCOUNTER — Encounter (HOSPITAL_COMMUNITY): Payer: Self-pay | Admitting: *Deleted

## 2016-08-11 DIAGNOSIS — Z7982 Long term (current) use of aspirin: Secondary | ICD-10-CM | POA: Insufficient documentation

## 2016-08-11 DIAGNOSIS — T7840XA Allergy, unspecified, initial encounter: Secondary | ICD-10-CM | POA: Insufficient documentation

## 2016-08-11 DIAGNOSIS — E119 Type 2 diabetes mellitus without complications: Secondary | ICD-10-CM | POA: Diagnosis not present

## 2016-08-11 DIAGNOSIS — Z7984 Long term (current) use of oral hypoglycemic drugs: Secondary | ICD-10-CM | POA: Diagnosis not present

## 2016-08-11 DIAGNOSIS — Z5321 Procedure and treatment not carried out due to patient leaving prior to being seen by health care provider: Secondary | ICD-10-CM | POA: Diagnosis not present

## 2016-08-11 DIAGNOSIS — I10 Essential (primary) hypertension: Secondary | ICD-10-CM | POA: Insufficient documentation

## 2016-08-11 DIAGNOSIS — Z794 Long term (current) use of insulin: Secondary | ICD-10-CM | POA: Insufficient documentation

## 2016-08-11 NOTE — ED Triage Notes (Signed)
The pt was given penicillin 2 days ago for an infected tooth.  She had 2 doses yesterday and 2 doses tod ay and she just does not feel good she is itching since 0500am today  She has had more itching after she took the 1400 pill    She has not taken any benadryl  No rash

## 2016-08-11 NOTE — ED Notes (Signed)
The pt thinks she might be allergic to benadryl

## 2016-08-11 NOTE — ED Notes (Signed)
Pts name called for a room no answer 

## 2016-08-11 NOTE — ED Triage Notes (Signed)
No difficulty breathing

## 2016-08-26 ENCOUNTER — Other Ambulatory Visit: Payer: Self-pay | Admitting: Family Medicine

## 2016-08-26 DIAGNOSIS — Z1231 Encounter for screening mammogram for malignant neoplasm of breast: Secondary | ICD-10-CM

## 2016-08-31 DIAGNOSIS — M7062 Trochanteric bursitis, left hip: Secondary | ICD-10-CM | POA: Diagnosis not present

## 2016-10-07 ENCOUNTER — Ambulatory Visit: Payer: Medicare Other

## 2016-10-08 DIAGNOSIS — E1165 Type 2 diabetes mellitus with hyperglycemia: Secondary | ICD-10-CM | POA: Diagnosis not present

## 2016-10-08 DIAGNOSIS — D692 Other nonthrombocytopenic purpura: Secondary | ICD-10-CM | POA: Diagnosis not present

## 2016-10-08 DIAGNOSIS — M8580 Other specified disorders of bone density and structure, unspecified site: Secondary | ICD-10-CM | POA: Diagnosis not present

## 2016-10-08 DIAGNOSIS — Z5181 Encounter for therapeutic drug level monitoring: Secondary | ICD-10-CM | POA: Diagnosis not present

## 2016-10-08 DIAGNOSIS — Z794 Long term (current) use of insulin: Secondary | ICD-10-CM | POA: Diagnosis not present

## 2016-10-08 DIAGNOSIS — Z79899 Other long term (current) drug therapy: Secondary | ICD-10-CM | POA: Diagnosis not present

## 2016-10-13 ENCOUNTER — Ambulatory Visit
Admission: RE | Admit: 2016-10-13 | Discharge: 2016-10-13 | Disposition: A | Discharge status: Home / Self Care | Payer: Medicare Other | Source: Ambulatory Visit | Attending: Family Medicine | Admitting: Family Medicine

## 2016-10-13 DIAGNOSIS — Z1231 Encounter for screening mammogram for malignant neoplasm of breast: Secondary | ICD-10-CM

## 2016-11-01 ENCOUNTER — Telehealth: Payer: Self-pay | Admitting: Cardiology

## 2016-11-01 MED ORDER — DILTIAZEM HCL ER COATED BEADS 300 MG PO CP24: 300.0000 mg | ORAL_CAPSULE | Freq: Every day | ORAL | 11 refills | Status: DC

## 2016-11-01 NOTE — Telephone Encounter (Signed)
Patient reports irregular heart beat since last Thursday. She states it was "really bad" over the weekend, but they are "much better" today. Over the weekend, she had an episode that was accompanied by a sharp, quick pain in her chest that quickly went away. She took half a "nerve pill" and the palpitations improved. She also complains of intermittent mild SOB. She does not associate the SOB with the palpitations and she states she gets the SOB at rest and on exertion. BP today 154/77. HR 95. She is asymptomatic right now. She is taking her medications as instructed.  Scheduled patient for soonest available OV on 1/25 with K. Grandville Silos, [place] for evaluation. She understands she will be called prior to that time if Dr. Radford Pax has further instructions. She was grateful for assistance.

## 2016-11-01 NOTE — Telephone Encounter (Signed)
Heart monitor last fall showed PVCs and accelerated junctional rhythm.  She does not tolerate BB.  Please have her increase Cardizem to 300mg  daily and follow with PA at already scheduled appt.

## 2016-11-01 NOTE — Telephone Encounter (Signed)
Instructed patient to INCREASE CARDIZEM CD to 300 mg daily. She understands to keep OV Thursday. She was grateful for call.

## 2016-11-01 NOTE — Telephone Encounter (Signed)
Alice Cole is calling because she has been having irregular heart beat and heart rate has been going up . It will go up to a 100 something , then come down . Please call

## 2016-11-03 NOTE — Progress Notes (Signed)
Cardiology Office Note    Date:  11/04/2016   ID:  Loudell, Hirani [birth year], MRN [id number]  PCP:  Jonathon Bellows, MD  Cardiologist:  Fransico Him, MD / Dr. Rayann Heman  CC: palpitations Vonna Kotyk pain    History of Present Illness:  Alice Cole is a 71 y.o. female with a history of OSA (non complaint with CPAP), HTN and PVCs who presents to clinic for evaluation of worsening palpitations and chest pain.    She has a history of PVCs and normal coronary arteries by cath in 2003. She saw Dr Radford Pax in 12/2015 for problems with SOB and a nuclear stress test showed no ischemia and 2D echo showed normal LVF with diastolic dysfunction. BNP was normal.   She was seen in follow up in 06/2016 and doing well. There was a question of old inferior infarct on her ECG noted by PCP. This was felt to be chronic and she has had reassuring caths and nuc. She reported worsening palpitations and 30 day event monitor was ordered which showed PVCs and some accelerated junctional tachycardia. No afib.   Called the office on 11/01/16 for worsening palpitations. It was noted that she does not tolerate BBs and her diltizem was increased to 300mg  daily.   Today she presents to clinic for follow up. She did not try the 300 mg because she says she has not tolerated this in the past (she cant remember what the intolerance was). She has been having irregular heart rates and chest pressure. She just doesn't feel good. She called EMS last night because of palpitations, chest pressure and high BPs. EMS. ECG was stable and BP went down with time. The palpitations feel like skips and racing. No LE edema, orthopnea or PND. No dizziness or syncope. No blood in stool or urine.      Past Medical History:  Diagnosis Date  . Abnormal EKG 07/09/2016  . Abnormal liver function   . Adenomatous colon polyp   . Bigeminy   . Chest pain   . Colonic polyp   . Dermatitis   . Diabetes mellitus   . Diverticulitis    recurrent  . DVT (deep  venous thrombosis) ([place])   . Esophageal stricture   . GERD (gastroesophageal reflux disease)   . Heart palpitations   . Hemorrhoids   . Hyperlipidemia   . Hypertension   . Obstructive sleep apnea on CPAP    pt does not use  . OSA (obstructive sleep apnea) 07/09/2016   She has not been using her CPAP device because she needs new supplies  . PVC's (premature ventricular contractions)     Past Surgical History:  Procedure Laterality Date  . ABDOMINAL HYSTERECTOMY    . CHOLECYSTECTOMY     aprox 10 years ago  . KNEE SURGERY     Right  . KNEE SURGERY     Left    Current Medications: Outpatient Medications Prior to Visit  Medication Sig Dispense Refill  . aspirin 325 MG tablet Take 325 mg by mouth daily.    Marland Kitchen atorvastatin (LIPITOR) 40 MG tablet Take 40 mg by mouth daily.    . hydrochlorothiazide (HYDRODIURIL) 25 MG tablet Take 25 mg by mouth daily.    . metFORMIN (GLUCOPHAGE) 1000 MG tablet Take 2,000 mg by mouth daily with supper.     Marland Kitchen omeprazole (PRILOSEC) 20 MG capsule Take 20 mg by mouth daily.    . ramipril (ALTACE) 10 MG capsule Take 10 mg  by mouth 2 (two) times daily.    Marland Kitchen diltiazem (CARDIZEM CD) 300 MG 24 hr capsule Take 1 capsule (300 mg total) by mouth daily. 30 capsule 11  . LANTUS SOLOSTAR 100 UNIT/ML Solostar Pen Take 80 Units by mouth daily.  3   No facility-administered medications prior to visit.      Allergies:   Metoprolol; Nortriptyline; Olmesartan; Tradjenta [linagliptin]; Alatrofloxacin; Benicar [olmesartan medoxomil]; Beta adrenergic blockers; Cartia xt [diltiazem]; Cephalexin; Codeine; Colesevelam; Crestor [rosuvastatin calcium]; Flagyl [metronidazole]; Fluvastatin sodium; Levemir [insulin detemir]; Lisinopril; Metoprolol succinate; Nortriptyline hcl; Pamelor [nortriptyline hcl]; Rosuvastatin; Sulfamethoxazole-trimethoprim; Verapamil; Welchol [colesevelam hcl]; Adhesive [tape]; Sitagliptin; and Trovan [trovafloxacin]   Social History   Social History  .  Marital status: Married    Spouse name: N/A  . Number of children: 2  . Years of education: 11   Occupational History  . Retired    Social History Main Topics  . Smoking status: Never Smoker  . Smokeless tobacco: Never Used  . Alcohol use No     Comment: used to drink 30 yrs ago  . Drug use: No  . Sexual activity: Not Asked   Other Topics Concern  . None   Social History Narrative   Regular exercise-yes   Caffeine use-yes   Lives with Husband and 3 graqdnsons and one is adopted   Husband drinks a lot-her husband drinks and smokes a lot   Used to be be married and is currently divorced   Daughters are on drugs     Family History:  The patient's family history includes Cancer in her daughter; Diabetes in her other; Heart attack (age of onset: 33) in her mother; Heart attack (age of onset: 1) in her maternal grandfather; Heart attack (age of onset: 61) in her maternal grandmother; Heart attack (age of onset: 56) in her brother; Heart disease in her other.     ROS:   Please see the history of present illness.    ROS All other systems reviewed and are negative.   PHYSICAL EXAM:   VS:  BP (!) 155/68   Pulse 88   Ht 5\' 7"  (1.702 m)   Wt 193 lb 12.8 oz (87.9 kg)   BMI 30.35 kg/m    GEN: Well nourished, well developed, in no acute distress  HEENT: normal  Neck: no JVD, carotid bruits, or masses Cardiac: RRR; no murmurs, rubs, or gallops,no edema  Respiratory:  clear to auscultation bilaterally, normal work of breathing GI: soft, nontender, nondistended, + BS MS: no deformity or atrophy  Skin: warm and dry, no rash Neuro:  Alert and Oriented x 3, Strength and sensation are intact Psych: euthymic mood, full affect    Wt Readings from Last 3 Encounters:  11/04/16 193 lb 12.8 oz (87.9 kg)  08/11/16 196 lb 8 oz (89.1 kg)  07/09/16 192 lb 6.4 oz (87.3 kg)      Studies/Labs Reviewed:   EKG:  EKG is ordered today.  The ekg ordered today demonstrates NSR HR 84. Low  voltage QRS. Anterior q waves that are chronic  Recent Labs: 01/20/2016: Brain Natriuretic Peptide 50.4   Lipid Panel   Additional studies/ records that were reviewed today include:  Cardiac monitor 07/2016 Study Highlights   Normal Sinus Rhythm with heart rate 73-98bpm  Occasional PVCs and ventricular escape beats.  Occasional accelerated junction rhythm  Transient type I second degree AV block during sleep    Nuclear stress test 01/2016 Study Highlights    Nuclear stress EF: 74%.  Blood pressure demonstrated a hypertensive response to exercise.  There was no ST segment deviation noted during stress.  The study is normal.  This is a low risk study.  The left ventricular ejection fraction is hyperdynamic (>65%).      2D ECHO: 01/14/2016 LV EF: 65% -   70% Study Conclusions - Left ventricle: The cavity size was normal. There was mild   concentric hypertrophy. Systolic function was vigorous. The   estimated ejection fraction was in the range of 65% to 70%. Wall   motion was normal; there were no regional wall motion   abnormalities. Doppler parameters are consistent with abnormal   left ventricular relaxation (grade 1 diastolic dysfunction). - Aortic valve: Transvalvular velocity was within the normal range.   There was no stenosis. There was no regurgitation. - Mitral valve: Calcified annulus. Transvalvular velocity was   within the normal range. There was no evidence for stenosis.   There was no regurgitation. - Right ventricle: The cavity size was normal. Wall thickness was   normal. Systolic function was normal. - Tricuspid valve: There was no regurgitation. - Inferior vena cava: The vessel was normal in size. The   respirophasic diameter changes were in the normal range (= 50%),   consistent with normal central venous pressure. - Normal global longitudinal strain. -21.9%.   ASSESSMENT & PLAN:   Palpitations: she has known PVCs and accelerated  junctional tachycardia on recent monitor. Dr. Radford Pax advised increasing Cardizem to 300mg  daily but she did not do this. She has agreed to try the Cardizem 300mg  daily. She is intolerant to BBs.  PVCs: continue CCB  HTN: BP elevated today. She did not increase Cardizem to 300mg  as instructed. She will do this now.   OSA: followed by Dr. Radford Pax. She is not complaint.   Chest pressure: normal cath in 2003. Low risk nuc in 12/2015. Will do coronary CT to assess for obstructive CAD   Medication Adjustments/Labs and Tests Ordered: Current medicines are reviewed at length with the patient today.  Concerns regarding medicines are outlined above.  Medication changes, Labs and Tests ordered today are listed in the Patient Instructions below. Patient Instructions  Medic1.  ation Instructions:  Your physician has recommended you make the following change in your medication:  1.  INCREASE the Diltiazem to 300 mg taking 1 tablet daily  Labwork: None ordered  Testing/Procedures: Your physician has requested that you have cardiac CT. Cardiac computed tomography (CT) is a painless test that uses an x-ray machine to take clear, detailed pictures of your heart. For further information please visit [facility]uge[facility]iest[facility][email or web address]. Please follow instruction sheet as given.     Follow-Up: Your physician recommends that you schedule a follow-up appointment in: 1ST AVAILABLE WITH DR. Radford Pax   Any Other Special Instructions Will Be Listed Below (If Applicable).   Cardiac CT Angiogram A cardiac CT angiogram is a test to help your health care provider find out why you are having chest pains or other symptoms of heart disease. The test uses an advanced type of X-ray machine that scans your heart and the area around the heart and creates multiple pictures of it. Other names for the test are coronary CT angiography, coronary artery scanning, and CTA.  The test is painless and fairly quick. It is noninvasive. That means it  does not involve any type of surgery or cuts (incisions). Instead, a fluid called contrast dye is injected into an IV tube in your arm. The contrast dye acts as a  highlighter as it flows through the veins. With the CT scan, it lets your health care provider see:   If the coronary arteries in your heart are more narrow than they should be, or if they are blocked.  If there is fluid around the heart.  If the muscles and tissues of the heart look weak or show signs of disease.  If the lungs contain any blood clots. LET [facility] CARE PROVIDER KNOW ABOUT:  Any allergies you have.   All medicines you are taking, including vitamins, herbs, eye drops, creams, and over-the-counter medicines.  Previous problems you or members of your family have had with the use of anesthetics.  Any blood disorders you have.  Previous surgeries you have had.  Medical conditions you have. RISKS AND COMPLICATIONS Generally, this is a safe procedure. However, as with any procedure, problems can occur. Possible problems include:   Allergic reaction to the contrast dye. This can range from mild to severe and may include:   Itching at the IV tube insertion site.   Redness at the IV tube insertion site.   Hives.   Nausea.   Difficulty breathing.   Kidney failure.   Problems from radiation exposure. This test involves the use of radiation. Radiation exposure can be dangerous to a pregnant patient and fetus. If you are pregnant, shields are used to protect your belly and pelvic area. More details are available from your health care provider. BEFORE THE PROCEDURE  The day before the test:    Stop drinking caffeinated beverages. These include energy drinks, tea, soda, coffee, and hot chocolate.  Stop taking medicines to treat erectile dysfunction. They can interfere with medicines you may be given during the procedure. Check with your health care provider if you should stop taking any other  medicines. On the day of the test:   About 4 hours before the test, stop eating and drinking anything but water as advised by your health care provider.  Avoid wearing jewelry. You will have to undress from the waist up and wear a hospital gown. PROCEDURE  The hair on your chest may need to be shaved. This is done because small sticky patches called electrodes are put on your chest. These transmit information that helps monitor your heart during the test.  You might be given heart medicine during the test. This is done to control your heart rate during the test so a good image is obtained.  An IV tube will be inserted in your arm.  You will be asked to lie on a table with your arms above your head.  The contrast dye will be injected into the IV tube. You might feel warm or you may get a metallic taste in your mouth.  The table you are lying on will move into a large machine that will do the scanning.  You will be able to see, hear, and talk to the person running the machine while you are in it. Follow that person's directions. You may be asked to hold your breath for 2-3 seconds as pictures are taken.  The CT machine will move around you to take pictures. Do not move while it is scanning. This helps to get a good image of your heart.  When the best possible pictures have been taken, the machine will be turned off. The table will move out of the machine. The IV tube will then be removed. AFTER THE PROCEDURE  You will be allowed to get dressed and return  to your normal activities.  Results will be interpreted by the health care provider and the results will be discussed with you. This information is not intended to replace advice given to you by your health care provider. Make sure you discuss any questions you have with your health care provider. Document Released: 09/09/2008 Document Revised: 10/18/2014 Document Reviewed: 06/13/2013 Elsevier Interactive Patient Education  2017  [employer].    If you need a refill on your cardiac medications before your next appointment, please call your pharmacy.      Signed, Angelena Form, PA-C  11/04/2016 2:59 PM    [facility] [place], [place], [place]  [address] Phone: [phone]; Fax: [phone]

## 2016-11-04 ENCOUNTER — Ambulatory Visit (INDEPENDENT_AMBULATORY_CARE_PROVIDER_SITE_OTHER): Payer: Medicare Other | Admitting: Physician Assistant

## 2016-11-04 ENCOUNTER — Encounter: Payer: Self-pay | Admitting: Physician Assistant

## 2016-11-04 VITALS — BP 155/68 | HR 88 | Ht 67.0 in | Wt 193.8 lb

## 2016-11-04 DIAGNOSIS — G4733 Obstructive sleep apnea (adult) (pediatric): Secondary | ICD-10-CM

## 2016-11-04 DIAGNOSIS — R002 Palpitations: Secondary | ICD-10-CM

## 2016-11-04 DIAGNOSIS — I10 Essential (primary) hypertension: Secondary | ICD-10-CM | POA: Diagnosis not present

## 2016-11-04 DIAGNOSIS — R079 Chest pain, unspecified: Secondary | ICD-10-CM

## 2016-11-04 DIAGNOSIS — I493 Ventricular premature depolarization: Secondary | ICD-10-CM

## 2016-11-04 DIAGNOSIS — R0789 Other chest pain: Secondary | ICD-10-CM

## 2016-11-04 MED ORDER — DILTIAZEM HCL ER COATED BEADS 300 MG PO CP24: 300.0000 mg | ORAL_CAPSULE | Freq: Every day | ORAL | 11 refills | Status: DC

## 2016-11-04 NOTE — Patient Instructions (Addendum)
Medic1.  ation Instructions:  Your physician has recommended you make the following change in your medication:  1.  INCREASE the Diltiazem to 300 mg taking 1 tablet daily  Labwork: None ordered  Testing/Procedures: Your physician has requested that you have cardiac CT. Cardiac computed tomography (CT) is a painless test that uses an x-ray machine to take clear, detailed pictures of your heart. For further information please visit [facility]ugeFiesta[facility][email or web address]. Please follow instruction sheet as given.     Follow-Up: Your physician recommends that you schedule a follow-up appointment in: 1ST AVAILABLE WITH DR. Radford Pax   Any Other Special Instructions Will Be Listed Below (If Applicable).   Cardiac CT Angiogram A cardiac CT angiogram is a test to help your health care provider find out why you are having chest pains or other symptoms of heart disease. The test uses an advanced type of X-ray machine that scans your heart and the area around the heart and creates multiple pictures of it. Other names for the test are coronary CT angiography, coronary artery scanning, and CTA.  The test is painless and fairly quick. It is noninvasive. That means it does not involve any type of surgery or cuts (incisions). Instead, a fluid called contrast dye is injected into an IV tube in your arm. The contrast dye acts as a highlighter as it flows through the veins. With the CT scan, it lets your health care provider see:   If the coronary arteries in your heart are more narrow than they should be, or if they are blocked.  If there is fluid around the heart.  If the muscles and tissues of the heart look weak or show signs of disease.  If the lungs contain any blood clots. LET [facility] CARE PROVIDER KNOW ABOUT:  Any allergies you have.   All medicines you are taking, including vitamins, herbs, eye drops, creams, and over-the-counter medicines.  Previous problems you or members of your family have had with the  use of anesthetics.  Any blood disorders you have.  Previous surgeries you have had.  Medical conditions you have. RISKS AND COMPLICATIONS Generally, this is a safe procedure. However, as with any procedure, problems can occur. Possible problems include:   Allergic reaction to the contrast dye. This can range from mild to severe and may include:   Itching at the IV tube insertion site.   Redness at the IV tube insertion site.   Hives.   Nausea.   Difficulty breathing.   Kidney failure.   Problems from radiation exposure. This test involves the use of radiation. Radiation exposure can be dangerous to a pregnant patient and fetus. If you are pregnant, shields are used to protect your belly and pelvic area. More details are available from your health care provider. BEFORE THE PROCEDURE  The day before the test:    Stop drinking caffeinated beverages. These include energy drinks, tea, soda, coffee, and hot chocolate.  Stop taking medicines to treat erectile dysfunction. They can interfere with medicines you may be given during the procedure. Check with your health care provider if you should stop taking any other medicines. On the day of the test:   About 4 hours before the test, stop eating and drinking anything but water as advised by your health care provider.  Avoid wearing jewelry. You will have to undress from the waist up and wear a hospital gown. PROCEDURE  The hair on your chest may need to be shaved. This is done because small sticky  patches called electrodes are put on your chest. These transmit information that helps monitor your heart during the test.  You might be given heart medicine during the test. This is done to control your heart rate during the test so a good image is obtained.  An IV tube will be inserted in your arm.  You will be asked to lie on a table with your arms above your head.  The contrast dye will be injected into the IV tube. You  might feel warm or you may get a metallic taste in your mouth.  The table you are lying on will move into a large machine that will do the scanning.  You will be able to see, hear, and talk to the person running the machine while you are in it. Follow that person's directions. You may be asked to hold your breath for 2-3 seconds as pictures are taken.  The CT machine will move around you to take pictures. Do not move while it is scanning. This helps to get a good image of your heart.  When the best possible pictures have been taken, the machine will be turned off. The table will move out of the machine. The IV tube will then be removed. AFTER THE PROCEDURE  You will be allowed to get dressed and return to your normal activities.  Results will be interpreted by the health care provider and the results will be discussed with you. This information is not intended to replace advice given to you by your health care provider. Make sure you discuss any questions you have with your health care provider. Document Released: 09/09/2008 Document Revised: 10/18/2014 Document Reviewed: 06/13/2013 Elsevier Interactive Patient Education  2017 [employer].    If you need a refill on your cardiac medications before your next appointment, please call your pharmacy.

## 2016-11-10 DIAGNOSIS — E1165 Type 2 diabetes mellitus with hyperglycemia: Secondary | ICD-10-CM | POA: Diagnosis not present

## 2016-11-10 DIAGNOSIS — Z794 Long term (current) use of insulin: Secondary | ICD-10-CM | POA: Diagnosis not present

## 2016-11-10 DIAGNOSIS — Z5181 Encounter for therapeutic drug level monitoring: Secondary | ICD-10-CM | POA: Diagnosis not present

## 2016-11-10 DIAGNOSIS — M8580 Other specified disorders of bone density and structure, unspecified site: Secondary | ICD-10-CM | POA: Diagnosis not present

## 2016-11-16 ENCOUNTER — Telehealth: Payer: Self-pay | Admitting: Cardiology

## 2016-11-16 ENCOUNTER — Encounter: Payer: Self-pay | Admitting: Cardiovascular Disease

## 2016-11-16 NOTE — Telephone Encounter (Signed)
Spoke with Alice Cole to schedule her for the Cardiac Ct.  She stated that she want to wait several weeks due to she just started Insulin.  She has been having problems getting her blood sugar under controlled.  She will call back to schedule.

## 2016-12-01 ENCOUNTER — Ambulatory Visit: Payer: Medicare Other | Admitting: Cardiology

## 2016-12-21 ENCOUNTER — Encounter: Payer: Self-pay | Admitting: Cardiology

## 2016-12-21 ENCOUNTER — Ambulatory Visit (INDEPENDENT_AMBULATORY_CARE_PROVIDER_SITE_OTHER): Payer: Medicare Other | Admitting: Cardiology

## 2016-12-21 VITALS — BP 168/70 | HR 94 | Ht 67.0 in | Wt 196.4 lb

## 2016-12-21 DIAGNOSIS — R002 Palpitations: Secondary | ICD-10-CM | POA: Diagnosis not present

## 2016-12-21 DIAGNOSIS — I10 Essential (primary) hypertension: Secondary | ICD-10-CM

## 2016-12-21 DIAGNOSIS — R0602 Shortness of breath: Secondary | ICD-10-CM | POA: Diagnosis not present

## 2016-12-21 DIAGNOSIS — R0789 Other chest pain: Secondary | ICD-10-CM | POA: Diagnosis not present

## 2016-12-21 DIAGNOSIS — I493 Ventricular premature depolarization: Secondary | ICD-10-CM

## 2016-12-21 DIAGNOSIS — R0600 Dyspnea, unspecified: Secondary | ICD-10-CM

## 2016-12-21 DIAGNOSIS — R0609 Other forms of dyspnea: Secondary | ICD-10-CM

## 2016-12-21 LAB — BASIC METABOLIC PANEL
BUN/Creatinine Ratio: 20 (ref 12–28)
BUN: 19 mg/dL (ref 8–27)
CO2: 22 mmol/L (ref 18–29)
Calcium: 9.9 mg/dL (ref 8.7–10.3)
Chloride: 96 mmol/L (ref 96–106)
Creatinine, Ser: 0.95 mg/dL (ref 0.57–1.00)
GFR calc Af Amer: 70 mL/min/{1.73_m2} (ref >59)
GFR calc non Af Amer: 61 mL/min/{1.73_m2} (ref >59)
Glucose: 282 mg/dL — ABNORMAL HIGH (ref 65–99)
Potassium: 4.1 mmol/L (ref 3.5–5.2)
Sodium: 138 mmol/L (ref 134–144)

## 2016-12-21 LAB — TSH: TSH: 2.07 u[IU]/mL (ref 0.450–4.500)

## 2016-12-21 LAB — PRO B NATRIURETIC PEPTIDE: NT-Pro BNP: 104 pg/mL (ref 0–301)

## 2016-12-21 MED ORDER — DILTIAZEM HCL ER COATED BEADS 360 MG PO CP24: 360.0000 mg | ORAL_CAPSULE | Freq: Every day | ORAL | 3 refills | Status: DC

## 2016-12-21 NOTE — Patient Instructions (Addendum)
Medication Instructions:  1) INCREASE CARDIZEM to 360 mg daily  Labwork: TODAY: BMET, TSH, BNP  Testing/Procedures: None  Follow-Up: You have an appointment in the [facility] 12/29/16 at 10:30AM.  Your physician wants you to follow-up in: 6 months with Dr. Radford Pax. You will receive a reminder letter in the mail two months in advance. If you don't receive a letter, please call our office to schedule the follow-up appointment.   Any Other Special Instructions Will Be Listed Below (If Applicable).     If you need a refill on your cardiac medications before your next appointment, please call your pharmacy.

## 2016-12-21 NOTE — Progress Notes (Signed)
Cardiology Office Note    Date:  12/21/2016   ID:  Alice Cole, DOB [birth year], MRN [id number]  PCP:  Jonathon Bellows, MD  Cardiologist:  Fransico Him, MD   Chief Complaint  Patient presents with  . Follow-up    PVCs, HTN, OSA    History of Present Illness:  Alice Cole is a 71 y.o. female who presents for followup of OSA/PVCs and HTN. She has a history of OSA but does not use CPAP on a regular basis. She has a history of PVCs and normal coronary arteries by cath in 2003. Nuclear stress test showed no ischemia in 2017 and 2D echo showed normal LVF with diastolic dysfunction.  She saw my PA a few months ago due to increased palpitations and chest discomfort.  Her Cardizem was increased to 300mg  daily and a coronary CTA was ordered but she did not follow through with the CTA.  Since going up on the Cardizem her palpitations have significantly improved and her CP has resolved.  She presents back for followup today.  She is doing well.  She denies any further episodes of chest pain or pressure. She occasionally has some mild indigestion in the am when she first gets up and resolves after drinking sprite and belching.  She denies any dizziness or syncope. She does have DOE when  walking long distances or working hard in her house and sits down and it resolve.  She denies any PND, orthopnea.  She occasionally has some mild edema in her right foot.  She has not been compliant with her CPAP device but she just got new supplies and plans on starting it back.   Past Medical History:  Diagnosis Date  . Abnormal EKG 07/09/2016  . Abnormal liver function   . Adenomatous colon polyp   . Bigeminy   . Chest pain   . Colonic polyp   . Dermatitis   . Diabetes mellitus   . Diverticulitis    recurrent  . DVT (deep venous thrombosis) ([place])   . Esophageal stricture   . GERD (gastroesophageal reflux disease)   . Heart palpitations   . Hemorrhoids   . Hyperlipidemia   . Hypertension   . Obstructive  sleep apnea on CPAP    pt does not use  . OSA (obstructive sleep apnea) 07/09/2016   She has not been using her CPAP device because she needs new supplies  . PVC's (premature ventricular contractions)     Past Surgical History:  Procedure Laterality Date  . ABDOMINAL HYSTERECTOMY    . CHOLECYSTECTOMY     aprox 10 years ago  . KNEE SURGERY     Right  . KNEE SURGERY     Left    Current Medications: Current Meds  Medication Sig  . aspirin 325 MG tablet Take 325 mg by mouth daily.  Marland Kitchen atorvastatin (LIPITOR) 40 MG tablet Take 40 mg by mouth daily.  Marland Kitchen diltiazem (CARDIZEM CD) 300 MG 24 hr capsule Take 1 capsule (300 mg total) by mouth daily.  . hydrochlorothiazide (HYDRODIURIL) 25 MG tablet Take 25 mg by mouth daily.  . Insulin Aspart (NOVOLOG [place]) Inject 42 Units into the skin 3 (three) times daily.  . metFORMIN (GLUCOPHAGE) 1000 MG tablet Take 2,000 mg by mouth daily with supper.   Marland Kitchen omeprazole (PRILOSEC) 20 MG capsule Take 20 mg by mouth daily.  . ramipril (ALTACE) 10 MG capsule Take 10 mg by mouth 2 (two) times daily.  Allergies:   Metoprolol; Nortriptyline; Olmesartan; Tradjenta [linagliptin]; Alatrofloxacin; Benicar [olmesartan medoxomil]; Beta adrenergic blockers; Cartia xt [diltiazem]; Cephalexin; Codeine; Colesevelam; Crestor [rosuvastatin calcium]; Flagyl [metronidazole]; Fluvastatin sodium; Levemir [insulin detemir]; Lisinopril; Metoprolol succinate; Nortriptyline hcl; Pamelor [nortriptyline hcl]; Rosuvastatin; Sulfamethoxazole-trimethoprim; Verapamil; Welchol [colesevelam hcl]; Adhesive [tape]; Sitagliptin; and Trovan [trovafloxacin]   Social History   Social History  . Marital status: Married    Spouse name: N/A  . Number of children: 2  . Years of education: 11   Occupational History  . Retired    Social History Main Topics  . Smoking status: Never Smoker  . Smokeless tobacco: Never Used  . Alcohol use No     Comment: used to drink 30 yrs ago  . Drug use: No  .  Sexual activity: Not Asked   Other Topics Concern  . None   Social History Narrative   Regular exercise-yes   Caffeine use-yes   Lives with Husband and 3 graqdnsons and one is adopted   Husband drinks a lot-her husband drinks and smokes a lot   Used to be be married and is currently divorced   Daughters are on drugs     Family History:  The patient's family history includes Cancer in her daughter; Diabetes in her other; Heart attack (age of onset: 43) in her mother; Heart attack (age of onset: 20) in her maternal grandfather; Heart attack (age of onset: 35) in her maternal grandmother; Heart attack (age of onset: 15) in her brother; Heart disease in her other.   ROS:   Please see the history of present illness.    ROS All other systems reviewed and are negative.  No flowsheet data found.     PHYSICAL EXAM:   VS:  BP (!) 168/70   Pulse 94   Ht 5\' 7"  (1.702 m)   Wt 196 lb 6.4 oz (89.1 kg)   SpO2 97%   BMI 30.76 kg/m    GEN: Well nourished, well developed, in no acute distress  HEENT: normal  Neck: no JVD, carotid bruits, or masses Cardiac: RRR; no murmurs, rubs, or gallops,no edema.  Intact distal pulses bilaterally.  Respiratory:  clear to auscultation bilaterally, normal work of breathing GI: soft, nontender, nondistended, + BS MS: no deformity or atrophy  Skin: warm and dry, no rash Neuro:  Alert and Oriented x 3, Strength and sensation are intact Psych: euthymic mood, full affect  Wt Readings from Last 3 Encounters:  12/21/16 196 lb 6.4 oz (89.1 kg)  11/04/16 193 lb 12.8 oz (87.9 kg)  08/11/16 196 lb 8 oz (89.1 kg)      Studies/Labs Reviewed:   EKG:  EKG is  ordered today.  The ekg ordered today demonstrates   Recent Labs: 01/20/2016: Brain Natriuretic Peptide 50.4   Lipid Panel    Component Value Date/Time   CHOL  08/25/2010 0404    194        ATP III CLASSIFICATION:  <200     mg/dL   Desirable  200-239  mg/dL   Borderline High  >=240    mg/dL    High          TRIG 175 (H) 08/25/2010 0404   HDL 38 (L) 08/25/2010 0404   CHOLHDL 5.1 08/25/2010 0404   VLDL 35 08/25/2010 0404   LDLCALC (H) 08/25/2010 0404    121        Total Cholesterol/HDL:CHD Risk Coronary Heart Disease Risk Table  Men   Women  1/2 Average Risk   3.4   3.3  Average Risk       5.0   4.4  2 X Average Risk   9.6   7.1  3 X Average Risk  23.4   11.0        Use the calculated Patient Ratio above and the CHD Risk Table to determine the patient's CHD Risk.        ATP III CLASSIFICATION (LDL):  <100     mg/dL   Optimal  100-129  mg/dL   Near or Above                    Optimal  130-159  mg/dL   Borderline  160-189  mg/dL   High  >190     mg/dL   Very High    Additional studies/ records that were reviewed today include:  none    ASSESSMENT:    1. Heart palpitations   2. Premature ventricular contraction   3. Essential hypertension, benign   4. Chest pressure      PLAN:  In order of problems listed above:  1.  Palpitations: she has known PVCs and accelerated junctional tachycardia on recent monitor. She is intolerant to BBs.  These are much improved on higher dose of CCB.    2.  PVCs: These are fairly well controlled.  She will continue CCB  3.  HTN: BP elevated today. She wll continue Altace 20mg  daily and diuretic.  I will increase Cardizem CD to 360mg  daily and followup in HTN clinic in 1 week.  I have counseled her in a low sodium diet and increase her exercise.   4.  OSA: she is noncompliant with her CPAP.  She has gotten new supplies and is going to restart her CPAP.    5.  Chest pressure: This sounds GI in etiology and resolves with belching.  The chest discomfort she had in January has completely resolved. She had a normal cath in 2003, ow risk nuc in 12/2015 and coronary CTA was ordered but she did not follow through.   6.  DOE - I suspect this is due to obesity, poorly controlled HTN, deconditioning as well as  diastolic dysfunction.  She will continue on her diuretic.  I will check a BNP.  I will check PFTs with DLCO to rule out COPD or restrictive lung disease from obesity.  I    Medication Adjustments/Labs and Tests Ordered: Current medicines are reviewed at length with the patient today.  Concerns regarding medicines are outlined above.  Medication changes, Labs and Tests ordered today are listed in the Patient Instructions below.  There are no Patient Instructions on file for this visit.   Signed, Fransico Him, MD  12/21/2016 10:41 AM    [address], [place], [place]  [address] Phone: [phone]; Fax: [phone]

## 2016-12-27 ENCOUNTER — Ambulatory Visit: Payer: Medicare Other | Admitting: Cardiology

## 2016-12-27 ENCOUNTER — Telehealth: Payer: Self-pay | Admitting: Cardiology

## 2016-12-27 NOTE — Telephone Encounter (Signed)
New message    Pt c/o medication issue:  1. Name of Medication: cardizem 360mg   2. How are you currently taking this medication (dosage and times per day)? 1 cap  3. Are you having a reaction (difficulty breathing--STAT)? no  4. What is your medication issue? Pt states insurance would not cover new dose. She is taking the 300 mg. She is also asking if she should come in for check on Wednesday.

## 2016-12-27 NOTE — Telephone Encounter (Signed)
Spoke with pt and she states the pharmacy won't cover Cardizem 360mg  daily so she has continued taking her 300mg  dose. Reports her BP was good today at 113/76. Advised her to continue taking her 300mg  dose and will follow up with her as scheduled on Wednesday in the HTN clinic. If pt does need a dose increase, will contact her pharmacy for a brand that they will cover.

## 2016-12-29 ENCOUNTER — Encounter: Payer: Self-pay | Admitting: Pharmacist

## 2016-12-29 ENCOUNTER — Ambulatory Visit (INDEPENDENT_AMBULATORY_CARE_PROVIDER_SITE_OTHER): Payer: Medicare Other | Admitting: Pharmacist

## 2016-12-29 ENCOUNTER — Ambulatory Visit: Payer: Medicare Other

## 2016-12-29 VITALS — BP 138/70 | HR 88

## 2016-12-29 DIAGNOSIS — I10 Essential (primary) hypertension: Secondary | ICD-10-CM

## 2016-12-29 MED ORDER — DILTIAZEM HCL ER COATED BEADS 300 MG PO CP24: 300.0000 mg | ORAL_CAPSULE | Freq: Every day | ORAL | 11 refills | Status: DC

## 2016-12-29 NOTE — Progress Notes (Signed)
Patient ID: [name]                 DOB: 1946/03/20                      MRN: [id number]     HPI: Alice Cole is a 71 y.o. female referred by Dr. Radford Pax to HTN clinic. PMH of OSA, PVCs, LVF with diastolic dysfunction, and chest pain. She saw Dr. Radford Pax 1 week ago for follow up of chest palpitations, and her diltiazem was increased to 360 mg daily. However, pt called clinic a few days later stating that her insurance would not cover the 360mg  strength. Reported her BP was controlled at 113/76. Advised pt to continue with the 300mg  strength and f/u in clinic today.  Patient presents today with reported controlled blood pressures at home. She feels good on her current regimen. Her only complaint is trouble breathing with exertion, but states that she has a pulmonary test scheduled.   Current HTN meds: Ramipril 10 mg BID Diltiazem 300 mg daily- morning  HCTZ 25 mg daily- morning  Previously tried: Metoprolol - anaphylaxis, olmesartan - anaphylaxis, verapamil - swelling and chest pain, hydralazine - unknown  BP goal: <140/53mmHg  Family History: Mother- died from MI (73), HTN; Brother- died from MI at 35, HTN  Social History: Patient does not use tobacco products, but notes that her husband smokes "all the time". She does not drink alcohol  Diet: She mostly cooks at home and eats a lot vegetables with little meats. Since seeing Dr. Radford Pax, she started trying to avoid salting her foods. She does not drink coffee or soft drinks. She mostly drinks water and green tea.  Exercise: Used to walk around the neighborhood, but doesn't anymore due to breathing problems.   Home BP readings: She checks, but doesn't remember any numbers.   Wt Readings from Last 3 Encounters:  12/21/16 196 lb 6.4 oz (89.1 kg)  11/04/16 193 lb 12.8 oz (87.9 kg)  08/11/16 196 lb 8 oz (89.1 kg)   BP Readings from Last 3 Encounters:  12/21/16 (!) 168/70  11/04/16 (!) 155/68  08/11/16 181/72   Pulse Readings from  Last 3 Encounters:  12/21/16 94  11/04/16 88  08/11/16 78    Renal function: Estimated Creatinine Clearance: 63.2 mL/min (by C-G formula based on SCr of 0.95 mg/dL).  Past Medical History:  Diagnosis Date  . Abnormal EKG 07/09/2016  . Abnormal liver function   . Adenomatous colon polyp   . Bigeminy   . Chest pain   . Colonic polyp   . Dermatitis   . Diabetes mellitus   . Diverticulitis    recurrent  . DVT (deep venous thrombosis) ([place])   . Esophageal stricture   . GERD (gastroesophageal reflux disease)   . Heart palpitations   . Hemorrhoids   . Hyperlipidemia   . Hypertension   . Obstructive sleep apnea on CPAP    pt does not use  . OSA (obstructive sleep apnea) 07/09/2016   She has not been using her CPAP device because she needs new supplies  . PVC's (premature ventricular contractions)     Current Outpatient Prescriptions on File Prior to Visit  Medication Sig Dispense Refill  . aspirin 325 MG tablet Take 325 mg by mouth daily.    Marland Kitchen atorvastatin (LIPITOR) 40 MG tablet Take 40 mg by mouth daily.    Marland Kitchen diltiazem (CARDIZEM CD) 360 MG 24 hr capsule Take  1 capsule (360 mg total) by mouth daily. 90 capsule 3  . hydrochlorothiazide (HYDRODIURIL) 25 MG tablet Take 25 mg by mouth daily.    . Insulin Aspart (NOVOLOG [place]) Inject 42 Units into the skin 3 (three) times daily.    . metFORMIN (GLUCOPHAGE) 1000 MG tablet Take 2,000 mg by mouth daily with supper.     Marland Kitchen omeprazole (PRILOSEC) 20 MG capsule Take 20 mg by mouth daily.    . ramipril (ALTACE) 10 MG capsule Take 10 mg by mouth 2 (two) times daily.     No current facility-administered medications on file prior to visit.     Allergies  Allergen Reactions  . Metoprolol Anaphylaxis  . Nortriptyline Anaphylaxis  . Olmesartan Anaphylaxis  . Tradjenta [Linagliptin] Shortness Of Breath  . Alatrofloxacin Dermatitis  . Benicar [Olmesartan Medoxomil] Nausea Only and Other (See Comments)    Urinary difficulty  . Beta Adrenergic  Blockers Other (See Comments)    Reaction: Low Heart Rate  . Cartia Xt [Diltiazem] Itching  . Cephalexin Nausea And Vomiting    REACTION: nausea, vomiting, diarrhea  . Codeine Other (See Comments)    tachycardia  . Colesevelam Nausea Only  . Crestor [Rosuvastatin Calcium] Other (See Comments)    Leg cramps  . Flagyl [Metronidazole] Nausea Only  . Fluvastatin Sodium Itching  . Levemir [Insulin Detemir] Itching and Other (See Comments)    Bad mood  . Lisinopril Nausea Only  . Metoprolol Succinate Other (See Comments)    Low heart rate  . Nortriptyline Hcl Other (See Comments)    Reaction unknown  . Pamelor [Nortriptyline Hcl] Other (See Comments)    Nightmares  . Rosuvastatin Other (See Comments)    REACTION: cramps  . Sulfamethoxazole-Trimethoprim Nausea Only and Other (See Comments)    REACTION: nausea, irreg. heartrate  . Verapamil Swelling and Other (See Comments)    Chest pain  . Welchol [Colesevelam Hcl] Nausea And Vomiting  . Adhesive [Tape] Rash    Band Aids  . Sitagliptin Rash  . Trovan [Trovafloxacin] Rash and Other (See Comments)     Assessment/Plan:  Hypertension: Patient's hypertension is controlled at her goal of <140/90 at clinic and at home.  1. Continue current regimen of ramipril 10 mg BID, diltiazem 300 mg daily, HCTZ 25 mg daily  Patient advised to call clinic with any questions or concerns.   Patient was seen today with Catie Darnelle Maffucci, PharmD Candidate 2018  Owen Pagnotta E. Caidence Kaseman, PharmD, CPP, [place] [address], [place], [place] [address] Phone: [phone]; Fax: [phone] 12/29/2016 1:50 PM

## 2016-12-29 NOTE — Patient Instructions (Signed)
Return for a a follow up appointment as needed   Your blood pressure today is 138/70  Check your blood pressure at home daily (if able) and keep record of the readings.  Take your BP meds as follows: CONTINUE ramipril 10 mg BID, diltiazem 300 mg, and HCTZ 25 mg daily.  Bring all of your meds, your BP cuff and your record of home blood pressures to your next appointment.  Exercise as you're able, try to walk approximately 30 minutes per day.  Keep salt intake to a minimum, especially watch canned and prepared boxed foods.  Eat more fresh fruits and vegetables and fewer canned items.  Avoid eating in fast food restaurants.    HOW TO TAKE YOUR BLOOD PRESSURE: . Rest 5 minutes before taking your blood pressure. .  Don't smoke or drink caffeinated beverages for at least 30 minutes before. . Take your blood pressure before (not after) you eat. . Sit comfortably with your back supported and both feet on the floor (don't cross your legs). . Elevate your arm to heart level on a table or a desk. . Use the proper sized cuff. It should fit smoothly and snugly around your bare upper arm. There should be enough room to slip a fingertip under the cuff. The bottom edge of the cuff should be 1 inch above the crease of the elbow. . Ideally, take 3 measurements at one sitting and record the average.

## 2017-01-11 DIAGNOSIS — M8580 Other specified disorders of bone density and structure, unspecified site: Secondary | ICD-10-CM | POA: Diagnosis not present

## 2017-01-11 DIAGNOSIS — Z794 Long term (current) use of insulin: Secondary | ICD-10-CM | POA: Diagnosis not present

## 2017-01-11 DIAGNOSIS — E1165 Type 2 diabetes mellitus with hyperglycemia: Secondary | ICD-10-CM | POA: Diagnosis not present

## 2017-01-11 DIAGNOSIS — Z5181 Encounter for therapeutic drug level monitoring: Secondary | ICD-10-CM | POA: Diagnosis not present

## 2017-01-12 ENCOUNTER — Ambulatory Visit (INDEPENDENT_AMBULATORY_CARE_PROVIDER_SITE_OTHER): Payer: Medicare Other | Admitting: Internal Medicine

## 2017-01-12 DIAGNOSIS — R0609 Other forms of dyspnea: Secondary | ICD-10-CM | POA: Diagnosis not present

## 2017-01-12 DIAGNOSIS — R0600 Dyspnea, unspecified: Secondary | ICD-10-CM

## 2017-01-12 LAB — PULMONARY FUNCTION TEST
DL/VA % pred: 114 %
DL/VA: 5.87 ml/min/mmHg/L
DLCO cor % pred: 65 %
DLCO cor: 18.48 ml/min/mmHg
DLCO unc % pred: 61 %
DLCO unc: 17.5 ml/min/mmHg
FEF 25-75 Post: 2.38 L/sec
FEF 25-75 Pre: 2.17 L/sec
FEF2575-%Change-Post: 9 %
FEF2575-%Pred-Post: 115 %
FEF2575-%Pred-Pre: 106 %
FEV1-%Change-Post: 1 %
FEV1-%Pred-Post: 73 %
FEV1-%Pred-Pre: 72 %
FEV1-Post: 1.87 L
FEV1-Pre: 1.83 L
FEV1FVC-%Change-Post: 0 %
FEV1FVC-%Pred-Pre: 111 %
FEV6-%Change-Post: 1 %
FEV6-%Pred-Post: 68 %
FEV6-%Pred-Pre: 67 %
FEV6-Post: 2.2 L
FEV6-Pre: 2.16 L
FEV6FVC-%Pred-Post: 104 %
FEV6FVC-%Pred-Pre: 104 %
FVC-%Change-Post: 1 %
FVC-%Pred-Post: 65 %
FVC-%Pred-Pre: 64 %
FVC-Post: 2.2 L
FVC-Pre: 2.16 L
Post FEV1/FVC ratio: 85 %
Post FEV6/FVC ratio: 100 %
Pre FEV1/FVC ratio: 85 %
Pre FEV6/FVC Ratio: 100 %

## 2017-01-12 NOTE — Progress Notes (Signed)
PFT done today. 

## 2017-01-13 ENCOUNTER — Telehealth: Payer: Self-pay

## 2017-01-13 DIAGNOSIS — R942 Abnormal results of pulmonary function studies: Secondary | ICD-10-CM

## 2017-01-13 NOTE — Telephone Encounter (Signed)
Informed patient of results and verbal understanding expressed.  Pulmonary referral ordered for scheduling. Patient agrees with treatment plan.

## 2017-01-13 NOTE — Telephone Encounter (Signed)
-----   Message from Sueanne Margarita, MD sent at 01/13/2017  9:03 AM EDT ----- Abnormal PFTs with SOB - please refer to Dr. Chase Caller

## 2017-02-16 ENCOUNTER — Ambulatory Visit (INDEPENDENT_AMBULATORY_CARE_PROVIDER_SITE_OTHER): Payer: Medicare Other | Admitting: Internal Medicine

## 2017-02-16 ENCOUNTER — Encounter: Payer: Self-pay | Admitting: Internal Medicine

## 2017-02-16 DIAGNOSIS — R0602 Shortness of breath: Secondary | ICD-10-CM

## 2017-02-16 NOTE — Assessment & Plan Note (Addendum)
Unclear cause. There is "restrictive" defect on pulmonary function test. Therefore we need to rule out a condition called pulmonary fibrosis.   Plan - High resolution CT chest supine and prone image; we will call you with the results - If high-resolution CT chest is negative for pulmonary fibrosis then we will do bike pulmonary stress test to look for reasons for shortness of breath which might include your weight, and lack of physical exercise  Followup  - based on CT result

## 2017-02-16 NOTE — Progress Notes (Signed)
Subjective:    Patient ID: Alice Cole, female    DOB: [birth year], 71 y.o.   MRN: [id number]  PCP Maurice Small, MD   HPI  IOV 02/16/2017  Chief Complaint  Patient presents with  . Pulmonary Consult    Pt referred by Dr. Fransico Him for abnormal PFT. Pt c/o DOE. Pt denies CP/tightness, cough and f/c/s.    71 year old obese female referred by Dr. Radford Pax of cardiology for dyspnea. Patient does be that she's been dyspneic for 1 year. Insidious onset. Stable since onset not progressive. A year ago she could do also work and walking around the yard and outside the house without any problems but currently has to stop and take rest. This no associated chest pain or cough or wheezing or orthopnea paroxysmal nocturnal dyspnea. Symptoms onset correlate with her stopping regular exercise. There is no change in weight gain. This no other clear cut aggravating or relieving factors. Review of the chart shows that she has obstructive sleep apnea but she's not compliant with CPAP. She has had previous cardiac stress test that was normal. Echo ejection fraction is normal. CT chest lung cut in the last 3 years looks normal on  personal visualization of the image. Dr. Radford Pax on office visit 12/21/2016 suspected obesity and diastolic dysfunction as a reason for dyspnea but then showed a pulmonary function test reflected below and images that I personally visualized shows restrictive defect with low diffusion capacity and therefore patient has been referred here. It is noted that she is on ACE inhibitor's but she does not have any cough.   Walking desaturation test 185 feet 3 laps on room air:    General likes lambda G SK allergy starting018 09:06  Ref. Range 01/12/2017 11:51  FVC-Pre Latest Units: L 2.16  FVC-%Pred-Pre Latest Units: % 64  FEV1-Pre Latest Units: L 1.83  FEV1-%Pred-Pre Latest Units: % 72  Pre FEV1/FVC ratio Latest Units: % 85   Results for MYCHAEL, SOOTS (MRN [id number]) as of 02/16/2017 09:06  Ref. Range 01/12/2017 11:51  DLCO cor Latest Units: ml/min/mmHg 18.48  DLCO cor %Change weekly Talk about pred Latest Units: % 65  Results for KIRSTA, PROBERT (MRN [id number]) as of 02/16/2017 09:06  Ref. Range 01/20/2016 11:53 12/21/2016 11:05  Creatinine Latest Ref Range: 0.57 - 1.00 mg/dL  0.95  Results for SHELLENE, SWEIGERT (MRN [id number]) as of 02/16/2017 09:06  Ref. Range 01/20/2016 11:53 12/21/2016 11:05  Brain Natriuretic Peptide Latest Ref Range: <100 pg/mL 50.4     Myocardial perfusion test 01/14/16 - normal  Abdominal CT 06/14/2014 lung cut: Clear lung fields   has a past medical history of Abnormal EKG (07/09/2016); Abnormal liver function; Adenomatous colon polyp; Bigeminy; Chest pain; Colonic polyp; Dermatitis; Diabetes mellitus; Diverticulitis; DVT (deep venous thrombosis) ([place]); Esophageal stricture; GERD (gastroesophageal reflux disease); Heart palpitations; Hemorrhoids; Hyperlipidemia; Hypertension; Obstructive sleep apnea on CPAP; OSA (obstructive sleep apnea) (07/09/2016); and PVC's (premature ventricular contractions).   reports that she has never smoked. She has never used smokeless tobacco.  Past Surgical History:  Procedure Laterality Date  . ABDOMINAL HYSTERECTOMY    . CHOLECYSTECTOMY     aprox 10 years ago  . EYE SURGERY     right and left eye  . KNEE SURGERY     Right  . KNEE SURGERY     Left    Allergies  Allergen Reactions  . Metoprolol Anaphylaxis  . Nortriptyline Anaphylaxis  . Olmesartan Anaphylaxis  . Tradjenta [Linagliptin] Shortness Of Breath  .  Alatrofloxacin Dermatitis  . Benicar [Olmesartan Medoxomil] Nausea Only and Other (See Comments)    Urinary difficulty  . Beta Adrenergic Blockers Other (See Comments)    Reaction: Low Heart Rate  . Cartia Xt [Diltiazem] Itching  . Cephalexin Nausea And Vomiting    REACTION: nausea, vomiting, diarrhea  . Codeine Other (See Comments)    tachycardia  . Colesevelam Nausea Only  . Crestor [Rosuvastatin Calcium] Other  (See Comments)    Leg cramps  . Flagyl [Metronidazole] Nausea Only  . Fluvastatin Sodium Itching  . Levemir [Insulin Detemir] Itching and Other (See Comments)    Bad mood  . Lisinopril Nausea Only  . Metoprolol Succinate Other (See Comments)    Low heart rate  . Nortriptyline Hcl Other (See Comments)    Reaction unknown  . Pamelor [Nortriptyline Hcl] Other (See Comments)    Nightmares  . Rosuvastatin Other (See Comments)    REACTION: cramps  . Sulfamethoxazole-Trimethoprim Nausea Only and Other (See Comments)    REACTION: nausea, irreg. heartrate  . Verapamil Swelling and Other (See Comments)    Chest pain  . Welchol [Colesevelam Hcl] Nausea And Vomiting  . Adhesive [Tape] Rash    Band Aids  . Sitagliptin Rash  . Trovan [Trovafloxacin] Rash and Other (See Comments)    Immunization History  Administered Date(s) Administered  . Influenza Whole 07/11/2009  . Influenza,inj,Quad PF,36+ Mos 06/11/2016  . Pneumococcal Conjugate-13 10/11/2013  . Pneumococcal Polysaccharide-23 10/12/2011    Family History  Problem Relation Age of Onset  . Heart attack Mother 67  . Heart attack Brother 54  . Heart attack Maternal Grandmother 67  . Heart attack Maternal Grandfather 63  . Cancer Daughter     Ovarian Cancer  . Diabetes Other     Grandparent  . Heart disease Other     Grandparent     Current Outpatient Prescriptions:  .  aspirin 325 MG tablet, Take 325 mg by mouth daily., Disp: , Rfl:  .  atorvastatin (LIPITOR) 40 MG tablet, Take 40 mg by mouth daily., Disp: , Rfl:  .  diltiazem (CARDIZEM CD) 300 MG 24 hr capsule, Take 1 capsule (300 mg total) by mouth daily., Disp: 30 capsule, Rfl: 11 .  hydrochlorothiazide (HYDRODIURIL) 25 MG tablet, Take 25 mg by mouth daily., Disp: , Rfl:  .  Insulin Aspart (NOVOLOG [place]), Inject 42 Units into the skin 3 (three) times daily., Disp: , Rfl:  .  metFORMIN (GLUCOPHAGE) 1000 MG tablet, Take 2,000 mg by mouth daily with supper. , Disp: , Rfl:  .   omeprazole (PRILOSEC) 20 MG capsule, Take 20 mg by mouth daily., Disp: , Rfl:  .  ramipril (ALTACE) 10 MG capsule, Take 10 mg by mouth 2 (two) times daily., Disp: , Rfl:    Review of Systems  Constitutional: Negative for fever and unexpected weight change.  HENT: Negative for congestion, dental problem, ear pain, nosebleeds, postnasal drip, rhinorrhea, sinus pressure, sneezing, sore throat and trouble swallowing.   Eyes: Negative for redness and itching.  Respiratory: Positive for shortness of breath. Negative for cough, chest tightness and wheezing.   Cardiovascular: Negative for palpitations and leg swelling.  Gastrointestinal: Negative for nausea and vomiting.  Genitourinary: Negative for dysuria.  Musculoskeletal: Positive for arthralgias. Negative for joint swelling.  Skin: Negative for rash.  Neurological: Negative for headaches.  Hematological: Does not bruise/bleed easily.  Psychiatric/Behavioral: Negative for dysphoric mood. The patient is not nervous/anxious.        Objective:  Physical Exam  Constitutional: She is oriented to person, place, and time. She appears well-developed and well-nourished. No distress.  obese  HENT:  Head: Normocephalic and atraumatic.  Right Ear: External ear normal.  Left Ear: External ear normal.  Mouth/Throat: Oropharynx is clear and moist. No oropharyngeal exudate.  Eyes: Conjunctivae and EOM are normal. Pupils are equal, round, and reactive to light. Right eye exhibits no discharge. Left eye exhibits no discharge. No scleral icterus.  Neck: Normal range of motion. Neck supple. No JVD present. No tracheal deviation present. No thyromegaly present.  Cardiovascular: Normal rate, regular rhythm, normal heart sounds and intact distal pulses.  Exam reveals no gallop and no friction rub.   No murmur heard. Pulmonary/Chest: Effort normal and breath sounds normal. No respiratory distress. She has no wheezes. She has no rales. She exhibits no  tenderness.  Abdominal: Soft. Bowel sounds are normal. She exhibits no distension and no mass. There is no tenderness. There is no rebound and no guarding.  Musculoskeletal: Normal range of motion. She exhibits no edema or tenderness.  OA deformity of fingers +  Lymphadenopathy:    She has no cervical adenopathy.  Neurological: She is alert and oriented to person, place, and time. She has normal reflexes. No cranial nerve deficit. She exhibits normal muscle tone. Coordination normal.  Skin: Skin is warm and dry. No rash noted. She is not diaphoretic. No erythema. No pallor.  Psychiatric: She has a normal mood and affect. Her behavior is normal. Judgment and thought content normal.  Vitals reviewed.   Vitals:   02/16/17 0901  BP: (!) 150/84  Pulse: 89  SpO2: 97%  Weight: 191 lb 9.6 oz (86.9 kg)  Height: 5\' 7"  (1.702 m)    Estimated body mass index is 30.01 kg/m as calculated from the following:   Height as of this encounter: 5\' 7"  (1.702 m).   Weight as of this encounter: 191 lb 9.6 oz (86.9 kg).       Assessment & Plan:  SOB (shortness of breath) Unclear cause. There is "restrictive" defect on pulmonary function test. Therefore we need to rule out a condition called pulmonary fibrosis.   Plan - High resolution CT chest supine and prone image; we will call you with the results - If high-resolution CT chest is negative for pulmonary fibrosis then we will do bike pulmonary stress test to look for reasons for shortness of breath which might include your weight, and lack of physical exercise  Followup  - based on CT result    Dr. Brand Males, M.D., [facility].C.P Pulmonary and Critical Care Medicine Staff Physician [facility] Pager: [phone], If no answer or between  15:00h - 7:00h: call [phone]  02/16/2017 9:27 AM

## 2017-02-16 NOTE — Patient Instructions (Addendum)
SOB (shortness of breath) Unclear cause. There is "restrictive" defect on pulmonary function test. Therefore we need to rule out a condition called pulmonary fibrosis.   Plan - High resolution CT chest supine and prone image; we will call you with the results - If high-resolution CT chest is negative for pulmonary fibrosis then we will do bike pulmonary stress test to look for reasons for shortness of breath which might include your weight, and lack of physical exercise  Followup  - based on CT result  Followup baesd on ct rsults

## 2017-02-17 ENCOUNTER — Ambulatory Visit (INDEPENDENT_AMBULATORY_CARE_PROVIDER_SITE_OTHER)
Admission: RE | Admit: 2017-02-17 | Discharge: 2017-02-17 | Disposition: A | Discharge status: Home / Self Care | Payer: Medicare Other | Source: Ambulatory Visit | Attending: Internal Medicine | Admitting: Internal Medicine

## 2017-02-17 DIAGNOSIS — R911 Solitary pulmonary nodule: Secondary | ICD-10-CM | POA: Diagnosis not present

## 2017-02-17 DIAGNOSIS — I700 Atherosclerosis of aorta: Secondary | ICD-10-CM | POA: Diagnosis not present

## 2017-02-17 DIAGNOSIS — R0602 Shortness of breath: Secondary | ICD-10-CM | POA: Diagnosis not present

## 2017-02-23 ENCOUNTER — Telehealth: Payer: Self-pay | Admitting: Internal Medicine

## 2017-02-23 DIAGNOSIS — R0602 Shortness of breath: Secondary | ICD-10-CM

## 2017-02-23 NOTE — Telephone Encounter (Signed)
Ct 02/17/17 is not revealing a reason for dyspnea -> have her Alice Cole dpo CPST Bike test with EIB challenge and return to see APP o rmyself  Dr. Brand Males, M.D., [facility]C.P Pulmonary and Critical Care Medicine Staff Physician [place] [facility] Pager: [phone], If no answer or between  15:00h - 7:00h: call [phone]  02/23/2017 6:58 PM

## 2017-02-25 NOTE — Telephone Encounter (Signed)
Called and spoke to pt. Informed her of the results and recs per MR. Order placed for CPST. Pt verbalized understanding and denied any further questions or concerns at this time.   PCC's please advise when test has been scheduled so a f/u visit can be made. THanks.

## 2017-02-25 NOTE — Telephone Encounter (Signed)
Patient calling for results, CB is [phone]

## 2017-02-28 NOTE — Telephone Encounter (Signed)
CPST has been scheduled for 5/31 at 1:00.  I called pt & gave her that appt info.

## 2017-02-28 NOTE — Telephone Encounter (Signed)
Spoke with pt, scheduled to see TP on 6/4 to discuss cpst results.  Nothing further needed.

## 2017-03-01 DIAGNOSIS — M791 Myalgia: Secondary | ICD-10-CM | POA: Diagnosis not present

## 2017-03-01 DIAGNOSIS — E785 Hyperlipidemia, unspecified: Secondary | ICD-10-CM | POA: Diagnosis not present

## 2017-03-01 DIAGNOSIS — M79604 Pain in right leg: Secondary | ICD-10-CM | POA: Diagnosis not present

## 2017-03-01 DIAGNOSIS — M79605 Pain in left leg: Secondary | ICD-10-CM | POA: Diagnosis not present

## 2017-03-10 ENCOUNTER — Encounter (HOSPITAL_COMMUNITY): Payer: Medicare Other

## 2017-03-14 ENCOUNTER — Ambulatory Visit: Payer: Medicare Other | Admitting: Adult Health

## 2017-03-17 ENCOUNTER — Ambulatory Visit: Payer: Self-pay | Admitting: Podiatry

## 2017-03-21 ENCOUNTER — Ambulatory Visit (HOSPITAL_COMMUNITY): Payer: Medicare Other | Attending: Family Medicine

## 2017-03-21 DIAGNOSIS — R942 Abnormal results of pulmonary function studies: Secondary | ICD-10-CM | POA: Insufficient documentation

## 2017-03-21 DIAGNOSIS — R0600 Dyspnea, unspecified: Secondary | ICD-10-CM

## 2017-03-22 ENCOUNTER — Other Ambulatory Visit (HOSPITAL_COMMUNITY): Payer: Self-pay | Admitting: *Deleted

## 2017-03-22 DIAGNOSIS — R0602 Shortness of breath: Secondary | ICD-10-CM

## 2017-03-23 ENCOUNTER — Encounter: Payer: Self-pay | Admitting: Adult Health

## 2017-03-23 ENCOUNTER — Ambulatory Visit (INDEPENDENT_AMBULATORY_CARE_PROVIDER_SITE_OTHER): Payer: Medicare Other | Admitting: Adult Health

## 2017-03-23 DIAGNOSIS — R0602 Shortness of breath: Secondary | ICD-10-CM | POA: Diagnosis not present

## 2017-03-23 DIAGNOSIS — R0600 Dyspnea, unspecified: Secondary | ICD-10-CM | POA: Diagnosis not present

## 2017-03-23 NOTE — Progress Notes (Signed)
@Patient  ID: Alice Cole, female    DOB: 1946/05/09, 71 y.o.   MRN: [id number]  Chief Complaint  Patient presents with  . Follow-up    Dyspnea     Referring provider: Maurice Small, MD  HPI: 71 year old female never smoker seen for pulmonary consult 02/16/2017 for dyspnea for 1 year and abnormal pulmonary function test.  TEST  Myocardial perfusion to has 01/14/2016 was normal. CT chest high resolution 02/17/2017 shows no evidence of interstitial lung disease. Tiny 2-3 mm pulmonary nodules on right .  03/23/2017 Follow up : Dyspnea  Patient presents for a one-month follow-up. Patient was seen last visit for coronary consult for dyspnea and abnormal PFT. Pulmonary function test 01/12/2017 showed mild to moderate restriction with an FEV1 73%, ratio 85, FVC 65%, no significant bronchodilator response, DLCO 61%. CT chest showed no evidence of interstitial lung disease. Patient was set up for a cardiopulmonary exercise stress test done on 03/22/2017 showed low normal. Functional capacity. No exercise-induced bronchospasm. Appeared primarily limited due to body habitus and deconditioning. Circulatory limitations could not be excluded. Patient had a myocardial perfusion test 01/14/2016 that was normal. Says it is hard to exrecise due to knee pain.  Was walking last year twice daily but now it is hard due to knee pain and dyspnea.  Denies cough or wheezing .  Labs BNP neg. Says she is not anemic. Gets labs from PCP .      Allergies  Allergen Reactions  . Metoprolol Anaphylaxis  . Nortriptyline Anaphylaxis  . Olmesartan Anaphylaxis  . Tradjenta [Linagliptin] Shortness Of Breath  . Alatrofloxacin Dermatitis  . Benicar [Olmesartan Medoxomil] Nausea Only and Other (See Comments)    Urinary difficulty  . Beta Adrenergic Blockers Other (See Comments)    Reaction: Low Heart Rate  . Cartia Xt [Diltiazem] Itching  . Cephalexin Nausea And Vomiting    REACTION: nausea, vomiting, diarrhea  .  Codeine Other (See Comments)    tachycardia  . Colesevelam Nausea Only  . Crestor [Rosuvastatin Calcium] Other (See Comments)    Leg cramps  . Flagyl [Metronidazole] Nausea Only  . Fluvastatin Sodium Itching  . Levemir [Insulin Detemir] Itching and Other (See Comments)    Bad mood  . Lisinopril Nausea Only  . Metoprolol Succinate Other (See Comments)    Low heart rate  . Nortriptyline Hcl Other (See Comments)    Reaction unknown  . Pamelor [Nortriptyline Hcl] Other (See Comments)    Nightmares  . Rosuvastatin Other (See Comments)    REACTION: cramps  . Sulfamethoxazole-Trimethoprim Nausea Only and Other (See Comments)    REACTION: nausea, irreg. heartrate  . Verapamil Swelling and Other (See Comments)    Chest pain  . Welchol [Colesevelam Hcl] Nausea And Vomiting  . Adhesive [Tape] Rash    Band Aids  . Sitagliptin Rash  . Trovan [Trovafloxacin] Rash and Other (See Comments)    Immunization History  Administered Date(s) Administered  . Influenza Whole 07/11/2009  . Influenza,inj,Quad PF,36+ Mos 06/11/2016  . Pneumococcal Conjugate-13 10/11/2013  . Pneumococcal Polysaccharide-23 10/12/2011    Past Medical History:  Diagnosis Date  . Abnormal EKG 07/09/2016  . Abnormal liver function   . Adenomatous colon polyp   . Bigeminy   . Chest pain   . Colonic polyp   . Dermatitis   . Diabetes mellitus   . Diverticulitis    recurrent  . DVT (deep venous thrombosis) ([place])   . Esophageal stricture   . GERD (gastroesophageal reflux disease)   .  Heart palpitations   . Hemorrhoids   . Hyperlipidemia   . Hypertension   . Obstructive sleep apnea on CPAP    pt does not use  . OSA (obstructive sleep apnea) 07/09/2016   She has not been using her CPAP device because she needs new supplies  . PVC's (premature ventricular contractions)     Tobacco History: History  Smoking Status  . Never Smoker  Smokeless Tobacco  . Never Used   Counseling given: Not  Answered   Outpatient Encounter Prescriptions as of 03/23/2017  Medication Sig  . aspirin 325 MG tablet Take 325 mg by mouth daily.  Marland Kitchen diltiazem (CARDIZEM CD) 300 MG 24 hr capsule Take 1 capsule (300 mg total) by mouth daily.  . hydrochlorothiazide (HYDRODIURIL) 25 MG tablet Take 25 mg by mouth daily.  . Insulin Aspart (NOVOLOG [place]) Inject 42 Units into the skin 3 (three) times daily.  . metFORMIN (GLUCOPHAGE) 1000 MG tablet Take 2,000 mg by mouth daily with supper.   Marland Kitchen omeprazole (PRILOSEC) 20 MG capsule Take 20 mg by mouth daily.  . ramipril (ALTACE) 10 MG capsule Take 10 mg by mouth 2 (two) times daily.  Marland Kitchen atorvastatin (LIPITOR) 40 MG tablet Take 40 mg by mouth daily.   No facility-administered encounter medications on file as of 03/23/2017.      Review of Systems  Constitutional:   No  weight loss, night sweats,  Fevers, chills, +fatigue, or  lassitude.  HEENT:   No headaches,  Difficulty swallowing,  Tooth/dental problems, or  Sore throat,                No sneezing, itching, ear ache, nasal congestion, post nasal drip,   CV:  No chest pain,  Orthopnea, PND, swelling in lower extremities, anasarca, dizziness, palpitations, syncope.   GI  No heartburn, indigestion, abdominal pain, nausea, vomiting, diarrhea, change in bowel habits, loss of appetite, bloody stools.   Resp: No shortness of breath with exertion or at rest.  No excess mucus, no productive cough,  No non-productive cough,  No coughing up of blood.  No change in color of mucus.  No wheezing.  No chest wall deformity  Skin: no rash or lesions.  GU: no dysuria, change in color of urine, no urgency or frequency.  No flank pain, no hematuria   MS:  +knee and back pain    Physical Exam  BP 122/68 (BP Location: Left Arm, Cuff Size: Normal)   Pulse 84   Ht 5\' 7"  (1.702 m)   Wt 192 lb 6.4 oz (87.3 kg)   BMI 30.13 kg/m   GEN: A/Ox3; pleasant , NAD, obese    HEENT:  [place]/AT,  EACs-clear, TMs-wnl, NOSE-clear,  THROAT-clear, no lesions, no postnasal drip or exudate noted.   NECK:  Supple w/ fair ROM; no JVD; normal carotid impulses w/o bruits; no thyromegaly or nodules palpated; no lymphadenopathy.    RESP  Clear  P & A; w/o, wheezes/ rales/ or rhonchi. no accessory muscle use, no dullness to percussion  CARD:  RRR, no m/r/g, no peripheral edema, pulses intact, no cyanosis or clubbing.  GI:   Soft & nt; nml bowel sounds; no organomegaly or masses detected.   Musco: Warm bil, no deformities or joint swelling noted.   Neuro: alert, no focal deficits noted.    Skin: Warm, no lesions or rashes   Lab Results:  CBC  Imaging: No results found.   Assessment & Plan:   SOB (shortness of breath)  Dyspnea ? Etiology  Workup thus far has been unrevealing with PFT showing Moderate Restriction. No airflow obstruction . There was no ILD on CT chest .  Previous cardiac workup with neg myoview in 2017 . Echo with preserved EF. BNP normal . No reported anemia . She is overweight and appears deconditioned (chronic knee pain so unable to exercise) . CPST points to deconditioning and obesity .  Advised to advance act as toelrated follow up in 3-4 month and and As needed   Please contact office for sooner follow up if symptoms do not improve or worsen or seek emergency care       Rexene Edison, NP 03/23/2017

## 2017-03-23 NOTE — Patient Instructions (Signed)
Advance activity as tolerated.  See Ortho doctor regarding your knee pain.  follow up Dr. Chase Caller in 3-4 months and As needed   Please contact office for sooner follow up if symptoms do not improve or worsen or seek emergency care

## 2017-03-23 NOTE — Assessment & Plan Note (Signed)
Dyspnea ? Etiology  Workup thus far has been unrevealing with PFT showing Moderate Restriction. No airflow obstruction . There was no ILD on CT chest .  Previous cardiac workup with neg myoview in 2017 . Echo with preserved EF. BNP normal . No reported anemia . She is overweight and appears deconditioned (chronic knee pain so unable to exercise) . CPST points to deconditioning and obesity .  Advised to advance act as toelrated follow up in 3-4 month and and As needed   Please contact office for sooner follow up if symptoms do not improve or worsen or seek emergency care

## 2017-03-29 ENCOUNTER — Encounter: Payer: Self-pay | Admitting: Podiatry

## 2017-03-29 ENCOUNTER — Ambulatory Visit (INDEPENDENT_AMBULATORY_CARE_PROVIDER_SITE_OTHER): Payer: Medicare Other | Admitting: Podiatry

## 2017-03-29 VITALS — BP 154/77 | HR 86

## 2017-03-29 DIAGNOSIS — M79676 Pain in unspecified toe(s): Secondary | ICD-10-CM

## 2017-03-29 DIAGNOSIS — B351 Tinea unguium: Secondary | ICD-10-CM | POA: Diagnosis not present

## 2017-03-29 DIAGNOSIS — E119 Type 2 diabetes mellitus without complications: Secondary | ICD-10-CM | POA: Diagnosis not present

## 2017-03-29 NOTE — Progress Notes (Signed)
   Subjective:    Patient ID: Alice Cole, female    DOB: [birth year], 71 y.o.   MRN: [id number]  HPI this patient presents the office with chief complaint of long thick painful nails. She says her nails are painful walking and wearing her shoes.  She says she has been unable to trim her nails herself and they have grown very long. This patient is diabetic. She presents the office today for an evaluation of her diabetic feet and treatment of her long painful nails    Review of Systems  All other systems reviewed and are negative.      Objective:   Physical Exam GENERAL APPEARANCE: Alert, conversant. Appropriately groomed. No acute distress.  VASCULAR: Pedal pulses are  palpable at  [facility] and PT bilateral.  Capillary refill time is immediate to all digits,  Normal temperature gradient.  Digital hair growth is present bilateral  NEUROLOGIC: sensation is normal to 5.07 monofilament at 5/5 sites bilateral.  Light touch is intact bilateral, Muscle strength normal.  MUSCULOSKELETAL: acceptable muscle strength, tone and stability bilateral.  HAV  B/L NAILS  thick disfigured discolored nails with subungual debris noted 10. No evidence of bacterial infection or drainage noted DERMATOLOGIC: skin color, texture, and turgor are within normal limits.  No preulcerative lesions or ulcers  are seen, no interdigital maceration noted.  No open lesions present.   No drainage noted.         Assessment & Plan:  Onychomycoses  diabetes with no complications  Initial exam.  Debridement of nails.  Return to clinic in 3 months for further evaluation and treatment   Gardiner Barefoot DPM

## 2017-04-20 DIAGNOSIS — Z794 Long term (current) use of insulin: Secondary | ICD-10-CM | POA: Diagnosis not present

## 2017-04-20 DIAGNOSIS — M8580 Other specified disorders of bone density and structure, unspecified site: Secondary | ICD-10-CM | POA: Diagnosis not present

## 2017-04-20 DIAGNOSIS — Z5181 Encounter for therapeutic drug level monitoring: Secondary | ICD-10-CM | POA: Diagnosis not present

## 2017-04-20 DIAGNOSIS — E1165 Type 2 diabetes mellitus with hyperglycemia: Secondary | ICD-10-CM | POA: Diagnosis not present

## 2017-04-26 ENCOUNTER — Encounter: Payer: Medicare Other | Attending: Family Medicine | Admitting: *Deleted

## 2017-04-26 DIAGNOSIS — E119 Type 2 diabetes mellitus without complications: Secondary | ICD-10-CM | POA: Insufficient documentation

## 2017-04-26 DIAGNOSIS — Z713 Dietary counseling and surveillance: Secondary | ICD-10-CM | POA: Diagnosis not present

## 2017-04-26 NOTE — Patient Instructions (Signed)
Plan:  Aim for 3 Carb Choices per meal (45 grams) +/- 1 either way  Aim for 0-2 Carbs per snack if hungry  Include protein in moderation with your meals and snacks Consider  increasing your activity level for a few minutes daily as tolerated Consider checking BG at alternate times per day   Continue taking medication as directed by MD

## 2017-04-29 NOTE — Progress Notes (Signed)
Diabetes Self-Management Education  Visit Type: First/Initial  Appt. Start Time: 1545 Appt. End Time: 6213  04/29/2017  [name], identified by name and date of birth, is a 71 y.o. female with a diagnosis of Diabetes: Type 2. Patient states she lives with husband and 2 grown grand children. She cleans house and prepares meals for whole family with little help. She cooks supper every other night and has leftovers in between.   ASSESSMENT  Height 5\' 7"  (1.702 m), weight 193 lb 8 oz (87.8 kg). Body mass index is 30.31 kg/m.      Diabetes Self-Management Education - 04/26/17 1550      Visit Information   Visit Type First/Initial     Initial Visit   Diabetes Type Type 2   Are you currently following a meal plan? No   Are you taking your medications as prescribed? Yes   Date Diagnosed 1996     Health Coping   How would you rate your overall health? Good     Psychosocial Assessment   Patient Belief/Attitude about Diabetes Defeat/Burnout  I hate it   Other persons present Patient   Patient Concerns Nutrition/Meal planning   How often do you need to have someone help you when you read instructions, pamphlets, or other written materials from your doctor or pharmacy? 1 - Never   What is the last grade level you completed in school? 10th     Pre-Education Assessment   Patient understands the diabetes disease and treatment process. Needs Instruction   Patient understands incorporating nutritional management into lifestyle. Needs Instruction   Patient undertands incorporating physical activity into lifestyle. Needs Instruction   Patient understands using medications safely. Needs Review   Patient understands monitoring blood glucose, interpreting and using results Needs Review     Complications   Last HgB A1C per patient/outside source 8.1 %   How often do you check your blood sugar? 3-4 times/day   Have you had a dilated eye exam in the past 12 months? Yes   Have you had a  dental exam in the past 12 months? No   Are you checking your feet? No     Dietary Intake   Breakfast varies every day; left overs from supper OR egg and toast OR sandwich   Snack (morning) not usually   Lunch may eat out for lunch OR sandwich at home, occasionally with chips or small banana   Snack (afternoon) potato chips occasionally   Dinner every other night a cooked meal of meat, potato and a vegetable or 2. The other nights something easy like pizza   Snack (evening) 1-2 scoops orange sherbet ice cream occasionally   Beverage(s) water, diet tea     Exercise   Exercise Type ADL's  she does all of the house work and cooking for 4 people     Patient Education   Previous Diabetes Education Yes (please comment)  15 years ago   Disease state  Definition of diabetes, type 1 and 2, and the diagnosis of diabetes   Nutrition management  Role of diet in the treatment of diabetes and the relationship between the three main macronutrients and blood glucose level;Food label reading, portion sizes and measuring food.;Carbohydrate counting   Physical activity and exercise  Role of exercise on diabetes management, blood pressure control and cardiac health.   Medications Reviewed patients medication for diabetes, action, purpose, timing of dose and side effects.   Monitoring Purpose and frequency of SMBG.;Identified appropriate  SMBG and/or A1C goals.   Chronic complications Relationship between chronic complications and blood glucose control   Psychosocial adjustment Role of stress on diabetes     Individualized Goals (developed by patient)   Nutrition Follow meal plan discussed;General guidelines for healthy choices and portions discussed   Physical Activity Exercise 3-5 times per week   Monitoring  test my blood glucose as discussed   Reducing Risk examine blood glucose patterns     Outcomes   Expected Outcomes Demonstrated interest in learning. Expect positive outcomes   Future DMSE 3-4  months   Program Status Completed      Individualized Plan for Diabetes Self-Management Training:   Learning Objective:  Patient will have a greater understanding of diabetes self-management. Patient education plan is to attend individual and/or group sessions per assessed needs and concerns.   Plan:   Patient Instructions  Plan:  Aim for 3 Carb Choices per meal (45 grams) +/- 1 either way  Aim for 0-2 Carbs per snack if hungry  Include protein in moderation with your meals and snacks Consider  increasing your activity level for a few minutes daily as tolerated Consider checking BG at alternate times per day   Continue taking medication as directed by MD  Expected Outcomes:  Demonstrated interest in learning. Expect positive outcomes  Education material provided: Living Well with Diabetes and Meal plan card, insulin action handout  If problems or questions, patient to contact team via:  Phone  Future DSME appointment: 3-4 months

## 2017-06-21 DIAGNOSIS — K5792 Diverticulitis of intestine, part unspecified, without perforation or abscess without bleeding: Secondary | ICD-10-CM | POA: Diagnosis not present

## 2017-06-21 DIAGNOSIS — Z23 Encounter for immunization: Secondary | ICD-10-CM | POA: Diagnosis not present

## 2017-06-27 ENCOUNTER — Ambulatory Visit (INDEPENDENT_AMBULATORY_CARE_PROVIDER_SITE_OTHER): Payer: Medicare Other | Admitting: Internal Medicine

## 2017-06-27 ENCOUNTER — Encounter: Payer: Self-pay | Admitting: Internal Medicine

## 2017-06-27 VITALS — BP 142/78 | HR 81 | Ht 67.0 in | Wt 194.8 lb

## 2017-06-27 DIAGNOSIS — R0989 Other specified symptoms and signs involving the circulatory and respiratory systems: Secondary | ICD-10-CM

## 2017-06-27 DIAGNOSIS — I519 Heart disease, unspecified: Secondary | ICD-10-CM | POA: Diagnosis not present

## 2017-06-27 DIAGNOSIS — I5189 Other ill-defined heart diseases: Secondary | ICD-10-CM

## 2017-06-27 DIAGNOSIS — R0602 Shortness of breath: Secondary | ICD-10-CM | POA: Diagnosis not present

## 2017-06-27 DIAGNOSIS — R5381 Other malaise: Secondary | ICD-10-CM | POA: Diagnosis not present

## 2017-06-27 NOTE — Patient Instructions (Addendum)
ICD-10-CM   1. SOB (shortness of breath) R06.02   2. Physical deconditioning R53.81   3. Diastolic dysfunction U98.1   4. Pulmonary air trapping R09.89     CPST suggests high blood pressure, physical lack of fitness, weight and stiff heart muscle as reason for shortness of breath  Plan - refer pulmonary rehab  - conitnue dietary weight loss through dietician  - work with cardiology or Melvyn Novas, Arbie Cookey, MD to improve bP conntrol - if these do not work we can try inhaler for pulmonary air trapping (based on our discussion)  Followup 6 months or sooner if needed

## 2017-06-27 NOTE — Addendum Note (Signed)
Addended by: Lorretta Harp on: 06/27/2017 10:55 AM   Modules accepted: Orders

## 2017-06-27 NOTE — Progress Notes (Signed)
Subjective:     Patient ID: Alice Cole, female   DOB: July 22, 1946, 71 y.o.   MRN: [id number]  HPI   PCP Maurice Small, MD   HPI  IOV 02/16/2017  Chief Complaint  Patient presents with  . Pulmonary Consult    Pt referred by Dr. Fransico Him for abnormal PFT. Pt c/o DOE. Pt denies CP/tightness, cough and f/c/s.    71 year old obese female referred by Dr. Radford Pax of cardiology for dyspnea. Patient does be that she's been dyspneic for 1 year. Insidious onset. Stable since onset not progressive. A year ago she could do also work and walking around the yard and outside the house without any problems but currently has to stop and take rest. This no associated chest pain or cough or wheezing or orthopnea paroxysmal nocturnal dyspnea. Symptoms onset correlate with her stopping regular exercise. There is no change in weight gain. This no other clear cut aggravating or relieving factors. Review of the chart shows that she has obstructive sleep apnea but she's not compliant with CPAP. She has had previous cardiac stress test that was normal. Echo ejection fraction is normal. CT chest lung cut in the last 3 years looks normal on  personal visualization of the image. Dr. Radford Pax on office visit 12/21/2016 suspected obesity and diastolic dysfunction as a reason for dyspnea but then showed a pulmonary function test reflected below and images that I personally visualized shows restrictive defect with low diffusion capacity and therefore patient has been referred here. It is noted that she is on ACE inhibitor's but she does not have any cough.    General likes lambda G SK allergy starting018 09:06  Ref. Range 01/12/2017 11:51  FVC-Pre Latest Units: L 2.16  FVC-%Pred-Pre Latest Units: % 64  FEV1-Pre Latest Units: L 1.83  FEV1-%Pred-Pre Latest Units: % 72  Pre FEV1/FVC ratio Latest Units: % 85   Results for Alice Cole, Alice Cole (MRN [id number]) as of 02/16/2017 09:06  Ref. Range 01/12/2017 11:51  DLCO cor Latest Units:  ml/min/mmHg 18.48  DLCO cor %Change weekly Talk about pred Latest Units: % 65  Results for Alice Cole, Alice Cole (MRN [id number]) as of 02/16/2017 09:06  Ref. Range 01/20/2016 11:53 12/21/2016 11:05  Creatinine Latest Ref Range: 0.57 - 1.00 mg/dL  0.95  Results for Alice Cole, Alice Cole (MRN [id number]) as of 02/16/2017 09:06  Ref. Range 01/20/2016 11:53 12/21/2016 11:05  Brain Natriuretic Peptide Latest Ref Range: <100 pg/mL 50.4     Myocardial perfusion test 01/14/16 - normal  Abdominal CT 06/14/2014 lung cut: Clear lung fields    OV 06/27/2017  Chief Complaint  Patient presents with  . Follow-up    Pt states that when talking on the phone she becomes SOB and has to take a minute to get breath. Also states that she wakes up in the middle of the night SOB. C/o occ dry to prod. cough with clear mucus that started last Wednesday, 06/22/17.    Dyspnea follow up here to discuss test results. CT scan did not show interstitial lung disease but has pulmonary air trapping. Cardiovascular stress test suggests hypertensive response obesity and diastolic dysfunction on the 9 panel plot is reason for dyspnea    IMPRESSION: HRCT 1. No evidence of interstitial lung disease. 2. No acute findings are noted in the thorax. 3. Moderate air trapping indicative of small airways disease. 4. Two tiny 2-3 mm pulmonary nodules in the apex of the right upper lobe, nonspecific, but statistically likely benign. No follow-up needed  if patient is low-risk (and has no known or suspected primary neoplasm). Non-contrast chest CT can be considered in 12 months if patient is high-risk. This recommendation follows the consensus statement: Guidelines for Management of Incidental Pulmonary Nodules Detected on CT Images: From the Fleischner Society 2017; Radiology 2017; 284:228-243. 5. Aortic atherosclerosis, in addition to left main and 3 vessel coronary artery disease. Please note that although the presence of coronary artery calcium  documents the presence of coronary artery disease, the severity of this disease and any potential stenosis cannot be assessed on this non-gated CT examination. Assessment for potential risk factor modification, dietary therapy or pharmacologic therapy may be warranted, if clinically indicated. 6. Hepatic steatosis. 7. There are mild calcifications of the aortic valve. Echocardiographic correlation for evaluation of potential valvular dysfunction may be warranted if clinically indicated.   Electronically Signed   By: Vinnie Langton M.D.   On: 02/17/2017 08:56    Notes: Patient gave a very good effort. She had difficulty with stiffness in the right knee which relieved with ROM until peak exercise. Pulse-oximetry remained 97-98% for the duration of exercise. Exercise was performed on a cycle ergometer starting at [facility] and increasing by 10W/min.  ECG: Resting ECG in normal sinus rhythm with low voltage QRS, left axis deviation and anterior q waves. HR response mildly blunted (consider degree of submaximal exercise). There were no sustained arrhythmias and no ST-T changes. BP mildly hypertensive at rest with increasing hypertensive response to exercise.  PFT: Pre-exercise spirometry was within normal limits. The MVV was normal. Post-exercise spirometry was within normal limits.  CPX: Exercise Capacity- Exercise testing with gas exchange  demonstrates a low-normal peak VO2 of 13.5 ml/kg/min (83% of the age/gender/weight matched sedentary norms). The RER of 1.11 indicates a maximal effort. When adjusted to the patient's ideal body weight of 149.5 lb (67.8 kg) the peak VO2 is 17.1 ml/kg (ibw)/min (96% of the ibw-adjusted predicted).   Cardiovascular response- The O2pulse (a surrogate for stroke volume) increased with initial exercise, however remained mostly flat reaching 8 ml/beat at peak (89% predicted).  Ventilatory response- The VE/VCO2 slope is elevated and indicates Increased  dead space ventilation. The oxygen uptake efficiency slope (OUES) is mildly reduced. The VO2 at the ventilatory threshold was normal at 58% of the predicted peak VO2. At peak exercise, the ventilation reached 45% of the measured MVV and breathing reserve was 44 indicating ventilatory reserve remained. PETCO2 was low at 32 mmHg during peak exercise.   CPST Conclusion: Exercise testing with gas exchange demonstrates low-normal functional capacity when compared to matched sedentary norms. There is no indication of pulmonary limitation or exercise-induced bronchospasm. Given elevated VE/VCO2 and early plateau of O2 pulse, circulatory limitations cannot be excluded. At peak exercise, with adjustments to predicted weight values, patient appears primarily limited due to her body habitus and deconditioning.   Test, report and preliminary impression by: Landis Martins, MS, ACSM-RCEP 03/22/2017 9:45 AM  FINALIZED Marshell Garfinkel MD [facility] 03/23/2017, 11:32 AM    has a past medical history of Abnormal EKG (07/09/2016); Abnormal liver function; Adenomatous colon polyp; Bigeminy; Chest pain; Colonic polyp; Dermatitis; Diabetes mellitus; Diverticulitis; DVT (deep venous thrombosis) ([place]); Esophageal stricture; GERD (gastroesophageal reflux disease); Heart palpitations; Hemorrhoids; Hyperlipidemia; Hypertension; Obstructive sleep apnea on CPAP; OSA (obstructive sleep apnea) (07/09/2016); and PVC's (premature ventricular contractions).   reports that she has never smoked. She has never used smokeless tobacco.  Past Surgical History:  Procedure Laterality Date  . ABDOMINAL HYSTERECTOMY    .  CHOLECYSTECTOMY     aprox 10 years ago  . EYE SURGERY     right and left eye  . KNEE SURGERY     Right  . KNEE SURGERY     Left    Allergies  Allergen Reactions  . Metoprolol Anaphylaxis  . Nortriptyline Anaphylaxis  . Olmesartan Anaphylaxis  . Tradjenta [Linagliptin] Shortness Of  Breath  . Alatrofloxacin Dermatitis  . Benicar [Olmesartan Medoxomil] Nausea Only and Other (See Comments)    Urinary difficulty  . Beta Adrenergic Blockers Other (See Comments)    Reaction: Low Heart Rate  . Cartia Xt [Diltiazem] Itching  . Cephalexin Nausea And Vomiting    REACTION: nausea, vomiting, diarrhea  . Codeine Other (See Comments)    tachycardia  . Colesevelam Nausea Only  . Crestor [Rosuvastatin Calcium] Other (See Comments)    Leg cramps  . Flagyl [Metronidazole] Nausea Only  . Fluvastatin Sodium Itching  . Levemir [Insulin Detemir] Itching and Other (See Comments)    Bad mood  . Lisinopril Nausea Only  . Metoprolol Succinate Other (See Comments)    Low heart rate  . Nortriptyline Hcl Other (See Comments)    Reaction unknown  . Pamelor [Nortriptyline Hcl] Other (See Comments)    Nightmares  . Rosuvastatin Other (See Comments)    REACTION: cramps  . Sulfamethoxazole-Trimethoprim Nausea Only and Other (See Comments)    REACTION: nausea, irreg. heartrate  . Verapamil Swelling and Other (See Comments)    Chest pain  . Welchol [Colesevelam Hcl] Nausea And Vomiting  . Adhesive [Tape] Rash    Band Aids  . Sitagliptin Rash  . Trovan [Trovafloxacin] Rash and Other (See Comments)    Immunization History  Administered Date(s) Administered  . Influenza Whole 07/11/2009  . Influenza, High Dose Seasonal PF 06/21/2017  . Influenza,inj,Quad PF,6+ Mos 06/11/2016  . Pneumococcal Conjugate-13 10/11/2013  . Pneumococcal Polysaccharide-23 10/12/2011    Family History  Problem Relation Age of Onset  . Heart attack Mother 1  . Heart attack Brother 54  . Heart attack Maternal Grandmother 67  . Heart attack Maternal Grandfather 63  . Cancer Daughter        Ovarian Cancer  . Diabetes Other        Grandparent  . Heart disease Other        Grandparent     Current Outpatient Prescriptions:  .  aspirin 325 MG tablet, Take 325 mg by mouth daily., Disp: , Rfl:  .   diltiazem (CARDIZEM CD) 300 MG 24 hr capsule, Take 1 capsule (300 mg total) by mouth daily., Disp: 30 capsule, Rfl: 11 .  hydrochlorothiazide (HYDRODIURIL) 25 MG tablet, Take 25 mg by mouth daily., Disp: , Rfl:  .  Insulin Aspart (NOVOLOG [place]), Inject 42 Units into the skin 3 (three) times daily., Disp: , Rfl:  .  insulin NPH Human (HUMULIN N,NOVOLIN N) 100 UNIT/ML injection, Inject 30 Units into the skin at bedtime., Disp: , Rfl:  .  metFORMIN (GLUCOPHAGE-XR) 500 MG 24 hr tablet, TAKE 4 TABLETS EVERY DAY WITH EVENING MEAL, Disp: , Rfl: 5 .  omeprazole (PRILOSEC) 20 MG capsule, Take 20 mg by mouth daily., Disp: , Rfl:  .  pravastatin (PRAVACHOL) 40 MG tablet, Take 40 mg by mouth daily., Disp: , Rfl: 11 .  ramipril (ALTACE) 10 MG capsule, Take 10 mg by mouth 2 (two) times daily., Disp: , Rfl:    Review of Systems     Objective:   Physical Exam  Vitals:  06/27/17 1020  BP: (!) 142/78  Pulse: 81  SpO2: 96%  Weight: 194 lb 12.8 oz (88.4 kg)  Height: 5\' 7"  (1.702 m)    Estimated body mass index is 30.51 kg/m as calculated from the following:   Height as of this encounter: 5\' 7"  (1.702 m).   Weight as of this encounter: 194 lb 12.8 oz (88.4 kg).  Discussion only    Assessment:       ICD-10-CM   1. SOB (shortness of breath) R06.02   2. Physical deconditioning R53.81   3. Diastolic dysfunction O11.8   4. Pulmonary air trapping R09.89        Plan:      CPST suggests high blood pressure, physical lack of fitness, weight and stiff heart muscle as reason for shortness of breath  Plan - refer pulmonary rehab  - conitnue dietary weight loss through dietician  - work with cardiology or Melvyn Novas, Arbie Cookey, MD to improve bP conntrol - if these do not work we can try inhaler for pulmonary air trapping (based on our discussion)  Followup 6 months or sooner if needed    (> 50% of this 15 min visit spent in face to face counseling or/and coordination of care)   Dr. Brand Males, M.D., [facility].C.P Pulmonary and Critical Care Medicine Staff Physician [facility] Pager: [phone], If no answer or between  15:00h - 7:00h: call [phone]  06/27/2017 10:48 AM

## 2017-06-28 ENCOUNTER — Ambulatory Visit: Payer: Medicare Other | Admitting: Podiatry

## 2017-07-04 DIAGNOSIS — S46811A Strain of other muscles, fascia and tendons at shoulder and upper arm level, right arm, initial encounter: Secondary | ICD-10-CM | POA: Diagnosis not present

## 2017-07-13 ENCOUNTER — Encounter: Payer: Self-pay | Admitting: Podiatry

## 2017-07-13 ENCOUNTER — Ambulatory Visit (INDEPENDENT_AMBULATORY_CARE_PROVIDER_SITE_OTHER): Payer: Medicare Other | Admitting: Podiatry

## 2017-07-13 DIAGNOSIS — M79676 Pain in unspecified toe(s): Secondary | ICD-10-CM

## 2017-07-13 DIAGNOSIS — B351 Tinea unguium: Secondary | ICD-10-CM

## 2017-07-13 DIAGNOSIS — E119 Type 2 diabetes mellitus without complications: Secondary | ICD-10-CM

## 2017-07-13 NOTE — Progress Notes (Addendum)
Complaint:  Visit Type: Patient returns to my office for continued preventative foot care services. Complaint: Patient states" my nails have grown long and thick and become painful to walk and wear shoes" Patient has been diagnosed with DM with no foot complications. The patient presents for preventative foot care services. No changes to ROS  Podiatric Exam: Vascular: dorsalis pedis and posterior tibial pulses are palpable bilateral. Capillary return is immediate. Temperature gradient is WNL. Skin turgor WNL  Sensorium: Normal Semmes Weinstein monofilament test. Normal tactile sensation bilaterally. Nail Exam: Pt has thick disfigured discolored nails with subungual debris noted bilateral entire nail hallux through fifth toenails Ulcer Exam: There is no evidence of ulcer or pre-ulcerative changes or infection. Orthopedic Exam: Muscle tone and strength are WNL. No limitations in general ROM. No crepitus or effusions noted. Foot type and digits show no abnormalities. Bony prominences are unremarkable. Skin: No Porokeratosis. No infection or ulcers  Diagnosis:  Onychomycosis, , Pain in right toe, pain in left toes  Diabetes  Treatment & Plan Procedures and Treatment: Consent by patient was obtained for treatment procedures.   Debridement of mycotic and hypertrophic toenails, 1 through 5 bilateral and clearing of subungual debris. No ulceration, no infection noted. Signed ABN for 2018. Return Visit-Office Procedure: Patient instructed to return to the office for a follow up visit 3 months for continued evaluation and treatment.    Gardiner Barefoot DPM

## 2017-07-27 ENCOUNTER — Ambulatory Visit: Payer: Medicare Other | Admitting: *Deleted

## 2017-07-27 DIAGNOSIS — Z5181 Encounter for therapeutic drug level monitoring: Secondary | ICD-10-CM | POA: Diagnosis not present

## 2017-07-27 DIAGNOSIS — Z79899 Other long term (current) drug therapy: Secondary | ICD-10-CM | POA: Diagnosis not present

## 2017-07-27 DIAGNOSIS — E1165 Type 2 diabetes mellitus with hyperglycemia: Secondary | ICD-10-CM | POA: Diagnosis not present

## 2017-07-27 DIAGNOSIS — Z794 Long term (current) use of insulin: Secondary | ICD-10-CM | POA: Diagnosis not present

## 2017-07-27 DIAGNOSIS — M8580 Other specified disorders of bone density and structure, unspecified site: Secondary | ICD-10-CM | POA: Diagnosis not present

## 2017-08-04 ENCOUNTER — Ambulatory Visit: Payer: Medicare Other | Admitting: Cardiology

## 2017-08-05 ENCOUNTER — Encounter (HOSPITAL_COMMUNITY): Payer: Self-pay

## 2017-08-05 ENCOUNTER — Encounter (HOSPITAL_COMMUNITY)
Admission: RE | Admit: 2017-08-05 | Discharge: 2017-08-05 | Disposition: A | Discharge status: Home / Self Care | Payer: Medicare Other | Source: Ambulatory Visit | Attending: Internal Medicine | Admitting: Internal Medicine

## 2017-08-05 VITALS — BP 163/75 | HR 87 | Resp 18 | Ht 67.0 in | Wt 197.5 lb

## 2017-08-05 DIAGNOSIS — R0602 Shortness of breath: Secondary | ICD-10-CM | POA: Diagnosis not present

## 2017-08-05 DIAGNOSIS — R5381 Other malaise: Secondary | ICD-10-CM | POA: Diagnosis not present

## 2017-08-05 NOTE — Progress Notes (Signed)
Alice Cole 71 y.o. female Pulmonary Rehab Orientation Note Patient arrived today in Cardiac and Pulmonary Rehab for orientation to Pulmonary Rehab. She was transported from [employer] via wheel chair. She does not carry portable oxygen and has not been prescribed oxygen for home use. She has been prescribed a CPAP but does not wear. Color good, skin warm and dry. Patient is oriented to time and place. Patient's medical history, psychosocial health, and medications reviewed. Psychosocial assessment reveals pt lives with their family. Pt is currently retired. Pt hobbies include spending as much time with her family as she can. 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 He is 71 years old. Pt reports her stress level is moderate. Areas of stress/anxiety include Family. Her daughter lives in a [place]" and has an addiction to drugs and alcohol.  Pt does not exhibit signs of depression. PHQ2/9 score 0/na. Pt shows good  coping skills with positive outlook. She is offered emotional support and reassurance. She wants to make sure she remains healthy to see her "son" graduate from college and marry. Will continue to monitor and evaluate progress toward psychosocial goal(s) of remaining positive about her ability to remain living until her son is independent and on his on. Physical assessment reveals heart rate is normal, breath sounds clear to auscultation, no wheezes, rales, or rhonchi. Grip strength equal, strong. Distal pulses palpable. Patient reports she does take medications as prescribed. Patient states she follows a Diabetic diet. The patient has been trying to lose weight through a healthy diet and exercise program.. Patient's weight will be monitored closely. Demonstration and practice of PLB using pulse oximeter. Patient able to return demonstration satisfactorily. Safety and hand hygiene in the exercise area reviewed with patient. Patient  voices understanding of the information reviewed. Department expectations discussed with patient and achievable goals were set. The patient shows enthusiasm about attending the program and we look forward to working with this nice lady. The patient is scheduled for a 6 min walk test on 08/09/17 and to begin exercise on 08/16/17 at 1030.   45 minutes was spent on a variety of activities such as assessment of the patient, obtaining baseline data including height, weight, BMI, and grip strength, verifying medical history, allergies, and current medications, and teaching patient strategies for performing tasks with less respiratory effort with emphasis on pursed lip breathing.

## 2017-08-09 ENCOUNTER — Encounter (HOSPITAL_COMMUNITY)
Admission: RE | Admit: 2017-08-09 | Discharge: 2017-08-09 | Disposition: A | Discharge status: Home / Self Care | Payer: Medicare Other | Source: Ambulatory Visit | Attending: Internal Medicine | Admitting: Internal Medicine

## 2017-08-09 DIAGNOSIS — R5381 Other malaise: Secondary | ICD-10-CM | POA: Diagnosis not present

## 2017-08-09 DIAGNOSIS — R0602 Shortness of breath: Secondary | ICD-10-CM | POA: Diagnosis not present

## 2017-08-10 ENCOUNTER — Ambulatory Visit: Payer: Medicare Other | Admitting: *Deleted

## 2017-08-11 NOTE — Progress Notes (Signed)
Pulmonary Individual Treatment Plan  Patient Details  Name: Alice Cole MRN: [id number] Date of Birth: 12-14-1945 Referring Provider:     Pulmonary Rehab Walk Test from 08/09/2017 in [place]  Referring Provider  Dr. Chase Caller      Initial Encounter Date:    Pulmonary Rehab Walk Test from 08/09/2017 in [place]  Date  08/09/17  Referring Provider  Dr. Chase Caller      Visit Diagnosis: Shortness of breath  Patient's Home Medications on Admission:   Current Outpatient Prescriptions:  .  aspirin 325 MG tablet, Take 325 mg by mouth daily., Disp: , Rfl:  .  diltiazem (CARDIZEM CD) 300 MG 24 hr capsule, Take 1 capsule (300 mg total) by mouth daily., Disp: 30 capsule, Rfl: 11 .  hydrochlorothiazide (HYDRODIURIL) 25 MG tablet, Take 25 mg by mouth daily., Disp: , Rfl:  .  Insulin Aspart (NOVOLOG S[place]ent), Inject 42 Units into the skin 3 (three) times daily. 42 units morning, 42 units lunch, 54 units supper, Disp: , Rfl:  .  metFORMIN (GLUCOPHAGE-XR) 500 MG 24 hr tablet, TAKE 4 TABLETS EVERY DAY WITH EVENING MEAL, Disp: , Rfl: 5 .  NOVOLOG FLEXPEN 100 UNIT/ML FlexPen, , Disp: , Rfl:  .  omeprazole (PRILOSEC) 20 MG capsule, Take 20 mg by mouth daily., Disp: , Rfl:  .  pravastatin (PRAVACHOL) 40 MG tablet, Take 40 mg by mouth daily., Disp: , Rfl: 11 .  ramipril (ALTACE) 10 MG capsule, Take 10 mg by mouth 2 (two) times daily., Disp: , Rfl:  .  tiZANidine (ZANAFLEX) 4 MG tablet, TAKE 1 TABLET BY MOUTH AT BEDTIME AS NEEDED FOR MUSCLE SPASM, Disp: , Rfl: 1 .  traMADol (ULTRAM) 50 MG tablet, TAKE 1 TO 2 TABLETS BY MOUTH TWICE A DAY AS NEEDED FOR SEVERE PAIN, Disp: , Rfl: 1  Past Medical History: Past Medical History:  Diagnosis Date  . Abnormal EKG 07/09/2016  . Abnormal liver function   . Adenomatous colon polyp   . Bigeminy   . Chest pain   . Colonic polyp   . Dermatitis   . Diabetes mellitus   . Diverticulitis    recurrent   . DVT (deep venous thrombosis) ([place])   . Esophageal stricture   . GERD (gastroesophageal reflux disease)   . Heart palpitations   . Hemorrhoids   . Hyperlipidemia   . Hypertension   . Obstructive sleep apnea on CPAP    pt does not use  . OSA (obstructive sleep apnea) 07/09/2016   She has not been using her CPAP device because she needs new supplies  . PVC's (premature ventricular contractions)     Tobacco Use: History  Smoking Status  . Never Smoker  Smokeless Tobacco  . Never Used    Labs: Recent Review Flowsheet Data    Labs for ITP Cardiac and Pulmonary Rehab Latest Ref Rng & Units 11/27/2009 12/06/2009 08/25/2010 03/21/2012 12/20/2012   Cholestrol 0 - 200 mg/dL 265 ATP III CLASSIFICATION: <200     mg/dL   Desirable 200-239  mg/dL   Borderline High >=240    mg/dL   High(H) - 194 ATP III CLASSIFICATION: <200     mg/dL   Desirable 200-239  mg/dL   Borderline High >=240    mg/dL   High - -   LDLCALC 0 - 99 mg/dL 181 Total Cholesterol/HDL:CHD Risk Coronary Heart Disease Risk Table Men   Women 1/2 Average Risk   3.4  3.3 Average Risk       5.0   4.4 2 X Average Risk   9.6   7.1 3 X Average Risk  23.4   11.0 Use the calculated Patient Ratio above and the CHD Risk Table to determine the patient's CHD Risk. ATP III CLASSIFICATION (LDL): <100     mg/dL   Optimal 100-129  mg/dL   Near or Above Optimal 130-159  mg/dL   Borderline 160-189  mg/dL   High >190     mg/dL   Very High(H) - 121 Total Cholesterol/HDL:CHD Risk Coronary Heart Disease Risk Table Men   Women 1/2 Average Risk   3.4   3.3 Average Risk       5.0   4.4 2 X Average Risk   9.6   7.1 3 X Average Risk  23.4   11.0 Use the calculated Patient Ratio above and the CHD Risk Table to determine the patient's CHD Risk. ATP III CLASSIFICATION (LDL): <100     mg/dL   Optimal 100-129  mg/dL   Near or Above Optimal 130-159  mg/dL   Borderline 160-189  mg/dL   High >190     mg/dL   Very High(H) - -   HDL >39  mg/dL 35(L) - 38(L) - -   Trlycerides <150 mg/dL 244(H) - 175(H) - -   Hemoglobin A1c 4.6 - 6.5 % - - - - 8.6(H)   TCO2 0 - 100 mmol/L - 26 - 23 -      Capillary Blood Glucose: Lab Results  Component Value Date   GLUCAP 192 (H) 02/20/2014   GLUCAP 208 (H) 12/14/2012   GLUCAP 185 (H) 12/13/2012   GLUCAP 187 (H) 12/13/2012   GLUCAP 205 (H) 07/26/2012     Pulmonary Assessment Scores:     Pulmonary Assessment Scores    Row Name 08/11/17 0847         ADL UCSD   ADL Phase Entry       mMRC Score   mMRC Score 2        Pulmonary Function Assessment:     Pulmonary Function Assessment - 08/05/17 1054      Breath   Bilateral Breath Sounds Clear;Decreased   Shortness of Breath Yes;Limiting activity      Exercise Target Goals: Date: 08/09/17  Exercise Program Goal: Individual exercise prescription set with THRR, safety & activity barriers. Participant demonstrates ability to understand and report RPE using BORG scale, to self-measure pulse accurately, and to acknowledge the importance of the exercise prescription.  Exercise Prescription Goal: Starting with aerobic activity 30 plus minutes a day, 3 days per week for initial exercise prescription. Provide home exercise prescription and guidelines that participant acknowledges understanding prior to discharge.  Activity Barriers & Risk Stratification:     Activity Barriers & Cardiac Risk Stratification - 08/05/17 1040      Activity Barriers & Cardiac Risk Stratification   Activity Barriers Joint Problems;Deconditioning;Shortness of Breath  right knee      6 Minute Walk:     6 Minute Walk    Row Name 08/11/17 0834         6 Minute Walk   Phase Initial     Distance 1500 feet     Walk Time 6 minutes     # of Rest Breaks 0     MPH 2.84     METS 3.14     RPE 11     Perceived Dyspnea  2  Symptoms Yes (comment)     Comments 1/10 knee pain     Resting HR 81 bpm     Resting BP 158/64     Resting Oxygen  Saturation  96 %     Exercise Oxygen Saturation  during 6 min walk 94 %     Max Ex. HR 119 bpm     Max Ex. BP 200/64     2 Minute Post BP 154/60       Interval HR   1 Minute HR 81     2 Minute HR 95     3 Minute HR 112     4 Minute HR 114     5 Minute HR 118     6 Minute HR 119     2 Minute Post HR 106     Interval Heart Rate? Yes       Interval Oxygen   Interval Oxygen? Yes     Baseline Oxygen Saturation % 96 %     1 Minute Oxygen Saturation % 95 %     1 Minute Liters of Oxygen 0 L     2 Minute Oxygen Saturation % 94 %     2 Minute Liters of Oxygen 0 L     3 Minute Oxygen Saturation % 94 %     3 Minute Liters of Oxygen 0 L     4 Minute Oxygen Saturation % 94 %     4 Minute Liters of Oxygen 0 L     5 Minute Oxygen Saturation % 94 %     5 Minute Liters of Oxygen 0 L     6 Minute Oxygen Saturation % 94 %     6 Minute Liters of Oxygen 0 L     2 Minute Post Oxygen Saturation % 95 %     2 Minute Post Liters of Oxygen 0 L        Oxygen Initial Assessment:     Oxygen Initial Assessment - 08/11/17 0847      Initial 6 min Walk   Oxygen Used None     Program Oxygen Prescription   Program Oxygen Prescription None      Oxygen Re-Evaluation:   Oxygen Discharge (Final Oxygen Re-Evaluation):   Initial Exercise Prescription:     Initial Exercise Prescription - 08/11/17 0800      Date of Initial Exercise RX and Referring Provider   Date 08/09/17   Referring Provider Dr. Chase Caller     Bike   Level 0.5   Minutes 17     NuStep   Level 2   Minutes 17   METs 1.5     Track   Laps 15   Minutes 17     Prescription Details   Frequency (times per week) 2   Duration Progress to 45 minutes of aerobic exercise without signs/symptoms of physical distress     Intensity   THRR 40-80% of Max Heartrate 60-119   Ratings of Perceived Exertion 11-13   Perceived Dyspnea 0-4     Progression   Progression Continue progressive overload as per policy without signs/symptoms  or physical distress.     Resistance Training   Training Prescription Yes   Weight Orange bands   Reps 10-15      Perform Capillary Blood Glucose checks as needed.  Exercise Prescription Changes:   Exercise Comments:   Exercise Goals and Review:   Exercise Goals Re-Evaluation :   Discharge Exercise Prescription (Final  Exercise Prescription Changes):   Nutrition:  Target Goals: Understanding of nutrition guidelines, daily intake of sodium 1500mg , cholesterol 200mg , calories 30% from fat and 7% or less from saturated fats, daily to have 5 or more servings of fruits and vegetables.  Biometrics:     Pre Biometrics - 08/05/17 1041      Pre Biometrics   Grip Strength 18 kg       Nutrition Therapy Plan and Nutrition Goals:   Nutrition Discharge: Rate Your Plate Scores:   Nutrition Goals Re-Evaluation:   Nutrition Goals Discharge (Final Nutrition Goals Re-Evaluation):   Psychosocial: Target Goals: Acknowledge presence or absence of significant depression and/or stress, maximize coping skills, provide positive support system. Participant is able to verbalize types and ability to use techniques and skills needed for reducing stress and depression.  Initial Review & Psychosocial Screening:     Initial Psych Review & Screening - 08/05/17 1057      Initial Review   Comments patient is raising her 19 year old grandson as her son and her two grandsons      Quality of Life Scores:   PHQ-9: Recent Review Flowsheet Data    Depression screen [facility] 2/9 08/05/2017 04/26/2017   Decreased Interest 0 0   Down, Depressed, Hopeless 0 0   PHQ - 2 Score 0 0     Interpretation of Total Score  Total Score Depression Severity:  1-4 = Minimal depression, 5-9 = Mild depression, 10-14 = Moderate depression, 15-19 = Moderately severe depression, 20-27 = Severe depression   Psychosocial Evaluation and Intervention:     Psychosocial Evaluation - 08/05/17 1056       Psychosocial Evaluation & Interventions   Interventions Encouraged to exercise with the program and follow exercise prescription   Expected Outcomes patient will remain free from psychosocial barriers to participation in pulmonary rehab   Continue Psychosocial Services  Follow up required by staff      Psychosocial Re-Evaluation:   Psychosocial Discharge (Final Psychosocial Re-Evaluation):   Education: Education Goals: Education classes will be provided on a weekly basis, covering required topics. Participant will state understanding/return demonstration of topics presented.  Learning Barriers/Preferences:     Learning Barriers/Preferences - 08/05/17 1054      Learning Barriers/Preferences   Learning Barriers None   Learning Preferences Verbal Instruction      Education Topics: Risk Factor Reduction:  -Group instruction that is supported by a PowerPoint presentation. Instructor discusses the definition of a risk factor, different risk factors for pulmonary disease, and how the heart and lungs work together.     Nutrition for Pulmonary Patient:  -Group instruction provided by PowerPoint slides, verbal discussion, and written materials to support subject matter. The instructor gives an explanation and review of healthy diet recommendations, which includes a discussion on weight management, recommendations for fruit and vegetable consumption, as well as protein, fluid, caffeine, fiber, sodium, sugar, and alcohol. Tips for eating when patients are short of breath are discussed.   Pursed Lip Breathing:  -Group instruction that is supported by demonstration and informational handouts. Instructor discusses the benefits of pursed lip and diaphragmatic breathing and detailed demonstration on how to preform both.     Oxygen Safety:  -Group instruction provided by PowerPoint, verbal discussion, and written material to support subject matter. There is an overview of "What is Oxygen" and  "Why do we need it".  Instructor also reviews how to create a safe environment for oxygen use, the importance of using oxygen as prescribed,  and the risks of noncompliance. There is a brief discussion on traveling with oxygen and resources the patient may utilize.   Oxygen Equipment:  -Group instruction provided by [facility] Staff utilizing handouts, written materials, and equipment demonstrations.   Signs and Symptoms:  -Group instruction provided by written material and verbal discussion to support subject matter. Warning signs and symptoms of infection, stroke, and heart attack are reviewed and when to call the physician/911 reinforced. Tips for preventing the spread of infection discussed.   Advanced Directives:  -Group instruction provided by verbal instruction and written material to support subject matter. Instructor reviews Advanced Directive laws and proper instruction for filling out document.   Pulmonary Video:  -Group video education that reviews the importance of medication and oxygen compliance, exercise, good nutrition, pulmonary hygiene, and pursed lip and diaphragmatic breathing for the pulmonary patient.   Exercise for the Pulmonary Patient:  -Group instruction that is supported by a PowerPoint presentation. Instructor discusses benefits of exercise, core components of exercise, frequency, duration, and intensity of an exercise routine, importance of utilizing pulse oximetry during exercise, safety while exercising, and options of places to exercise outside of rehab.     Pulmonary Medications:  -Verbally interactive group education provided by instructor with focus on inhaled medications and proper administration.   Anatomy and Physiology of the Respiratory System and Intimacy:  -Group instruction provided by PowerPoint, verbal discussion, and written material to support subject matter. Instructor reviews respiratory cycle and anatomical components of the respiratory  system and their functions. Instructor also reviews differences in obstructive and restrictive respiratory diseases with examples of each. Intimacy, Sex, and Sexuality differences are reviewed with a discussion on how relationships can change when diagnosed with pulmonary disease. Common sexual concerns are reviewed.   MD DAY -A group question and answer session with a medical doctor that allows participants to ask questions that relate to their pulmonary disease state.   OTHER EDUCATION -Group or individual verbal, written, or video instructions that support the educational goals of the pulmonary rehab program.   Knowledge Questionnaire Score:   Core Components/Risk Factors/Patient Goals at Admission:     Personal Goals and Risk Factors at Admission - 08/05/17 1055      Core Components/Risk Factors/Patient Goals on Admission    Weight Management Obesity;Yes   Intervention Weight Management: Develop a combined nutrition and exercise program designed to reach desired caloric intake, while maintaining appropriate intake of nutrient and fiber, sodium and fats, and appropriate energy expenditure required for the weight goal.;Obesity: Provide education and appropriate resources to help participant work on and attain dietary goals.;Weight Management/Obesity: Establish reasonable short term and long term weight goals.;Weight Management: Provide education and appropriate resources to help participant work on and attain dietary goals.   Expected Outcomes Understanding of distribution of calorie intake throughout the day with the consumption of 4-5 meals/snacks;Understanding recommendations for meals to include 15-35% energy as protein, 25-35% energy from fat, 35-60% energy from carbohydrates, less than 200mg  of dietary cholesterol, 20-35 gm of total fiber daily;Weight Loss: Understanding of general recommendations for a balanced deficit meal plan, which promotes 1-2 lb weight loss per week and includes  a negative energy balance of [phone] kcal/d;Short Term: Continue to assess and modify interventions until short term weight is achieved   Improve shortness of breath with ADL's Yes   Intervention Provide education, individualized exercise plan and daily activity instruction to help decrease symptoms of SOB with activities of daily living.   Expected Outcomes Short Term:  Achieves a reduction of symptoms when performing activities of daily living.   Develop more efficient breathing techniques such as purse lipped breathing and diaphragmatic breathing; and practicing self-pacing with activity Yes   Intervention Provide education, demonstration and support about specific breathing techniuqes utilized for more efficient breathing. Include techniques such as pursed lipped breathing, diaphragmatic breathing and self-pacing activity.   Expected Outcomes Short Term: Participant will be able to demonstrate and use breathing techniques as needed throughout daily activities.      Core Components/Risk Factors/Patient Goals Review:    Core Components/Risk Factors/Patient Goals at Discharge (Final Review):    ITP Comments:   Comments:

## 2017-08-16 ENCOUNTER — Encounter (HOSPITAL_COMMUNITY)
Admission: RE | Admit: 2017-08-16 | Discharge: 2017-08-16 | Disposition: A | Discharge status: Home / Self Care | Payer: Medicare Other | Source: Ambulatory Visit | Attending: Internal Medicine | Admitting: Internal Medicine

## 2017-08-16 VITALS — Wt 199.3 lb

## 2017-08-16 DIAGNOSIS — R5381 Other malaise: Secondary | ICD-10-CM | POA: Diagnosis not present

## 2017-08-16 DIAGNOSIS — R0602 Shortness of breath: Secondary | ICD-10-CM | POA: Diagnosis not present

## 2017-08-16 NOTE — Progress Notes (Signed)
Daily Session Note  Patient Details  Name: Alice Cole MRN: [id number] Date of Birth: 07/24/1946 Referring Provider:     Pulmonary Rehab Walk Test from 08/09/2017 in [place]  Referring Provider  Dr. Chase Caller      Encounter Date: 08/16/2017  Check In: Session Check In - 08/16/17 1022      Check-In   Location  [facility]    Staff Present  Su Hilt, MS, ACSM RCEP, Exercise Physiologist;Elohim Brune Ysidro Evert, RN;Joan Leonia Reeves, RN, Luisa Hart, RN, BSN    Supervising physician immediately available to respond to emergencies  [facility] immediately available    Physician(s)  Dr. Broadus John    Medication changes reported      No    Fall or balance concerns reported     No    Tobacco Cessation  No Change    Warm-up and Cool-down  Performed as group-led instruction    Resistance Training Performed  Yes    VAD Patient?  No      Pain Assessment   Currently in Pain?  No/denies    Multiple Pain Sites  No       Capillary Blood Glucose: No results found for this or any previous visit (from the past 24 hour(s)).  Exercise Prescription Changes - 08/16/17 1200      Response to Exercise   Blood Pressure (Admit)  152/62    Blood Pressure (Exercise)  166/62    Blood Pressure (Exit)  118/60    Heart Rate (Admit)  97 bpm    Heart Rate (Exercise)  114 bpm    Heart Rate (Exit)  90 bpm    Oxygen Saturation (Admit)  97 %    Oxygen Saturation (Exercise)  95 %    Oxygen Saturation (Exit)  95 %    Rating of Perceived Exertion (Exercise)  11    Perceived Dyspnea (Exercise)  1    Duration  Progress to 45 minutes of aerobic exercise without signs/symptoms of physical distress    Intensity  Other (comment) 40-80% of HRR   40-80% of HRR     Progression   Progression  Continue to progress workloads to maintain intensity without signs/symptoms of physical distress.      Resistance Training   Training Prescription  Yes    Weight  orange  bands    Reps  10-15    Time  10 Minutes      Bike   Level  0.5    Minutes  17      NuStep   Level  3    Minutes  17    METs  2      Track   Laps  11    Minutes  17       Social History   Tobacco Use  Smoking Status Never Smoker  Smokeless Tobacco Never Used    Goals Met:  Exercise tolerated well No report of cardiac concerns or symptoms Strength training completed today  Goals Unmet:  Not Applicable  Comments: Service time is from 1030 to 1205    Dr. Rush Farmer is Medical Director for Pulmonary Rehab at [facility]

## 2017-08-17 ENCOUNTER — Encounter (HOSPITAL_COMMUNITY): Payer: Self-pay | Admitting: *Deleted

## 2017-08-18 ENCOUNTER — Encounter (HOSPITAL_COMMUNITY)
Admission: RE | Admit: 2017-08-18 | Discharge: 2017-08-18 | Disposition: A | Discharge status: Home / Self Care | Payer: Medicare Other | Source: Ambulatory Visit | Attending: Internal Medicine | Admitting: Internal Medicine

## 2017-08-18 VITALS — Wt 199.5 lb

## 2017-08-18 DIAGNOSIS — R5381 Other malaise: Secondary | ICD-10-CM | POA: Diagnosis not present

## 2017-08-18 DIAGNOSIS — R0602 Shortness of breath: Secondary | ICD-10-CM

## 2017-08-18 NOTE — Progress Notes (Signed)
Daily Session Note  Patient Details  Name: LEYLANIE WOODMANSEE MRN: [id number] Date of Birth: 09/20/1946 Referring Provider:     Pulmonary Rehab Walk Test from 08/09/2017 in [place]  Referring Provider  Dr. Chase Caller      Encounter Date: 08/18/2017  Check In: Session Check In - 08/18/17 1030      Check-In   Location  [facility]    Staff Present  Su Hilt, MS, ACSM RCEP, Exercise Physiologist;Lisa Ysidro Evert, RN;Joan Leonia Reeves, RN, Luisa Hart, RN, BSN    Supervising physician immediately available to respond to emergencies  [facility] immediately available    Physician(s)  Dr. Quincy Simmonds    Medication changes reported      No    Fall or balance concerns reported     No    Tobacco Cessation  No Change    Warm-up and Cool-down  Performed as group-led instruction    Resistance Training Performed  Yes    VAD Patient?  No      Pain Assessment   Currently in Pain?  No/denies    Multiple Pain Sites  No       Capillary Blood Glucose: No results found for this or any previous visit (from the past 24 hour(s)).  Exercise Prescription Changes - 08/18/17 1300      Response to Exercise   Blood Pressure (Admit)  148/56    Blood Pressure (Exercise)  190/66    Blood Pressure (Exit)  170/70    Heart Rate (Admit)  94 bpm    Heart Rate (Exercise)  85 bpm    Heart Rate (Exit)  90 bpm    Oxygen Saturation (Admit)  95 %    Oxygen Saturation (Exercise)  96 %    Oxygen Saturation (Exit)  96 %    Rating of Perceived Exertion (Exercise)  13    Perceived Dyspnea (Exercise)  1    Duration  Progress to 45 minutes of aerobic exercise without signs/symptoms of physical distress    Intensity  Other (comment) 40-80% of HRR   40-80% of HRR     Progression   Progression  Continue to progress workloads to maintain intensity without signs/symptoms of physical distress.      Resistance Training   Training Prescription  Yes    Weight  orange  bands    Reps  10-15    Time  10 Minutes      NuStep   Level  3    Minutes  17    METs  1.9      Track   Laps  11    Minutes  17       Social History   Tobacco Use  Smoking Status Never Smoker  Smokeless Tobacco Never Used    Goals Met:  Exercise tolerated well No report of cardiac concerns or symptoms Strength training completed today  Goals Unmet:  Not Applicable  Comments: Service time is from 10:30a to 12:30p    Dr. Rush Farmer is Medical Director for Pulmonary Rehab at [facility]

## 2017-08-23 ENCOUNTER — Encounter (HOSPITAL_COMMUNITY)
Admission: RE | Admit: 2017-08-23 | Discharge: 2017-08-23 | Disposition: A | Discharge status: Home / Self Care | Payer: Medicare Other | Source: Ambulatory Visit

## 2017-08-23 ENCOUNTER — Encounter: Payer: Self-pay | Admitting: Cardiology

## 2017-08-23 DIAGNOSIS — R0602 Shortness of breath: Secondary | ICD-10-CM

## 2017-08-25 ENCOUNTER — Telehealth: Payer: Self-pay | Admitting: Cardiology

## 2017-08-25 ENCOUNTER — Encounter (HOSPITAL_COMMUNITY)
Admission: RE | Admit: 2017-08-25 | Discharge: 2017-08-25 | Disposition: A | Discharge status: Home / Self Care | Payer: Medicare Other | Source: Ambulatory Visit | Attending: Internal Medicine | Admitting: Internal Medicine

## 2017-08-25 VITALS — Wt 201.3 lb

## 2017-08-25 DIAGNOSIS — R0602 Shortness of breath: Secondary | ICD-10-CM

## 2017-08-25 DIAGNOSIS — R5381 Other malaise: Secondary | ICD-10-CM | POA: Diagnosis not present

## 2017-08-25 NOTE — Telephone Encounter (Signed)
New message   Call from Pulmonary  Rehab  Reporting concerns about blood pressure. Please call  Trish Fountain [phone].    Pt c/o BP issue: STAT if pt c/o blurred vision, one-sided weakness or slurred speech  1. What are your last 5 BP readings? 150/50, 190/66,   2. Are you having any other symptoms (ex. Dizziness, headache, blurred vision, passed out)? NO  3. What is your BP issue? Concerns about BP, thinks medication may need adjustment

## 2017-08-25 NOTE — Telephone Encounter (Signed)
Left message at [phone] and with patient to call back.

## 2017-08-25 NOTE — Progress Notes (Signed)
Daily Session Note  Patient Details  Name: Alice Cole MRN: [id number] Date of Birth: September 25, 1946 Referring Provider:     Pulmonary Rehab Walk Test from 08/09/2017 in [place]  Referring Provider  Dr. Chase Caller      Encounter Date: 08/25/2017  Check In: Session Check In - 08/25/17 1030      Check-In   Location  [facility]    Staff Present  Rosebud Poles, RN, BSN;Molly diVincenzo, MS, ACSM RCEP, Exercise Physiologist;Lisa Ysidro Evert, RN;Daiya Tamer Rollene Rotunda, RN, BSN    Supervising physician immediately available to respond to emergencies  [facility] immediately available    Physician(s)  Dr. Broadus John    Medication changes reported      No    Fall or balance concerns reported     No    Tobacco Cessation  No Change    Warm-up and Cool-down  Performed as group-led instruction    Resistance Training Performed  Yes    VAD Patient?  No      Pain Assessment   Currently in Pain?  No/denies    Multiple Pain Sites  No       Capillary Blood Glucose: No results found for this or any previous visit (from the past 24 hour(s)). POCT Glucose - 08/25/17 1251      POCT Blood Glucose   Pre-Exercise  190 mg/dL    Post-Exercise  113 mg/dL      Exercise Prescription Changes - 08/25/17 1246      Response to Exercise   Blood Pressure (Admit)  150/50    Blood Pressure (Exercise)  190/80    Blood Pressure (Exit)  120/50    Heart Rate (Admit)  87 bpm    Heart Rate (Exercise)  101 bpm    Heart Rate (Exit)  94 bpm    Oxygen Saturation (Admit)  96 %    Oxygen Saturation (Exercise)  96 %    Oxygen Saturation (Exit)  97 %    Rating of Perceived Exertion (Exercise)  11    Perceived Dyspnea (Exercise)  1    Duration  Progress to 45 minutes of aerobic exercise without signs/symptoms of physical distress    Intensity  THRR unchanged      Progression   Progression  Continue to progress workloads to maintain intensity without signs/symptoms of physical  distress.      Resistance Training   Training Prescription  Yes    Weight  orange bands    Reps  10-15    Time  10 Minutes      Bike   Level  0.5    Minutes  17      Track   Laps  14    Minutes  17       Social History   Tobacco Use  Smoking Status Never Smoker  Smokeless Tobacco Never Used    Goals Met:  Exercise tolerated well Queuing for purse lip breathing No report of cardiac concerns or symptoms Strength training completed today  Goals Unmet:  Not Applicable  Comments: Service time is from 1030 to 1225   Dr. Rush Farmer is Medical Director for Pulmonary Rehab at [facility].

## 2017-08-26 NOTE — Telephone Encounter (Signed)
I spoke with pt. She reports BP has been elevated at pulmonary rehab with walking. Improves with rest. She has not checked BP at home in last 2-3 days. She reports when she has checked at home it is sometimes elevated when she is anxious or walking. Improves with rest.  Readings from pulmonary rehab are in chart. Will forward to Dr. Radford Pax for review/recommendations.

## 2017-08-26 NOTE — Progress Notes (Signed)
Pulmonary Individual Treatment Plan  Patient Details  Name: Alice Cole MRN: [id number] Date of Birth: 03/16/1946 Referring Provider:     Pulmonary Rehab Walk Test from 08/09/2017 in [place]  Referring Provider  Dr. Chase Caller      Initial Encounter Date:    Pulmonary Rehab Walk Test from 08/09/2017 in [place]  Date  08/09/17  Referring Provider  Dr. Chase Caller      Visit Diagnosis: Shortness of breath  Patient's Home Medications on Admission:   Current Outpatient Medications:  .  aspirin 325 MG tablet, Take 325 mg by mouth daily., Disp: , Rfl:  .  diltiazem (CARDIZEM CD) 300 MG 24 hr capsule, Take 1 capsule (300 mg total) by mouth daily., Disp: 30 capsule, Rfl: 11 .  hydrochlorothiazide (HYDRODIURIL) 25 MG tablet, Take 25 mg by mouth daily., Disp: , Rfl:  .  Insulin Aspart (NOVOLOG Wilson), Inject 42 Units into the skin 3 (three) times daily. 42 units morning, 42 units lunch, 54 units supper, Disp: , Rfl:  .  metFORMIN (GLUCOPHAGE-XR) 500 MG 24 hr tablet, TAKE 4 TABLETS EVERY DAY WITH EVENING MEAL, Disp: , Rfl: 5 .  NOVOLOG FLEXPEN 100 UNIT/ML FlexPen, , Disp: , Rfl:  .  omeprazole (PRILOSEC) 20 MG capsule, Take 20 mg by mouth daily., Disp: , Rfl:  .  pravastatin (PRAVACHOL) 40 MG tablet, Take 40 mg by mouth daily., Disp: , Rfl: 11 .  ramipril (ALTACE) 10 MG capsule, Take 10 mg by mouth 2 (two) times daily., Disp: , Rfl:  .  tiZANidine (ZANAFLEX) 4 MG tablet, TAKE 1 TABLET BY MOUTH AT BEDTIME AS NEEDED FOR MUSCLE SPASM, Disp: , Rfl: 1 .  traMADol (ULTRAM) 50 MG tablet, TAKE 1 TO 2 TABLETS BY MOUTH TWICE A DAY AS NEEDED FOR SEVERE PAIN, Disp: , Rfl: 1  Past Medical History: Past Medical History:  Diagnosis Date  . Abnormal EKG 07/09/2016  . Abnormal liver function   . Adenomatous colon polyp   . Bigeminy   . Chest pain   . Colonic polyp   . Dermatitis   . Diabetes mellitus   . Diverticulitis    recurrent  .  DVT (deep venous thrombosis) ([facility])   . Esophageal stricture   . GERD (gastroesophageal reflux disease)   . Heart palpitations   . Hemorrhoids   . Hyperlipidemia   . Hypertension   . Obstructive sleep apnea on CPAP    pt does not use  . OSA (obstructive sleep apnea) 07/09/2016   She has not been using her CPAP device because she needs new supplies  . PVC's (premature ventricular contractions)     Tobacco Use: Social History   Tobacco Use  Smoking Status Never Smoker  Smokeless Tobacco Never Used    Labs: Recent Review Flowsheet Data    Labs for ITP Cardiac and Pulmonary Rehab Latest Ref Rng & Units 11/27/2009 12/06/2009 08/25/2010 03/21/2012 12/20/2012   Cholestrol 0 - 200 mg/dL 265 ATP III CLASSIFICATION: <200     mg/dL   Desirable 200-239  mg/dL   Borderline High >=240    mg/dL   High(H) - 194 ATP III CLASSIFICATION: <200     mg/dL   Desirable 200-239  mg/dL   Borderline High >=240    mg/dL   High - -   LDLCALC 0 - 99 mg/dL 181 Total Cholesterol/HDL:CHD Risk Coronary Heart Disease Risk Table Men   Women 1/2 Average Risk   3.4  3.3 Average Risk       5.0   4.4 2 X Average Risk   9.6   7.1 3 X Average Risk  23.4   11.0 Use the calculated Patient Ratio above and the CHD Risk Table to determine the patient's CHD Risk. ATP III CLASSIFICATION (LDL): <100     mg/dL   Optimal 100-129  mg/dL   Near or Above Optimal 130-159  mg/dL   Borderline 160-189  mg/dL   High >190     mg/dL   Very High(H) - 121 Total Cholesterol/HDL:CHD Risk Coronary Heart Disease Risk Table Men   Women 1/2 Average Risk   3.4   3.3 Average Risk       5.0   4.4 2 X Average Risk   9.6   7.1 3 X Average Risk  23.4   11.0 Use the calculated Patient Ratio above and the CHD Risk Table to determine the patient's CHD Risk. ATP III CLASSIFICATION (LDL): <100     mg/dL   Optimal 100-129  mg/dL   Near or Above Optimal 130-159  mg/dL   Borderline 160-189  mg/dL   High >190     mg/dL   Very High(H) - -    HDL >39 mg/dL 35(L) - 38(L) - -   Trlycerides <150 mg/dL 244(H) - 175(H) - -   Hemoglobin A1c 4.6 - 6.5 % - - - - 8.6(H)   TCO2 0 - 100 mmol/L - 26 - 23 -      Capillary Blood Glucose: Lab Results  Component Value Date   GLUCAP 192 (H) 02/20/2014   GLUCAP 208 (H) 12/14/2012   GLUCAP 185 (H) 12/13/2012   GLUCAP 187 (H) 12/13/2012   GLUCAP 205 (H) 07/26/2012   POCT Glucose    Row Name 08/16/17 1214 08/18/17 1333 08/25/17 1251         POCT Blood Glucose   Pre-Exercise  220 mg/dL  243 mg/dL  190 mg/dL     Post-Exercise  166 mg/dL  147 mg/dL  113 mg/dL        Pulmonary Assessment Scores: Pulmonary Assessment Scores    Row Name 08/11/17 0847 08/17/17 0918       ADL UCSD   ADL Phase  Entry  Entry    SOB Score total  -  43      CAT Score   CAT Score  -  17 Entry      mMRC Score   mMRC Score  2  -       Pulmonary Function Assessment: Pulmonary Function Assessment - 08/05/17 1054      Breath   Bilateral Breath Sounds  Clear;Decreased    Shortness of Breath  Yes;Limiting activity       Exercise Target Goals:    Exercise Program Goal: Individual exercise prescription set with THRR, safety & activity barriers. Participant demonstrates ability to understand and report RPE using BORG scale, to self-measure pulse accurately, and to acknowledge the importance of the exercise prescription.  Exercise Prescription Goal: Starting with aerobic activity 30 plus minutes a day, 3 days per week for initial exercise prescription. Provide home exercise prescription and guidelines that participant acknowledges understanding prior to discharge.  Activity Barriers & Risk Stratification: Activity Barriers & Cardiac Risk Stratification - 08/05/17 1040      Activity Barriers & Cardiac Risk Stratification   Activity Barriers  Joint Problems;Deconditioning;Shortness of Breath right knee       6 Minute Walk: 6 Minute Walk  Row Name 08/11/17 0834         6 Minute Walk    Phase  Initial     Distance  1500 feet     Walk Time  6 minutes     # of Rest Breaks  0     MPH  2.84     METS  3.14     RPE  11     Perceived Dyspnea   2     Symptoms  Yes (comment)     Comments  1/10 knee pain     Resting HR  81 bpm     Resting BP  158/64     Resting Oxygen Saturation   96 %     Exercise Oxygen Saturation  during 6 min walk  94 %     Max Ex. HR  119 bpm     Max Ex. BP  200/64     2 Minute Post BP  154/60       Interval HR   1 Minute HR  81     2 Minute HR  95     3 Minute HR  112     4 Minute HR  114     5 Minute HR  118     6 Minute HR  119     2 Minute Post HR  106     Interval Heart Rate?  Yes       Interval Oxygen   Interval Oxygen?  Yes     Baseline Oxygen Saturation %  96 %     1 Minute Oxygen Saturation %  95 %     1 Minute Liters of Oxygen  0 L     2 Minute Oxygen Saturation %  94 %     2 Minute Liters of Oxygen  0 L     3 Minute Oxygen Saturation %  94 %     3 Minute Liters of Oxygen  0 L     4 Minute Oxygen Saturation %  94 %     4 Minute Liters of Oxygen  0 L     5 Minute Oxygen Saturation %  94 %     5 Minute Liters of Oxygen  0 L     6 Minute Oxygen Saturation %  94 %     6 Minute Liters of Oxygen  0 L     2 Minute Post Oxygen Saturation %  95 %     2 Minute Post Liters of Oxygen  0 L        Oxygen Initial Assessment: Oxygen Initial Assessment - 08/11/17 0847      Initial 6 min Walk   Oxygen Used  None      Program Oxygen Prescription   Program Oxygen Prescription  None       Oxygen Re-Evaluation: Oxygen Re-Evaluation    Row Name 08/22/17 1348             Program Oxygen Prescription   Program Oxygen Prescription  None         Home Oxygen   Home Oxygen Device  None       Sleep Oxygen Prescription  CPAP       Liters per minute  0       Home Exercise Oxygen Prescription  None       Home at Rest Exercise Oxygen Prescription  None       Compliance with Home  Oxygen Use  No          Oxygen Discharge (Final Oxygen  Re-Evaluation): Oxygen Re-Evaluation - 08/22/17 1348      Program Oxygen Prescription   Program Oxygen Prescription  None      Home Oxygen   Home Oxygen Device  None    Sleep Oxygen Prescription  CPAP    Liters per minute  0    Home Exercise Oxygen Prescription  None    Home at Rest Exercise Oxygen Prescription  None    Compliance with Home Oxygen Use  No       Initial Exercise Prescription: Initial Exercise Prescription - 08/11/17 0800      Date of Initial Exercise RX and Referring Provider   Date  08/09/17    Referring Provider  Dr. Chase Caller      Bike   Level  0.5    Minutes  17      NuStep   Level  2    Minutes  17    METs  1.5      Track   Laps  15    Minutes  17      Prescription Details   Frequency (times per week)  2    Duration  Progress to 45 minutes of aerobic exercise without signs/symptoms of physical distress      Intensity   THRR 40-80% of Max Heartrate  60-119    Ratings of Perceived Exertion  11-13    Perceived Dyspnea  0-4      Progression   Progression  Continue progressive overload as per policy without signs/symptoms or physical distress.      Resistance Training   Training Prescription  Yes    Weight  Orange bands    Reps  10-15       Perform Capillary Blood Glucose checks as needed.  Exercise Prescription Changes: Exercise Prescription Changes    Row Name 08/16/17 1200 08/18/17 1300 08/25/17 1246         Response to Exercise   Blood Pressure (Admit)  152/62  148/56  150/50     Blood Pressure (Exercise)  166/62  190/66  190/80     Blood Pressure (Exit)  118/60  170/70  120/50     Heart Rate (Admit)  97 bpm  94 bpm  87 bpm     Heart Rate (Exercise)  114 bpm  85 bpm  101 bpm     Heart Rate (Exit)  90 bpm  90 bpm  94 bpm     Oxygen Saturation (Admit)  97 %  95 %  96 %     Oxygen Saturation (Exercise)  95 %  96 %  96 %     Oxygen Saturation (Exit)  95 %  96 %  97 %     Rating of Perceived Exertion (Exercise)  _0 Perceived Dyspnea (Exercise)  _1 Duration  Progress to 45 minutes of aerobic exercise without signs/symptoms of physical distress  Progress to 45 minutes of aerobic exercise without signs/symptoms of physical distress  Progress to 45 minutes of aerobic exercise without signs/symptoms of physical distress     Intensity  Other (comment) 40-80% of HRR  Other (comment) 40-80% of HRR  THRR unchanged       Progression   Progression  Continue to progress workloads to maintain intensity without signs/symptoms of physical distress.  Continue  to progress workloads to maintain intensity without signs/symptoms of physical distress.  Continue to progress workloads to maintain intensity without signs/symptoms of physical distress.       Resistance Training   Training Prescription  Yes  Yes  Yes     Weight  orange bands  orange bands  orange bands     Reps  10-15  10-15  10-15     Time  10 Minutes  10 Minutes  10 Minutes       Bike   Level  0.5  -  0.5     Minutes  17  -  17       NuStep   Level  3  3  -     Minutes  17  17  -     METs  2  1.9  -       Track   Laps  _0 Minutes  _1 Exercise Comments:   Exercise Goals and Review:   Exercise Goals Re-Evaluation : Exercise Goals Re-Evaluation    Row Name 08/25/17 1658 08/26/17 0709           Exercise Goal Re-Evaluation   Exercise Goals Review  Increase Physical Activity;Increase Strength and Stamina;Able to understand and use rate of perceived exertion (RPE) scale;Able to understand and use Dyspnea scale;Knowledge and understanding of Target Heart Rate Range (THRR);Understanding of Exercise Prescription  Increase Strength and Stamina;Increase Physical Activity;Able to understand and use Dyspnea scale;Able to understand and use rate of perceived exertion (RPE) scale;Knowledge and understanding of Target Heart Rate Range (THRR);Understanding of Exercise Prescription      Comments  -  Patient has only attended  three exercise sessions. Will cont. to monitor and progress as able.       Expected Outcomes  -  Through exercise at rehab and at home, patient will increase strength and stamina and be able to perform ADL's at home.          Discharge Exercise Prescription (Final Exercise Prescription Changes): Exercise Prescription Changes - 08/25/17 1246      Response to Exercise   Blood Pressure (Admit)  150/50    Blood Pressure (Exercise)  190/80    Blood Pressure (Exit)  120/50    Heart Rate (Admit)  87 bpm    Heart Rate (Exercise)  101 bpm    Heart Rate (Exit)  94 bpm    Oxygen Saturation (Admit)  96 %    Oxygen Saturation (Exercise)  96 %    Oxygen Saturation (Exit)  97 %    Rating of Perceived Exertion (Exercise)  11    Perceived Dyspnea (Exercise)  1    Duration  Progress to 45 minutes of aerobic exercise without signs/symptoms of physical distress    Intensity  THRR unchanged      Progression   Progression  Continue to progress workloads to maintain intensity without signs/symptoms of physical distress.      Resistance Training   Training Prescription  Yes    Weight  orange bands    Reps  10-15    Time  10 Minutes      Bike   Level  0.5    Minutes  17      Track   Laps  14    Minutes  17       Nutrition:  Target Goals: Understanding of nutrition  guidelines, daily intake of sodium '1500mg'$ , cholesterol '200mg'$ , calories 30% from fat and 7% or less from saturated fats, daily to have 5 or more servings of fruits and vegetables.  Biometrics: Pre Biometrics - 08/05/17 1041      Pre Biometrics   Grip Strength  18 kg        Nutrition Therapy Plan and Nutrition Goals:   Nutrition Discharge: Rate Your Plate Scores:   Nutrition Goals Re-Evaluation:   Nutrition Goals Discharge (Final Nutrition Goals Re-Evaluation):   Psychosocial: Target Goals: Acknowledge presence or absence of significant depression and/or stress, maximize coping skills, provide positive support  system. Participant is able to verbalize types and ability to use techniques and skills needed for reducing stress and depression.  Initial Review & Psychosocial Screening: Initial Psych Review & Screening - 08/05/17 1057      Initial Review   Comments  patient is raising her 34 year old grandson as her son and her two grandsons       Quality of Life Scores:   PHQ-9: Recent Review Flowsheet Data    Depression screen [facility] 2/9 08/05/2017 04/26/2017   Decreased Interest 0 0   Down, Depressed, Hopeless 0 0   PHQ - 2 Score 0 0     Interpretation of Total Score  Total Score Depression Severity:  1-4 = Minimal depression, 5-9 = Mild depression, 10-14 = Moderate depression, 15-19 = Moderately severe depression, 20-27 = Severe depression   Psychosocial Evaluation and Intervention: Psychosocial Evaluation - 08/05/17 1056      Psychosocial Evaluation & Interventions   Interventions  Encouraged to exercise with the program and follow exercise prescription    Expected Outcomes  patient will remain free from psychosocial barriers to participation in pulmonary rehab    Continue Psychosocial Services   Follow up required by staff       Psychosocial Re-Evaluation: Psychosocial Re-Evaluation    [place] Name 08/22/17 1350             Psychosocial Re-Evaluation   Current issues with  Current Stress Concerns       Comments  patient is raising her 3 grandchildren, 1 as her own. she worries that her health will not allow her to see them marry.       Expected Outcomes  patient will remain free from psychosocial barriers to participation in pulmonary rehab       Interventions  Encouraged to attend Pulmonary Rehabilitation for the exercise       Continue Psychosocial Services   Follow up required by staff          Psychosocial Discharge (Final Psychosocial Re-Evaluation): Psychosocial Re-Evaluation - 08/22/17 1350      Psychosocial Re-Evaluation   Current issues with  Current Stress Concerns     Comments  patient is raising her 3 grandchildren, 1 as her own. she worries that her health will not allow her to see them marry.    Expected Outcomes  patient will remain free from psychosocial barriers to participation in pulmonary rehab    Interventions  Encouraged to attend Pulmonary Rehabilitation for the exercise    Continue Psychosocial Services   Follow up required by staff       Education: Education Goals: Education classes will be provided on a weekly basis, covering required topics. Participant will state understanding/return demonstration of topics presented.  Learning Barriers/Preferences: Learning Barriers/Preferences - 08/05/17 1054      Learning Barriers/Preferences   Learning Barriers  None    Learning  Preferences  Verbal Instruction       Education Topics: Risk Factor Reduction:  -Group instruction that is supported by a PowerPoint presentation. Instructor discusses the definition of a risk factor, different risk factors for pulmonary disease, and how the heart and lungs work together.     Nutrition for Pulmonary Patient:  -Group instruction provided by PowerPoint slides, verbal discussion, and written materials to support subject matter. The instructor gives an explanation and review of healthy diet recommendations, which includes a discussion on weight management, recommendations for fruit and vegetable consumption, as well as protein, fluid, caffeine, fiber, sodium, sugar, and alcohol. Tips for eating when patients are short of breath are discussed.   Pursed Lip Breathing:  -Group instruction that is supported by demonstration and informational handouts. Instructor discusses the benefits of pursed lip and diaphragmatic breathing and detailed demonstration on how to preform both.     Oxygen Safety:  -Group instruction provided by PowerPoint, verbal discussion, and written material to support subject matter. There is an overview of "What is Oxygen" and "Why do  we need it".  Instructor also reviews how to create a safe environment for oxygen use, the importance of using oxygen as prescribed, and the risks of noncompliance. There is a brief discussion on traveling with oxygen and resources the patient may utilize.   Oxygen Equipment:  -Group instruction provided by [facility] Staff utilizing handouts, written materials, and equipment demonstrations.   Signs and Symptoms:  -Group instruction provided by written material and verbal discussion to support subject matter. Warning signs and symptoms of infection, stroke, and heart attack are reviewed and when to call the physician/911 reinforced. Tips for preventing the spread of infection discussed.   Advanced Directives:  -Group instruction provided by verbal instruction and written material to support subject matter. Instructor reviews Advanced Directive laws and proper instruction for filling out document.   Pulmonary Video:  -Group video education that reviews the importance of medication and oxygen compliance, exercise, good nutrition, pulmonary hygiene, and pursed lip and diaphragmatic breathing for the pulmonary patient.   Exercise for the Pulmonary Patient:  -Group instruction that is supported by a PowerPoint presentation. Instructor discusses benefits of exercise, core components of exercise, frequency, duration, and intensity of an exercise routine, importance of utilizing pulse oximetry during exercise, safety while exercising, and options of places to exercise outside of rehab.     PULMONARY REHAB OTHER RESPIRATORY from 08/25/2017 in [place]  Date  08/25/17  Educator  Cloyde Reams  Instruction Review Code  2- meets goals/outcomes      Pulmonary Medications:  -Verbally interactive group education provided by instructor with focus on inhaled medications and proper administration.   Anatomy and Physiology of the Respiratory System and Intimacy:  -Group  instruction provided by PowerPoint, verbal discussion, and written material to support subject matter. Instructor reviews respiratory cycle and anatomical components of the respiratory system and their functions. Instructor also reviews differences in obstructive and restrictive respiratory diseases with examples of each. Intimacy, Sex, and Sexuality differences are reviewed with a discussion on how relationships can change when diagnosed with pulmonary disease. Common sexual concerns are reviewed.   MD DAY -A group question and answer session with a medical doctor that allows participants to ask questions that relate to their pulmonary disease state.   OTHER EDUCATION -Group or individual verbal, written, or video instructions that support the educational goals of the pulmonary rehab program.   PULMONARY REHAB OTHER RESPIRATORY from 08/25/2017  in [place]  Date  08/18/17 [[facility]]  Educator  RD  Instruction Review Code  1- Verbalizes Understanding      Knowledge Questionnaire Score: Knowledge Questionnaire Score - 08/17/17 0918      Knowledge Questionnaire Score   Pre Score  8/13       Core Components/Risk Factors/Patient Goals at Admission: Personal Goals and Risk Factors at Admission - 08/05/17 1055      Core Components/Risk Factors/Patient Goals on Admission    Weight Management  Obesity;Yes    Intervention  Weight Management: Develop a combined nutrition and exercise program designed to reach desired caloric intake, while maintaining appropriate intake of nutrient and fiber, sodium and fats, and appropriate energy expenditure required for the weight goal.;Obesity: Provide education and appropriate resources to help participant work on and attain dietary goals.;Weight Management/Obesity: Establish reasonable short term and long term weight goals.;Weight Management: Provide education and appropriate resources to help participant work on and  attain dietary goals.    Expected Outcomes  Understanding of distribution of calorie intake throughout the day with the consumption of 4-5 meals/snacks;Understanding recommendations for meals to include 15-35% energy as protein, 25-35% energy from fat, 35-60% energy from carbohydrates, less than 240m of dietary cholesterol, 20-35 gm of total fiber daily;Weight Loss: Understanding of general recommendations for a balanced deficit meal plan, which promotes 1-2 lb weight loss per week and includes a negative energy balance of [phone] kcal/d;Short Term: Continue to assess and modify interventions until short term weight is achieved    Improve shortness of breath with ADL's  Yes    Intervention  Provide education, individualized exercise plan and daily activity instruction to help decrease symptoms of SOB with activities of daily living.    Expected Outcomes  Short Term: Achieves a reduction of symptoms when performing activities of daily living.    Develop more efficient breathing techniques such as purse lipped breathing and diaphragmatic breathing; and practicing self-pacing with activity  Yes    Intervention  Provide education, demonstration and support about specific breathing techniuqes utilized for more efficient breathing. Include techniques such as pursed lipped breathing, diaphragmatic breathing and self-pacing activity.    Expected Outcomes  Short Term: Participant will be able to demonstrate and use breathing techniques as needed throughout daily activities.       Core Components/Risk Factors/Patient Goals Review:  Goals and Risk Factor Review    Row Name 08/22/17 1349             Core Components/Risk Factors/Patient Goals Review   Personal Goals Review  Weight Management/Obesity;Improve shortness of breath with ADL's;Develop more efficient breathing techniques such as purse lipped breathing and diaphragmatic breathing and practicing self-pacing with activity.       Review  patient has  only attended a few sessions since admission and it is too early to evaluate progress towards pulmonary rehab goals       Expected Outcomes  see admission expected outcomes          Core Components/Risk Factors/Patient Goals at Discharge (Final Review):  Goals and Risk Factor Review - 08/22/17 1349      Core Components/Risk Factors/Patient Goals Review   Personal Goals Review  Weight Management/Obesity;Improve shortness of breath with ADL's;Develop more efficient breathing techniques such as purse lipped breathing and diaphragmatic breathing and practicing self-pacing with activity.    Review  patient has only attended a few sessions since admission and it is too early to evaluate progress towards pulmonary  rehab goals    Expected Outcomes  see admission expected outcomes       ITP Comments:   Comments: ITP REVIEW Pt is making slight progress toward pulmonary rehab goals after completing 4 sessions. Recommend continued exercise, life style modification, education, and utilization of breathing techniques to increase stamina and strength and decrease shortness of breath with exertion.

## 2017-08-26 NOTE — Telephone Encounter (Signed)
Alice Cole is returning a call. Thanks

## 2017-08-27 NOTE — Telephone Encounter (Signed)
She needs to come in for OV with extender

## 2017-08-29 NOTE — Telephone Encounter (Signed)
I spoke with pt and scheduled her to see Ermalinda Barrios, PA on 09/05/17 at 8:00

## 2017-08-30 ENCOUNTER — Encounter (HOSPITAL_COMMUNITY)
Admission: RE | Admit: 2017-08-30 | Discharge: 2017-08-30 | Disposition: A | Discharge status: Home / Self Care | Payer: Medicare Other | Source: Ambulatory Visit

## 2017-08-31 DIAGNOSIS — K589 Irritable bowel syndrome without diarrhea: Secondary | ICD-10-CM | POA: Diagnosis not present

## 2017-09-05 ENCOUNTER — Encounter: Payer: Self-pay | Admitting: Physician Assistant

## 2017-09-05 ENCOUNTER — Encounter (INDEPENDENT_AMBULATORY_CARE_PROVIDER_SITE_OTHER): Payer: Self-pay

## 2017-09-05 ENCOUNTER — Ambulatory Visit (INDEPENDENT_AMBULATORY_CARE_PROVIDER_SITE_OTHER): Payer: Medicare Other | Admitting: Physician Assistant

## 2017-09-05 VITALS — BP 170/66 | HR 95 | Resp 16 | Ht 67.0 in | Wt 198.4 lb

## 2017-09-05 DIAGNOSIS — I10 Essential (primary) hypertension: Secondary | ICD-10-CM

## 2017-09-05 DIAGNOSIS — I493 Ventricular premature depolarization: Secondary | ICD-10-CM | POA: Diagnosis not present

## 2017-09-05 DIAGNOSIS — G4733 Obstructive sleep apnea (adult) (pediatric): Secondary | ICD-10-CM

## 2017-09-05 MED ORDER — DILTIAZEM HCL ER COATED BEADS 360 MG PO CP24: 360.0000 mg | ORAL_CAPSULE | Freq: Every day | ORAL | 3 refills | Status: DC

## 2017-09-05 MED ORDER — ASPIRIN EC 81 MG PO TBEC: 81.0000 mg | DELAYED_RELEASE_TABLET | Freq: Every day | ORAL | Status: DC

## 2017-09-05 NOTE — Addendum Note (Signed)
Addended by: Michae Kava on: 09/05/2017 08:30 AM   Modules accepted: Orders

## 2017-09-05 NOTE — Patient Instructions (Addendum)
Medication Instructions:  1. STOP ASPIRIN 325 MG   2. START ASPIRIN 81 MG DAILY, MAKE SURE TO GET THE COATED ASPIRIN TO HELP PROTECT YOUR STOMACH  3. INCREASE CARDIZEM TO 360 MG DAILY; NEW RX HAS BEEN SENT IN    Labwork: NONE ORDERED TODAY  Testing/Procedures: NONE ORDERED TODAY  Follow-Up: 1. YOU WILL NEED A BLOOD PRESSURE FOLLOW UP IN 1 WEEK WITH MEGAN SUPPLE, PHARM-D   2. DR. Radford Pax 11/09/17 @ 8:40   Any Other Special Instructions Will Be Listed Below (If Applicable).  Low-Sodium Eating Plan Sodium, which is an element that makes up salt, helps you maintain a healthy balance of fluids in your body. Too much sodium can increase your blood pressure and cause fluid and waste to be held in your body. Your health care provider or dietitian may recommend following this plan if you have high blood pressure (hypertension), kidney disease, liver disease, or heart failure. Eating less sodium can help lower your blood pressure, reduce swelling, and protect your heart, liver, and kidneys. What are tips for following this plan? General guidelines  Most people on this plan should limit their sodium intake to 1,500-2,000 mg (milligrams) of sodium each day. Reading food labels  The Nutrition Facts label lists the amount of sodium in one serving of the food. If you eat more than one serving, you must multiply the listed amount of sodium by the number of servings.  Choose foods with less than 140 mg of sodium per serving.  Avoid foods with 300 mg of sodium or more per serving. Shopping  Look for lower-sodium products, often labeled as "low-sodium" or "no salt added."  Always check the sodium content even if foods are labeled as "unsalted" or "no salt added".  Buy fresh foods. ? Avoid canned foods and premade or frozen meals. ? Avoid canned, cured, or processed meats  Buy breads that have less than 80 mg of sodium per slice. Cooking  Eat more home-cooked food and less restaurant,  buffet, and fast food.  Avoid adding salt when cooking. Use salt-free seasonings or herbs instead of table salt or sea salt. Check with your health care provider or pharmacist before using salt substitutes.  Cook with plant-based oils, such as canola, sunflower, or olive oil. Meal planning  When eating at a restaurant, ask that your food be prepared with less salt or no salt, if possible.  Avoid foods that contain MSG (monosodium glutamate). MSG is sometimes added to [place] food, bouillon, and some canned foods. What foods are recommended? The items listed may not be a complete list. Talk with your dietitian about what dietary choices are best for you. Grains Low-sodium cereals, including oats, puffed wheat and rice, and shredded wheat. Low-sodium crackers. Unsalted rice. Unsalted pasta. Low-sodium bread. Whole-grain breads and whole-grain pasta. Vegetables Fresh or frozen vegetables. "No salt added" canned vegetables. "No salt added" tomato sauce and paste. Low-sodium or reduced-sodium tomato and vegetable juice. Fruits Fresh, frozen, or canned fruit. Fruit juice. Meats and other protein foods Fresh or frozen (no salt added) meat, poultry, seafood, and fish. Low-sodium canned tuna and salmon. Unsalted nuts. Dried peas, beans, and lentils without added salt. Unsalted canned beans. Eggs. Unsalted nut butters. Dairy Milk. Soy milk. Cheese that is naturally low in sodium, such as ricotta cheese, fresh mozzarella, or Swiss cheese Low-sodium or reduced-sodium cheese. Cream cheese. Yogurt. Fats and oils Unsalted butter. Unsalted margarine with no trans fat. Vegetable oils such as canola or olive oils. Seasonings and other  foods Fresh and dried herbs and spices. Salt-free seasonings. Low-sodium mustard and ketchup. Sodium-free salad dressing. Sodium-free light mayonnaise. Fresh or refrigerated horseradish. Lemon juice. Vinegar. Homemade, reduced-sodium, or low-sodium soups. Unsalted popcorn and  pretzels. Low-salt or salt-free chips. What foods are not recommended? The items listed may not be a complete list. Talk with your dietitian about what dietary choices are best for you. Grains Instant hot cereals. Bread stuffing, pancake, and biscuit mixes. Croutons. Seasoned rice or pasta mixes. Noodle soup cups. Boxed or frozen macaroni and cheese. Regular salted crackers. Self-rising flour. Vegetables Sauerkraut, pickled vegetables, and relishes. Olives. [place] fries. Onion rings. Regular canned vegetables (not low-sodium or reduced-sodium). Regular canned tomato sauce and paste (not low-sodium or reduced-sodium). Regular tomato and vegetable juice (not low-sodium or reduced-sodium). Frozen vegetables in sauces. Meats and other protein foods Meat or fish that is salted, canned, smoked, spiced, or pickled. Bacon, ham, sausage, hotdogs, corned beef, chipped beef, packaged lunch meats, salt pork, jerky, pickled herring, anchovies, regular canned tuna, sardines, salted nuts. Dairy Processed cheese and cheese spreads. Cheese curds. Blue cheese. Feta cheese. String cheese. Regular cottage cheese. Buttermilk. Canned milk. Fats and oils Salted butter. Regular margarine. Ghee. Bacon fat. Seasonings and other foods Onion salt, garlic salt, seasoned salt, table salt, and sea salt. Canned and packaged gravies. Worcestershire sauce. Tartar sauce. Barbecue sauce. Teriyaki sauce. Soy sauce, including reduced-sodium. Steak sauce. Fish sauce. Oyster sauce. Cocktail sauce. Horseradish that you find on the shelf. Regular ketchup and mustard. Meat flavorings and tenderizers. Bouillon cubes. Hot sauce and Tabasco sauce. Premade or packaged marinades. Premade or packaged taco seasonings. Relishes. Regular salad dressings. Salsa. Potato and tortilla chips. Corn chips and puffs. Salted popcorn and pretzels. Canned or dried soups. Pizza. Frozen entrees and pot pies. Summary  Eating less sodium can help lower your blood  pressure, reduce swelling, and protect your heart, liver, and kidneys.  Most people on this plan should limit their sodium intake to 1,500-2,000 mg (milligrams) of sodium each day.  Canned, boxed, and frozen foods are high in sodium. Restaurant foods, fast foods, and pizza are also very high in sodium. You also get sodium by adding salt to food.  Try to cook at home, eat more fresh fruits and vegetables, and eat less fast food, canned, processed, or prepared foods. This information is not intended to replace advice given to you by your health care provider. Make sure you discuss any questions you have with your health care provider. Document Released: 03/19/2002 Document Revised: 09/20/2016 Document Reviewed: 09/20/2016 Elsevier Interactive Patient Education  2017 [employer].   If you need a refill on your cardiac medications before your next appointment, please call your pharmacy.

## 2017-09-05 NOTE — Progress Notes (Signed)
Cardiology Office Note    Date:  09/05/2017   ID:  Alice Cole, Shrieves 03/15/1946, MRN [id number]  PCP:  Maurice Small, MD  Cardiologist: Dr. Fransico Him  Chief Complaint  Patient presents with  . Hypertension    History of Present Illness:  Alice Cole is a 71 y.o. female with history of obstructive sleep apnea not compliant with C Pap, PVCs and hypertension. History normal coronary arteries on cath in 2003, no ischemia on nuclear stress test 2017 and 2-D echo with normal LV function with diastolic dysfunction. I saw Dr. Radford Pax 12/2016. She was having dyspnea on exertion felt secondary to obesity, poorly controlled hypertension, deconditioning and DD. Cardizem was increased to 360 mg daily but her insurance won't pay for. She was seen in the hypertension clinic the following week blood pressure was much better controlled. PFTs were abnormal with mild to moderate restriction in she was referred to pulmonary. High-resolution CT 02/2017 showed no evidence of interstitial lung disease and tiny 2-3 mm pulmonary nodules on the right. She was referred to pulmonary rehabilitation.  Patient's blood pressure has been elevated at pulmonary rehabilitation when walking and improves with rest. Patient says her legs have been swelling some 2. She tries to follow low sodium diet but knows she gets extra salt at times. She's also gained some weight. She's tried Weight Watchers in the past but didn't like it. Denies chest pain or palpitations.    Past Medical History:  Diagnosis Date  . Abnormal EKG 07/09/2016  . Abnormal liver function   . Adenomatous colon polyp   . Bigeminy   . Chest pain   . Colonic polyp   . Dermatitis   . Diabetes mellitus   . Diverticulitis    recurrent  . DVT (deep venous thrombosis) ([place])   . Esophageal stricture   . GERD (gastroesophageal reflux disease)   . Heart palpitations   . Hemorrhoids   . Hyperlipidemia   . Hypertension   . Obstructive sleep apnea on CPAP    pt  does not use  . OSA (obstructive sleep apnea) 07/09/2016   She has not been using her CPAP device because she needs new supplies  . PVC's (premature ventricular contractions)     Past Surgical History:  Procedure Laterality Date  . ABDOMINAL HYSTERECTOMY    . CHOLECYSTECTOMY     aprox 10 years ago  . EYE SURGERY     right and left eye  . KNEE SURGERY     Right  . KNEE SURGERY     Left    Current Medications: Current Meds  Medication Sig  . hydrochlorothiazide (HYDRODIURIL) 25 MG tablet Take 25 mg by mouth daily.  . Insulin Aspart (NOVOLOG [place]) Inject 42 Units into the skin 3 (three) times daily. 42 units morning, 42 units lunch, 54 units supper  . metFORMIN (GLUCOPHAGE-XR) 500 MG 24 hr tablet TAKE 4 TABLETS EVERY DAY WITH EVENING MEAL  . NOVOLOG FLEXPEN 100 UNIT/ML FlexPen   . omeprazole (PRILOSEC) 20 MG capsule Take 20 mg by mouth daily.  . pravastatin (PRAVACHOL) 40 MG tablet Take 40 mg by mouth daily.  . ramipril (ALTACE) 10 MG capsule Take 10 mg by mouth 2 (two) times daily.  Marland Kitchen tiZANidine (ZANAFLEX) 4 MG tablet TAKE 1 TABLET BY MOUTH AT BEDTIME AS NEEDED FOR MUSCLE SPASM  . traMADol (ULTRAM) 50 MG tablet TAKE 1 TO 2 TABLETS BY MOUTH TWICE A DAY AS NEEDED FOR SEVERE PAIN  . [DISCONTINUED]  aspirin 325 MG tablet Take 325 mg by mouth daily.  . [DISCONTINUED] diltiazem (CARDIZEM CD) 300 MG 24 hr capsule Take 1 capsule (300 mg total) by mouth daily.     Allergies:   Metoprolol; Nortriptyline; Olmesartan; Tradjenta [linagliptin]; Alatrofloxacin; Benicar [olmesartan medoxomil]; Beta adrenergic blockers; Cartia xt [diltiazem]; Cephalexin; Codeine; Colesevelam; Crestor [rosuvastatin calcium]; Flagyl [metronidazole]; Fluvastatin sodium; Levemir [insulin detemir]; Lisinopril; Metoprolol succinate; Nortriptyline hcl; Pamelor [nortriptyline hcl]; Rosuvastatin; Sulfamethoxazole-trimethoprim; Verapamil; Welchol [colesevelam hcl]; Adhesive [tape]; Sitagliptin; and Trovan [trovafloxacin]    Social History   Socioeconomic History  . Marital status: Married    Spouse name: None  . Number of children: 2  . Years of education: 30  . Highest education level: None  Social Needs  . Financial resource strain: None  . Food insecurity - worry: None  . Food insecurity - inability: None  . Transportation needs - medical: None  . Transportation needs - non-medical: None  Occupational History  . Occupation: Retired  Tobacco Use  . Smoking status: Never Smoker  . Smokeless tobacco: Never Used  Substance and Sexual Activity  . Alcohol use: No    Comment: used to drink 30 yrs ago  . Drug use: No  . Sexual activity: None  Other Topics Concern  . None  Social History Narrative   Regular exercise-yes   Caffeine use-yes   Lives with Husband and 3 graqdnsons and one is adopted   Husband drinks a lot-her husband drinks and smokes a lot   Used to be be married and is currently divorced   Daughters are on drugs     Family History:  The patient's family history includes Cancer in her daughter; Diabetes in her other; Heart attack (age of onset: 16) in her mother; Heart attack (age of onset: 37) in her maternal grandfather; Heart attack (age of onset: 71) in her maternal grandmother; Heart attack (age of onset: 37) in her brother; Heart disease in her other.   ROS:   Please see the history of present illness.    Review of Systems  Constitution: Negative.  HENT: Negative.   Eyes: Negative.   Cardiovascular: Positive for dyspnea on exertion and palpitations.  Respiratory: Negative.   Hematologic/Lymphatic: Negative.   Musculoskeletal: Negative.  Negative for joint pain.  Gastrointestinal: Negative.   Genitourinary: Negative.   Neurological: Positive for loss of balance.   All other systems reviewed and are negative.   PHYSICAL EXAM:   VS:  BP (!) 170/66   Pulse 95   Resp 16   Ht 5\' 7"  (1.702 m)   Wt 198 lb 6.4 oz (90 kg)   SpO2 96%   BMI 31.07 kg/m   Physical Exam   GEN: Obese, in no acute distress  Neck: no JVD, carotid bruits, or masses Cardiac:RRR; no murmurs, rubs, or gallops  Respiratory:  clear to auscultation bilaterally, normal work of breathing GI: soft, nontender, nondistended, + BS Ext: without cyanosis, clubbing, or edema, Good distal pulses bilaterally Neuro:  Alert and Oriented x 3 Psych: euthymic mood, full affect  Wt Readings from Last 3 Encounters:  09/05/17 198 lb 6.4 oz (90 kg)  08/25/17 201 lb 4.5 oz (91.3 kg)  08/18/17 199 lb 8.3 oz (90.5 kg)      Studies/Labs Reviewed:   EKG:  EKG is  ordered today.  The ekg ordered today demonstrates normal sinus rhythm at 84 bpm poor R wave progression anteriorly, no acute change  Recent Labs: 12/21/2016: BUN 19; Creatinine, Ser 0.95; NT-Pro BNP  104; Potassium 4.1; Sodium 138; TSH 2.070   Lipid Panel    Component Value Date/Time   CHOL  08/25/2010 0404    194        ATP III CLASSIFICATION:  <200     mg/dL   Desirable  200-239  mg/dL   Borderline High  >=240    mg/dL   High          TRIG 175 (H) 08/25/2010 0404   HDL 38 (L) 08/25/2010 0404   CHOLHDL 5.1 08/25/2010 0404   VLDL 35 08/25/2010 0404   LDLCALC (H) 08/25/2010 0404    121        Total Cholesterol/HDL:CHD Risk Coronary Heart Disease Risk Table                     Men   Women  1/2 Average Risk   3.4   3.3  Average Risk       5.0   4.4  2 X Average Risk   9.6   7.1  3 X Average Risk  23.4   11.0        Use the calculated Patient Ratio above and the CHD Risk Table to determine the patient's CHD Risk.        ATP III CLASSIFICATION (LDL):  <100     mg/dL   Optimal  100-129  mg/dL   Near or Above                    Optimal  130-159  mg/dL   Borderline  160-189  mg/dL   High  >190     mg/dL   Very High    Additional studies/ records that were reviewed today include:    IMPRESSION: HRCT 02/2017 1. No evidence of interstitial lung disease. 2. No acute findings are noted in the thorax. 3. Moderate air  trapping indicative of small airways disease. 4. Two tiny 2-3 mm pulmonary nodules in the apex of the right upper lobe, nonspecific, but statistically likely benign. No follow-up needed if patient is low-risk (and has no known or suspected primary neoplasm). Non-contrast chest CT can be considered in 12 months if patient is high-risk. This recommendation follows the consensus statement: Guidelines for Management of Incidental Pulmonary Nodules Detected on CT Images: From the Fleischner Society 2017; Radiology 2017; 284:228-243. 5. Aortic atherosclerosis, in addition to left main and 3 vessel coronary artery disease. Please note that although the presence of coronary artery calcium documents the presence of coronary artery disease, the severity of this disease and any potential stenosis cannot be assessed on this non-gated CT examination. Assessment for potential risk factor modification, dietary therapy or pharmacologic therapy may be warranted, if clinically indicated. 6. Hepatic steatosis. 7. There are mild calcifications of the aortic valve. Echocardiographic correlation for evaluation of potential valvular dysfunction may be warranted if clinically indicated.     Electronically Signed   By: Vinnie Langton M.D.   On: 02/17/2017 08:56   2-D echo 01/2016 Study Conclusions   - Left ventricle: The cavity size was normal. There was mild   concentric hypertrophy. Systolic function was vigorous. The   estimated ejection fraction was in the range of 65% to 70%. Wall   motion was normal; there were no regional wall motion   abnormalities. Doppler parameters are consistent with abnormal   left ventricular relaxation (grade 1 diastolic dysfunction). - Aortic valve: Transvalvular velocity was within the normal range.   There was  no stenosis. There was no regurgitation. - Mitral valve: Calcified annulus. Transvalvular velocity was   within the normal range. There was no evidence for  stenosis.   There was no regurgitation. - Right ventricle: The cavity size was normal. Wall thickness was   normal. Systolic function was normal. - Tricuspid valve: There was no regurgitation. - Inferior vena cava: The vessel was normal in size. The   respirophasic diameter changes were in the normal range (= 50%),   consistent with normal central venous pressure. - Normal global longitudinal strain. -21.9%.   Nuclear stress test 4/5/17Study Highlights    Nuclear stress EF: 74%.  Blood pressure demonstrated a hypertensive response to exercise.  There was no ST segment deviation noted during stress.  The study is normal.  This is a low risk study.  The left ventricular ejection fraction is hyperdynamic (>65%).       ASSESSMENT:    1. Essential hypertension, benign   2. Premature ventricular contraction   3. OSA (obstructive sleep apnea)      PLAN:  In order of problems listed above:  Essential hypertension blood pressures been elevated during pulmonary rehabilitation.she never increased her diltiazem to 360 mg because of insurance reasons. She has since changed pharmacies and thinks it will be covered. Will increase diltiazem to 360 mg daily. Follow-up with hypertension clinic next week. 2 g sodium reiterated. Weight loss program recommended.  PVCs controlled with Diltiazem  Obstructive sleep apnea not compliant with CPap. I have asked her to use her CPap.   Medication Adjustments/Labs and Tests Ordered: Current medicines are reviewed at length with the patient today.  Concerns regarding medicines are outlined above.  Medication changes, Labs and Tests ordered today are listed in the Patient Instructions below. Patient Instructions  Medication Instructions:  1. STOP ASPIRIN 325 MG   2. START ASPIRIN 81 MG DAILY, MAKE SURE TO GET THE COATED ASPIRIN TO HELP PROTECT YOUR STOMACH  3. INCREASE CARDIZEM TO 360 MG DAILY; NEW RX HAS BEEN SENT IN    Labwork: NONE  ORDERED TODAY  Testing/Procedures: NONE ORDERED TODAY  Follow-Up: 1. YOU WILL NEED A BLOOD PRESSURE FOLLOW UP IN 1 WEEK WITH MEGAN SUPPLE, PHARM-D   2. DR. Radford Pax 12/2017; WE WILL SEND OUT A REMINDER LETTER A FEW MONTHS EARLIER FOR YOU TO CALL AND MAKE AN APPT.  Any Other Special Instructions Will Be Listed Below (If Applicable).  Low-Sodium Eating Plan Sodium, which is an element that makes up salt, helps you maintain a healthy balance of fluids in your body. Too much sodium can increase your blood pressure and cause fluid and waste to be held in your body. Your health care provider or dietitian may recommend following this plan if you have high blood pressure (hypertension), kidney disease, liver disease, or heart failure. Eating less sodium can help lower your blood pressure, reduce swelling, and protect your heart, liver, and kidneys. What are tips for following this plan? General guidelines  Most people on this plan should limit their sodium intake to 1,500-2,000 mg (milligrams) of sodium each day. Reading food labels  The Nutrition Facts label lists the amount of sodium in one serving of the food. If you eat more than one serving, you must multiply the listed amount of sodium by the number of servings.  Choose foods with less than 140 mg of sodium per serving.  Avoid foods with 300 mg of sodium or more per serving. Shopping  Look for lower-sodium products, often labeled as "  low-sodium" or "no salt added."  Always check the sodium content even if foods are labeled as "unsalted" or "no salt added".  Buy fresh foods. ? Avoid canned foods and premade or frozen meals. ? Avoid canned, cured, or processed meats  Buy breads that have less than 80 mg of sodium per slice. Cooking  Eat more home-cooked food and less restaurant, buffet, and fast food.  Avoid adding salt when cooking. Use salt-free seasonings or herbs instead of table salt or sea salt. Check with your health care  provider or pharmacist before using salt substitutes.  Cook with plant-based oils, such as canola, sunflower, or olive oil. Meal planning  When eating at a restaurant, ask that your food be prepared with less salt or no salt, if possible.  Avoid foods that contain MSG (monosodium glutamate). MSG is sometimes added to [place] food, bouillon, and some canned foods. What foods are recommended? The items listed may not be a complete list. Talk with your dietitian about what dietary choices are best for you. Grains Low-sodium cereals, including oats, puffed wheat and rice, and shredded wheat. Low-sodium crackers. Unsalted rice. Unsalted pasta. Low-sodium bread. Whole-grain breads and whole-grain pasta. Vegetables Fresh or frozen vegetables. "No salt added" canned vegetables. "No salt added" tomato sauce and paste. Low-sodium or reduced-sodium tomato and vegetable juice. Fruits Fresh, frozen, or canned fruit. Fruit juice. Meats and other protein foods Fresh or frozen (no salt added) meat, poultry, seafood, and fish. Low-sodium canned tuna and salmon. Unsalted nuts. Dried peas, beans, and lentils without added salt. Unsalted canned beans. Eggs. Unsalted nut butters. Dairy Milk. Soy milk. Cheese that is naturally low in sodium, such as ricotta cheese, fresh mozzarella, or Swiss cheese Low-sodium or reduced-sodium cheese. Cream cheese. Yogurt. Fats and oils Unsalted butter. Unsalted margarine with no trans fat. Vegetable oils such as canola or olive oils. Seasonings and other foods Fresh and dried herbs and spices. Salt-free seasonings. Low-sodium mustard and ketchup. Sodium-free salad dressing. Sodium-free light mayonnaise. Fresh or refrigerated horseradish. Lemon juice. Vinegar. Homemade, reduced-sodium, or low-sodium soups. Unsalted popcorn and pretzels. Low-salt or salt-free chips. What foods are not recommended? The items listed may not be a complete list. Talk with your dietitian about what  dietary choices are best for you. Grains Instant hot cereals. Bread stuffing, pancake, and biscuit mixes. Croutons. Seasoned rice or pasta mixes. Noodle soup cups. Boxed or frozen macaroni and cheese. Regular salted crackers. Self-rising flour. Vegetables Sauerkraut, pickled vegetables, and relishes. Olives. [place] fries. Onion rings. Regular canned vegetables (not low-sodium or reduced-sodium). Regular canned tomato sauce and paste (not low-sodium or reduced-sodium). Regular tomato and vegetable juice (not low-sodium or reduced-sodium). Frozen vegetables in sauces. Meats and other protein foods Meat or fish that is salted, canned, smoked, spiced, or pickled. Bacon, ham, sausage, hotdogs, corned beef, chipped beef, packaged lunch meats, salt pork, jerky, pickled herring, anchovies, regular canned tuna, sardines, salted nuts. Dairy Processed cheese and cheese spreads. Cheese curds. Blue cheese. Feta cheese. String cheese. Regular cottage cheese. Buttermilk. Canned milk. Fats and oils Salted butter. Regular margarine. Ghee. Bacon fat. Seasonings and other foods Onion salt, garlic salt, seasoned salt, table salt, and sea salt. Canned and packaged gravies. Worcestershire sauce. Tartar sauce. Barbecue sauce. Teriyaki sauce. Soy sauce, including reduced-sodium. Steak sauce. Fish sauce. Oyster sauce. Cocktail sauce. Horseradish that you find on the shelf. Regular ketchup and mustard. Meat flavorings and tenderizers. Bouillon cubes. Hot sauce and Tabasco sauce. Premade or packaged marinades. Premade or packaged taco seasonings. Relishes. Regular  salad dressings. Salsa. Potato and tortilla chips. Corn chips and puffs. Salted popcorn and pretzels. Canned or dried soups. Pizza. Frozen entrees and pot pies. Summary  Eating less sodium can help lower your blood pressure, reduce swelling, and protect your heart, liver, and kidneys.  Most people on this plan should limit their sodium intake to 1,500-2,000 mg  (milligrams) of sodium each day.  Canned, boxed, and frozen foods are high in sodium. Restaurant foods, fast foods, and pizza are also very high in sodium. You also get sodium by adding salt to food.  Try to cook at home, eat more fresh fruits and vegetables, and eat less fast food, canned, processed, or prepared foods. This information is not intended to replace advice given to you by your health care provider. Make sure you discuss any questions you have with your health care provider. Document Released: 03/19/2002 Document Revised: 09/20/2016 Document Reviewed: 09/20/2016 Elsevier Interactive Patient Education  2017 [employer].   If you need a refill on your cardiac medications before your next appointment, please call your pharmacy.      Signed, Ermalinda Barrios, PA-C  09/05/2017 8:21 AM    [facility] [address], [place], [place]  [address] Phone: [phone]; Fax: [phone]

## 2017-09-06 ENCOUNTER — Encounter (HOSPITAL_COMMUNITY)
Admission: RE | Admit: 2017-09-06 | Discharge: 2017-09-06 | Disposition: A | Discharge status: Home / Self Care | Payer: Medicare Other | Source: Ambulatory Visit | Attending: Internal Medicine | Admitting: Internal Medicine

## 2017-09-06 VITALS — Wt 196.7 lb

## 2017-09-06 DIAGNOSIS — R5381 Other malaise: Secondary | ICD-10-CM | POA: Diagnosis not present

## 2017-09-06 DIAGNOSIS — R0602 Shortness of breath: Secondary | ICD-10-CM | POA: Diagnosis not present

## 2017-09-06 NOTE — Progress Notes (Signed)
Daily Session Note  Patient Details  Name: Alice Cole MRN: [id number] Date of Birth: 01-31-1946 Referring Provider:     Pulmonary Rehab Walk Test from 08/09/2017 in [facility]  Referring Provider  Dr. Chase Caller      Encounter Date: 09/06/2017  Check In: Session Check In - 09/06/17 1030      Check-In   Location  [facility]    Staff Present  Rosebud Poles, RN, BSN;Molly diVincenzo, MS, ACSM RCEP, Exercise Physiologist;Lisa Ysidro Evert, RN;Shey Yott Rollene Rotunda, RN, BSN    Supervising physician immediately available to respond to emergencies  [facility] immediately available    Physician(s)  Dr. Maylene Roes    Medication changes reported      No    Fall or balance concerns reported     No    Tobacco Cessation  No Change    Warm-up and Cool-down  Performed as group-led instruction    Resistance Training Performed  Yes    VAD Patient?  No      Pain Assessment   Currently in Pain?  No/denies    Multiple Pain Sites  No       Capillary Blood Glucose: No results found for this or any previous visit (from the past 24 hour(s)). POCT Glucose - 09/06/17 1248      POCT Blood Glucose   Pre-Exercise  180 mg/dL    Post-Exercise  140 mg/dL      Exercise Prescription Changes - 09/06/17 1243      Response to Exercise   Blood Pressure (Admit)  166/80    Blood Pressure (Exercise)  160/60    Blood Pressure (Exit)  112/60    Heart Rate (Admit)  94 bpm    Heart Rate (Exercise)  109 bpm    Heart Rate (Exit)  89 bpm    Oxygen Saturation (Admit)  97 %    Oxygen Saturation (Exercise)  94 %    Oxygen Saturation (Exit)  97 %    Rating of Perceived Exertion (Exercise)  11    Perceived Dyspnea (Exercise)  1    Duration  Progress to 45 minutes of aerobic exercise without signs/symptoms of physical distress    Intensity  THRR unchanged      Progression   Progression  Continue to progress workloads to maintain intensity without signs/symptoms of physical  distress.      Resistance Training   Training Prescription  Yes    Weight  orange bands    Reps  10-15    Time  10 Minutes      Bike   Level  0.5    Minutes  17      NuStep   Level  3    Minutes  17    METs  1.8      Track   Laps  13    Minutes  17       Social History   Tobacco Use  Smoking Status Never Smoker  Smokeless Tobacco Never Used    Goals Met:  Exercise tolerated well No report of cardiac concerns or symptoms Strength training completed today  Goals Unmet:  Not Applicable  Comments: Service time is from 1030 to 1215   Dr. Rush Farmer is Medical Director for Pulmonary Rehab at [facility].

## 2017-09-08 ENCOUNTER — Encounter (HOSPITAL_COMMUNITY)
Admission: RE | Admit: 2017-09-08 | Discharge: 2017-09-08 | Disposition: A | Discharge status: Home / Self Care | Payer: Medicare Other | Source: Ambulatory Visit | Attending: Internal Medicine | Admitting: Internal Medicine

## 2017-09-08 ENCOUNTER — Encounter (HOSPITAL_COMMUNITY): Payer: Medicare Other

## 2017-09-08 VITALS — Wt 201.1 lb

## 2017-09-08 DIAGNOSIS — R0602 Shortness of breath: Secondary | ICD-10-CM

## 2017-09-08 DIAGNOSIS — R5381 Other malaise: Secondary | ICD-10-CM | POA: Diagnosis not present

## 2017-09-08 NOTE — Progress Notes (Signed)
Alice Cole 71 y.o. female   DOB: 12-12-1945 MRN: [id number]          Nutrition 1. Shortness of breath    Past Medical History:  Diagnosis Date  . Abnormal EKG 07/09/2016  . Abnormal liver function   . Adenomatous colon polyp   . Bigeminy   . Chest pain   . Colonic polyp   . Dermatitis   . Diabetes mellitus   . Diverticulitis    recurrent  . DVT (deep venous thrombosis) ([place])   . Esophageal stricture   . GERD (gastroesophageal reflux disease)   . Heart palpitations   . Hemorrhoids   . Hyperlipidemia   . Hypertension   . Obstructive sleep apnea on CPAP    pt does not use  . OSA (obstructive sleep apnea) 07/09/2016   She has not been using her CPAP device because she needs new supplies  . PVC's (premature ventricular contractions)    Meds reviewed. Metformin Novolog noted  Ht: Ht Readings from Last 1 Encounters:  09/05/17 5\' 7"  (1.702 m)     Wt:  Wt Readings from Last 3 Encounters:  09/06/17 196 lb 10.4 oz (89.2 kg)  09/05/17 198 lb 6.4 oz (90 kg)  08/25/17 201 lb 4.5 oz (91.3 kg)     BMI: 30.8    Current tobacco use? No  Labs:  Lipid Panel  Lab Results  Component Value Date   HGBA1C 8.6 (H) 12/20/2012    Nutrition Diagnosis ? Food-and nutrition-related knowledge deficit related to lack of exposure to information as related to diagnosis of pulmonary disease ? Obesity related to excessive energy intake as evidenced by a BMI of 30.8  Goal(s) 1. Identify food quantities necessary to achieve wt loss of  -2# per week to a goal wt loss of 6-24 lb at graduation from pulmonary rehab.  Plan:  Pt to attend Pulmonary Nutrition class Will provide client-centered nutrition education as part of interdisciplinary care.   Monitor and evaluate progress toward nutrition goal with team.  Monitor and Evaluate progress toward nutrition goal with team.   Derek Mound, M.Ed, RD, LDN, CDE 09/08/2017 1:32 PM

## 2017-09-08 NOTE — Progress Notes (Signed)
Daily Session Note  Patient Details  Name: Alice Cole MRN: [id number] Date of Birth: Oct 11, 1946 Referring Provider:     Pulmonary Rehab Walk Test from 08/09/2017 in [place]  Referring Provider  Dr. Chase Caller      Encounter Date: 09/08/2017  Check In: Session Check In - 09/08/17 1322      Check-In   Location  [facility]    Staff Present  Su Hilt, MS, ACSM RCEP, Exercise Physiologist;Lisa Ysidro Evert, RN;Kemo [place], RN, [place] [employer], MS, ACSM RCEP, Exercise Physiologist;Joan Leonia Reeves, RN, BSN    Supervising physician immediately available to respond to emergencies  [facility] immediately available    Physician(s)  Dr. [place] Crane    Medication changes reported      No    Fall or balance concerns reported     No    Tobacco Cessation  No Change    Warm-up and Cool-down  Performed as group-led instruction    Resistance Training Performed  Yes    VAD Patient?  No      Pain Assessment   Currently in Pain?  No/denies    Multiple Pain Sites  No       Capillary Blood Glucose: No results found for this or any previous visit (from the past 24 hour(s)). POCT Glucose - 09/08/17 1352      POCT Blood Glucose   Pre-Exercise  258 mg/dL    Post-Exercise  149 mg/dL      Exercise Prescription Changes - 09/08/17 1300      Response to Exercise   Blood Pressure (Admit)  166/50    Blood Pressure (Exercise)  142/60    Blood Pressure (Exit)  140/60    Heart Rate (Admit)  90 bpm    Heart Rate (Exercise)  101 bpm    Heart Rate (Exit)  81 bpm    Oxygen Saturation (Admit)  96 %    Oxygen Saturation (Exercise)  96 %    Oxygen Saturation (Exit)  96 %    Rating of Perceived Exertion (Exercise)  13    Perceived Dyspnea (Exercise)  1    Duration  Progress to 45 minutes of aerobic exercise without signs/symptoms of physical distress    Intensity  THRR unchanged      Progression   Progression  Continue to progress workloads to  maintain intensity without signs/symptoms of physical distress.      Resistance Training   Training Prescription  Yes    Weight  orange bands    Reps  10-15    Time  10 Minutes      Bike   Level  0.5    Minutes  17      NuStep   Level  2    Minutes  17    METs  2       Social History   Tobacco Use  Smoking Status Never Smoker  Smokeless Tobacco Never Used    Goals Met:  Using PLB without cueing & demonstrates good technique Exercise tolerated well No report of cardiac concerns or symptoms Strength training completed today  Goals Unmet:  Not Applicable  Comments: Service time is from 1030 to 1235   Dr. Rush Farmer is Medical Director for Pulmonary Rehab at [facility].

## 2017-09-13 ENCOUNTER — Encounter (HOSPITAL_COMMUNITY)
Admission: RE | Admit: 2017-09-13 | Discharge: 2017-09-13 | Disposition: A | Discharge status: Home / Self Care | Payer: Medicare Other | Source: Ambulatory Visit | Attending: Internal Medicine | Admitting: Internal Medicine

## 2017-09-13 VITALS — Wt 196.7 lb

## 2017-09-13 DIAGNOSIS — R5381 Other malaise: Secondary | ICD-10-CM | POA: Insufficient documentation

## 2017-09-13 DIAGNOSIS — R0602 Shortness of breath: Secondary | ICD-10-CM | POA: Insufficient documentation

## 2017-09-13 NOTE — Progress Notes (Signed)
Daily Session Note  Patient Details  Name: Alice Cole MRN: [id number] Date of Birth: 1946-10-08 Referring Provider:     Pulmonary Rehab Walk Test from 08/09/2017 in [place]  Referring Provider  Dr. Chase Caller      Encounter Date: 09/13/2017  Check In: Session Check In - 09/13/17 1030      Check-In   Location  [facility]    Staff Present  Rosebud Poles, RN, BSN;Bevelyn Arriola, MS, ACSM RCEP, Exercise Physiologist;Lisa Ysidro Evert, RN;Portia Rollene Rotunda, RN, BSN    Supervising physician immediately available to respond to emergencies  [facility] immediately available    Physician(s)  Dr. Nevada Crane    Medication changes reported      No    Fall or balance concerns reported     No    Tobacco Cessation  No Change    Warm-up and Cool-down  Performed as group-led instruction    Resistance Training Performed  Yes    VAD Patient?  No      Pain Assessment   Currently in Pain?  No/denies    Multiple Pain Sites  No       Capillary Blood Glucose: No results found for this or any previous visit (from the past 24 hour(s)). POCT Glucose - 09/13/17 1235      POCT Blood Glucose   Pre-Exercise  238 mg/dL    Post-Exercise  248 mg/dL      Exercise Prescription Changes - 09/13/17 1200      Response to Exercise   Blood Pressure (Admit)  170/60    Blood Pressure (Exercise)  160/60    Blood Pressure (Exit)  124/60    Heart Rate (Admit)  98 bpm    Heart Rate (Exercise)  108 bpm    Heart Rate (Exit)  87 bpm    Oxygen Saturation (Admit)  96 %    Oxygen Saturation (Exercise)  97 %    Oxygen Saturation (Exit)  96 %    Rating of Perceived Exertion (Exercise)  13    Perceived Dyspnea (Exercise)  3    Duration  Progress to 45 minutes of aerobic exercise without signs/symptoms of physical distress    Intensity  THRR unchanged      Progression   Progression  Continue to progress workloads to maintain intensity without signs/symptoms of physical  distress.      Resistance Training   Training Prescription  Yes    Weight  orange bands    Reps  10-15    Time  10 Minutes      Bike   Level  0.8    Minutes  17      NuStep   Level  4    Minutes  17    METs  2.1      Track   Laps  14    Minutes  17       Social History   Tobacco Use  Smoking Status Never Smoker  Smokeless Tobacco Never Used    Goals Met:  Exercise tolerated well No report of cardiac concerns or symptoms Strength training completed today  Goals Unmet:  Not Applicable  Comments: Service time is from 10:30a to 12:05p    Dr. Rush Farmer is Medical Director for Pulmonary Rehab at [facility].

## 2017-09-15 ENCOUNTER — Encounter (HOSPITAL_COMMUNITY)
Admission: RE | Admit: 2017-09-15 | Discharge: 2017-09-15 | Disposition: A | Discharge status: Home / Self Care | Payer: Medicare Other | Source: Ambulatory Visit

## 2017-09-15 DIAGNOSIS — R0602 Shortness of breath: Secondary | ICD-10-CM

## 2017-09-15 NOTE — Progress Notes (Signed)
Pulmonary Individual Treatment Plan  Patient Details  Name: Alice Cole MRN: [id number] Date of Birth: [birth year] Referring Provider:     Pulmonary Rehab Walk Test from 08/09/2017 in [place]  Referring Provider  Dr. Chase Caller      Initial Encounter Date:    Pulmonary Rehab Walk Test from 08/09/2017 in [place]  Date  08/09/17  Referring Provider  Dr. Chase Caller      Visit Diagnosis: Shortness of breath  Patient's Home Medications on Admission:   Current Outpatient Medications:  .  aspirin EC 81 MG tablet, Take 1 tablet (81 mg total) by mouth daily., Disp: , Rfl:  .  diltiazem (CARDIZEM CD) 360 MG 24 hr capsule, Take 1 capsule (360 mg total) by mouth daily., Disp: 90 capsule, Rfl: 3 .  hydrochlorothiazide (HYDRODIURIL) 25 MG tablet, Take 25 mg by mouth daily., Disp: , Rfl:  .  Insulin Aspart (NOVOLOG [place]), Inject 42 Units into the skin 3 (three) times daily. 42 units morning, 42 units lunch, 54 units supper, Disp: , Rfl:  .  metFORMIN (GLUCOPHAGE-XR) 500 MG 24 hr tablet, TAKE 4 TABLETS EVERY DAY WITH EVENING MEAL, Disp: , Rfl: 5 .  NOVOLOG FLEXPEN 100 UNIT/ML FlexPen, , Disp: , Rfl:  .  omeprazole (PRILOSEC) 20 MG capsule, Take 20 mg by mouth daily., Disp: , Rfl:  .  pravastatin (PRAVACHOL) 40 MG tablet, Take 40 mg by mouth daily., Disp: , Rfl: 11 .  ramipril (ALTACE) 10 MG capsule, Take 10 mg by mouth 2 (two) times daily., Disp: , Rfl:  .  tiZANidine (ZANAFLEX) 4 MG tablet, TAKE 1 TABLET BY MOUTH AT BEDTIME AS NEEDED FOR MUSCLE SPASM, Disp: , Rfl: 1 .  traMADol (ULTRAM) 50 MG tablet, TAKE 1 TO 2 TABLETS BY MOUTH TWICE A DAY AS NEEDED FOR SEVERE PAIN, Disp: , Rfl: 1  Past Medical History: Past Medical History:  Diagnosis Date  . Abnormal EKG 07/09/2016  . Abnormal liver function   . Adenomatous colon polyp   . Bigeminy   . Chest pain   . Colonic polyp   . Dermatitis   . Diabetes mellitus   . Diverticulitis     recurrent  . DVT (deep venous thrombosis) ([place])   . Esophageal stricture   . GERD (gastroesophageal reflux disease)   . Heart palpitations   . Hemorrhoids   . Hyperlipidemia   . Hypertension   . Obstructive sleep apnea on CPAP    pt does not use  . OSA (obstructive sleep apnea) 07/09/2016   She has not been using her CPAP device because she needs new supplies  . PVC's (premature ventricular contractions)     Tobacco Use: Social History   Tobacco Use  Smoking Status Never Smoker  Smokeless Tobacco Never Used    Labs: Recent Review Flowsheet Data    Labs for ITP Cardiac and Pulmonary Rehab Latest Ref Rng & Units 11/27/2009 12/06/2009 08/25/2010 03/21/2012 12/20/2012   Cholestrol 0 - 200 mg/dL 265 ATP III CLASSIFICATION: <200     mg/dL   Desirable 200-239  mg/dL   Borderline High >=240    mg/dL   High(H) - 194 ATP III CLASSIFICATION: <200     mg/dL   Desirable 200-239  mg/dL   Borderline High >=240    mg/dL   High - -   LDLCALC 0 - 99 mg/dL 181 Total Cholesterol/HDL:CHD Risk Coronary Heart Disease Risk Table Men   Women 1/2 Average  Risk   3.4   3.3 Average Risk       5.0   4.4 2 X Average Risk   9.6   7.1 3 X Average Risk  23.4   11.0 Use the calculated Patient Ratio above and the CHD Risk Table to determine the patient's CHD Risk. ATP III CLASSIFICATION (LDL): <100     mg/dL   Optimal 100-129  mg/dL   Near or Above Optimal 130-159  mg/dL   Borderline 160-189  mg/dL   High >190     mg/dL   Very High(H) - 121 Total Cholesterol/HDL:CHD Risk Coronary Heart Disease Risk Table Men   Women 1/2 Average Risk   3.4   3.3 Average Risk       5.0   4.4 2 X Average Risk   9.6   7.1 3 X Average Risk  23.4   11.0 Use the calculated Patient Ratio above and the CHD Risk Table to determine the patient's CHD Risk. ATP III CLASSIFICATION (LDL): <100     mg/dL   Optimal 100-129  mg/dL   Near or Above Optimal 130-159  mg/dL   Borderline 160-189  mg/dL   High >190     mg/dL    Very High(H) - -   HDL >39 mg/dL 35(L) - 38(L) - -   Trlycerides <150 mg/dL 244(H) - 175(H) - -   Hemoglobin A1c 4.6 - 6.5 % - - - - 8.6(H)   TCO2 0 - 100 mmol/L - 26 - 23 -      Capillary Blood Glucose: Lab Results  Component Value Date   GLUCAP 192 (H) 02/20/2014   GLUCAP 208 (H) 12/14/2012   GLUCAP 185 (H) 12/13/2012   GLUCAP 187 (H) 12/13/2012   GLUCAP 205 (H) 07/26/2012   POCT Glucose    Row Name 08/16/17 1214 08/18/17 1333 08/25/17 1251 09/06/17 1248 09/08/17 1352     POCT Blood Glucose   Pre-Exercise  220 mg/dL  243 mg/dL  190 mg/dL  180 mg/dL  258 mg/dL   Post-Exercise  166 mg/dL  147 mg/dL  113 mg/dL  140 mg/dL  149 mg/dL   Row Name 09/13/17 1235             POCT Blood Glucose   Pre-Exercise  238 mg/dL       Post-Exercise  248 mg/dL          Pulmonary Assessment Scores: Pulmonary Assessment Scores    Row Name 08/11/17 0847 08/17/17 0918       ADL UCSD   ADL Phase  Entry  Entry    SOB Score total  -  43      CAT Score   CAT Score  -  17 Entry      mMRC Score   mMRC Score  2  -       Pulmonary Function Assessment: Pulmonary Function Assessment - 08/05/17 1054      Breath   Bilateral Breath Sounds  Clear;Decreased    Shortness of Breath  Yes;Limiting activity       Exercise Target Goals:    Exercise Program Goal: Individual exercise prescription set with THRR, safety & activity barriers. Participant demonstrates ability to understand and report RPE using BORG scale, to self-measure pulse accurately, and to acknowledge the importance of the exercise prescription.  Exercise Prescription Goal: Starting with aerobic activity 30 plus minutes a day, 3 days per week for initial exercise prescription. Provide home exercise prescription and guidelines that  participant acknowledges understanding prior to discharge.  Activity Barriers & Risk Stratification: Activity Barriers & Cardiac Risk Stratification - 08/05/17 1040      Activity Barriers &  Cardiac Risk Stratification   Activity Barriers  Joint Problems;Deconditioning;Shortness of Breath right knee       6 Minute Walk: 6 Minute Walk    Row Name 08/11/17 0834         6 Minute Walk   Phase  Initial     Distance  1500 feet     Walk Time  6 minutes     # of Rest Breaks  0     MPH  2.84     METS  3.14     RPE  11     Perceived Dyspnea   2     Symptoms  Yes (comment)     Comments  1/10 knee pain     Resting HR  81 bpm     Resting BP  158/64     Resting Oxygen Saturation   96 %     Exercise Oxygen Saturation  during 6 min walk  94 %     Max Ex. HR  119 bpm     Max Ex. BP  200/64     2 Minute Post BP  154/60       Interval HR   1 Minute HR  81     2 Minute HR  95     3 Minute HR  112     4 Minute HR  114     5 Minute HR  118     6 Minute HR  119     2 Minute Post HR  106     Interval Heart Rate?  Yes       Interval Oxygen   Interval Oxygen?  Yes     Baseline Oxygen Saturation %  96 %     1 Minute Oxygen Saturation %  95 %     1 Minute Liters of Oxygen  0 L     2 Minute Oxygen Saturation %  94 %     2 Minute Liters of Oxygen  0 L     3 Minute Oxygen Saturation %  94 %     3 Minute Liters of Oxygen  0 L     4 Minute Oxygen Saturation %  94 %     4 Minute Liters of Oxygen  0 L     5 Minute Oxygen Saturation %  94 %     5 Minute Liters of Oxygen  0 L     6 Minute Oxygen Saturation %  94 %     6 Minute Liters of Oxygen  0 L     2 Minute Post Oxygen Saturation %  95 %     2 Minute Post Liters of Oxygen  0 L        Oxygen Initial Assessment: Oxygen Initial Assessment - 08/11/17 0847      Initial 6 min Walk   Oxygen Used  None      Program Oxygen Prescription   Program Oxygen Prescription  None       Oxygen Re-Evaluation: Oxygen Re-Evaluation    Row Name 08/22/17 1348 09/13/17 1248           Program Oxygen Prescription   Program Oxygen Prescription  None  None        Home Oxygen   Home Oxygen Device  None  None      Sleep Oxygen  Prescription  CPAP  CPAP      Liters per minute  0  0      Home Exercise Oxygen Prescription  None  None      Home at Rest Exercise Oxygen Prescription  None  None      Compliance with Home Oxygen Use  No  No does not tolerate CPAP at HS         Oxygen Discharge (Final Oxygen Re-Evaluation): Oxygen Re-Evaluation - 09/13/17 1248      Program Oxygen Prescription   Program Oxygen Prescription  None      Home Oxygen   Home Oxygen Device  None    Sleep Oxygen Prescription  CPAP    Liters per minute  0    Home Exercise Oxygen Prescription  None    Home at Rest Exercise Oxygen Prescription  None    Compliance with Home Oxygen Use  No does not tolerate CPAP at HS       Initial Exercise Prescription: Initial Exercise Prescription - 08/11/17 0800      Date of Initial Exercise RX and Referring Provider   Date  08/09/17    Referring Provider  Dr. Chase Caller      Bike   Level  0.5    Minutes  17      NuStep   Level  2    Minutes  17    METs  1.5      Track   Laps  15    Minutes  17      Prescription Details   Frequency (times per week)  2    Duration  Progress to 45 minutes of aerobic exercise without signs/symptoms of physical distress      Intensity   THRR 40-80% of Max Heartrate  60-119    Ratings of Perceived Exertion  11-13    Perceived Dyspnea  0-4      Progression   Progression  Continue progressive overload as per policy without signs/symptoms or physical distress.      Resistance Training   Training Prescription  Yes    Weight  Orange bands    Reps  10-15       Perform Capillary Blood Glucose checks as needed.  Exercise Prescription Changes: Exercise Prescription Changes    Row Name 08/16/17 1200 08/18/17 1300 08/25/17 1246 09/06/17 1243 09/08/17 1300     Response to Exercise   Blood Pressure (Admit)  152/62  148/56  150/50  166/80  166/50   Blood Pressure (Exercise)  166/62  190/66  190/80  160/60  142/60   Blood Pressure (Exit)  118/60  170/70   120/50  112/60  140/60   Heart Rate (Admit)  97 bpm  94 bpm  87 bpm  94 bpm  90 bpm   Heart Rate (Exercise)  114 bpm  85 bpm  101 bpm  109 bpm  101 bpm   Heart Rate (Exit)  90 bpm  90 bpm  94 bpm  89 bpm  81 bpm   Oxygen Saturation (Admit)  97 %  95 %  96 %  97 %  96 %   Oxygen Saturation (Exercise)  95 %  96 %  96 %  94 %  96 %   Oxygen Saturation (Exit)  95 %  96 %  97 %  97 %  96 %   Rating of Perceived Exertion (Exercise)  11  _0 Perceived Dyspnea (Exercise)  _1 Duration  Progress to 45 minutes of aerobic exercise without signs/symptoms of physical distress  Progress to 45 minutes of aerobic exercise without signs/symptoms of physical distress  Progress to 45 minutes of aerobic exercise without signs/symptoms of physical distress  Progress to 45 minutes of aerobic exercise without signs/symptoms of physical distress  Progress to 45 minutes of aerobic exercise without signs/symptoms of physical distress   Intensity  Other (comment) 40-80% of HRR  Other (comment) 40-80% of HRR  THRR unchanged  THRR unchanged  THRR unchanged     Progression   Progression  Continue to progress workloads to maintain intensity without signs/symptoms of physical distress.  Continue to progress workloads to maintain intensity without signs/symptoms of physical distress.  Continue to progress workloads to maintain intensity without signs/symptoms of physical distress.  Continue to progress workloads to maintain intensity without signs/symptoms of physical distress.  Continue to progress workloads to maintain intensity without signs/symptoms of physical distress.     Resistance Training   Training Prescription  Yes  Yes  Yes  Yes  Yes   Weight  orange bands  orange bands  orange bands  orange bands  orange bands   Reps  10-15  10-15  10-15  10-15  10-15   Time  10 Minutes  10 Minutes  10 Minutes  10 Minutes  10 Minutes     Bike   Level  0.5  -  0.5  0.5  0.5   Minutes  17  -  _2 NuStep   Level  3  3  -  3  2   Minutes  17  17  -  17  17   METs  2  1.9  -  1.8  2     Track   Laps  _3 -   Minutes  _4 -   Row Name 09/13/17 1200             Response to Exercise   Blood Pressure (Admit)  170/60       Blood Pressure (Exercise)  160/60       Blood Pressure (Exit)  124/60       Heart Rate (Admit)  98 bpm       Heart Rate (Exercise)  108 bpm       Heart Rate (Exit)  87 bpm       Oxygen Saturation (Admit)  96 %       Oxygen Saturation (Exercise)  97 %       Oxygen Saturation (Exit)  96 %       Rating of Perceived Exertion (Exercise)  13       Perceived Dyspnea (Exercise)  3       Duration  Progress to 45 minutes of aerobic exercise without signs/symptoms of physical distress       Intensity  THRR unchanged         Progression   Progression  Continue to progress workloads to maintain intensity without signs/symptoms of physical distress.         Resistance Training   Training Prescription  Yes       Weight  orange bands       Reps  10-15  Time  10 Minutes         Bike   Level  0.8       Minutes  17         NuStep   Level  4       Minutes  17       METs  2.1         Track   Laps  14       Minutes  17          Exercise Comments:   Exercise Goals and Review:   Exercise Goals Re-Evaluation : Exercise Goals Re-Evaluation    Row Name 08/25/17 1658 08/26/17 0709 09/13/17 0744         Exercise Goal Re-Evaluation   Exercise Goals Review  Increase Physical Activity;Increase Strength and Stamina;Able to understand and use rate of perceived exertion (RPE) scale;Able to understand and use Dyspnea scale;Knowledge and understanding of Target Heart Rate Range (THRR);Understanding of Exercise Prescription  Increase Strength and Stamina;Increase Physical Activity;Able to understand and use Dyspnea scale;Able to understand and use rate of perceived exertion (RPE) scale;Knowledge and understanding of Target Heart Rate  Range (THRR);Understanding of Exercise Prescription  Increase Strength and Stamina;Increase Physical Activity;Able to understand and use Dyspnea scale;Able to understand and use rate of perceived exertion (RPE) scale;Knowledge and understanding of Target Heart Rate Range (THRR);Understanding of Exercise Prescription     Comments  -  Patient has only attended three exercise sessions. Will cont. to monitor and progress as able.   Patient has only attended 5 exercise sessions. Patient is showing small improvements. Blood pressure medicine has been adjusted due to high BP's during arrival and exercise. Will cont. to monitor and progress as able.      Expected Outcomes  -  Through exercise at rehab and at home, patient will increase strength and stamina and be able to perform ADL's at home.   Through exercise at rehab and at home, patient will increase strength and stamina and understand how to safely exercise at home.         Discharge Exercise Prescription (Final Exercise Prescription Changes): Exercise Prescription Changes - 09/13/17 1200      Response to Exercise   Blood Pressure (Admit)  170/60    Blood Pressure (Exercise)  160/60    Blood Pressure (Exit)  124/60    Heart Rate (Admit)  98 bpm    Heart Rate (Exercise)  108 bpm    Heart Rate (Exit)  87 bpm    Oxygen Saturation (Admit)  96 %    Oxygen Saturation (Exercise)  97 %    Oxygen Saturation (Exit)  96 %    Rating of Perceived Exertion (Exercise)  13    Perceived Dyspnea (Exercise)  3    Duration  Progress to 45 minutes of aerobic exercise without signs/symptoms of physical distress    Intensity  THRR unchanged      Progression   Progression  Continue to progress workloads to maintain intensity without signs/symptoms of physical distress.      Resistance Training   Training Prescription  Yes    Weight  orange bands    Reps  10-15    Time  10 Minutes      Bike   Level  0.8    Minutes  17      NuStep   Level  4    Minutes   17    METs  2.1  Track   Laps  14    Minutes  17       Nutrition:  Target Goals: Understanding of nutrition guidelines, daily intake of sodium <1563m, cholesterol <2085m calories 30% from fat and 7% or less from saturated fats, daily to have 5 or more servings of fruits and vegetables.  Biometrics: Pre Biometrics - 08/05/17 1041      Pre Biometrics   Grip Strength  18 kg        Nutrition Therapy Plan and Nutrition Goals: Nutrition Therapy & Goals - 09/08/17 1336      Nutrition Therapy   Diet  Carb Modified, Heart Healthy      Personal Nutrition Goals   Nutrition Goal  Identify food quantities necessary to achieve wt loss of  -2# per week to a goal wt loss of 6-24 lb at graduation from pulmonary rehab.      Intervention Plan   Intervention  Prescribe, educate and counsel regarding individualized specific dietary modifications aiming towards targeted core components such as weight, hypertension, lipid management, diabetes, heart failure and other comorbidities.    Expected Outcomes  Short Term Goal: Understand basic principles of dietary content, such as calories, fat, sodium, cholesterol and nutrients.;Long Term Goal: Adherence to prescribed nutrition plan.       Nutrition Discharge: Rate Your Plate Scores: Nutrition Assessments - 09/08/17 1412      Rate Your Plate Scores   Pre Score  50       Nutrition Goals Re-Evaluation:   Nutrition Goals Discharge (Final Nutrition Goals Re-Evaluation):   Psychosocial: Target Goals: Acknowledge presence or absence of significant depression and/or stress, maximize coping skills, provide positive support system. Participant is able to verbalize types and ability to use techniques and skills needed for reducing stress and depression.  Initial Review & Psychosocial Screening: Initial Psych Review & Screening - 08/05/17 1057      Initial Review   Comments  patient is raising her 1673ear old grandson as her son and her two  grandsons       Quality of Life Scores:   PHQ-9: Recent Review Flowsheet Data    [facility] 08/05/2017 04/26/2017   Decreased Interest 0 0   Down, Depressed, Hopeless 0 0   PHQ - 2 Score 0 0     Interpretation of Total Score  Total Score Depression Severity:  1-4 = Minimal depression, 5-9 = Mild depression, 10-14 = Moderate depression, 15-19 = Moderately severe depression, 20-27 = Severe depression   Psychosocial Evaluation and Intervention: Psychosocial Evaluation - 08/05/17 1056      Psychosocial Evaluation & Interventions   Interventions  Encouraged to exercise with the program and follow exercise prescription    Expected Outcomes  patient will remain free from psychosocial barriers to participation in pulmonary rehab    Continue Psychosocial Services   Follow up required by staff       Psychosocial Re-Evaluation: Psychosocial Re-Evaluation    RoValeriaame 08/22/17 1350 09/13/17 1252           Psychosocial Re-Evaluation   Current issues with  Current Stress Concerns  Current Stress Concerns      Comments  patient is raising her 3 grandchildren, 1 as her own. she worries that her health will not allow her to see them marry.  patient is raising her 3 grandchildren, 1 as her own. she worries that her health will not allow her to see them marry.      Expected Outcomes  patient will remain free from psychosocial barriers to participation in pulmonary rehab  patient will remain free from psychosocial barriers to participation in pulmonary rehab      Interventions  Encouraged to attend Pulmonary Rehabilitation for the exercise  Encouraged to attend Pulmonary Rehabilitation for the exercise      Continue Psychosocial Services   Follow up required by staff  Follow up required by staff      Comments  -  patient is raising her 56 year old grandson as her son and her two grandsons        Initial Review   Source of Stress Concerns  -  Family         Psychosocial  Discharge (Final Psychosocial Re-Evaluation): Psychosocial Re-Evaluation - 09/13/17 1252      Psychosocial Re-Evaluation   Current issues with  Current Stress Concerns    Comments  patient is raising her 3 grandchildren, 1 as her own. she worries that her health will not allow her to see them marry.    Expected Outcomes  patient will remain free from psychosocial barriers to participation in pulmonary rehab    Interventions  Encouraged to attend Pulmonary Rehabilitation for the exercise    Continue Psychosocial Services   Follow up required by staff    Comments  patient is raising her 66 year old grandson as her son and her two grandsons      Initial Review   Source of Stress Concerns  Family       Education: Education Goals: Education classes will be provided on a weekly basis, covering required topics. Participant will state understanding/return demonstration of topics presented.  Learning Barriers/Preferences: Learning Barriers/Preferences - 08/05/17 1054      Learning Barriers/Preferences   Learning Barriers  None    Learning Preferences  Verbal Instruction       Education Topics: Risk Factor Reduction:  -Group instruction that is supported by a PowerPoint presentation. Instructor discusses the definition of a risk factor, different risk factors for pulmonary disease, and how the heart and lungs work together.     Nutrition for Pulmonary Patient:  -Group instruction provided by PowerPoint slides, verbal discussion, and written materials to support subject matter. The instructor gives an explanation and review of healthy diet recommendations, which includes a discussion on weight management, recommendations for fruit and vegetable consumption, as well as protein, fluid, caffeine, fiber, sodium, sugar, and alcohol. Tips for eating when patients are short of breath are discussed.   Pursed Lip Breathing:  -Group instruction that is supported by demonstration and informational  handouts. Instructor discusses the benefits of pursed lip and diaphragmatic breathing and detailed demonstration on how to preform both.     Oxygen Safety:  -Group instruction provided by PowerPoint, verbal discussion, and written material to support subject matter. There is an overview of "What is Oxygen" and "Why do we need it".  Instructor also reviews how to create a safe environment for oxygen use, the importance of using oxygen as prescribed, and the risks of noncompliance. There is a brief discussion on traveling with oxygen and resources the patient may utilize.   Oxygen Equipment:  -Group instruction provided by [facility] Staff utilizing handouts, written materials, and equipment demonstrations.   PULMONARY REHAB OTHER RESPIRATORY from 09/08/2017 in [place]  Date  09/08/17  Educator  Ace Gins  Instruction Review Code  2- meets goals/outcomes      Signs and Symptoms:  -Group instruction provided by written  material and verbal discussion to support subject matter. Warning signs and symptoms of infection, stroke, and heart attack are reviewed and when to call the physician/911 reinforced. Tips for preventing the spread of infection discussed.   Advanced Directives:  -Group instruction provided by verbal instruction and written material to support subject matter. Instructor reviews Advanced Directive laws and proper instruction for filling out document.   Pulmonary Video:  -Group video education that reviews the importance of medication and oxygen compliance, exercise, good nutrition, pulmonary hygiene, and pursed lip and diaphragmatic breathing for the pulmonary patient.   Exercise for the Pulmonary Patient:  -Group instruction that is supported by a PowerPoint presentation. Instructor discusses benefits of exercise, core components of exercise, frequency, duration, and intensity of an exercise routine, importance of utilizing pulse oximetry  during exercise, safety while exercising, and options of places to exercise outside of rehab.     PULMONARY REHAB OTHER RESPIRATORY from 09/08/2017 in [place]  Date  08/25/17  Educator  Cloyde Reams  Instruction Review Code  2- meets goals/outcomes      Pulmonary Medications:  -Verbally interactive group education provided by instructor with focus on inhaled medications and proper administration.   Anatomy and Physiology of the Respiratory System and Intimacy:  -Group instruction provided by PowerPoint, verbal discussion, and written material to support subject matter. Instructor reviews respiratory cycle and anatomical components of the respiratory system and their functions. Instructor also reviews differences in obstructive and restrictive respiratory diseases with examples of each. Intimacy, Sex, and Sexuality differences are reviewed with a discussion on how relationships can change when diagnosed with pulmonary disease. Common sexual concerns are reviewed.   MD DAY -A group question and answer session with a medical doctor that allows participants to ask questions that relate to their pulmonary disease state.   OTHER EDUCATION -Group or individual verbal, written, or video instructions that support the educational goals of the pulmonary rehab program.   PULMONARY REHAB OTHER RESPIRATORY from 09/08/2017 in [place]  Date  08/18/17  Educator  RD  Instruction Review Code  1- Verbalizes Understanding      Knowledge Questionnaire Score: Knowledge Questionnaire Score - 08/17/17 0918      Knowledge Questionnaire Score   Pre Score  8/13       Core Components/Risk Factors/Patient Goals at Admission: Personal Goals and Risk Factors at Admission - 08/05/17 1055      Core Components/Risk Factors/Patient Goals on Admission    Weight Management  Obesity;Yes    Intervention  Weight Management: Develop a combined nutrition  and exercise program designed to reach desired caloric intake, while maintaining appropriate intake of nutrient and fiber, sodium and fats, and appropriate energy expenditure required for the weight goal.;Obesity: Provide education and appropriate resources to help participant work on and attain dietary goals.;Weight Management/Obesity: Establish reasonable short term and long term weight goals.;Weight Management: Provide education and appropriate resources to help participant work on and attain dietary goals.    Expected Outcomes  Understanding of distribution of calorie intake throughout the day with the consumption of 4-5 meals/snacks;Understanding recommendations for meals to include 15-35% energy as protein, 25-35% energy from fat, 35-60% energy from carbohydrates, less than 224m of dietary cholesterol, 20-35 gm of total fiber daily;Weight Loss: Understanding of general recommendations for a balanced deficit meal plan, which promotes 1-2 lb weight loss per week and includes a negative energy balance of [id number] kcal/d;Short Term: Continue to assess and modify interventions until  short term weight is achieved    Improve shortness of breath with ADL's  Yes    Intervention  Provide education, individualized exercise plan and daily activity instruction to help decrease symptoms of SOB with activities of daily living.    Expected Outcomes  Short Term: Achieves a reduction of symptoms when performing activities of daily living.    Develop more efficient breathing techniques such as purse lipped breathing and diaphragmatic breathing; and practicing self-pacing with activity  Yes    Intervention  Provide education, demonstration and support about specific breathing techniuqes utilized for more efficient breathing. Include techniques such as pursed lipped breathing, diaphragmatic breathing and self-pacing activity.    Expected Outcomes  Short Term: Participant will be able to demonstrate and use breathing  techniques as needed throughout daily activities.       Core Components/Risk Factors/Patient Goals Review:  Goals and Risk Factor Review    Row Name 08/22/17 1349 09/13/17 1249           Core Components/Risk Factors/Patient Goals Review   Personal Goals Review  Weight Management/Obesity;Improve shortness of breath with ADL's;Develop more efficient breathing techniques such as purse lipped breathing and diaphragmatic breathing and practicing self-pacing with activity.  Weight Management/Obesity;Improve shortness of breath with ADL's;Develop more efficient breathing techniques such as purse lipped breathing and diaphragmatic breathing and practicing self-pacing with activity.      Review  patient has only attended a few sessions since admission and it is too early to evaluate progress towards pulmonary rehab goals  Patient has been limited to workload increases related to high BP. She is not seeing improvement in her shortness of breath at this time. She has seen her cardiologist and has routein appointments for medication adjustments. BP better today. Once BP stable patient will be encouraged to work harder and workloads will be increases at a pace that patient can physicall tolerate. She is observed utilizing PLB during exertion. Her weight remains stable at 89.2 which is a 2.2 lb loss since admission.      Expected Outcomes  see admission expected outcomes  see admission expected outcomes         Core Components/Risk Factors/Patient Goals at Discharge (Final Review):  Goals and Risk Factor Review - 09/13/17 1249      Core Components/Risk Factors/Patient Goals Review   Personal Goals Review  Weight Management/Obesity;Improve shortness of breath with ADL's;Develop more efficient breathing techniques such as purse lipped breathing and diaphragmatic breathing and practicing self-pacing with activity.    Review  Patient has been limited to workload increases related to high BP. She is not seeing  improvement in her shortness of breath at this time. She has seen her cardiologist and has routein appointments for medication adjustments. BP better today. Once BP stable patient will be encouraged to work harder and workloads will be increases at a pace that patient can physicall tolerate. She is observed utilizing PLB during exertion. Her weight remains stable at 89.2 which is a 2.2 lb loss since admission.    Expected Outcomes  see admission expected outcomes       ITP Comments:   Comments: patient has attended 6 sessions since admission

## 2017-09-16 ENCOUNTER — Ambulatory Visit (INDEPENDENT_AMBULATORY_CARE_PROVIDER_SITE_OTHER): Payer: Medicare Other | Admitting: Pharmacist

## 2017-09-16 VITALS — BP 128/62 | HR 94

## 2017-09-16 DIAGNOSIS — I10 Essential (primary) hypertension: Secondary | ICD-10-CM

## 2017-09-16 NOTE — Patient Instructions (Signed)
Return for a follow up appointment as scheduled with Dr. Radford Pax  Check your blood pressure at home daily (if able) and keep record of the readings.  Take your BP meds as follows: Continue all as prescribed.   Bring all of your meds, your BP cuff and your record of home blood pressures to your next appointment.  Exercise as you're able, try to walk approximately 30 minutes per day.  Keep salt intake to a minimum, especially watch canned and prepared boxed foods.  Eat more fresh fruits and vegetables and fewer canned items.  Avoid eating in fast food restaurants.    HOW TO TAKE YOUR BLOOD PRESSURE: . Rest 5 minutes before taking your blood pressure. .  Don't smoke or drink caffeinated beverages for at least 30 minutes before. . Take your blood pressure before (not after) you eat. . Sit comfortably with your back supported and both feet on the floor (don't cross your legs). . Elevate your arm to heart level on a table or a desk. . Use the proper sized cuff. It should fit smoothly and snugly around your bare upper arm. There should be enough room to slip a fingertip under the cuff. The bottom edge of the cuff should be 1 inch above the crease of the elbow. . Ideally, take 3 measurements at one sitting and record the average.

## 2017-09-16 NOTE — Progress Notes (Signed)
Patient ID: Alice Cole                 DOB: September 29, 1946                      MRN: [id number]     HPI: Alice Cole is a 71 y.o. female patient of Dr. Radford Pax who presents today for hypertension evaluation.  PMH includes obstructive sleep apnea not compliant with C Pap, PVCs and hypertension. History normal coronary arteries on cath in 2003, no ischemia on nuclear stress test 2017 and 2-D echo with normal LV function with diastolic dysfunction.  She was recently seen by Ermalinda Barrios, PA at which time her diltiazem was increased due to elevated pressures at pulmonary rehab.   She presents today and states her pressures have improved since the dose increase. She is feeling well and denies any dizziness, SOB, headache, or fatigue.    Current HTN meds:  Ramipril 10mg  BID Diltiazem 360mg  daily HCTZ 25mg  daily   BP goal: <130/80  Family History: Mother passed of MI at 59. Brother passed in 20s from cardiac issues.   Social History: No tobacco products or alcohol.   Diet: most prepared from home. Has been avoiding salt recently.   Exercise: Has been doing cardiac/pulmonary rehab.   Home BP readings: this morning 115/60 with HR 89  Wt Readings from Last 3 Encounters:  09/13/17 196 lb 10.4 oz (89.2 kg)  09/08/17 201 lb 1 oz (91.2 kg)  09/06/17 196 lb 10.4 oz (89.2 kg)   BP Readings from Last 3 Encounters:  09/16/17 128/62  09/05/17 (!) 170/66  08/05/17 (!) 163/75   Pulse Readings from Last 3 Encounters:  09/16/17 94  09/05/17 95  08/05/17 87    Renal function: CrCl cannot be calculated (Patient's most recent lab result is older than the maximum 21 days allowed.).  Past Medical History:  Diagnosis Date  . Abnormal EKG 07/09/2016  . Abnormal liver function   . Adenomatous colon polyp   . Bigeminy   . Chest pain   . Colonic polyp   . Dermatitis   . Diabetes mellitus   . Diverticulitis    recurrent  . DVT (deep venous thrombosis) ([place])   . Esophageal stricture   . GERD  (gastroesophageal reflux disease)   . Heart palpitations   . Hemorrhoids   . Hyperlipidemia   . Hypertension   . Obstructive sleep apnea on CPAP    pt does not use  . OSA (obstructive sleep apnea) 07/09/2016   She has not been using her CPAP device because she needs new supplies  . PVC's (premature ventricular contractions)     Current Outpatient Medications on File Prior to Visit  Medication Sig Dispense Refill  . aspirin EC 81 MG tablet Take 1 tablet (81 mg total) by mouth daily.    Marland Kitchen diltiazem (CARDIZEM CD) 360 MG 24 hr capsule Take 1 capsule (360 mg total) by mouth daily. 90 capsule 3  . hydrochlorothiazide (HYDRODIURIL) 25 MG tablet Take 25 mg by mouth daily.    . Insulin Aspart (NOVOLOG [place]) Inject 42 Units into the skin 3 (three) times daily. 42 units morning, 42 units lunch, 54 units supper    . metFORMIN (GLUCOPHAGE-XR) 500 MG 24 hr tablet TAKE 4 TABLETS EVERY DAY WITH EVENING MEAL  5  . NOVOLOG FLEXPEN 100 UNIT/ML FlexPen     . omeprazole (PRILOSEC) 20 MG capsule Take 20 mg by mouth daily.    Marland Kitchen  pravastatin (PRAVACHOL) 40 MG tablet Take 40 mg by mouth daily.  11  . ramipril (ALTACE) 10 MG capsule Take 10 mg by mouth 2 (two) times daily.    Marland Kitchen dicyclomine (BENTYL) 20 MG tablet 1 TABLET FOUR TIMES A DAY BEFORE MEALS ORALLY 30 DAY(S)  5   No current facility-administered medications on file prior to visit.     Allergies  Allergen Reactions  . Metoprolol Anaphylaxis  . Nortriptyline Anaphylaxis  . Olmesartan Anaphylaxis  . Tradjenta [Linagliptin] Shortness Of Breath  . Alatrofloxacin Dermatitis  . Benicar [Olmesartan Medoxomil] Nausea Only and Other (See Comments)    Urinary difficulty  . Beta Adrenergic Blockers Other (See Comments)    Reaction: Low Heart Rate  . Cartia Xt [Diltiazem] Itching  . Cephalexin Nausea And Vomiting    REACTION: nausea, vomiting, diarrhea  . Codeine Other (See Comments)    tachycardia  . Colesevelam Nausea Only  . Crestor [Rosuvastatin  Calcium] Other (See Comments)    Leg cramps  . Flagyl [Metronidazole] Nausea Only  . Fluvastatin Sodium Itching  . Levemir [Insulin Detemir] Itching and Other (See Comments)    Bad mood  . Lisinopril Nausea Only  . Metoprolol Succinate Other (See Comments)    Low heart rate  . Nortriptyline Hcl Other (See Comments)    Reaction unknown  . Pamelor [Nortriptyline Hcl] Other (See Comments)    Nightmares  . Rosuvastatin Other (See Comments)    REACTION: cramps  . Sulfamethoxazole-Trimethoprim Nausea Only and Other (See Comments)    REACTION: nausea, irreg. heartrate  . Verapamil Swelling and Other (See Comments)    Chest pain  . Welchol [Colesevelam Hcl] Nausea And Vomiting  . Adhesive [Tape] Rash    Band Aids  . Sitagliptin Rash  . Trovan [Trovafloxacin] Rash and Other (See Comments)    Blood pressure 128/62, pulse 94.   Assessment/Plan: Hypertension: BP at goal today. Np changes with medications. Advised to continue work out program and call with any changes in pressures. Follow up with Dr. Radford Pax as scheduled and hypertension clinic as needed.    Thank you, Lelan Pons. Patterson Hammersmith, [place]

## 2017-09-20 ENCOUNTER — Encounter (HOSPITAL_COMMUNITY): Payer: Medicare Other

## 2017-09-21 ENCOUNTER — Encounter: Payer: Self-pay | Admitting: Pharmacist

## 2017-09-22 ENCOUNTER — Encounter (HOSPITAL_COMMUNITY): Payer: Medicare Other

## 2017-09-27 ENCOUNTER — Encounter (HOSPITAL_COMMUNITY): Payer: Medicare Other

## 2017-09-29 ENCOUNTER — Encounter (HOSPITAL_COMMUNITY)
Admission: RE | Admit: 2017-09-29 | Discharge: 2017-09-29 | Disposition: A | Discharge status: Home / Self Care | Payer: Medicare Other | Source: Ambulatory Visit | Attending: Cardiology | Admitting: Cardiology

## 2017-09-29 VITALS — Wt 199.7 lb

## 2017-09-29 DIAGNOSIS — R0602 Shortness of breath: Secondary | ICD-10-CM

## 2017-09-29 DIAGNOSIS — R5381 Other malaise: Secondary | ICD-10-CM | POA: Diagnosis not present

## 2017-09-29 NOTE — Progress Notes (Signed)
Daily Session Note  Patient Details  Name: Alice Cole MRN: [id number] Date of Birth: 1945-10-28 Referring Provider:     Pulmonary Rehab Walk Test from 08/09/2017 in [place]  Referring Provider  Dr. Chase Caller      Encounter Date: 09/29/2017  Check In: Session Check In - 09/29/17 1118      Check-In   Location  [facility]    Staff Present  Su Hilt, MS, ACSM RCEP, Exercise Physiologist;Jerald Villalona [place]ro Evert, RN;Portia Rollene Rotunda, RN, BSN    Supervising physician immediately available to respond to emergencies  [facility] immediately available    Physician(s)  Dr. Wendee Beavers    Medication changes reported      No    Fall or balance concerns reported     No    Tobacco Cessation  No Change    Warm-up and Cool-down  Performed as group-led instruction    Resistance Training Performed  Yes    VAD Patient?  No      Pain Assessment   Currently in Pain?  No/denies    Multiple Pain Sites  No       Capillary Blood Glucose: No results found for this or any previous visit (from the past 24 hour(s)). POCT Glucose - 09/29/17 1321      POCT Blood Glucose   Pre-Exercise  123 mg/dL [place] to drink before exercise    Post-Exercise  137 mg/dL    Pre-Exercise #2  118 mg/dL      Exercise Prescription Changes - 09/29/17 1300      Response to Exercise   Blood Pressure (Admit)  148/60    Blood Pressure (Exercise)  184/74 recheck during exercise 164/60    Blood Pressure (Exit)  130/60    Heart Rate (Admit)  89 bpm    Heart Rate (Exercise)  106 bpm    Heart Rate (Exit)  87 bpm    Oxygen Saturation (Admit)  96 %    Oxygen Saturation (Exercise)  98 %    Oxygen Saturation (Exit)  95 %    Rating of Perceived Exertion (Exercise)  13    Perceived Dyspnea (Exercise)  3    Duration  Progress to 45 minutes of aerobic exercise without signs/symptoms of physical distress    Intensity  THRR unchanged      Progression   Progression  Continue to  progress workloads to maintain intensity without signs/symptoms of physical distress.      Resistance Training   Training Prescription  Yes    Weight  orange bands    Reps  10-15    Time  10 Minutes      Bike   Level  0.8    Minutes  17      Track   Laps  9    Minutes  17       Social History   Tobacco Use  Smoking Status Never Smoker  Smokeless Tobacco Never Used    Goals Met:  Exercise tolerated well No report of cardiac concerns or symptoms Strength training completed today  Goals Unmet:  Not Applicable  Comments: Service time is from 1030 to [place]    Dr. Rush Farmer is Medical Director for Pulmonary Rehab at [facility].

## 2017-10-06 ENCOUNTER — Encounter (HOSPITAL_COMMUNITY)
Admission: RE | Admit: 2017-10-06 | Discharge: 2017-10-06 | Disposition: A | Discharge status: Home / Self Care | Payer: Medicare Other | Source: Ambulatory Visit | Attending: Internal Medicine | Admitting: Internal Medicine

## 2017-10-06 VITALS — Wt 201.7 lb

## 2017-10-06 DIAGNOSIS — R0602 Shortness of breath: Secondary | ICD-10-CM

## 2017-10-06 DIAGNOSIS — R5381 Other malaise: Secondary | ICD-10-CM | POA: Diagnosis not present

## 2017-10-06 NOTE — Progress Notes (Signed)
Pulmonary Individual Treatment Plan  Patient Details  Name: Alice Cole MRN: [id number] Date of Birth: [birth year] Referring Provider:     Pulmonary Rehab Walk Test from 08/09/2017 in [place]  Referring Provider  Dr. Chase Caller      Initial Encounter Date:    Pulmonary Rehab Walk Test from 08/09/2017 in [place]  Date  08/09/17  Referring Provider  Dr. Chase Caller      Visit Diagnosis: Shortness of breath  Patient's Home Medications on Admission:   Current Outpatient Medications:  .  aspirin EC 81 MG tablet, Take 1 tablet (81 mg total) by mouth daily., Disp: , Rfl:  .  dicyclomine (BENTYL) 20 MG tablet, 1 TABLET FOUR TIMES A DAY BEFORE MEALS ORALLY 30 DAY(S), Disp: , Rfl: 5 .  diltiazem (CARDIZEM CD) 360 MG 24 hr capsule, Take 1 capsule (360 mg total) by mouth daily., Disp: 90 capsule, Rfl: 3 .  hydrochlorothiazide (HYDRODIURIL) 25 MG tablet, Take 25 mg by mouth daily., Disp: , Rfl:  .  Insulin Aspart (NOVOLOG [place]), Inject 42 Units into the skin 3 (three) times daily. 42 units morning, 42 units lunch, 54 units supper, Disp: , Rfl:  .  metFORMIN (GLUCOPHAGE-XR) 500 MG 24 hr tablet, TAKE 4 TABLETS EVERY DAY WITH EVENING MEAL, Disp: , Rfl: 5 .  NOVOLOG FLEXPEN 100 UNIT/ML FlexPen, , Disp: , Rfl:  .  omeprazole (PRILOSEC) 20 MG capsule, Take 20 mg by mouth daily., Disp: , Rfl:  .  pravastatin (PRAVACHOL) 40 MG tablet, Take 40 mg by mouth daily., Disp: , Rfl: 11 .  ramipril (ALTACE) 10 MG capsule, Take 10 mg by mouth 2 (two) times daily., Disp: , Rfl:   Past Medical History: Past Medical History:  Diagnosis Date  . Abnormal EKG 07/09/2016  . Abnormal liver function   . Adenomatous colon polyp   . Bigeminy   . Chest pain   . Colonic polyp   . Dermatitis   . Diabetes mellitus   . Diverticulitis    recurrent  . DVT (deep venous thrombosis) ([place])   . Esophageal stricture   . GERD (gastroesophageal reflux disease)    . Heart palpitations   . Hemorrhoids   . Hyperlipidemia   . Hypertension   . Obstructive sleep apnea on CPAP    pt does not use  . OSA (obstructive sleep apnea) 07/09/2016   She has not been using her CPAP device because she needs new supplies  . PVC's (premature ventricular contractions)     Tobacco Use: Social History   Tobacco Use  Smoking Status Never Smoker  Smokeless Tobacco Never Used    Labs: Recent Review Flowsheet Data    Labs for ITP Cardiac and Pulmonary Rehab Latest Ref Rng & Units 11/27/2009 12/06/2009 08/25/2010 03/21/2012 12/20/2012   Cholestrol 0 - 200 mg/dL 265 ATP III CLASSIFICATION: <200     mg/dL   Desirable 200-239  mg/dL   Borderline High >=240    mg/dL   High(H) - 194 ATP III CLASSIFICATION: <200     mg/dL   Desirable 200-239  mg/dL   Borderline High >=240    mg/dL   High - -   LDLCALC 0 - 99 mg/dL 181 Total Cholesterol/HDL:CHD Risk Coronary Heart Disease Risk Table Men   Women 1/2 Average Risk   3.4   3.3 Average Risk       5.0   4.4 2 X Average Risk   9.6  7.1 3 X Average Risk  23.4   11.0 Use the calculated Patient Ratio above and the CHD Risk Table to determine the patient's CHD Risk. ATP III CLASSIFICATION (LDL): <100     mg/dL   Optimal 100-129  mg/dL   Near or Above Optimal 130-159  mg/dL   Borderline 160-189  mg/dL   High >190     mg/dL   Very High(H) - 121 Total Cholesterol/HDL:CHD Risk Coronary Heart Disease Risk Table Men   Women 1/2 Average Risk   3.4   3.3 Average Risk       5.0   4.4 2 X Average Risk   9.6   7.1 3 X Average Risk  23.4   11.0 Use the calculated Patient Ratio above and the CHD Risk Table to determine the patient's CHD Risk. ATP III CLASSIFICATION (LDL): <100     mg/dL   Optimal 100-129  mg/dL   Near or Above Optimal 130-159  mg/dL   Borderline 160-189  mg/dL   High >190     mg/dL   Very High(H) - -   HDL >39 mg/dL 35(L) - 38(L) - -   Trlycerides <150 mg/dL 244(H) - 175(H) - -   Hemoglobin A1c 4.6 -  6.5 % - - - - 8.6(H)   TCO2 0 - 100 mmol/L - 26 - 23 -      Capillary Blood Glucose: Lab Results  Component Value Date   GLUCAP 192 (H) 02/20/2014   GLUCAP 208 (H) 12/14/2012   GLUCAP 185 (H) 12/13/2012   GLUCAP 187 (H) 12/13/2012   GLUCAP 205 (H) 07/26/2012   POCT Glucose    Row Name 08/16/17 1214 08/18/17 1333 08/25/17 1251 09/06/17 1248 09/08/17 1352     POCT Blood Glucose   Pre-Exercise  220 mg/dL  243 mg/dL  190 mg/dL  180 mg/dL  258 mg/dL   Post-Exercise  166 mg/dL  147 mg/dL  113 mg/dL  140 mg/dL  149 mg/dL   Row Name 09/13/17 1235 09/29/17 1321           POCT Blood Glucose   Pre-Exercise  238 mg/dL  123 mg/dL [place] to drink before exercise      Post-Exercise  248 mg/dL  137 mg/dL      Pre-Exercise #2  -  118 mg/dL         Pulmonary Assessment Scores: Pulmonary Assessment Scores    Row Name 08/11/17 0847 08/17/17 0918       ADL UCSD   ADL Phase  Entry  Entry    SOB Score total  -  43      CAT Score   CAT Score  -  17 Entry      mMRC Score   mMRC Score  2  -       Pulmonary Function Assessment: Pulmonary Function Assessment - 08/05/17 1054      Breath   Bilateral Breath Sounds  Clear;Decreased    Shortness of Breath  Yes;Limiting activity       Exercise Target Goals:    Exercise Program Goal: Individual exercise prescription set with THRR, safety & activity barriers. Participant demonstrates ability to understand and report RPE using BORG scale, to self-measure pulse accurately, and to acknowledge the importance of the exercise prescription.  Exercise Prescription Goal: Starting with aerobic activity 30 plus minutes a day, 3 days per week for initial exercise prescription. Provide home exercise prescription and guidelines that participant acknowledges understanding prior to discharge.  Activity Barriers & Risk Stratification: Activity Barriers & Cardiac Risk Stratification - 08/05/17 1040      Activity Barriers & Cardiac Risk  Stratification   Activity Barriers  Joint Problems;Deconditioning;Shortness of Breath right knee       6 Minute Walk: 6 Minute Walk    Row Name 08/11/17 0834         6 Minute Walk   Phase  Initial     Distance  1500 feet     Walk Time  6 minutes     # of Rest Breaks  0     MPH  2.84     METS  3.14     RPE  11     Perceived Dyspnea   2     Symptoms  Yes (comment)     Comments  1/10 knee pain     Resting HR  81 bpm     Resting BP  158/64     Resting Oxygen Saturation   96 %     Exercise Oxygen Saturation  during 6 min walk  94 %     Max Ex. HR  119 bpm     Max Ex. BP  200/64     2 Minute Post BP  154/60       Interval HR   1 Minute HR  81     2 Minute HR  95     3 Minute HR  112     4 Minute HR  114     5 Minute HR  118     6 Minute HR  119     2 Minute Post HR  106     Interval Heart Rate?  Yes       Interval Oxygen   Interval Oxygen?  Yes     Baseline Oxygen Saturation %  96 %     1 Minute Oxygen Saturation %  95 %     1 Minute Liters of Oxygen  0 L     2 Minute Oxygen Saturation %  94 %     2 Minute Liters of Oxygen  0 L     3 Minute Oxygen Saturation %  94 %     3 Minute Liters of Oxygen  0 L     4 Minute Oxygen Saturation %  94 %     4 Minute Liters of Oxygen  0 L     5 Minute Oxygen Saturation %  94 %     5 Minute Liters of Oxygen  0 L     6 Minute Oxygen Saturation %  94 %     6 Minute Liters of Oxygen  0 L     2 Minute Post Oxygen Saturation %  95 %     2 Minute Post Liters of Oxygen  0 L        Oxygen Initial Assessment: Oxygen Initial Assessment - 08/11/17 0847      Initial 6 min Walk   Oxygen Used  None      Program Oxygen Prescription   Program Oxygen Prescription  None       Oxygen Re-Evaluation: Oxygen Re-Evaluation    Row Name 08/22/17 1348 09/13/17 1248 10/06/17 0651         Program Oxygen Prescription   Program Oxygen Prescription  None  None  None       Home Oxygen   Home Oxygen Device  None  None  None  Sleep Oxygen  Prescription  CPAP  CPAP  CPAP     Liters per minute  0  0  0     Home Exercise Oxygen Prescription  None  None  None     Home at Rest Exercise Oxygen Prescription  None  None  None     Compliance with Home Oxygen Use  No  No does not tolerate CPAP at HS  No        Oxygen Discharge (Final Oxygen Re-Evaluation): Oxygen Re-Evaluation - 10/06/17 0651      Program Oxygen Prescription   Program Oxygen Prescription  None      Home Oxygen   Home Oxygen Device  None    Sleep Oxygen Prescription  CPAP    Liters per minute  0    Home Exercise Oxygen Prescription  None    Home at Rest Exercise Oxygen Prescription  None    Compliance with Home Oxygen Use  No       Initial Exercise Prescription: Initial Exercise Prescription - 08/11/17 0800      Date of Initial Exercise RX and Referring Provider   Date  08/09/17    Referring Provider  Dr. Chase Caller      Bike   Level  0.5    Minutes  17      NuStep   Level  2    Minutes  17    METs  1.5      Track   Laps  15    Minutes  17      Prescription Details   Frequency (times per week)  2    Duration  Progress to 45 minutes of aerobic exercise without signs/symptoms of physical distress      Intensity   THRR 40-80% of Max Heartrate  60-119    Ratings of Perceived Exertion  11-13    Perceived Dyspnea  0-4      Progression   Progression  Continue progressive overload as per policy without signs/symptoms or physical distress.      Resistance Training   Training Prescription  Yes    Weight  Orange bands    Reps  10-15       Perform Capillary Blood Glucose checks as needed.  Exercise Prescription Changes: Exercise Prescription Changes    Row Name 08/16/17 1200 08/18/17 1300 08/25/17 1246 09/06/17 1243 09/08/17 1300     Response to Exercise   Blood Pressure (Admit)  152/62  148/56  150/50  166/80  166/50   Blood Pressure (Exercise)  166/62  190/66  190/80  160/60  142/60   Blood Pressure (Exit)  118/60  170/70  120/50   112/60  140/60   Heart Rate (Admit)  97 bpm  94 bpm  87 bpm  94 bpm  90 bpm   Heart Rate (Exercise)  114 bpm  85 bpm  101 bpm  109 bpm  101 bpm   Heart Rate (Exit)  90 bpm  90 bpm  94 bpm  89 bpm  81 bpm   Oxygen Saturation (Admit)  97 %  95 %  96 %  97 %  96 %   Oxygen Saturation (Exercise)  95 %  96 %  96 %  94 %  96 %   Oxygen Saturation (Exit)  95 %  96 %  97 %  97 %  96 %   Rating of Perceived Exertion (Exercise)  '11  13  11  11  '$ 13  Perceived Dyspnea (Exercise)  '1  1  1  1  1   '$ Duration  Progress to 45 minutes of aerobic exercise without signs/symptoms of physical distress  Progress to 45 minutes of aerobic exercise without signs/symptoms of physical distress  Progress to 45 minutes of aerobic exercise without signs/symptoms of physical distress  Progress to 45 minutes of aerobic exercise without signs/symptoms of physical distress  Progress to 45 minutes of aerobic exercise without signs/symptoms of physical distress   Intensity  Other (comment) 40-80% of HRR  Other (comment) 40-80% of HRR  THRR unchanged  THRR unchanged  THRR unchanged     Progression   Progression  Continue to progress workloads to maintain intensity without signs/symptoms of physical distress.  Continue to progress workloads to maintain intensity without signs/symptoms of physical distress.  Continue to progress workloads to maintain intensity without signs/symptoms of physical distress.  Continue to progress workloads to maintain intensity without signs/symptoms of physical distress.  Continue to progress workloads to maintain intensity without signs/symptoms of physical distress.     Resistance Training   Training Prescription  Yes  Yes  Yes  Yes  Yes   Weight  orange bands  orange bands  orange bands  orange bands  orange bands   Reps  10-15  10-15  10-15  10-15  10-15   Time  10 Minutes  10 Minutes  10 Minutes  10 Minutes  10 Minutes     Bike   Level  0.5  -  0.5  0.5  0.5   Minutes  17  -  '17  17  17      '$ NuStep   Level  3  3  -  3  2   Minutes  17  17  -  17  17   METs  2  1.9  -  1.8  2     Track   Laps  '11  11  14  13  '$ -   Minutes  '17  17  17  17  '$ -   Row Name 09/13/17 1200 09/29/17 1300           Response to Exercise   Blood Pressure (Admit)  170/60  148/60      Blood Pressure (Exercise)  160/60  184/74 recheck during exercise 164/60      Blood Pressure (Exit)  124/60  130/60      Heart Rate (Admit)  98 bpm  89 bpm      Heart Rate (Exercise)  108 bpm  106 bpm      Heart Rate (Exit)  87 bpm  87 bpm      Oxygen Saturation (Admit)  96 %  96 %      Oxygen Saturation (Exercise)  97 %  98 %      Oxygen Saturation (Exit)  96 %  95 %      Rating of Perceived Exertion (Exercise)  13  13      Perceived Dyspnea (Exercise)  3  3      Duration  Progress to 45 minutes of aerobic exercise without signs/symptoms of physical distress  Progress to 45 minutes of aerobic exercise without signs/symptoms of physical distress      Intensity  THRR unchanged  THRR unchanged        Progression   Progression  Continue to progress workloads to maintain intensity without signs/symptoms of physical distress.  Continue to progress workloads to maintain intensity without signs/symptoms  of physical distress.        Resistance Training   Training Prescription  Yes  Yes      Weight  orange bands  orange bands      Reps  10-15  10-15      Time  10 Minutes  10 Minutes        Bike   Level  0.8  0.8      Minutes  17  17        NuStep   Level  4  -      Minutes  17  -      METs  2.1  -        Track   Laps  14  9      Minutes  17  17         Exercise Comments:   Exercise Goals and Review:   Exercise Goals Re-Evaluation : Exercise Goals Re-Evaluation    Row Name 08/25/17 1658 08/26/17 0709 09/13/17 0744 09/29/17 0752       Exercise Goal Re-Evaluation   Exercise Goals Review  Increase Physical Activity;Increase Strength and Stamina;Able to understand and use rate of perceived exertion (RPE)  scale;Able to understand and use Dyspnea scale;Knowledge and understanding of Target Heart Rate Range (THRR);Understanding of Exercise Prescription  Increase Strength and Stamina;Increase Physical Activity;Able to understand and use Dyspnea scale;Able to understand and use rate of perceived exertion (RPE) scale;Knowledge and understanding of Target Heart Rate Range (THRR);Understanding of Exercise Prescription  Increase Strength and Stamina;Increase Physical Activity;Able to understand and use Dyspnea scale;Able to understand and use rate of perceived exertion (RPE) scale;Knowledge and understanding of Target Heart Rate Range (THRR);Understanding of Exercise Prescription  Increase Strength and Stamina;Increase Physical Activity;Able to understand and use Dyspnea scale;Able to understand and use rate of perceived exertion (RPE) scale;Knowledge and understanding of Target Heart Rate Range (THRR);Understanding of Exercise Prescription    Comments  -  Patient has only attended three exercise sessions. Will cont. to monitor and progress as able.   Patient has only attended 5 exercise sessions. Patient is showing small improvements. Blood pressure medicine has been adjusted due to high BP's during arrival and exercise. Will cont. to monitor and progress as able.   Patient has only attended 6 exercise sessions. Patient is showing small improvements. Blood pressure medicine has been adjusted due to high BP's during arrival and exercise. Will cont. to monitor and progress as able.     Expected Outcomes  -  Through exercise at rehab and at home, patient will increase strength and stamina and be able to perform ADL's at home.   Through exercise at rehab and at home, patient will increase strength and stamina and understand how to safely exercise at home.   Through exercise at rehab and at home, patient will increase strength and stamina making ADL's easier to perform. Patient will also have a better understanding of safe  exercise and what they are capable to do outside of clinical supervision.       Discharge Exercise Prescription (Final Exercise Prescription Changes): Exercise Prescription Changes - 09/29/17 1300      Response to Exercise   Blood Pressure (Admit)  148/60    Blood Pressure (Exercise)  184/74 recheck during exercise 164/60    Blood Pressure (Exit)  130/60    Heart Rate (Admit)  89 bpm    Heart Rate (Exercise)  106 bpm    Heart Rate (Exit)  87 bpm  Oxygen Saturation (Admit)  96 %    Oxygen Saturation (Exercise)  98 %    Oxygen Saturation (Exit)  95 %    Rating of Perceived Exertion (Exercise)  13    Perceived Dyspnea (Exercise)  3    Duration  Progress to 45 minutes of aerobic exercise without signs/symptoms of physical distress    Intensity  THRR unchanged      Progression   Progression  Continue to progress workloads to maintain intensity without signs/symptoms of physical distress.      Resistance Training   Training Prescription  Yes    Weight  orange bands    Reps  10-15    Time  10 Minutes      Bike   Level  0.8    Minutes  17      Track   Laps  9    Minutes  17       Nutrition:  Target Goals: Understanding of nutrition guidelines, daily intake of sodium '1500mg'$ , cholesterol '200mg'$ , calories 30% from fat and 7% or less from saturated fats, daily to have 5 or more servings of fruits and vegetables.  Biometrics: Pre Biometrics - 08/05/17 1041      Pre Biometrics   Grip Strength  18 kg        Nutrition Therapy Plan and Nutrition Goals: Nutrition Therapy & Goals - 09/08/17 1336      Nutrition Therapy   Diet  Carb Modified, Heart Healthy      Personal Nutrition Goals   Nutrition Goal  Identify food quantities necessary to achieve wt loss of  -2# per week to a goal wt loss of 6-24 lb at graduation from pulmonary rehab.      Intervention Plan   Intervention  Prescribe, educate and counsel regarding individualized specific dietary modifications aiming  towards targeted core components such as weight, hypertension, lipid management, diabetes, heart failure and other comorbidities.    Expected Outcomes  Short Term Goal: Understand basic principles of dietary content, such as calories, fat, sodium, cholesterol and nutrients.;Long Term Goal: Adherence to prescribed nutrition plan.       Nutrition Discharge: Rate Your Plate Scores: Nutrition Assessments - 09/08/17 1412      Rate Your Plate Scores   Pre Score  50       Nutrition Goals Re-Evaluation:   Nutrition Goals Discharge (Final Nutrition Goals Re-Evaluation):   Psychosocial: Target Goals: Acknowledge presence or absence of significant depression and/or stress, maximize coping skills, provide positive support system. Participant is able to verbalize types and ability to use techniques and skills needed for reducing stress and depression.  Initial Review & Psychosocial Screening: Initial Psych Review & Screening - 08/05/17 1057      Initial Review   Comments  patient is raising her 8 year old grandson as her son and her two grandsons       Quality of Life Scores:   PHQ-9: Recent Review Flowsheet Data    Depression screen [facility] 2/9 08/05/2017 04/26/2017   Decreased Interest 0 0   Down, Depressed, Hopeless 0 0   PHQ - 2 Score 0 0     Interpretation of Total Score  Total Score Depression Severity:  1-4 = Minimal depression, 5-9 = Mild depression, 10-14 = Moderate depression, 15-19 = Moderately severe depression, 20-27 = Severe depression   Psychosocial Evaluation and Intervention: Psychosocial Evaluation - 08/05/17 1056      Psychosocial Evaluation & Interventions   Interventions  Encouraged to exercise  with the program and follow exercise prescription    Expected Outcomes  patient will remain free from psychosocial barriers to participation in pulmonary rehab    Continue Psychosocial Services   Follow up required by staff       Psychosocial  Re-Evaluation: Psychosocial Re-Evaluation    [place] Name 08/22/17 1350 09/13/17 1252 10/06/17 0654         Psychosocial Re-Evaluation   Current issues with  Current Stress Concerns  Current Stress Concerns  Current Stress Concerns     Comments  patient is raising her 3 grandchildren, 1 as her own. she worries that her health will not allow her to see them marry.  patient is raising her 3 grandchildren, 1 as her own. she worries that her health will not allow her to see them marry.  patient is raising her 3 grandchildren, 1 as her own. she worries that her health will not allow her to see them marry.     Expected Outcomes  patient will remain free from psychosocial barriers to participation in pulmonary rehab  patient will remain free from psychosocial barriers to participation in pulmonary rehab  patient will remain free from psychosocial barriers to participation in pulmonary rehab     Interventions  Encouraged to attend Pulmonary Rehabilitation for the exercise  Encouraged to attend Pulmonary Rehabilitation for the exercise  Encouraged to attend Pulmonary Rehabilitation for the exercise     Continue Psychosocial Services   Follow up required by staff  Follow up required by staff  Follow up required by staff     Comments  -  patient is raising her 31 year old grandson as her son and her two grandsons  -       Initial Review   Source of Stress Concerns  -  Family  Family        Psychosocial Discharge (Final Psychosocial Re-Evaluation): Psychosocial Re-Evaluation - 10/06/17 1610      Psychosocial Re-Evaluation   Current issues with  Current Stress Concerns    Comments  patient is raising her 3 grandchildren, 1 as her own. she worries that her health will not allow her to see them marry.    Expected Outcomes  patient will remain free from psychosocial barriers to participation in pulmonary rehab    Interventions  Encouraged to attend Pulmonary Rehabilitation for the exercise    Continue  Psychosocial Services   Follow up required by staff      Initial Review   Source of Stress Concerns  Family       Education: Education Goals: Education classes will be provided on a weekly basis, covering required topics. Participant will state understanding/return demonstration of topics presented.  Learning Barriers/Preferences: Learning Barriers/Preferences - 08/05/17 1054      Learning Barriers/Preferences   Learning Barriers  None    Learning Preferences  Verbal Instruction       Education Topics: Risk Factor Reduction:  -Group instruction that is supported by a PowerPoint presentation. Instructor discusses the definition of a risk factor, different risk factors for pulmonary disease, and how the heart and lungs work together.     Nutrition for Pulmonary Patient:  -Group instruction provided by PowerPoint slides, verbal discussion, and written materials to support subject matter. The instructor gives an explanation and review of healthy diet recommendations, which includes a discussion on weight management, recommendations for fruit and vegetable consumption, as well as protein, fluid, caffeine, fiber, sodium, sugar, and alcohol. Tips for eating when patients are short  of breath are discussed.   Pursed Lip Breathing:  -Group instruction that is supported by demonstration and informational handouts. Instructor discusses the benefits of pursed lip and diaphragmatic breathing and detailed demonstration on how to preform both.     Oxygen Safety:  -Group instruction provided by PowerPoint, verbal discussion, and written material to support subject matter. There is an overview of "What is Oxygen" and "Why do we need it".  Instructor also reviews how to create a safe environment for oxygen use, the importance of using oxygen as prescribed, and the risks of noncompliance. There is a brief discussion on traveling with oxygen and resources the patient may utilize.   Oxygen Equipment:   -Group instruction provided by [facility] Staff utilizing handouts, written materials, and equipment demonstrations.   PULMONARY REHAB OTHER RESPIRATORY from 09/29/2017 in [place]  Date  09/08/17  Educator  Ace Gins  Instruction Review Code  2- meets goals/outcomes      Signs and Symptoms:  -Group instruction provided by written material and verbal discussion to support subject matter. Warning signs and symptoms of infection, stroke, and heart attack are reviewed and when to call the physician/911 reinforced. Tips for preventing the spread of infection discussed.   Advanced Directives:  -Group instruction provided by verbal instruction and written material to support subject matter. Instructor reviews Advanced Directive laws and proper instruction for filling out document.   Pulmonary Video:  -Group video education that reviews the importance of medication and oxygen compliance, exercise, good nutrition, pulmonary hygiene, and pursed lip and diaphragmatic breathing for the pulmonary patient.   Exercise for the Pulmonary Patient:  -Group instruction that is supported by a PowerPoint presentation. Instructor discusses benefits of exercise, core components of exercise, frequency, duration, and intensity of an exercise routine, importance of utilizing pulse oximetry during exercise, safety while exercising, and options of places to exercise outside of rehab.     PULMONARY REHAB OTHER RESPIRATORY from 09/29/2017 in [place]  Date  08/25/17  Educator  Cloyde Reams  Instruction Review Code  2- meets goals/outcomes      Pulmonary Medications:  -Verbally interactive group education provided by instructor with focus on inhaled medications and proper administration.   Anatomy and Physiology of the Respiratory System and Intimacy:  -Group instruction provided by PowerPoint, verbal discussion, and written material to support subject  matter. Instructor reviews respiratory cycle and anatomical components of the respiratory system and their functions. Instructor also reviews differences in obstructive and restrictive respiratory diseases with examples of each. Intimacy, Sex, and Sexuality differences are reviewed with a discussion on how relationships can change when diagnosed with pulmonary disease. Common sexual concerns are reviewed.   PULMONARY REHAB OTHER RESPIRATORY from 09/29/2017 in [place]  Date  09/29/17  Educator  RN  Instruction Review Code  2- meets goals/outcomes      MD DAY -A group question and answer session with a medical doctor that allows participants to ask questions that relate to their pulmonary disease state.   OTHER EDUCATION -Group or individual verbal, written, or video instructions that support the educational goals of the pulmonary rehab program.   PULMONARY REHAB OTHER RESPIRATORY from 09/29/2017 in [place]  Date  08/18/17  Educator  RD  Instruction Review Code  1- Verbalizes Understanding      Knowledge Questionnaire Score: Knowledge Questionnaire Score - 08/17/17 0918      Knowledge Questionnaire Score  Pre Score  8/13       Core Components/Risk Factors/Patient Goals at Admission: Personal Goals and Risk Factors at Admission - 08/05/17 1055      Core Components/Risk Factors/Patient Goals on Admission    Weight Management  Obesity;Yes    Intervention  Weight Management: Develop a combined nutrition and exercise program designed to reach desired caloric intake, while maintaining appropriate intake of nutrient and fiber, sodium and fats, and appropriate energy expenditure required for the weight goal.;Obesity: Provide education and appropriate resources to help participant work on and attain dietary goals.;Weight Management/Obesity: Establish reasonable short term and long term weight goals.;Weight Management:  Provide education and appropriate resources to help participant work on and attain dietary goals.    Expected Outcomes  Understanding of distribution of calorie intake throughout the day with the consumption of 4-5 meals/snacks;Understanding recommendations for meals to include 15-35% energy as protein, 25-35% energy from fat, 35-60% energy from carbohydrates, less than '200mg'$  of dietary cholesterol, 20-35 gm of total fiber daily;Weight Loss: Understanding of general recommendations for a balanced deficit meal plan, which promotes 1-2 lb weight loss per week and includes a negative energy balance of [phone] kcal/d;Short Term: Continue to assess and modify interventions until short term weight is achieved    Improve shortness of breath with ADL's  Yes    Intervention  Provide education, individualized exercise plan and daily activity instruction to help decrease symptoms of SOB with activities of daily living.    Expected Outcomes  Short Term: Achieves a reduction of symptoms when performing activities of daily living.    Develop more efficient breathing techniques such as purse lipped breathing and diaphragmatic breathing; and practicing self-pacing with activity  Yes    Intervention  Provide education, demonstration and support about specific breathing techniuqes utilized for more efficient breathing. Include techniques such as pursed lipped breathing, diaphragmatic breathing and self-pacing activity.    Expected Outcomes  Short Term: Participant will be able to demonstrate and use breathing techniques as needed throughout daily activities.       Core Components/Risk Factors/Patient Goals Review:  Goals and Risk Factor Review    Row Name 08/22/17 1349 09/13/17 1249 10/06/17 0973         Core Components/Risk Factors/Patient Goals Review   Personal Goals Review  Weight Management/Obesity;Improve shortness of breath with ADL's;Develop more efficient breathing techniques such as purse lipped breathing  and diaphragmatic breathing and practicing self-pacing with activity.  Weight Management/Obesity;Improve shortness of breath with ADL's;Develop more efficient breathing techniques such as purse lipped breathing and diaphragmatic breathing and practicing self-pacing with activity.  Weight Management/Obesity;Improve shortness of breath with ADL's;Develop more efficient breathing techniques such as purse lipped breathing and diaphragmatic breathing and practicing self-pacing with activity.     Review  patient has only attended a few sessions since admission and it is too early to evaluate progress towards pulmonary rehab goals  Patient has been limited to workload increases related to high BP. She is not seeing improvement in her shortness of breath at this time. She has seen her cardiologist and has routein appointments for medication adjustments. BP better today. Once BP stable patient will be encouraged to work harder and workloads will be increases at a pace that patient can physicall tolerate. She is observed utilizing PLB during exertion. Her weight remains stable at 89.2 which is a 2.2 lb loss since admission.  Patient is doing well in pulmonary rehab. She has followed up with her Cardiologist for high BP and has  had several appointment for titration of her medication. Her BP appears to be controlled at this point and we are able to increase her workloads as tolerated. Expected to see greater progression over the next 30 days.     Expected Outcomes  see admission expected outcomes  see admission expected outcomes  see admission expected outcomes        Core Components/Risk Factors/Patient Goals at Discharge (Final Review):  Goals and Risk Factor Review - 10/06/17 0652      Core Components/Risk Factors/Patient Goals Review   Personal Goals Review  Weight Management/Obesity;Improve shortness of breath with ADL's;Develop more efficient breathing techniques such as purse lipped breathing and diaphragmatic  breathing and practicing self-pacing with activity.    Review  Patient is doing well in pulmonary rehab. She has followed up with her Cardiologist for high BP and has had several appointment for titration of her medication. Her BP appears to be controlled at this point and we are able to increase her workloads as tolerated. Expected to see greater progression over the next 30 days.    Expected Outcomes  see admission expected outcomes       ITP Comments:   Comments: Patient has attended 7 sessions since admission to pulmonary rehab.

## 2017-10-06 NOTE — Progress Notes (Signed)
Daily Session Note  Patient Details  Name: Alice Cole MRN: [id number] Date of Birth: 30-Jun-1946 Referring Provider:     Pulmonary Rehab Walk Test from 08/09/2017 in [place]  Referring Provider  Dr. Chase Caller      Encounter Date: 10/06/2017  Check In: Session Check In - 10/06/17 1030      Check-In   Location  [facility]    Staff Present  Rosebud Poles, RN, BSN;Ramon Dredge, RN, MHA;Portia Rollene Rotunda, RN, Roque Cash, RN    Supervising physician immediately available to respond to emergencies  [facility] immediately available    Physician(s)  Dr. Rockne Menghini    Medication changes reported      No    Fall or balance concerns reported     No    Tobacco Cessation  No Change    Warm-up and Cool-down  Performed as group-led instruction    Resistance Training Performed  Yes    VAD Patient?  No      Pain Assessment   Currently in Pain?  No/denies    Multiple Pain Sites  No       Capillary Blood Glucose: No results found for this or any previous visit (from the past 24 hour(s)). POCT Glucose - 10/06/17 1243      POCT Blood Glucose   Pre-Exercise  123 mg/dL    Post-Exercise  146 mg/dL      Exercise Prescription Changes - 10/06/17 1200      Response to Exercise   Blood Pressure (Admit)  144/64    Blood Pressure (Exercise)  170/54    Blood Pressure (Exit)  124/70    Heart Rate (Admit)  89 bpm    Heart Rate (Exercise)  108 bpm    Heart Rate (Exit)  87 bpm    Oxygen Saturation (Admit)  98 %    Oxygen Saturation (Exercise)  96 %    Oxygen Saturation (Exit)  97 %    Rating of Perceived Exertion (Exercise)  11    Perceived Dyspnea (Exercise)  3    Duration  Progress to 45 minutes of aerobic exercise without signs/symptoms of physical distress    Intensity  THRR unchanged      Progression   Progression  Continue to progress workloads to maintain intensity without signs/symptoms of physical distress.      Resistance  Training   Training Prescription  Yes    Weight  orange bands    Reps  10-15    Time  10 Minutes      Bike   Level  0.8    Minutes  17      NuStep   Level  4    Minutes  17    METs  2.1      Track   Laps  14    Minutes  17       Social History   Tobacco Use  Smoking Status Never Smoker  Smokeless Tobacco Never Used    Goals Met:  Exercise tolerated well No report of cardiac concerns or symptoms Strength training completed today  Goals Unmet:  Not Applicable  Comments: Service time is from 1030 to 1215    Dr. Rush Farmer is Medical Director for Pulmonary Rehab at [facility].

## 2017-10-12 ENCOUNTER — Ambulatory Visit: Payer: Medicare Other | Admitting: Podiatry

## 2017-10-13 ENCOUNTER — Encounter (HOSPITAL_COMMUNITY): Payer: Medicare Other

## 2017-10-18 ENCOUNTER — Encounter (HOSPITAL_COMMUNITY)
Admission: RE | Admit: 2017-10-18 | Discharge: 2017-10-18 | Disposition: A | Discharge status: Home / Self Care | Payer: Medicare Other | Source: Ambulatory Visit | Attending: Internal Medicine | Admitting: Internal Medicine

## 2017-10-18 ENCOUNTER — Ambulatory Visit: Payer: Medicare Other | Admitting: Cardiology

## 2017-10-18 VITALS — Wt 202.4 lb

## 2017-10-18 DIAGNOSIS — R0602 Shortness of breath: Secondary | ICD-10-CM | POA: Diagnosis not present

## 2017-10-18 DIAGNOSIS — R5381 Other malaise: Secondary | ICD-10-CM | POA: Insufficient documentation

## 2017-10-18 NOTE — Progress Notes (Signed)
Daily Session Note  Patient Details  Name: Alice Cole MRN: [id number] Date of Birth: 15-Nov-1945 Referring Provider:     Pulmonary Rehab Walk Test from 08/09/2017 in [place]  Referring Provider  Dr. Chase Caller      Encounter Date: 10/18/2017  Check In: Session Check In - 10/18/17 1030      Check-In   Location  [facility]    Staff Present  Rosebud Poles, RN, Luisa Hart, RN, BSN;Molly diVincenzo, MS, ACSM RCEP, Exercise Physiologist;Amarachi Kotz Ysidro Evert, RN    Supervising physician immediately available to respond to emergencies  [facility] immediately available    Physician(s)  DrMarland Kitchen Eliseo Squires    Medication changes reported      No    Fall or balance concerns reported     No    Tobacco Cessation  No Change    Warm-up and Cool-down  Performed as group-led instruction    Resistance Training Performed  Yes    VAD Patient?  No      Pain Assessment   Multiple Pain Sites  No       Capillary Blood Glucose: No results found for this or any previous visit (from the past 24 hour(s)). POCT Glucose - 10/18/17 1431      POCT Blood Glucose   Pre-Exercise  206 mg/dL    Post-Exercise  147 mg/dL      Exercise Prescription Changes - 10/18/17 1400      Response to Exercise   Blood Pressure (Admit)  136/72    Blood Pressure (Exercise)  160/68    Blood Pressure (Exit)  124/54    Heart Rate (Admit)  98 bpm    Heart Rate (Exercise)  113 bpm    Heart Rate (Exit)  96 bpm    Oxygen Saturation (Admit)  95 %    Oxygen Saturation (Exercise)  95 %    Oxygen Saturation (Exit)  93 %    Rating of Perceived Exertion (Exercise)  11    Perceived Dyspnea (Exercise)  1    Duration  Progress to 45 minutes of aerobic exercise without signs/symptoms of physical distress    Intensity  THRR unchanged      Progression   Progression  Continue to progress workloads to maintain intensity without signs/symptoms of physical distress.      Resistance Training    Training Prescription  Yes    Weight  orange bands    Reps  10-15    Time  10 Minutes      Bike   Level  0.8    Minutes  17      NuStep   Level  4    Minutes  17    METs  1.8      Track   Laps  19    Minutes  17       Social History   Tobacco Use  Smoking Status Never Smoker  Smokeless Tobacco Never Used    Goals Met:  Exercise tolerated well No report of cardiac concerns or symptoms Strength training completed today  Goals Unmet:  Not Applicable  Comments: Service time is from 1030 to 1205    Dr. Rush Farmer is Medical Director for Pulmonary Rehab at [facility]

## 2017-10-20 ENCOUNTER — Encounter (HOSPITAL_COMMUNITY)
Admission: RE | Admit: 2017-10-20 | Discharge: 2017-10-20 | Disposition: A | Discharge status: Home / Self Care | Payer: Medicare Other | Source: Ambulatory Visit | Attending: Cardiology | Admitting: Cardiology

## 2017-10-20 VITALS — Wt 200.0 lb

## 2017-10-20 DIAGNOSIS — R0602 Shortness of breath: Secondary | ICD-10-CM

## 2017-10-20 DIAGNOSIS — R5381 Other malaise: Secondary | ICD-10-CM | POA: Diagnosis not present

## 2017-10-20 NOTE — Progress Notes (Signed)
Daily Session Note  Patient Details  Name: Alice Cole MRN: [id number] Date of Birth: [birth year] Referring Provider:     Pulmonary Rehab Walk Test from 08/09/2017 in [place]  Referring Provider  Dr. Chase Caller      Encounter Date: 10/20/2017  Check In: Session Check In - 10/20/17 1234      Check-In   Location  [facility]    Staff Present  Trish Fountain, RN, BSN;Rane Blitch, MS, ACSM RCEP, Exercise Physiologist;Joan [place] Reeves, RN, Roque Cash, RN    Supervising physician immediately available to respond to emergencies  [facility] immediately available    Physician(s)  Dr. Cruzita Lederer    Medication changes reported      No    Fall or balance concerns reported     No    Tobacco Cessation  No Change    Warm-up and Cool-down  Performed as group-led instruction    Resistance Training Performed  Yes    VAD Patient?  No      Pain Assessment   Currently in Pain?  No/denies    Multiple Pain Sites  No       Capillary Blood Glucose: No results found for this or any previous visit (from the past 24 hour(s)).  Exercise Prescription Changes - 10/20/17 1200      Response to Exercise   Blood Pressure (Admit)  150/66    Blood Pressure (Exercise)  120/60    Blood Pressure (Exit)  130/76    Heart Rate (Admit)  85 bpm    Heart Rate (Exercise)  115 bpm    Heart Rate (Exit)  93 bpm    Oxygen Saturation (Admit)  96 %    Oxygen Saturation (Exercise)  96 %    Oxygen Saturation (Exit)  95 %    Rating of Perceived Exertion (Exercise)  11    Perceived Dyspnea (Exercise)  1    Duration  Progress to 45 minutes of aerobic exercise without signs/symptoms of physical distress    Intensity  THRR unchanged      Progression   Progression  Continue to progress workloads to maintain intensity without signs/symptoms of physical distress.      Resistance Training   Training Prescription  Yes    Weight  orange bands    Reps  10-15    Time   10 Minutes      NuStep   Level  6    Minutes  17    METs  2.6      Track   Laps  17    Minutes  17       Social History   Tobacco Use  Smoking Status Never Smoker  Smokeless Tobacco Never Used    Goals Met:  Exercise tolerated well No report of cardiac concerns or symptoms Strength training completed today  Goals Unmet:  Not Applicable  Comments: Service time is from 10:30a to 12:30p    Dr. Rush Farmer is Medical Director for Pulmonary Rehab at [facility].

## 2017-10-24 ENCOUNTER — Other Ambulatory Visit: Payer: Self-pay | Admitting: Cardiology

## 2017-10-25 ENCOUNTER — Encounter (HOSPITAL_COMMUNITY): Payer: Medicare Other

## 2017-10-25 ENCOUNTER — Encounter (HOSPITAL_COMMUNITY)
Admission: RE | Admit: 2017-10-25 | Discharge: 2017-10-25 | Disposition: A | Discharge status: Home / Self Care | Payer: Medicare Other | Source: Ambulatory Visit | Attending: Cardiology | Admitting: Cardiology

## 2017-10-25 VITALS — Wt 200.2 lb

## 2017-10-25 DIAGNOSIS — R0602 Shortness of breath: Secondary | ICD-10-CM

## 2017-10-25 DIAGNOSIS — R5381 Other malaise: Secondary | ICD-10-CM | POA: Diagnosis not present

## 2017-10-25 NOTE — Progress Notes (Signed)
Daily Session Note  Patient Details  Name: Alice Cole MRN: [id number] Date of Birth: [birth year] Referring Provider:     Pulmonary Rehab Walk Test from 08/09/2017 in [place]  Referring Provider  Dr. Chase Caller      Encounter Date: 10/25/2017  Check In: Session Check In - 10/25/17 1330      Check-In   Location  [facility]  (Pended)     Staff Present  Rosebud Poles, RN, BSN;Portia Rollene Rotunda, RN, BSN;Molly diVincenzo, MS, ACSM RCEP, Exercise Physiologist;Camil Hausmann Ysidro Evert, RN  (Pended)     Supervising physician immediately available to respond to emergencies  [facility] immediately available  (Pended)     Physician(s)  Dr. Eliseo Squires  (Pended)     Medication changes reported      No  (Pended)     Fall or balance concerns reported     No  (Pended)     Tobacco Cessation  No Change  (Pended)     Warm-up and Cool-down  Performed as group-led instruction  (Pended)     Resistance Training Performed  Yes  (Pended)     VAD Patient?  No  (Pended)       Pain Assessment   Currently in Pain?  No/denies  (Pended)     Multiple Pain Sites  No  (Pended)        Capillary Blood Glucose: No results found for this or any previous visit (from the past 24 hour(s)). POCT Glucose - 10/25/17 1512      POCT Blood Glucose   Pre-Exercise  143 mg/dL    Post-Exercise  106 mg/dL      Exercise Prescription Changes - 10/25/17 1500      Response to Exercise   Blood Pressure (Admit)  146/64    Blood Pressure (Exercise)  160/68    Blood Pressure (Exit)  158/64    Heart Rate (Admit)  99 bpm    Heart Rate (Exercise)  101 bpm    Heart Rate (Exit)  84 bpm    Oxygen Saturation (Admit)  96 %    Oxygen Saturation (Exercise)  96 %    Oxygen Saturation (Exit)  97 %    Rating of Perceived Exertion (Exercise)  11    Perceived Dyspnea (Exercise)  1    Duration  Progress to 45 minutes of aerobic exercise without signs/symptoms of physical distress    Intensity  THRR  unchanged      Progression   Progression  Continue to progress workloads to maintain intensity without signs/symptoms of physical distress.      Resistance Training   Training Prescription  Yes    Weight  orange bands    Reps  10-15    Time  10 Minutes      Bike   Level  0.8    Minutes  17      NuStep   Level  4    Minutes  17    METs  2.1      Track   Laps  15    Minutes  17       Social History   Tobacco Use  Smoking Status Never Smoker  Smokeless Tobacco Never Used    Goals Met:  Exercise tolerated well No report of cardiac concerns or symptoms Strength training completed today  Goals Unmet:  Not Applicable  Comments: Service time is from 1330 to 1450    Dr. Rush Farmer is Medical  Director for Pulmonary Rehab at [facility]. 

## 2017-10-25 NOTE — Progress Notes (Signed)
I have reviewed a Home Exercise Prescription with Alice Cole . Alice Cole is not currently exercising at home.  The patient was advised to walk 2-3 days a week for 30 minutes.  Alice Cole and I discussed how to progress their exercise prescription.  The patient stated that their goals were to decrease weight, increase energy, make ADL's easier, and decrease fatgiue.  The patient stated that they understand the exercise prescription.  We reviewed exercise guidelines, target heart rate during exercise, oxygen use, weather, home pulse oximeter, endpoints for exercise, and goals.  Patient is encouraged to come to me with any questions. I will continue to follow up with the patient to assist them with progression and safety.

## 2017-10-27 ENCOUNTER — Encounter (HOSPITAL_COMMUNITY): Payer: Medicare Other

## 2017-10-27 DIAGNOSIS — I10 Essential (primary) hypertension: Secondary | ICD-10-CM | POA: Diagnosis not present

## 2017-10-27 DIAGNOSIS — M549 Dorsalgia, unspecified: Secondary | ICD-10-CM | POA: Diagnosis not present

## 2017-11-01 ENCOUNTER — Encounter (HOSPITAL_COMMUNITY)
Admission: RE | Admit: 2017-11-01 | Discharge: 2017-11-01 | Disposition: A | Discharge status: Home / Self Care | Payer: Medicare Other | Source: Ambulatory Visit | Attending: Family Medicine | Admitting: Family Medicine

## 2017-11-01 VITALS — Wt 198.4 lb

## 2017-11-01 DIAGNOSIS — R0602 Shortness of breath: Secondary | ICD-10-CM | POA: Diagnosis not present

## 2017-11-01 DIAGNOSIS — R5381 Other malaise: Secondary | ICD-10-CM | POA: Diagnosis not present

## 2017-11-01 NOTE — Progress Notes (Signed)
Daily Session Note  Patient Details  Name: Alice Cole MRN: [id number] Date of Birth: [birth year] Referring Provider:     Pulmonary Rehab Walk Test from 08/09/2017 in [place]  Referring Provider  Dr. Chase Caller      Encounter Date: 11/01/2017  Check In: Session Check In - 11/01/17 1228      Check-In   Location  [facility]    Staff Present  Trish Fountain, RN, BSN;Molly diVincenzo, MS, ACSM RCEP, Exercise Physiologist;Joan [place] Reeves, RN, Roque Cash, RN    Supervising physician immediately available to respond to emergencies  [facility] immediately available    Physician(s)  Dr. Zigmund Daniel    Medication changes reported      No    Fall or balance concerns reported     No    Tobacco Cessation  No Change    Warm-up and Cool-down  Performed as group-led instruction    Resistance Training Performed  Yes    VAD Patient?  No      Pain Assessment   Currently in Pain?  No/denies    Multiple Pain Sites  No       Capillary Blood Glucose: No results found for this or any previous visit (from the past 24 hour(s)). POCT Glucose - 11/01/17 1238      POCT Blood Glucose   Pre-Exercise  180 mg/dL    Post-Exercise  173 mg/dL      Exercise Prescription Changes - 11/01/17 1200      Response to Exercise   Blood Pressure (Admit)  134/68    Blood Pressure (Exercise)  140/78    Blood Pressure (Exit)  120/56    Heart Rate (Admit)  93 bpm    Heart Rate (Exercise)  117 bpm    Heart Rate (Exit)  98 bpm    Oxygen Saturation (Admit)  96 %    Oxygen Saturation (Exercise)  95 %    Oxygen Saturation (Exit)  97 %    Rating of Perceived Exertion (Exercise)  13    Perceived Dyspnea (Exercise)  1    Duration  Progress to 45 minutes of aerobic exercise without signs/symptoms of physical distress    Intensity  THRR unchanged      Progression   Progression  Continue to progress workloads to maintain intensity without signs/symptoms of physical  distress.      Resistance Training   Training Prescription  Yes    Weight  orange bands    Reps  10-15    Time  10 Minutes      Bike   Level  0.8    Minutes  17      NuStep   Level  4    Minutes  17    METs  1.9      Track   Laps  15    Minutes  17       Social History   Tobacco Use  Smoking Status Never Smoker  Smokeless Tobacco Never Used    Goals Met:  Exercise tolerated well No report of cardiac concerns or symptoms Strength training completed today  Goals Unmet:  Not Applicable  Comments: Service time is from 1030 to 1205    Dr. Rush Farmer is Medical Director for Pulmonary Rehab at [facility].

## 2017-11-01 NOTE — Progress Notes (Signed)
Pulmonary Individual Treatment Plan  Patient Details  Name: Alice Cole MRN: [id number] Date of Birth: [birth year] Referring Provider:     Pulmonary Rehab Walk Test from 08/09/2017 in [place]  Referring Provider  Dr. Chase Caller      Initial Encounter Date:    Pulmonary Rehab Walk Test from 08/09/2017 in [place]  Date  08/09/17  Referring Provider  Dr. Chase Caller      Visit Diagnosis: Shortness of breath  Patient's Home Medications on Admission:   Current Outpatient Medications:  .  aspirin EC 81 MG tablet, Take 1 tablet (81 mg total) by mouth daily., Disp: , Rfl:  .  dicyclomine (BENTYL) 20 MG tablet, 1 TABLET FOUR TIMES A DAY BEFORE MEALS ORALLY 30 DAY(S), Disp: , Rfl: 5 .  diltiazem (CARDIZEM CD) 360 MG 24 hr capsule, Take 1 capsule (360 mg total) by mouth daily., Disp: 90 capsule, Rfl: 3 .  hydrochlorothiazide (HYDRODIURIL) 25 MG tablet, Take 25 mg by mouth daily., Disp: , Rfl:  .  Insulin Aspart (NOVOLOG [place]), Inject 42 Units into the skin 3 (three) times daily. 42 units morning, 42 units lunch, 54 units supper, Disp: , Rfl:  .  metFORMIN (GLUCOPHAGE-XR) 500 MG 24 hr tablet, TAKE 4 TABLETS EVERY DAY WITH EVENING MEAL, Disp: , Rfl: 5 .  NOVOLOG FLEXPEN 100 UNIT/ML FlexPen, , Disp: , Rfl:  .  omeprazole (PRILOSEC) 20 MG capsule, Take 20 mg by mouth daily., Disp: , Rfl:  .  pravastatin (PRAVACHOL) 40 MG tablet, Take 40 mg by mouth daily., Disp: , Rfl: 11 .  ramipril (ALTACE) 10 MG capsule, Take 10 mg by mouth 2 (two) times daily., Disp: , Rfl:   Past Medical History: Past Medical History:  Diagnosis Date  . Abnormal EKG 07/09/2016  . Abnormal liver function   . Adenomatous colon polyp   . Bigeminy   . Chest pain   . Colonic polyp   . Dermatitis   . Diabetes mellitus   . Diverticulitis    recurrent  . DVT (deep venous thrombosis) ([place])   . Esophageal stricture   . GERD (gastroesophageal reflux disease)    . Heart palpitations   . Hemorrhoids   . Hyperlipidemia   . Hypertension   . Obstructive sleep apnea on CPAP    pt does not use  . OSA (obstructive sleep apnea) 07/09/2016   She has not been using her CPAP device because she needs new supplies  . PVC's (premature ventricular contractions)     Tobacco Use: Social History   Tobacco Use  Smoking Status Never Smoker  Smokeless Tobacco Never Used    Labs: Recent Review Flowsheet Data    Labs for ITP Cardiac and Pulmonary Rehab Latest Ref Rng & Units 11/27/2009 12/06/2009 08/25/2010 03/21/2012 12/20/2012   Cholestrol 0 - 200 mg/dL 265 ATP III CLASSIFICATION: <200     mg/dL   Desirable 200-239  mg/dL   Borderline High >=240    mg/dL   High(H) - 194 ATP III CLASSIFICATION: <200     mg/dL   Desirable 200-239  mg/dL   Borderline High >=240    mg/dL   High - -   LDLCALC 0 - 99 mg/dL 181 Total Cholesterol/HDL:CHD Risk Coronary Heart Disease Risk Table Men   Women 1/2 Average Risk   3.4   3.3 Average Risk       5.0   4.4 2 X Average Risk   9.6  7.1 3 X Average Risk  23.4   11.0 Use the calculated Patient Ratio above and the CHD Risk Table to determine the patient's CHD Risk. ATP III CLASSIFICATION (LDL): <100     mg/dL   Optimal 100-129  mg/dL   Near or Above Optimal 130-159  mg/dL   Borderline 160-189  mg/dL   High >190     mg/dL   Very High(H) - 121 Total Cholesterol/HDL:CHD Risk Coronary Heart Disease Risk Table Men   Women 1/2 Average Risk   3.4   3.3 Average Risk       5.0   4.4 2 X Average Risk   9.6   7.1 3 X Average Risk  23.4   11.0 Use the calculated Patient Ratio above and the CHD Risk Table to determine the patient's CHD Risk. ATP III CLASSIFICATION (LDL): <100     mg/dL   Optimal 100-129  mg/dL   Near or Above Optimal 130-159  mg/dL   Borderline 160-189  mg/dL   High >190     mg/dL   Very High(H) - -   HDL >39 mg/dL 35(L) - 38(L) - -   Trlycerides <150 mg/dL 244(H) - 175(H) - -   Hemoglobin A1c 4.6 -  6.5 % - - - - 8.6(H)   TCO2 0 - 100 mmol/L - 26 - 23 -      Capillary Blood Glucose: Lab Results  Component Value Date   GLUCAP 192 (H) 02/20/2014   GLUCAP 208 (H) 12/14/2012   GLUCAP 185 (H) 12/13/2012   GLUCAP 187 (H) 12/13/2012   GLUCAP 205 (H) 07/26/2012   POCT Glucose    Row Name 08/16/17 1214 08/18/17 1333 08/25/17 1251 09/06/17 1248 09/08/17 1352     POCT Blood Glucose   Pre-Exercise  220 mg/dL  243 mg/dL  190 mg/dL  180 mg/dL  258 mg/dL   Post-Exercise  166 mg/dL  147 mg/dL  113 mg/dL  140 mg/dL  149 mg/dL   Row Name 09/13/17 1235 09/29/17 1321 10/06/17 1243 10/18/17 1431 10/20/17 1247     POCT Blood Glucose   Pre-Exercise  238 mg/dL  123 mg/dL [place] to drink before exercise  123 mg/dL  206 mg/dL  188 mg/dL   Post-Exercise  248 mg/dL  137 mg/dL  146 mg/dL  147 mg/dL  138 mg/dL   Pre-Exercise #2  -  118 mg/dL  -  -  -   Row Name 10/25/17 1512             POCT Blood Glucose   Pre-Exercise  143 mg/dL       Post-Exercise  106 mg/dL          Pulmonary Assessment Scores: Pulmonary Assessment Scores    Row Name 08/11/17 0847         ADL UCSD   ADL Phase  Entry       mMRC Score   mMRC Score  2        Pulmonary Function Assessment: Pulmonary Function Assessment - 08/05/17 1054      Breath   Bilateral Breath Sounds  Clear;Decreased    Shortness of Breath  Yes;Limiting activity       Exercise Target Goals:    Exercise Program Goal: Individual exercise prescription set using results from initial 6 min walk test and THRR while considering  patient's activity barriers and safety.    Exercise Prescription Goal: Initial exercise prescription builds to 30-45 minutes a day of aerobic activity, 2-3  days per week.  Home exercise guidelines will be given to patient during program as part of exercise prescription that the participant will acknowledge.  Activity Barriers & Risk Stratification: Activity Barriers & Cardiac Risk Stratification - 08/05/17 1040       Activity Barriers & Cardiac Risk Stratification   Activity Barriers  Joint Problems;Deconditioning;Shortness of Breath right knee       6 Minute Walk: 6 Minute Walk    Row Name 08/11/17 0834         6 Minute Walk   Phase  Initial     Distance  1500 feet     Walk Time  6 minutes     # of Rest Breaks  0     MPH  2.84     METS  3.14     RPE  11     Perceived Dyspnea   2     Symptoms  Yes (comment)     Comments  1/10 knee pain     Resting HR  81 bpm     Resting BP  158/64     Resting Oxygen Saturation   96 %     Exercise Oxygen Saturation  during 6 min walk  94 %     Max Ex. HR  119 bpm     Max Ex. BP  200/64     2 Minute Post BP  154/60       Interval HR   1 Minute HR  81     2 Minute HR  95     3 Minute HR  112     4 Minute HR  114     5 Minute HR  118     6 Minute HR  119     2 Minute Post HR  106     Interval Heart Rate?  Yes       Interval Oxygen   Interval Oxygen?  Yes     Baseline Oxygen Saturation %  96 %     1 Minute Oxygen Saturation %  95 %     1 Minute Liters of Oxygen  0 L     2 Minute Oxygen Saturation %  94 %     2 Minute Liters of Oxygen  0 L     3 Minute Oxygen Saturation %  94 %     3 Minute Liters of Oxygen  0 L     4 Minute Oxygen Saturation %  94 %     4 Minute Liters of Oxygen  0 L     5 Minute Oxygen Saturation %  94 %     5 Minute Liters of Oxygen  0 L     6 Minute Oxygen Saturation %  94 %     6 Minute Liters of Oxygen  0 L     2 Minute Post Oxygen Saturation %  95 %     2 Minute Post Liters of Oxygen  0 L        Oxygen Initial Assessment: Oxygen Initial Assessment - 08/11/17 0847      Initial 6 min Walk   Oxygen Used  None      Program Oxygen Prescription   Program Oxygen Prescription  None       Oxygen Re-Evaluation: Oxygen Re-Evaluation    Row Name 08/22/17 1348 09/13/17 1248 10/06/17 0651 10/31/17 1520       Program Oxygen Prescription   Program Oxygen Prescription  None  None  None  None      Home Oxygen    Home Oxygen Device  None  None  None  None    Sleep Oxygen Prescription  CPAP  CPAP  CPAP  CPAP    Liters per minute  0  0  0  0    Home Exercise Oxygen Prescription  None  None  None  None    Home at Rest Exercise Oxygen Prescription  None  None  None  None    Compliance with Home Oxygen Use  No  No does not tolerate CPAP at HS  No  No      Goals/Expected Outcomes   Short Term Goals  -  -  -  To learn and exhibit compliance with exercise, home and travel O2 prescription;To learn and understand importance of monitoring SPO2 with pulse oximeter and demonstrate accurate use of the pulse oximeter.;To learn and understand importance of maintaining oxygen saturations>88%;To learn and demonstrate proper pursed lip breathing techniques or other breathing techniques.;To learn and demonstrate proper use of respiratory medications    Long  Term Goals  -  -  -  Exhibits compliance with exercise, home and travel O2 prescription;Verbalizes importance of monitoring SPO2 with pulse oximeter and return demonstration;Maintenance of O2 saturations>88%;Exhibits proper breathing techniques, such as pursed lip breathing or other method taught during program session;Compliance with respiratory medication;Demonstrates proper use of MDI's    Comments  -  -  -  not compliant with cpap due to the "smell". Encouraged to talk with DR. to get a new machine-reeducated on the importance of use.        Oxygen Discharge (Final Oxygen Re-Evaluation): Oxygen Re-Evaluation - 10/31/17 1520      Program Oxygen Prescription   Program Oxygen Prescription  None      Home Oxygen   Home Oxygen Device  None    Sleep Oxygen Prescription  CPAP    Liters per minute  0    Home Exercise Oxygen Prescription  None    Home at Rest Exercise Oxygen Prescription  None    Compliance with Home Oxygen Use  No      Goals/Expected Outcomes   Short Term Goals  To learn and exhibit compliance with exercise, home and travel O2 prescription;To  learn and understand importance of monitoring SPO2 with pulse oximeter and demonstrate accurate use of the pulse oximeter.;To learn and understand importance of maintaining oxygen saturations>88%;To learn and demonstrate proper pursed lip breathing techniques or other breathing techniques.;To learn and demonstrate proper use of respiratory medications    Long  Term Goals  Exhibits compliance with exercise, home and travel O2 prescription;Verbalizes importance of monitoring SPO2 with pulse oximeter and return demonstration;Maintenance of O2 saturations>88%;Exhibits proper breathing techniques, such as pursed lip breathing or other method taught during program session;Compliance with respiratory medication;Demonstrates proper use of MDI's    Comments  not compliant with cpap due to the "smell". Encouraged to talk with DR. to get a new machine-reeducated on the importance of use.        Initial Exercise Prescription: Initial Exercise Prescription - 08/11/17 0800      Date of Initial Exercise RX and Referring Provider   Date  08/09/17    Referring Provider  Dr. Chase Caller      Bike   Level  0.5    Minutes  17      NuStep   Level  2    Minutes  17  METs  1.5      Track   Laps  15    Minutes  17      Prescription Details   Frequency (times per week)  2    Duration  Progress to 45 minutes of aerobic exercise without signs/symptoms of physical distress      Intensity   THRR 40-80% of Max Heartrate  60-119    Ratings of Perceived Exertion  11-13    Perceived Dyspnea  0-4      Progression   Progression  Continue progressive overload as per policy without signs/symptoms or physical distress.      Resistance Training   Training Prescription  Yes    Weight  Orange bands    Reps  10-15       Perform Capillary Blood Glucose checks as needed.  Exercise Prescription Changes: Exercise Prescription Changes    Row Name 08/16/17 1200 08/18/17 1300 08/25/17 1246 09/06/17 1243 09/08/17  1300     Response to Exercise   Blood Pressure (Admit)  152/62  148/56  150/50  166/80  166/50   Blood Pressure (Exercise)  166/62  190/66  190/80  160/60  142/60   Blood Pressure (Exit)  118/60  170/70  120/50  112/60  140/60   Heart Rate (Admit)  97 bpm  94 bpm  87 bpm  94 bpm  90 bpm   Heart Rate (Exercise)  114 bpm  85 bpm  101 bpm  109 bpm  101 bpm   Heart Rate (Exit)  90 bpm  90 bpm  94 bpm  89 bpm  81 bpm   Oxygen Saturation (Admit)  97 %  95 %  96 %  97 %  96 %   Oxygen Saturation (Exercise)  95 %  96 %  96 %  94 %  96 %   Oxygen Saturation (Exit)  95 %  96 %  97 %  97 %  96 %   Rating of Perceived Exertion (Exercise)  _0 Perceived Dyspnea (Exercise)  _1 Duration  Progress to 45 minutes of aerobic exercise without signs/symptoms of physical distress  Progress to 45 minutes of aerobic exercise without signs/symptoms of physical distress  Progress to 45 minutes of aerobic exercise without signs/symptoms of physical distress  Progress to 45 minutes of aerobic exercise without signs/symptoms of physical distress  Progress to 45 minutes of aerobic exercise without signs/symptoms of physical distress   Intensity  Other (comment) 40-80% of HRR  Other (comment) 40-80% of HRR  THRR unchanged  THRR unchanged  THRR unchanged     Progression   Progression  Continue to progress workloads to maintain intensity without signs/symptoms of physical distress.  Continue to progress workloads to maintain intensity without signs/symptoms of physical distress.  Continue to progress workloads to maintain intensity without signs/symptoms of physical distress.  Continue to progress workloads to maintain intensity without signs/symptoms of physical distress.  Continue to progress workloads to maintain intensity without signs/symptoms of physical distress.     Resistance Training   Training Prescription  Yes  Yes  Yes  Yes  Yes   Weight  orange bands  orange bands  orange bands   orange bands  orange bands   Reps  10-15  10-15  10-15  10-15  10-15   Time  10 Minutes  10 Minutes  10 Minutes  10 Minutes  10 Minutes     Bike   Level  0.5  -  0.5  0.5  0.5   Minutes  17  -  _0 NuStep   Level  3  3  -  3  2   Minutes  17  17  -  17  17   METs  2  1.9  -  1.8  2     Track   Laps  _1 -   Minutes  _2 -   Row Name 09/13/17 1200 09/29/17 1300 10/06/17 1200 10/18/17 1400 10/20/17 1200     Response to Exercise   Blood Pressure (Admit)  170/60  148/60  144/64  136/72  150/66   Blood Pressure (Exercise)  160/60  184/74 recheck during exercise 164/60  170/54  160/68  120/60   Blood Pressure (Exit)  124/60  130/60  124/70  124/54  130/76   Heart Rate (Admit)  98 bpm  89 bpm  89 bpm  98 bpm  85 bpm   Heart Rate (Exercise)  108 bpm  106 bpm  108 bpm  113 bpm  115 bpm   Heart Rate (Exit)  87 bpm  87 bpm  87 bpm  96 bpm  93 bpm   Oxygen Saturation (Admit)  96 %  96 %  98 %  95 %  96 %   Oxygen Saturation (Exercise)  97 %  98 %  96 %  95 %  96 %   Oxygen Saturation (Exit)  96 %  95 %  97 %  93 %  95 %   Rating of Perceived Exertion (Exercise)  _3 Perceived Dyspnea (Exercise)  _4 Duration  Progress to 45 minutes of aerobic exercise without signs/symptoms of physical distress  Progress to 45 minutes of aerobic exercise without signs/symptoms of physical distress  Progress to 45 minutes of aerobic exercise without signs/symptoms of physical distress  Progress to 45 minutes of aerobic exercise without signs/symptoms of physical distress  Progress to 45 minutes of aerobic exercise without signs/symptoms of physical distress   Intensity  THRR unchanged  THRR unchanged  THRR unchanged  THRR unchanged  THRR unchanged     Progression   Progression  Continue to progress workloads to maintain intensity without signs/symptoms of physical distress.  Continue to progress workloads to maintain intensity without signs/symptoms  of physical distress.  Continue to progress workloads to maintain intensity without signs/symptoms of physical distress.  Continue to progress workloads to maintain intensity without signs/symptoms of physical distress.  Continue to progress workloads to maintain intensity without signs/symptoms of physical distress.     Resistance Training   Training Prescription  Yes  Yes  Yes  Yes  Yes   Weight  orange bands  orange bands  orange bands  orange bands  orange bands   Reps  10-15  10-15  10-15  10-15  10-15   Time  10 Minutes  10 Minutes  10 Minutes  10 Minutes  10 Minutes     Bike   Level  0.8  0.8  0.8  0.8  -   Minutes  _5 -     NuStep  Level  4  -  _0 Minutes  17  -  _1 METs  2.1  -  2.1  1.8  2.6     Track   Laps  _2 Minutes  _3 Row Name 10/25/17 1500             Response to Exercise   Blood Pressure (Admit)  146/64       Blood Pressure (Exercise)  160/68       Blood Pressure (Exit)  158/64       Heart Rate (Admit)  99 bpm       Heart Rate (Exercise)  101 bpm       Heart Rate (Exit)  84 bpm       Oxygen Saturation (Admit)  96 %       Oxygen Saturation (Exercise)  96 %       Oxygen Saturation (Exit)  97 %       Rating of Perceived Exertion (Exercise)  11       Perceived Dyspnea (Exercise)  1       Duration  Progress to 45 minutes of aerobic exercise without signs/symptoms of physical distress       Intensity  THRR unchanged         Progression   Progression  Continue to progress workloads to maintain intensity without signs/symptoms of physical distress.         Resistance Training   Training Prescription  Yes       Weight  orange bands       Reps  10-15       Time  10 Minutes         Bike   Level  0.8       Minutes  17         NuStep   Level  4       Minutes  17       METs  2.1         Track   Laps  15       Minutes  17         Home Exercise Plan   Plans to continue exercise at  Home  (comment)       Frequency  Add 3 additional days to program exercise sessions.          Exercise Comments: Exercise Comments    Row Name 10/25/17 1540           Exercise Comments  Home exercise completed          Exercise Goals and Review:   Exercise Goals Re-Evaluation : Exercise Goals Re-Evaluation    Row Name 08/25/17 1658 08/26/17 0709 09/13/17 0744 09/29/17 0752 10/31/17 0728     Exercise Goal Re-Evaluation   Exercise Goals Review  Increase Physical Activity;Increase Strength and Stamina;Able to understand and use rate of perceived exertion (RPE) scale;Able to understand and use Dyspnea scale;Knowledge and understanding of Target Heart Rate Range (THRR);Understanding of Exercise Prescription  Increase Strength and Stamina;Increase Physical Activity;Able to understand and use Dyspnea scale;Able to understand and use rate of perceived exertion (RPE) scale;Knowledge and understanding of Target Heart Rate Range (THRR);Understanding of Exercise Prescription  Increase Strength and Stamina;Increase Physical Activity;Able to understand and use Dyspnea scale;Able to understand and use  rate of perceived exertion (RPE) scale;Knowledge and understanding of Target Heart Rate Range (THRR);Understanding of Exercise Prescription  Increase Strength and Stamina;Increase Physical Activity;Able to understand and use Dyspnea scale;Able to understand and use rate of perceived exertion (RPE) scale;Knowledge and understanding of Target Heart Rate Range (THRR);Understanding of Exercise Prescription  Increase Physical Activity;Able to understand and use rate of perceived exertion (RPE) scale;Knowledge and understanding of Target Heart Rate Range (THRR);Understanding of Exercise Prescription;Able to understand and use Dyspnea scale;Increase Strength and Stamina   Comments  -  Patient has only attended three exercise sessions. Will cont. to monitor and progress as able.   Patient has only attended 5 exercise  sessions. Patient is showing small improvements. Blood pressure medicine has been adjusted due to high BP's during arrival and exercise. Will cont. to monitor and progress as able.   Patient has only attended 6 exercise sessions. Patient is showing small improvements. Blood pressure medicine has been adjusted due to high BP's during arrival and exercise. Will cont. to monitor and progress as able.   Patient's attendance has not been consistent lately due to family conflicts. Home exercise completed. Will highly encourage page to stay active at home. Patient seems motivated to make changes. Is able to walk 15-19 laps (200 ft each) in 15 minutes. Will cont. to monitor and motivate as able.    Expected Outcomes  -  Through exercise at rehab and at home, patient will increase strength and stamina and be able to perform ADL's at home.   Through exercise at rehab and at home, patient will increase strength and stamina and understand how to safely exercise at home.   Through exercise at rehab and at home, patient will increase strength and stamina making ADL's easier to perform. Patient will also have a better understanding of safe exercise and what they are capable to do outside of clinical supervision.  Through exercise at rehab and at home, patient will increase strength and stamina making ADL's easier to perform. Patient will also have a better understanding of safe exercise and what they are capable to do outside of clinical supervision.      Discharge Exercise Prescription (Final Exercise Prescription Changes): Exercise Prescription Changes - 10/25/17 1500      Response to Exercise   Blood Pressure (Admit)  146/64    Blood Pressure (Exercise)  160/68    Blood Pressure (Exit)  158/64    Heart Rate (Admit)  99 bpm    Heart Rate (Exercise)  101 bpm    Heart Rate (Exit)  84 bpm    Oxygen Saturation (Admit)  96 %    Oxygen Saturation (Exercise)  96 %    Oxygen Saturation (Exit)  97 %    Rating of  Perceived Exertion (Exercise)  11    Perceived Dyspnea (Exercise)  1    Duration  Progress to 45 minutes of aerobic exercise without signs/symptoms of physical distress    Intensity  THRR unchanged      Progression   Progression  Continue to progress workloads to maintain intensity without signs/symptoms of physical distress.      Resistance Training   Training Prescription  Yes    Weight  orange bands    Reps  10-15    Time  10 Minutes      Bike   Level  0.8    Minutes  17      NuStep   Level  4    Minutes  17  METs  2.1      Track   Laps  15    Minutes  [place] to continue exercise at  Home (comment)    Frequency  Add 3 additional days to program exercise sessions.       Nutrition:  Target Goals: Understanding of nutrition guidelines, daily intake of sodium <1548m, cholesterol <2060m calories 30% from fat and 7% or less from saturated fats, daily to have 5 or more servings of fruits and vegetables.  Biometrics: Pre Biometrics - 08/05/17 1041      Pre Biometrics   Grip Strength  18 kg        Nutrition Therapy Plan and Nutrition Goals: Nutrition Therapy & Goals - 09/08/17 1336      Nutrition Therapy   Diet  Carb Modified, Heart Healthy      Personal Nutrition Goals   Nutrition Goal  Identify food quantities necessary to achieve wt loss of  -2# per week to a goal wt loss of 6-24 lb at graduation from pulmonary rehab.      Intervention Plan   Intervention  Prescribe, educate and counsel regarding individualized specific dietary modifications aiming towards targeted core components such as weight, hypertension, lipid management, diabetes, heart failure and other comorbidities.    Expected Outcomes  Short Term Goal: Understand basic principles of dietary content, such as calories, fat, sodium, cholesterol and nutrients.;Long Term Goal: Adherence to prescribed nutrition plan.       Nutrition Assessments: Nutrition Assessments -  09/08/17 1412      Rate Your Plate Scores   Pre Score  50       Nutrition Goals Re-Evaluation:   Nutrition Goals Discharge (Final Nutrition Goals Re-Evaluation):   Psychosocial: Target Goals: Acknowledge presence or absence of significant depression and/or stress, maximize coping skills, provide positive support system. Participant is able to verbalize types and ability to use techniques and skills needed for reducing stress and depression.  Initial Review & Psychosocial Screening: Initial Psych Review & Screening - 08/05/17 1057      Initial Review   Comments  patient is raising her 1657ear old grandson as her son and her two grandsons       Quality of Life Scores:  Scores of 19 and below usually indicate a poorer quality of life in these areas.  A difference of  2-3 points is a clinically meaningful difference.  A difference of 2-3 points in the total score of the Quality of Life Index has been associated with significant improvement in overall quality of life, self-image, physical symptoms, and general health in studies assessing change in quality of life.   PHQ-9: Recent Review Flowsheet Data    [facility] 08/[facility]2017 04/26/2017   Decreased Interest 0 0   Down, Depressed, Hopeless 0 0   PHQ - 2 Score 0 0     Interpretation of Total Score  Total Score Depression Severity:  1-4 = Minimal depression, 5-9 = Mild depression, 10-14 = Moderate depression, 15-19 = Moderately severe depression, 20-27 = Severe depression   Psychosocial Evaluation and Intervention: Psychosocial Evaluation - 08/05/17 1056      Psychosocial Evaluation & Interventions   Interventions  Encouraged to exercise with the program and follow exercise prescription    Expected Outcomes  patient will remain free from psychosocial barriers to participation in pulmonary rehab    Continue Psychosocial Services   Follow up required by staff  Psychosocial Re-Evaluation: Psychosocial  Re-Evaluation    [place] Name 08/22/17 1350 09/13/17 1252 10/06/17 0654 10/31/17 1144       Psychosocial Re-Evaluation   Current issues with  Current Stress Concerns  Current Stress Concerns  Current Stress Concerns  Current Stress Concerns    Comments  patient is raising her 3 grandchildren, 1 as her own. she worries that her health will not allow her to see them marry.  patient is raising her 3 grandchildren, 1 as her own. she worries that her health will not allow her to see them marry.  patient is raising her 3 grandchildren, 1 as her own. she worries that her health will not allow her to see them marry.  patient is raising her 3 grandchildren, 1 as her own. she worries that her health will not allow her to see them marry.    Expected Outcomes  patient will remain free from psychosocial barriers to participation in pulmonary rehab  patient will remain free from psychosocial barriers to participation in pulmonary rehab  patient will remain free from psychosocial barriers to participation in pulmonary rehab  patient will remain free from psychosocial barriers to participation in pulmonary rehab    Interventions  Encouraged to attend Pulmonary Rehabilitation for the exercise  Encouraged to attend Pulmonary Rehabilitation for the exercise  Encouraged to attend Pulmonary Rehabilitation for the exercise  Encouraged to attend Pulmonary Rehabilitation for the exercise    Continue Psychosocial Services   Follow up required by staff  Follow up required by staff  Follow up required by staff  -    Comments  -  patient is raising her 63 year old grandson as her son and her two grandsons  -  -      Initial Review   Source of Stress Concerns  -  Family  Family  Family       Psychosocial Discharge (Final Psychosocial Re-Evaluation): Psychosocial Re-Evaluation - 10/31/17 1144      Psychosocial Re-Evaluation   Current issues with  Current Stress Concerns    Comments  patient is raising her 3 grandchildren, 1 as  her own. she worries that her health will not allow her to see them marry.    Expected Outcomes  patient will remain free from psychosocial barriers to participation in pulmonary rehab    Interventions  Encouraged to attend Pulmonary Rehabilitation for the exercise      Initial Review   Source of Stress Concerns  Family       Education: Education Goals: Education classes will be provided on a weekly basis, covering required topics. Participant will state understanding/return demonstration of topics presented.  Learning Barriers/Preferences: Learning Barriers/Preferences - 08/05/17 1054      Learning Barriers/Preferences   Learning Barriers  None    Learning Preferences  Verbal Instruction       Education Topics: Risk Factor Reduction:  -Group instruction that is supported by a PowerPoint presentation. Instructor discusses the definition of a risk factor, different risk factors for pulmonary disease, and how the heart and lungs work together.     Nutrition for Pulmonary Patient:  -Group instruction provided by PowerPoint slides, verbal discussion, and written materials to support subject matter. The instructor gives an explanation and review of healthy diet recommendations, which includes a discussion on weight management, recommendations for fruit and vegetable consumption, as well as protein, fluid, caffeine, fiber, sodium, sugar, and alcohol. Tips for eating when patients are short of breath are discussed.   Pursed Lip  Breathing:  -Group instruction that is supported by demonstration and informational handouts. Instructor discusses the benefits of pursed lip and diaphragmatic breathing and detailed demonstration on how to preform both.     Oxygen Safety:  -Group instruction provided by PowerPoint, verbal discussion, and written material to support subject matter. There is an overview of "What is Oxygen" and "Why do we need it".  Instructor also reviews how to create a safe  environment for oxygen use, the importance of using oxygen as prescribed, and the risks of noncompliance. There is a brief discussion on traveling with oxygen and resources the patient may utilize.   Oxygen Equipment:  -Group instruction provided by [facility] Staff utilizing handouts, written materials, and equipment demonstrations.   PULMONARY REHAB OTHER RESPIRATORY from 10/20/2017 in [place]  Date  09/08/17  Educator  Ace Gins  Instruction Review Code  2- meets goals/outcomes      Signs and Symptoms:  -Group instruction provided by written material and verbal discussion to support subject matter. Warning signs and symptoms of infection, stroke, and heart attack are reviewed and when to call the physician/911 reinforced. Tips for preventing the spread of infection discussed.   Advanced Directives:  -Group instruction provided by verbal instruction and written material to support subject matter. Instructor reviews Advanced Directive laws and proper instruction for filling out document.   Pulmonary Video:  -Group video education that reviews the importance of medication and oxygen compliance, exercise, good nutrition, pulmonary hygiene, and pursed lip and diaphragmatic breathing for the pulmonary patient.   Exercise for the Pulmonary Patient:  -Group instruction that is supported by a PowerPoint presentation. Instructor discusses benefits of exercise, core components of exercise, frequency, duration, and intensity of an exercise routine, importance of utilizing pulse oximetry during exercise, safety while exercising, and options of places to exercise outside of rehab.     PULMONARY REHAB OTHER RESPIRATORY from 10/20/2017 in [place]  Date  08/25/17  Educator  Cloyde Reams  Instruction Review Code  2- meets goals/outcomes      Pulmonary Medications:  -Verbally interactive group education provided by instructor with focus on  inhaled medications and proper administration.   Anatomy and Physiology of the Respiratory System and Intimacy:  -Group instruction provided by PowerPoint, verbal discussion, and written material to support subject matter. Instructor reviews respiratory cycle and anatomical components of the respiratory system and their functions. Instructor also reviews differences in obstructive and restrictive respiratory diseases with examples of each. Intimacy, Sex, and Sexuality differences are reviewed with a discussion on how relationships can change when diagnosed with pulmonary disease. Common sexual concerns are reviewed.   PULMONARY REHAB OTHER RESPIRATORY from 10/20/2017 in [place]  Date  09/29/17  Educator  RN  Instruction Review Code  2- meets goals/outcomes      MD DAY -A group question and answer session with a medical doctor that allows participants to ask questions that relate to their pulmonary disease state.   PULMONARY REHAB OTHER RESPIRATORY from 10/20/2017 in [place]  Date  10/20/17  Educator  yacoub  Instruction Review Code  2- meets goals/outcomes      OTHER EDUCATION -Group or individual verbal, written, or video instructions that support the educational goals of the pulmonary rehab program.   PULMONARY REHAB OTHER RESPIRATORY from 10/20/2017 in [place]  Date  08/18/17  Educator  RD  Instruction Review Code  1- Verbalizes Understanding      Knowledge Questionnaire Score:   Core Components/Risk Factors/Patient Goals at Admission: Personal Goals and Risk Factors at Admission - 08/05/17 1055      Core Components/Risk Factors/Patient Goals on Admission    Weight Management  Obesity;Yes    Intervention  Weight Management: Develop a combined nutrition and exercise program designed to reach desired caloric intake, while maintaining appropriate intake of nutrient and fiber,  sodium and fats, and appropriate energy expenditure required for the weight goal.;Obesity: Provide education and appropriate resources to help participant work on and attain dietary goals.;Weight Management/Obesity: Establish reasonable short term and long term weight goals.;Weight Management: Provide education and appropriate resources to help participant work on and attain dietary goals.    Expected Outcomes  Understanding of distribution of calorie intake throughout the day with the consumption of 4-5 meals/snacks;Understanding recommendations for meals to include 15-35% energy as protein, 25-35% energy from fat, 35-60% energy from carbohydrates, less than 255m of dietary cholesterol, 20-35 gm of total fiber daily;Weight Loss: Understanding of general recommendations for a balanced deficit meal plan, which promotes 1-2 lb weight loss per week and includes a negative energy balance of [phone] kcal/d;Short Term: Continue to assess and modify interventions until short term weight is achieved    Improve shortness of breath with ADL's  Yes    Intervention  Provide education, individualized exercise plan and daily activity instruction to help decrease symptoms of SOB with activities of daily living.    Expected Outcomes  Short Term: Achieves a reduction of symptoms when performing activities of daily living.    Develop more efficient breathing techniques such as purse lipped breathing and diaphragmatic breathing; and practicing self-pacing with activity  Yes    Intervention  Provide education, demonstration and support about specific breathing techniuqes utilized for more efficient breathing. Include techniques such as pursed lipped breathing, diaphragmatic breathing and self-pacing activity.    Expected Outcomes  Short Term: Participant will be able to demonstrate and use breathing techniques as needed throughout daily activities.       Core Components/Risk Factors/Patient Goals Review:  Goals and Risk  Factor Review    [place] Name 08/22/17 1349 09/13/17 1249 10/06/17 0106201/21/19 1137       Core Components/Risk Factors/Patient Goals Review   Personal Goals Review  Weight Management/Obesity;Improve shortness of breath with ADL's;Develop more efficient breathing techniques such as purse lipped breathing and diaphragmatic breathing and practicing self-pacing with activity.  Weight Management/Obesity;Improve shortness of breath with ADL's;Develop more efficient breathing techniques such as purse lipped breathing and diaphragmatic breathing and practicing self-pacing with activity.  Weight Management/Obesity;Improve shortness of breath with ADL's;Develop more efficient breathing techniques such as purse lipped breathing and diaphragmatic breathing and practicing self-pacing with activity.  Weight Management/Obesity;Improve shortness of breath with ADL's;Develop more efficient breathing techniques such as purse lipped breathing and diaphragmatic breathing and practicing self-pacing with activity.;Hypertension    Review  patient has only attended a few sessions since admission and it is too early to evaluate progress towards pulmonary rehab goals  Patient has been limited to workload increases related to high BP. She is not seeing improvement in her shortness of breath at this time. She has seen her cardiologist and has routein appointments for medication adjustments. BP better today. Once BP stable patient will be encouraged to work harder and workloads will be increases at a pace that patient can physicall tolerate. She is observed utilizing PLB during exertion. Her weight remains stable at 89.2 which  is a 2.2 lb loss since admission.  Patient is doing well in pulmonary rehab. She has followed up with her Cardiologist for high BP and has had several appointment for titration of her medication. Her BP appears to be controlled at this point and we are able to increase her workloads as tolerated. Expected to see  greater progression over the next 30 days.  Patient continues to do well in pulmonary rehab. Her BP still remains on the higher side with moderate exertion. She has another follow-up with her cardiologist in a couple of weeks to continue to address her hypertension. Her resting BPs range from mid 130s-150s. She has not begun to loose weight however she has not changed her diet. She is 54 raising a 37 year old son and admits it is stressful. 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 She feels this is where some of the HTN originates from. She feels her SOB is improveing with activity.    Expected Outcomes  see admission expected outcomes  see admission expected outcomes  see admission expected outcomes  see admission expected outcomes       Core Components/Risk Factors/Patient Goals at Discharge (Final Review):  Goals and Risk Factor Review - 10/31/17 1137      Core Components/Risk Factors/Patient Goals Review   Personal Goals Review  Weight Management/Obesity;Improve shortness of breath with ADL's;Develop more efficient breathing techniques such as purse lipped breathing and diaphragmatic breathing and practicing self-pacing with activity.;Hypertension    Review  Patient continues to do well in pulmonary rehab. Her BP still remains on the higher side with moderate exertion. She has another follow-up with her cardiologist in a couple of weeks to continue to address her hypertension. Her resting BPs range from mid 130s-150s. She has not begun to loose weight however she has not changed her diet. She is 70 raising a 17 year old son and admits it is stressful. 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 She feels this is where some of the HTN originates from. She feels her SOB is improveing with activity.    Expected Outcomes  see admission expected outcomes       ITP  Comments:   Comments: Patient has attended 47 pulmonary rehab sessions since admission.

## 2017-11-03 ENCOUNTER — Encounter (HOSPITAL_COMMUNITY)
Admission: RE | Admit: 2017-11-03 | Discharge: 2017-11-03 | Disposition: A | Discharge status: Home / Self Care | Payer: Medicare Other | Source: Ambulatory Visit | Attending: Cardiology | Admitting: Cardiology

## 2017-11-03 VITALS — Wt 199.1 lb

## 2017-11-03 DIAGNOSIS — R0602 Shortness of breath: Secondary | ICD-10-CM | POA: Diagnosis not present

## 2017-11-03 DIAGNOSIS — R5381 Other malaise: Secondary | ICD-10-CM | POA: Diagnosis not present

## 2017-11-03 NOTE — Progress Notes (Signed)
Daily Session Note  Patient Details  Name: Alice Cole MRN: [id number] Date of Birth: December 10, 1945 Referring Provider:     Pulmonary Rehab Walk Test from 08/09/2017 in [place]  Referring Provider  Dr. Chase Caller      Encounter Date: 11/03/2017  Check In: Session Check In - 11/03/17 1049      Check-In   Location  [facility]    Staff Present  Trish Fountain, RN, BSN;Molly diVincenzo, MS, ACSM RCEP, Exercise Physiologist;Eisa Conaway Leonia Reeves, RN, BSN    Supervising physician immediately available to respond to emergencies  [facility] immediately available    Physician(s)  Dr. Bonner Puna    Medication changes reported      No    Fall or balance concerns reported     No    Tobacco Cessation  No Change    Warm-up and Cool-down  Performed as group-led instruction    Resistance Training Performed  Yes    VAD Patient?  No      Pain Assessment   Currently in Pain?  No/denies    Multiple Pain Sites  No       Capillary Blood Glucose: No results found for this or any previous visit (from the past 24 hour(s)).  Exercise Prescription Changes - 11/03/17 1200      Response to Exercise   Blood Pressure (Admit)  140/66    Blood Pressure (Exercise)  170/90    Blood Pressure (Exit)  136/64    Heart Rate (Admit)  101 bpm    Heart Rate (Exercise)  122 bpm    Heart Rate (Exit)  101 bpm    Oxygen Saturation (Admit)  95 %    Oxygen Saturation (Exercise)  95 %    Oxygen Saturation (Exit)  94 %    Rating of Perceived Exertion (Exercise)  13    Perceived Dyspnea (Exercise)  2    Duration  Progress to 45 minutes of aerobic exercise without signs/symptoms of physical distress    Intensity  THRR unchanged      Progression   Progression  Continue to progress workloads to maintain intensity without signs/symptoms of physical distress.      Resistance Training   Training Prescription  Yes    Weight  orange bands    Reps  10-15    Time  10 Minutes       Bike   Level  0.8    Minutes  17      Track   Laps  17    Minutes  17       Social History   Tobacco Use  Smoking Status Never Smoker  Smokeless Tobacco Never Used    Goals Met:  Exercise tolerated well Strength training completed today  Goals Unmet:  Not Applicable  Comments: Service time is from 1030 to [place]    Dr. Rush Farmer is Medical Director for Pulmonary Rehab at [facility].

## 2017-11-08 ENCOUNTER — Encounter (HOSPITAL_COMMUNITY)
Admission: RE | Admit: 2017-11-08 | Discharge: 2017-11-08 | Disposition: A | Discharge status: Home / Self Care | Payer: Medicare Other | Source: Ambulatory Visit | Attending: Cardiology | Admitting: Cardiology

## 2017-11-08 VITALS — Wt 200.2 lb

## 2017-11-08 DIAGNOSIS — R0602 Shortness of breath: Secondary | ICD-10-CM

## 2017-11-08 DIAGNOSIS — R5381 Other malaise: Secondary | ICD-10-CM | POA: Diagnosis not present

## 2017-11-08 NOTE — Progress Notes (Signed)
Cardiology Office Note:    Date:  11/09/2017   ID:  Alice Cole, DOB 1946-01-27, MRN [id number]  PCP:  Maurice Small, MD  Cardiologist:  No primary care provider on file.    Referring MD: Maurice Small, MD   Chief Complaint  Patient presents with  . Sleep Apnea  . Hypertension    History of Present Illness:    Alice Cole is a 72 y.o. female with a hx of OSA/PVCs and HTN.  She has a history of OSA but does not use CPAP on a regular basis. She has a history of PVCs and normal coronary arteries by cath in 2003. Nuclear stress test showed no ischemia in 2017 and 2D echo showed normal LVF with diastolic dysfunction.  The last time I saw her she was having dyspnea on exertion felt secondary to obesity, poorly controlled hypertension, deconditioning and DD. Cardizem was increased to 360 mg daily but her insurance wouldn't pay for. She was seen in the hypertension clinic the following week blood pressure was much better controlled. PFTs were abnormal with mild to moderate restriction in she was referred to pulmonary. High-resolution CT 02/2017 showed no evidence of interstitial lung disease and tiny 2-3 mm pulmonary nodules on the right. She was referred to pulmonary rehabilitation.  She saw Estella Husk, PA in November and her BP was elevated and she changed pharmacies and found out the higher dose of CCB was covered and so her Cardizem was increased to 360mg  daily.  She was seen back in HTN clinic in December and BP was controlled.   She is here today for followup and is doing well.  She denies any chest pain or pressure, PND, orthopnea, LE edema, dizziness, palpitations or syncope. She says that her chronic SOB has improved since going to Pulmonary rehab.  She is compliant with her meds and is tolerating meds with no SE.  She has not been using her CPAP because it has a very bad odor to it and she is afraid that she will get an infection.  Her device is over 72 years old and she wants a new device.      Past Medical History:  Diagnosis Date  . Abnormal EKG 07/09/2016  . Abnormal liver function   . Adenomatous colon polyp   . Bigeminy   . Colonic polyp   . Dermatitis   . Diabetes mellitus   . Diverticulitis    recurrent  . DVT (deep venous thrombosis) ([place])   . Esophageal stricture   . GERD (gastroesophageal reflux disease)   . Hemorrhoids   . Hyperlipidemia   . Hypertension   . OSA (obstructive sleep apnea) 07/09/2016   She has not been using her CPAP device because she needs new supplies  . PVC's (premature ventricular contractions)     Past Surgical History:  Procedure Laterality Date  . ABDOMINAL HYSTERECTOMY    . CHOLECYSTECTOMY     aprox 10 years ago  . EYE SURGERY     right and left eye  . KNEE SURGERY     Right  . KNEE SURGERY     Left    Current Medications: Current Meds  Medication Sig  . aspirin EC 81 MG tablet Take 1 tablet (81 mg total) by mouth daily.  Marland Kitchen dicyclomine (BENTYL) 20 MG tablet 1 TABLET FOUR TIMES A DAY BEFORE MEALS ORALLY 30 DAY(S)  . diltiazem (CARDIZEM CD) 360 MG 24 hr capsule Take 1 capsule (360 mg total) by mouth  daily.  . hydrochlorothiazide (HYDRODIURIL) 25 MG tablet Take 25 mg by mouth daily.  . Insulin Aspart (NOVOLOG [place]) Inject 42 Units into the skin 3 (three) times daily. 42 units morning, 42 units lunch, 54 units supper  . metFORMIN (GLUCOPHAGE-XR) 500 MG 24 hr tablet TAKE 4 TABLETS EVERY DAY WITH EVENING MEAL  . NOVOLOG FLEXPEN 100 UNIT/ML FlexPen   . omeprazole (PRILOSEC) 20 MG capsule Take 20 mg by mouth daily.  . pravastatin (PRAVACHOL) 40 MG tablet Take 40 mg by mouth daily.  . ramipril (ALTACE) 10 MG capsule Take 10 mg by mouth 2 (two) times daily.     Allergies:   Metoprolol; Nortriptyline; Olmesartan; Tradjenta [linagliptin]; Alatrofloxacin; Benicar [olmesartan medoxomil]; Beta adrenergic blockers; Cartia xt [diltiazem]; Cephalexin; Codeine; Colesevelam; Crestor [rosuvastatin calcium]; Flagyl [metronidazole]; Fluvastatin  sodium; Levemir [insulin detemir]; Lisinopril; Metoprolol succinate; Nortriptyline hcl; Pamelor [nortriptyline hcl]; Rosuvastatin; Sulfamethoxazole-trimethoprim; Verapamil; Welchol [colesevelam hcl]; Adhesive [tape]; Sitagliptin; and Trovan [trovafloxacin]   Social History   Socioeconomic History  . Marital status: Married    Spouse name: None  . Number of children: 2  . Years of education: 75  . Highest education level: None  Social Needs  . Financial resource strain: None  . Food insecurity - worry: None  . Food insecurity - inability: None  . Transportation needs - medical: None  . Transportation needs - non-medical: None  Occupational History  . Occupation: Retired  Tobacco Use  . Smoking status: Never Smoker  . Smokeless tobacco: Never Used  Substance and Sexual Activity  . Alcohol use: No    Comment: used to drink 30 yrs ago  . Drug use: No  . Sexual activity: None  Other Topics Concern  . None  Social History Narrative   Regular exercise-yes   Caffeine use-yes   Lives with Husband and 3 graqdnsons and one is adopted   Husband drinks a lot-her husband drinks and smokes a lot   Used to be be married and is currently divorced   Daughters are on drugs     Family History: The patient's family history includes Cancer in her daughter; Diabetes in her other; Heart attack (age of onset: 83) in her mother; Heart attack (age of onset: 32) in her maternal grandfather; Heart attack (age of onset: 53) in her maternal grandmother; Heart attack (age of onset: 38) in her brother; Heart disease in her other.  ROS:   Please see the history of present illness.    ROS  All other systems reviewed and negative.   EKGs/Labs/Other Studies Reviewed:    The following studies were reviewed today: none  EKG:  EKG is not ordered today.    Recent Labs: 12/21/2016: BUN 19; Creatinine, Ser 0.95; NT-Pro BNP 104; Potassium 4.1; Sodium 138; TSH 2.070   Recent Lipid Panel    Component Value  Date/Time   CHOL  08/25/2010 0404    194        ATP III CLASSIFICATION:  <200     mg/dL   Desirable  200-239  mg/dL   Borderline High  >=240    mg/dL   High          TRIG 175 (H) 08/25/2010 0404   HDL 38 (L) 08/25/2010 0404   CHOLHDL 5.1 08/25/2010 0404   VLDL 35 08/25/2010 0404   LDLCALC (H) 08/25/2010 0404    121        Total Cholesterol/HDL:CHD Risk Coronary Heart Disease Risk Table  Men   Women  1/2 Average Risk   3.4   3.3  Average Risk       5.0   4.4  2 X Average Risk   9.6   7.1  3 X Average Risk  23.4   11.0        Use the calculated Patient Ratio above and the CHD Risk Table to determine the patient's CHD Risk.        ATP III CLASSIFICATION (LDL):  <100     mg/dL   Optimal  100-129  mg/dL   Near or Above                    Optimal  130-159  mg/dL   Borderline  160-189  mg/dL   High  >190     mg/dL   Very High    Physical Exam:    VS:  BP (!) 170/80   Pulse 88   Ht 5\' 7"  (1.702 m)   Wt 199 lb 12.8 oz (90.6 kg)   SpO2 98%   BMI 31.29 kg/m     Wt Readings from Last 3 Encounters:  11/09/17 199 lb 12.8 oz (90.6 kg)  11/08/17 200 lb 2.8 oz (90.8 kg)  11/03/17 199 lb 1.2 oz (90.3 kg)     GEN:  Well nourished, well developed in no acute distress HEENT: Normal NECK: No JVD; No carotid bruits LYMPHATICS: No lymphadenopathy CARDIAC: RRR, no murmurs, rubs, gallops RESPIRATORY:  Clear to auscultation without rales, wheezing or rhonchi  ABDOMEN: Soft, non-tender, non-distended MUSCULOSKELETAL:  No edema; No deformity  SKIN: Warm and dry NEUROLOGIC:  Alert and oriented x 3 PSYCHIATRIC:  Normal affect   ASSESSMENT:    1. Premature ventricular contraction   2. Essential hypertension, benign   3. OSA (obstructive sleep apnea)    PLAN:    In order of problems listed above:  1.  PVCs - occasionally she will feel her heart skip but for the most part is suppressed on Cardizem  2.  HTN - BP is poorly controlled on exam today.  She will  continue on ramipril 10mg  daily, HCTZ 25mg  daily and Cardizem CD 360mg  daily.  She says that home and in pulmonary rehab it is normal.  I have asked her to check her BP daily for a week and call with the results.   3.  OSA - she has not been using her CPAP because there is a very bad smell to it and she would like a new device.  Her CPAP is over 24 years old. She has not used CPAP in over a year so I will set her up for a split night study first.     Medication Adjustments/Labs and Tests Ordered: Current medicines are reviewed at length with the patient today.  Concerns regarding medicines are outlined above.  No orders of the defined types were placed in this encounter.  No orders of the defined types were placed in this encounter.   Signed, Fransico Him, MD  11/09/2017 8:58 AM    [place]

## 2017-11-08 NOTE — Progress Notes (Signed)
Daily Session Note  Patient Details  Name: Alice Cole MRN: [id number] Date of Birth: 12/30/1945 Referring Provider:     Pulmonary Rehab Walk Test from 08/09/2017 in [facility]  Referring Provider  Dr. Ramaswamy      Encounter Date: 11/08/2017  Check In: Session Check In - 11/08/17 1217      Check-In   Location  [facility]    Staff Present  Portia Payne, RN, BSN;Molly diVincenzo, MS, ACSM RCEP, Exercise Physiologist;Joan Behrens, RN, BSN;Lisa Hughes, RN    Supervising physician immediately available to respond to emergencies  [facility] immediately available    Physician(s)  Dr. Grunz    Medication changes reported      No    Fall or balance concerns reported     No    Tobacco Cessation  No Change    Warm-up and Cool-down  Performed as group-led instruction    Resistance Training Performed  Yes    VAD Patient?  No      Pain Assessment   Currently in Pain?  No/denies    Multiple Pain Sites  No       Capillary Blood Glucose: No results found for this or any previous visit (from the past 24 hour(s)). POCT Glucose - 11/08/17 1232      POCT Blood Glucose   Pre-Exercise  243 mg/dL    Post-Exercise  169 mg/dL      Exercise Prescription Changes - 11/08/17 1230      Response to Exercise   Blood Pressure (Admit)  134/60    Blood Pressure (Exercise)  150/60    Blood Pressure (Exit)  114/60    Heart Rate (Admit)  93 bpm    Heart Rate (Exercise)  99 bpm    Heart Rate (Exit)  88 bpm    Oxygen Saturation (Admit)  95 %    Oxygen Saturation (Exercise)  96 %    Oxygen Saturation (Exit)  95 %    Rating of Perceived Exertion (Exercise)  11    Perceived Dyspnea (Exercise)  2    Duration  Progress to 45 minutes of aerobic exercise without signs/symptoms of physical distress    Intensity  THRR unchanged      Progression   Progression  Continue to progress workloads to maintain intensity without signs/symptoms of physical  distress.      Resistance Training   Training Prescription  Yes    Weight  orange bands    Reps  10-15    Time  10 Minutes      Bike   Level  0.8    Minutes  17      NuStep   Level  4    Minutes  17    METs  2.1      Track   Laps  14    Minutes  17       Social History   Tobacco Use  Smoking Status Never Smoker  Smokeless Tobacco Never Used    Goals Met:  Improved SOB with ADL's Exercise tolerated well No report of cardiac concerns or symptoms Strength training completed today  Goals Unmet:  Not Applicable  Comments: Service time is from 1030 to 1200   Dr. Wesam G. Yacoub is Medical Director for Pulmonary Rehab at [facility]. 

## 2017-11-09 ENCOUNTER — Encounter: Payer: Self-pay | Admitting: Cardiology

## 2017-11-09 ENCOUNTER — Ambulatory Visit (INDEPENDENT_AMBULATORY_CARE_PROVIDER_SITE_OTHER): Payer: Medicare Other | Admitting: Cardiology

## 2017-11-09 VITALS — BP 170/80 | HR 88 | Ht 67.0 in | Wt 199.8 lb

## 2017-11-09 DIAGNOSIS — G4733 Obstructive sleep apnea (adult) (pediatric): Secondary | ICD-10-CM

## 2017-11-09 DIAGNOSIS — I10 Essential (primary) hypertension: Secondary | ICD-10-CM | POA: Diagnosis not present

## 2017-11-09 DIAGNOSIS — I493 Ventricular premature depolarization: Secondary | ICD-10-CM | POA: Diagnosis not present

## 2017-11-09 NOTE — Patient Instructions (Signed)
Medication Instructions:  Your physician recommends that you continue on your current medications as directed. Please refer to the Current Medication list given to you today.   Labwork: None   Testing/Procedures: Your physician has recommended that you have a sleep study. This test records several body functions during sleep, including: brain activity, eye movement, oxygen and carbon dioxide blood levels, heart rate and rhythm, breathing rate and rhythm, the flow of air through your mouth and nose, snoring, body muscle movements, and chest and belly movement.    Follow-Up: Your physician wants you to follow-up in: 12 months. You will receive a reminder letter in the mail two months in advance. If you don't receive a letter, please call our office to schedule the follow-up appointment.   Any Other Special Instructions Will Be Listed Below (If Applicable).  Please check you blood pressure daily for one week and keep record of readings.  Call us with these readings.    If you need a refill on your cardiac medications before your next appointment, please call your pharmacy.

## 2017-11-10 ENCOUNTER — Telehealth: Payer: Self-pay | Admitting: *Deleted

## 2017-11-10 ENCOUNTER — Encounter (HOSPITAL_COMMUNITY)
Admission: RE | Admit: 2017-11-10 | Discharge: 2017-11-10 | Disposition: A | Discharge status: Home / Self Care | Payer: Medicare Other | Source: Ambulatory Visit | Attending: Cardiology | Admitting: Cardiology

## 2017-11-10 DIAGNOSIS — R0602 Shortness of breath: Secondary | ICD-10-CM

## 2017-11-10 NOTE — Telephone Encounter (Signed)
-----   Message from Almyra Free sent at 11/10/2017  2:40 PM EST ----- Regarding: RE: pre cert Pt can be scheduled. Let me know when and I will enter precert info. Thanks. ----- Message ----- From: Freada Bergeron, CMA Sent: 11/09/2017   5:35 PM To: Almyra Free Subject: pre cert                                       Thanks ----- Message ----- From: Justice Britain Sent: 11/09/2017   9:12 AM To: Freada Bergeron, CMA, Thompson Grayer, RN, # Subject: sleep study

## 2017-11-10 NOTE — Progress Notes (Signed)
Incomplete Session Note  Patient Details  Name: Alice Cole MRN: [id number] Date of Birth: 1946-07-01 Referring Provider:     Pulmonary Rehab Walk Test from 08/09/2017 in [facility]  Referring Provider  Dr. Lajean Silvius did not complete her rehab session.  Her check in BP was 192/74. Her MD is aware of her high BP per office visit yesterday. She has been instructed to check and record her BP for 1 week and to report to MD next week. She was discharged home asymptomatic.

## 2017-11-11 ENCOUNTER — Telehealth: Payer: Self-pay

## 2017-11-11 ENCOUNTER — Other Ambulatory Visit: Payer: Self-pay | Admitting: *Deleted

## 2017-11-11 DIAGNOSIS — Z79899 Other long term (current) drug therapy: Secondary | ICD-10-CM

## 2017-11-11 MED ORDER — CHLORTHALIDONE 25 MG PO TABS: 25.0000 mg | ORAL_TABLET | Freq: Every day | ORAL | 3 refills | Status: DC

## 2017-11-11 NOTE — Telephone Encounter (Signed)
Left message for patient to return call.

## 2017-11-11 NOTE — Telephone Encounter (Signed)
-----   Message from Sueanne Margarita, MD sent at 11/11/2017 10:38 AM EST ----- Regarding: RE: HTN at pulmonary rehab Increase Ramipril to 15mg  daily and have her followup in HTN clinic in [redacted] week along with BMET  Fransico Him, MD ----- Message ----- From: Benedetto Goad, RN Sent: 11/10/2017  12:53 PM To: Sueanne Margarita, MD Subject: HTN at pulmonary rehab                         Hi Dr. Radford Pax, Juluis Rainier The above patient presented to her bi-weekly pulmonary rehab exercise session today as scheduled. She has continued to have high resting BPs at check-in since her admission to the program. Today it was 192/74. She was completely asymptomatic. She was not allowed to exercise. I would not have sent her home if she were not 180 in your office yesterday. I know you are adjusting her medications in attempt to control. She states her instructions as of yesterdays appointment was to check her bp daily and record. I reinforced symptoms of htn crisis.  Portia E. Rollene Rotunda, RN, BSN

## 2017-11-11 NOTE — Telephone Encounter (Signed)
PER PT IS CURRENTLY TAKING RAMIPRIL 10 MG BID DISCUSSED WITH DR TURNER. PER  DR TURNER  STOP HCTZ   START CHLORTHALIDONE 25 MG DAILY AND F/U IN HTN CLINIC  .APPT MADE FOR 11-24-16 AT 3:00 PM

## 2017-11-14 ENCOUNTER — Telehealth: Payer: Self-pay | Admitting: Cardiology

## 2017-11-14 NOTE — Telephone Encounter (Signed)
Pt c/o medication issue:  1. Name of Medication: chlorthalidone   2. How are you currently taking this medication (dosage and times per day)? 25 mg// 1 tablet   3. Are you having a reaction (difficulty breathing--STAT)? no  4. What is your medication issue? Medication instructions caution that medication can cause dehydration and sugar levels to increase. Patient states that she is a diabetic and has trouble with her sugar. Patient wanted to know if there is an alternative?

## 2017-11-14 NOTE — Telephone Encounter (Signed)
Returned call to patient.  Patient wanting advice on taking hygroton due to the side effets of hyperglycemia. Patient states she is diabetic and would like to know if its safe to take. I advised the patient to take medication to control her blood pressure but monitor blood sugars closely. Informed patient that she should call back if blood sugars increase. Patient in agreement with plan and thanked me for the call.

## 2017-11-15 ENCOUNTER — Encounter (HOSPITAL_COMMUNITY): Admission: RE | Admit: 2017-11-15 | Payer: Medicare Other | Source: Ambulatory Visit

## 2017-11-15 ENCOUNTER — Telehealth (HOSPITAL_COMMUNITY): Payer: Self-pay

## 2017-11-16 DIAGNOSIS — I10 Essential (primary) hypertension: Secondary | ICD-10-CM | POA: Diagnosis not present

## 2017-11-16 DIAGNOSIS — E1165 Type 2 diabetes mellitus with hyperglycemia: Secondary | ICD-10-CM | POA: Diagnosis not present

## 2017-11-16 DIAGNOSIS — Z794 Long term (current) use of insulin: Secondary | ICD-10-CM | POA: Diagnosis not present

## 2017-11-17 ENCOUNTER — Encounter (HOSPITAL_COMMUNITY): Admission: RE | Admit: 2017-11-17 | Payer: Medicare Other | Source: Ambulatory Visit

## 2017-11-21 ENCOUNTER — Telehealth: Payer: Self-pay | Admitting: Cardiology

## 2017-11-21 NOTE — Telephone Encounter (Signed)
New Message     Pt c/o medication issue:  1. Name of Medication:   chlorthalidone (HYGROTON) 25 MG tablet Take 1 tablet (25 mg total) by mouth daily.     2. How are you currently taking this medication (dosage and times per day)? 1x a day   3. Are you having a reaction (difficulty breathing--STAT)?  yes  4. What is your medication issue? Having severe stomach cramps and it was changing her blood sugar levels, she stopped this medication on Friday 11/18/17

## 2017-11-21 NOTE — Telephone Encounter (Signed)
Spoke with patient. Patient stated she stop taking chlorthalidone on Friday due to abdominal cramps and upset stomach. Patient states symptoms have resolved since she has stopped chlorthalidone. Patient stated she started taking HCTZ 25 mg once a day. Patient has an appt with the hypertension clinic on 11/24/17. Informed patient to record blood pressure readings daily and bring with her to her appointment. Patient in agreement with plan and thanked me for the call.

## 2017-11-22 ENCOUNTER — Encounter (HOSPITAL_COMMUNITY)
Admission: RE | Admit: 2017-11-22 | Discharge: 2017-11-22 | Disposition: A | Discharge status: Home / Self Care | Payer: Medicare Other | Source: Ambulatory Visit | Attending: Internal Medicine | Admitting: Internal Medicine

## 2017-11-22 DIAGNOSIS — R0602 Shortness of breath: Secondary | ICD-10-CM | POA: Insufficient documentation

## 2017-11-22 DIAGNOSIS — R5381 Other malaise: Secondary | ICD-10-CM | POA: Insufficient documentation

## 2017-11-22 NOTE — Progress Notes (Signed)
Pulmonary Individual Treatment Plan  Patient Details  Name: Alice Cole MRN: [id number] Date of Birth: Dec 15, 1945 Referring Provider:     Pulmonary Rehab Walk Test from 08/09/2017 in [place]  Referring Provider  Dr. Chase Caller      Initial Encounter Date:    Pulmonary Rehab Walk Test from 08/09/2017 in [place]  Date  08/09/17  Referring Provider  Dr. Chase Caller      Visit Diagnosis: Shortness of breath  Patient's Home Medications on Admission:   Current Outpatient Medications:  .  aspirin EC 81 MG tablet, Take 1 tablet (81 mg total) by mouth daily., Disp: , Rfl:  .  chlorthalidone (HYGROTON) 25 MG tablet, Take 1 tablet (25 mg total) by mouth daily., Disp: 90 tablet, Rfl: 3 .  dicyclomine (BENTYL) 20 MG tablet, 1 TABLET FOUR TIMES A DAY BEFORE MEALS ORALLY 30 DAY(S), Disp: , Rfl: 5 .  diltiazem (CARDIZEM CD) 360 MG 24 hr capsule, Take 1 capsule (360 mg total) by mouth daily., Disp: 90 capsule, Rfl: 3 .  Insulin Aspart (NOVOLOG [place]), Inject 42 Units into the skin 3 (three) times daily. 42 units morning, 42 units lunch, 54 units supper, Disp: , Rfl:  .  metFORMIN (GLUCOPHAGE-XR) 500 MG 24 hr tablet, TAKE 4 TABLETS EVERY DAY WITH EVENING MEAL, Disp: , Rfl: 5 .  NOVOLOG FLEXPEN 100 UNIT/ML FlexPen, , Disp: , Rfl:  .  omeprazole (PRILOSEC) 20 MG capsule, Take 20 mg by mouth daily., Disp: , Rfl:  .  pravastatin (PRAVACHOL) 40 MG tablet, Take 40 mg by mouth daily., Disp: , Rfl: 11 .  ramipril (ALTACE) 10 MG capsule, Take 10 mg by mouth 2 (two) times daily., Disp: , Rfl:   Past Medical History: Past Medical History:  Diagnosis Date  . Abnormal EKG 07/09/2016  . Abnormal liver function   . Adenomatous colon polyp   . Bigeminy   . Colonic polyp   . Dermatitis   . Diabetes mellitus   . Diverticulitis    recurrent  . DVT (deep venous thrombosis) ([place])   . Esophageal stricture   . GERD (gastroesophageal reflux disease)    . Hemorrhoids   . Hyperlipidemia   . Hypertension   . OSA (obstructive sleep apnea) 07/09/2016   She has not been using her CPAP device because she needs new supplies  . PVC's (premature ventricular contractions)     Tobacco Use: Social History   Tobacco Use  Smoking Status Never Smoker  Smokeless Tobacco Never Used    Labs: Recent Review Flowsheet Data    Labs for ITP Cardiac and Pulmonary Rehab Latest Ref Rng & Units 11/27/2009 12/06/2009 08/25/2010 03/21/2012 12/20/2012   Cholestrol 0 - 200 mg/dL 265 ATP III CLASSIFICATION: <200     mg/dL   Desirable 200-239  mg/dL   Borderline High >=240    mg/dL   High(H) - 194 ATP III CLASSIFICATION: <200     mg/dL   Desirable 200-239  mg/dL   Borderline High >=240    mg/dL   High - -   LDLCALC 0 - 99 mg/dL 181 Total Cholesterol/HDL:CHD Risk Coronary Heart Disease Risk Table Men   Women 1/2 Average Risk   3.4   3.3 Average Risk       5.0   4.4 2 X Average Risk   9.6   7.1 3 X Average Risk  23.4   11.0 Use the calculated Patient Ratio above and the CHD  Risk Table to determine the patient's CHD Risk. ATP III CLASSIFICATION (LDL): <100     mg/dL   Optimal 100-129  mg/dL   Near or Above Optimal 130-159  mg/dL   Borderline 160-189  mg/dL   High >190     mg/dL   Very High(H) - 121 Total Cholesterol/HDL:CHD Risk Coronary Heart Disease Risk Table Men   Women 1/2 Average Risk   3.4   3.3 Average Risk       5.0   4.4 2 X Average Risk   9.6   7.1 3 X Average Risk  23.4   11.0 Use the calculated Patient Ratio above and the CHD Risk Table to determine the patient's CHD Risk. ATP III CLASSIFICATION (LDL): <100     mg/dL   Optimal 100-129  mg/dL   Near or Above Optimal 130-159  mg/dL   Borderline 160-189  mg/dL   High >190     mg/dL   Very High(H) - -   HDL >39 mg/dL 35(L) - 38(L) - -   Trlycerides <150 mg/dL 244(H) - 175(H) - -   Hemoglobin A1c 4.6 - 6.5 % - - - - 8.6(H)   TCO2 0 - 100 mmol/L - 26 - 23 -      Capillary  Blood Glucose: Lab Results  Component Value Date   GLUCAP 192 (H) 02/20/2014   GLUCAP 208 (H) 12/14/2012   GLUCAP 185 (H) 12/13/2012   GLUCAP 187 (H) 12/13/2012   GLUCAP 205 (H) 07/26/2012   POCT Glucose    Row Name 08/16/17 1214 08/18/17 1333 08/25/17 1251 09/06/17 1248 09/08/17 1352     POCT Blood Glucose   Pre-Exercise  220 mg/dL  243 mg/dL  190 mg/dL  180 mg/dL  258 mg/dL   Post-Exercise  166 mg/dL  147 mg/dL  113 mg/dL  140 mg/dL  149 mg/dL   Row Name 09/13/17 1235 09/29/17 1321 10/06/17 1243 10/18/17 1431 10/20/17 1247     POCT Blood Glucose   Pre-Exercise  238 mg/dL  123 mg/dL [place] to drink before exercise  123 mg/dL  206 mg/dL  188 mg/dL   Post-Exercise  248 mg/dL  137 mg/dL  146 mg/dL  147 mg/dL  138 mg/dL   Pre-Exercise #2  -  118 mg/dL  -  -  -   Row Name 10/25/17 1512 11/01/17 1238 11/03/17 1226 11/08/17 1232       POCT Blood Glucose   Pre-Exercise  143 mg/dL  180 mg/dL  132 mg/dL  243 mg/dL    Post-Exercise  106 mg/dL  173 mg/dL  103 mg/dL  169 mg/dL       Pulmonary Assessment Scores: Pulmonary Assessment Scores    Row Name 08/11/17 0847         ADL UCSD   ADL Phase  Entry       mMRC Score   mMRC Score  2        Pulmonary Function Assessment: Pulmonary Function Assessment - 08/05/17 1054      Breath   Bilateral Breath Sounds  Clear;Decreased    Shortness of Breath  Yes;Limiting activity       Exercise Target Goals:    Exercise Program Goal: Individual exercise prescription set using results from initial 6 min walk test and THRR while considering  patient's activity barriers and safety.    Exercise Prescription Goal: Initial exercise prescription builds to 30-45 minutes a day of aerobic activity, 2-3 days per week.  Home exercise guidelines  will be given to patient during program as part of exercise prescription that the participant will acknowledge.  Activity Barriers & Risk Stratification: Activity Barriers & Cardiac Risk  Stratification - 08/05/17 1040      Activity Barriers & Cardiac Risk Stratification   Activity Barriers  Joint Problems;Deconditioning;Shortness of Breath right knee       6 Minute Walk: 6 Minute Walk    Row Name 08/11/17 0834         6 Minute Walk   Phase  Initial     Distance  1500 feet     Walk Time  6 minutes     # of Rest Breaks  0     MPH  2.84     METS  3.14     RPE  11     Perceived Dyspnea   2     Symptoms  Yes (comment)     Comments  1/10 knee pain     Resting HR  81 bpm     Resting BP  158/64     Resting Oxygen Saturation   96 %     Exercise Oxygen Saturation  during 6 min walk  94 %     Max Ex. HR  119 bpm     Max Ex. BP  200/64     2 Minute Post BP  154/60       Interval HR   1 Minute HR  81     2 Minute HR  95     3 Minute HR  112     4 Minute HR  114     5 Minute HR  118     6 Minute HR  119     2 Minute Post HR  106     Interval Heart Rate?  Yes       Interval Oxygen   Interval Oxygen?  Yes     Baseline Oxygen Saturation %  96 %     1 Minute Oxygen Saturation %  95 %     1 Minute Liters of Oxygen  0 L     2 Minute Oxygen Saturation %  94 %     2 Minute Liters of Oxygen  0 L     3 Minute Oxygen Saturation %  94 %     3 Minute Liters of Oxygen  0 L     4 Minute Oxygen Saturation %  94 %     4 Minute Liters of Oxygen  0 L     5 Minute Oxygen Saturation %  94 %     5 Minute Liters of Oxygen  0 L     6 Minute Oxygen Saturation %  94 %     6 Minute Liters of Oxygen  0 L     2 Minute Post Oxygen Saturation %  95 %     2 Minute Post Liters of Oxygen  0 L        Oxygen Initial Assessment: Oxygen Initial Assessment - 08/11/17 0847      Initial 6 min Walk   Oxygen Used  None      Program Oxygen Prescription   Program Oxygen Prescription  None       Oxygen Re-Evaluation: Oxygen Re-Evaluation    Row Name 08/22/17 1348 09/13/17 1248 10/06/17 0651 10/31/17 1520 11/18/17 0736     Program Oxygen Prescription   Program Oxygen Prescription   None  None  None  None  None     Home Oxygen   Home Oxygen Device  None  None  None  None  None   Sleep Oxygen Prescription  CPAP  CPAP  CPAP  CPAP  CPAP   Liters per minute  0  0  0  0  0   Home Exercise Oxygen Prescription  None  None  None  None  None   Home at Rest Exercise Oxygen Prescription  None  None  None  None  None   Compliance with Home Oxygen Use  No  No does not tolerate CPAP at HS  No  No  No     Goals/Expected Outcomes   Short Term Goals  -  -  -  To learn and exhibit compliance with exercise, home and travel O2 prescription;To learn and understand importance of monitoring SPO2 with pulse oximeter and demonstrate accurate use of the pulse oximeter.;To learn and understand importance of maintaining oxygen saturations>88%;To learn and demonstrate proper pursed lip breathing techniques or other breathing techniques.;To learn and demonstrate proper use of respiratory medications  To learn and exhibit compliance with exercise, home and travel O2 prescription;To learn and understand importance of monitoring SPO2 with pulse oximeter and demonstrate accurate use of the pulse oximeter.;To learn and understand importance of maintaining oxygen saturations>88%;To learn and demonstrate proper pursed lip breathing techniques or other breathing techniques.;To learn and demonstrate proper use of respiratory medications   Long  Term Goals  -  -  -  Exhibits compliance with exercise, home and travel O2 prescription;Verbalizes importance of monitoring SPO2 with pulse oximeter and return demonstration;Maintenance of O2 saturations>88%;Exhibits proper breathing techniques, such as pursed lip breathing or other method taught during program session;Compliance with respiratory medication;Demonstrates proper use of MDI's  Exhibits compliance with exercise, home and travel O2 prescription;Verbalizes importance of monitoring SPO2 with pulse oximeter and return demonstration;Maintenance of O2  saturations>88%;Exhibits proper breathing techniques, such as pursed lip breathing or other method taught during program session;Compliance with respiratory medication;Demonstrates proper use of MDI's   Comments  -  -  -  not compliant with cpap due to the "smell". Encouraged to talk with DR. to get a new machine-reeducated on the importance of use.   not compliant with cpap due to the "smell". Encouraged to talk with DR. to get a new machine-reeducated on the importance of use.       Oxygen Discharge (Final Oxygen Re-Evaluation): Oxygen Re-Evaluation - 11/18/17 0736      Program Oxygen Prescription   Program Oxygen Prescription  None      Home Oxygen   Home Oxygen Device  None    Sleep Oxygen Prescription  CPAP    Liters per minute  0    Home Exercise Oxygen Prescription  None    Home at Rest Exercise Oxygen Prescription  None    Compliance with Home Oxygen Use  No      Goals/Expected Outcomes   Short Term Goals  To learn and exhibit compliance with exercise, home and travel O2 prescription;To learn and understand importance of monitoring SPO2 with pulse oximeter and demonstrate accurate use of the pulse oximeter.;To learn and understand importance of maintaining oxygen saturations>88%;To learn and demonstrate proper pursed lip breathing techniques or other breathing techniques.;To learn and demonstrate proper use of respiratory medications    Long  Term Goals  Exhibits compliance with exercise, home and travel O2 prescription;Verbalizes importance of monitoring SPO2 with pulse oximeter and return demonstration;Maintenance of O2 saturations>88%;Exhibits proper  breathing techniques, such as pursed lip breathing or other method taught during program session;Compliance with respiratory medication;Demonstrates proper use of MDI's    Comments  not compliant with cpap due to the "smell". Encouraged to talk with DR. to get a new machine-reeducated on the importance of use.        Initial  Exercise Prescription: Initial Exercise Prescription - 08/11/17 0800      Date of Initial Exercise RX and Referring Provider   Date  08/09/17    Referring Provider  Dr. Chase Caller      Bike   Level  0.5    Minutes  17      NuStep   Level  2    Minutes  17    METs  1.5      Track   Laps  15    Minutes  17      Prescription Details   Frequency (times per week)  2    Duration  Progress to 45 minutes of aerobic exercise without signs/symptoms of physical distress      Intensity   THRR 40-80% of Max Heartrate  60-119    Ratings of Perceived Exertion  11-13    Perceived Dyspnea  0-4      Progression   Progression  Continue progressive overload as per policy without signs/symptoms or physical distress.      Resistance Training   Training Prescription  Yes    Weight  Orange bands    Reps  10-15       Perform Capillary Blood Glucose checks as needed.  Exercise Prescription Changes: Exercise Prescription Changes    Row Name 08/16/17 1200 08/18/17 1300 08/25/17 1246 09/06/17 1243 09/08/17 1300     Response to Exercise   Blood Pressure (Admit)  152/62  148/56  150/50  166/80  166/50   Blood Pressure (Exercise)  166/62  190/66  190/80  160/60  142/60   Blood Pressure (Exit)  118/60  170/70  120/50  112/60  140/60   Heart Rate (Admit)  97 bpm  94 bpm  87 bpm  94 bpm  90 bpm   Heart Rate (Exercise)  114 bpm  85 bpm  101 bpm  109 bpm  101 bpm   Heart Rate (Exit)  90 bpm  90 bpm  94 bpm  89 bpm  81 bpm   Oxygen Saturation (Admit)  97 %  95 %  96 %  97 %  96 %   Oxygen Saturation (Exercise)  95 %  96 %  96 %  94 %  96 %   Oxygen Saturation (Exit)  95 %  96 %  97 %  97 %  96 %   Rating of Perceived Exertion (Exercise)  11  13  11  11  13    Perceived Dyspnea (Exercise)  1  1  1  1  1    Duration  Progress to 45 minutes of aerobic exercise without signs/symptoms of physical distress  Progress to 45 minutes of aerobic exercise without signs/symptoms of physical distress  Progress to  45 minutes of aerobic exercise without signs/symptoms of physical distress  Progress to 45 minutes of aerobic exercise without signs/symptoms of physical distress  Progress to 45 minutes of aerobic exercise without signs/symptoms of physical distress   Intensity  Other (comment) 40-80% of HRR  Other (comment) 40-80% of HRR  THRR unchanged  THRR unchanged  THRR unchanged     Progression   Progression  Continue to progress workloads to maintain intensity without signs/symptoms of physical distress.  Continue to progress workloads to maintain intensity without signs/symptoms of physical distress.  Continue to progress workloads to maintain intensity without signs/symptoms of physical distress.  Continue to progress workloads to maintain intensity without signs/symptoms of physical distress.  Continue to progress workloads to maintain intensity without signs/symptoms of physical distress.     Resistance Training   Training Prescription  Yes  Yes  Yes  Yes  Yes   Weight  orange bands  orange bands  orange bands  orange bands  orange bands   Reps  10-15  10-15  10-15  10-15  10-15   Time  10 Minutes  10 Minutes  10 Minutes  10 Minutes  10 Minutes     Bike   Level  0.5  -  0.5  0.5  0.5   Minutes  17  -  17  17  17      NuStep   Level  3  3  -  3  2   Minutes  17  17  -  17  17   METs  2  1.9  -  1.8  2     Track   Laps  11  11  14  13   -   Minutes  17  17  17  17   -   Row Name 09/13/17 1200 09/29/17 1300 10/06/17 1200 10/18/17 1400 10/20/17 1200     Response to Exercise   Blood Pressure (Admit)  170/60  148/60  144/64  136/72  150/66   Blood Pressure (Exercise)  160/60  184/74 recheck during exercise 164/60  170/54  160/68  120/60   Blood Pressure (Exit)  124/60  130/60  124/70  124/54  130/76   Heart Rate (Admit)  98 bpm  89 bpm  89 bpm  98 bpm  85 bpm   Heart Rate (Exercise)  108 bpm  106 bpm  108 bpm  113 bpm  115 bpm   Heart Rate (Exit)  87 bpm  87 bpm  87 bpm  96 bpm  93 bpm   Oxygen  Saturation (Admit)  96 %  96 %  98 %  95 %  96 %   Oxygen Saturation (Exercise)  97 %  98 %  96 %  95 %  96 %   Oxygen Saturation (Exit)  96 %  95 %  97 %  93 %  95 %   Rating of Perceived Exertion (Exercise)  13  13  11  11  11    Perceived Dyspnea (Exercise)  3  3  3  1  1    Duration  Progress to 45 minutes of aerobic exercise without signs/symptoms of physical distress  Progress to 45 minutes of aerobic exercise without signs/symptoms of physical distress  Progress to 45 minutes of aerobic exercise without signs/symptoms of physical distress  Progress to 45 minutes of aerobic exercise without signs/symptoms of physical distress  Progress to 45 minutes of aerobic exercise without signs/symptoms of physical distress   Intensity  THRR unchanged  THRR unchanged  THRR unchanged  THRR unchanged  THRR unchanged     Progression   Progression  Continue to progress workloads to maintain intensity without signs/symptoms of physical distress.  Continue to progress workloads to maintain intensity without signs/symptoms of physical distress.  Continue to progress workloads to maintain intensity without signs/symptoms of physical distress.  Continue to progress workloads  to maintain intensity without signs/symptoms of physical distress.  Continue to progress workloads to maintain intensity without signs/symptoms of physical distress.     Resistance Training   Training Prescription  Yes  Yes  Yes  Yes  Yes   Weight  orange bands  orange bands  orange bands  orange bands  orange bands   Reps  10-15  10-15  10-15  10-15  10-15   Time  10 Minutes  10 Minutes  10 Minutes  10 Minutes  10 Minutes     Bike   Level  0.8  0.8  0.8  0.8  -   Minutes  17  17  17  17   -     NuStep   Level  4  -  4  4  6    Minutes  17  -  17  17  17    METs  2.1  -  2.1  1.8  2.6     Track   Laps  14  9  14  19  17    Minutes  17  17  17  17  17    Row Name 10/25/17 1500 11/01/17 1200 11/03/17 1200 11/08/17 1230       Response to  Exercise   Blood Pressure (Admit)  146/64  134/68  140/66  134/60    Blood Pressure (Exercise)  160/68  140/78  170/90  150/60    Blood Pressure (Exit)  158/64  120/56  136/64  114/60    Heart Rate (Admit)  99 bpm  93 bpm  101 bpm  93 bpm    Heart Rate (Exercise)  101 bpm  117 bpm  122 bpm  99 bpm    Heart Rate (Exit)  84 bpm  98 bpm  101 bpm  88 bpm    Oxygen Saturation (Admit)  96 %  96 %  95 %  95 %    Oxygen Saturation (Exercise)  96 %  95 %  95 %  96 %    Oxygen Saturation (Exit)  97 %  97 %  94 %  95 %    Rating of Perceived Exertion (Exercise)  11  13  13  11     Perceived Dyspnea (Exercise)  1  1  2  2     Duration  Progress to 45 minutes of aerobic exercise without signs/symptoms of physical distress  Progress to 45 minutes of aerobic exercise without signs/symptoms of physical distress  Progress to 45 minutes of aerobic exercise without signs/symptoms of physical distress  Progress to 45 minutes of aerobic exercise without signs/symptoms of physical distress    Intensity  THRR unchanged  THRR unchanged  THRR unchanged  THRR unchanged      Progression   Progression  Continue to progress workloads to maintain intensity without signs/symptoms of physical distress.  Continue to progress workloads to maintain intensity without signs/symptoms of physical distress.  Continue to progress workloads to maintain intensity without signs/symptoms of physical distress.  Continue to progress workloads to maintain intensity without signs/symptoms of physical distress.      Resistance Training   Training Prescription  Yes  Yes  Yes  Yes    Weight  orange bands  orange bands  orange bands  orange bands    Reps  10-15  10-15  10-15  10-15    Time  10 Minutes  10 Minutes  10 Minutes  10 Minutes      Bike  Level  0.8  0.8  0.8  0.8    Minutes  17  17  17  17       NuStep   Level  4  4  -  4    Minutes  17  17  -  17    METs  2.1  1.9  -  2.1      Track   Laps  15  15  17  14     Minutes  17  17   17  17       Home Exercise Plan   Plans to continue exercise at  Home (comment)  -  -  -    Frequency  Add 3 additional days to program exercise sessions.  -  -  -       Exercise Comments: Exercise Comments    Row Name 10/25/17 1540           Exercise Comments  Home exercise completed          Exercise Goals and Review:   Exercise Goals Re-Evaluation : Exercise Goals Re-Evaluation    Row Name 08/25/17 1658 08/26/17 0709 09/13/17 0744 09/29/17 0752 10/31/17 0728     Exercise Goal Re-Evaluation   Exercise Goals Review  Increase Physical Activity;Increase Strength and Stamina;Able to understand and use rate of perceived exertion (RPE) scale;Able to understand and use Dyspnea scale;Knowledge and understanding of Target Heart Rate Range (THRR);Understanding of Exercise Prescription  Increase Strength and Stamina;Increase Physical Activity;Able to understand and use Dyspnea scale;Able to understand and use rate of perceived exertion (RPE) scale;Knowledge and understanding of Target Heart Rate Range (THRR);Understanding of Exercise Prescription  Increase Strength and Stamina;Increase Physical Activity;Able to understand and use Dyspnea scale;Able to understand and use rate of perceived exertion (RPE) scale;Knowledge and understanding of Target Heart Rate Range (THRR);Understanding of Exercise Prescription  Increase Strength and Stamina;Increase Physical Activity;Able to understand and use Dyspnea scale;Able to understand and use rate of perceived exertion (RPE) scale;Knowledge and understanding of Target Heart Rate Range (THRR);Understanding of Exercise Prescription  Increase Physical Activity;Able to understand and use rate of perceived exertion (RPE) scale;Knowledge and understanding of Target Heart Rate Range (THRR);Understanding of Exercise Prescription;Able to understand and use Dyspnea scale;Increase Strength and Stamina   Comments  -  Patient has only attended three exercise sessions. Will  cont. to monitor and progress as able.   Patient has only attended 5 exercise sessions. Patient is showing small improvements. Blood pressure medicine has been adjusted due to high BP's during arrival and exercise. Will cont. to monitor and progress as able.   Patient has only attended 6 exercise sessions. Patient is showing small improvements. Blood pressure medicine has been adjusted due to high BP's during arrival and exercise. Will cont. to monitor and progress as able.   Patient's attendance has not been consistent lately due to family conflicts. Home exercise completed. Will highly encourage page to stay active at home. Patient seems motivated to make changes. Is able to walk 15-19 laps (200 ft each) in 15 minutes. Will cont. to monitor and motivate as able.    Expected Outcomes  -  Through exercise at rehab and at home, patient will increase strength and stamina and be able to perform ADL's at home.   Through exercise at rehab and at home, patient will increase strength and stamina and understand how to safely exercise at home.   Through exercise at rehab and at home, patient will increase strength and stamina making  ADL's easier to perform. Patient will also have a better understanding of safe exercise and what they are capable to do outside of clinical supervision.  Through exercise at rehab and at home, patient will increase strength and stamina making ADL's easier to perform. Patient will also have a better understanding of safe exercise and what they are capable to do outside of clinical supervision.   [place] Name 11/18/17 0737             Exercise Goal Re-Evaluation   Exercise Goals Review  Increase Physical Activity;Able to understand and use rate of perceived exertion (RPE) scale;Knowledge and understanding of Target Heart Rate Range (THRR);Understanding of Exercise Prescription;Able to understand and use Dyspnea scale;Increase Strength and Stamina       Comments  Patient is now on medical hold  due to uncontrolled high blood pressure. Patient is working with cardiologist to adjust medicines. Patient's systolic was in the 932'T at rest. Will cont to monitor and motivate once patient returns to rehab.        Expected Outcomes  Through exercise at rehab and at home, patient will increase strength and stamina making ADL's easier to perform. Patient will also have a better understanding of safe exercise and what they are capable to do outside of clinical supervision.          Discharge Exercise Prescription (Final Exercise Prescription Changes): Exercise Prescription Changes - 11/08/17 1230      Response to Exercise   Blood Pressure (Admit)  134/60    Blood Pressure (Exercise)  150/60    Blood Pressure (Exit)  114/60    Heart Rate (Admit)  93 bpm    Heart Rate (Exercise)  99 bpm    Heart Rate (Exit)  88 bpm    Oxygen Saturation (Admit)  95 %    Oxygen Saturation (Exercise)  96 %    Oxygen Saturation (Exit)  95 %    Rating of Perceived Exertion (Exercise)  11    Perceived Dyspnea (Exercise)  2    Duration  Progress to 45 minutes of aerobic exercise without signs/symptoms of physical distress    Intensity  THRR unchanged      Progression   Progression  Continue to progress workloads to maintain intensity without signs/symptoms of physical distress.      Resistance Training   Training Prescription  Yes    Weight  orange bands    Reps  10-15    Time  10 Minutes      Bike   Level  0.8    Minutes  17      NuStep   Level  4    Minutes  17    METs  2.1      Track   Laps  14    Minutes  17       Nutrition:  Target Goals: Understanding of nutrition guidelines, daily intake of sodium <1531m, cholesterol <2060m calories 30% from fat and 7% or less from saturated fats, daily to have 5 or more servings of fruits and vegetables.  Biometrics: Pre Biometrics - 08/05/17 1041      Pre Biometrics   Grip Strength  18 kg        Nutrition Therapy Plan and Nutrition  Goals: Nutrition Therapy & Goals - 09/08/17 1336      Nutrition Therapy   Diet  Carb Modified, Heart Healthy      Personal Nutrition Goals   Nutrition Goal  Identify food quantities necessary  to achieve wt loss of  -2# per week to a goal wt loss of 6-24 lb at graduation from pulmonary rehab.      Intervention Plan   Intervention  Prescribe, educate and counsel regarding individualized specific dietary modifications aiming towards targeted core components such as weight, hypertension, lipid management, diabetes, heart failure and other comorbidities.    Expected Outcomes  Short Term Goal: Understand basic principles of dietary content, such as calories, fat, sodium, cholesterol and nutrients.;Long Term Goal: Adherence to prescribed nutrition plan.       Nutrition Assessments: Nutrition Assessments - 09/08/17 1412      Rate Your Plate Scores   Pre Score  50       Nutrition Goals Re-Evaluation:   Nutrition Goals Discharge (Final Nutrition Goals Re-Evaluation):   Psychosocial: Target Goals: Acknowledge presence or absence of significant depression and/or stress, maximize coping skills, provide positive support system. Participant is able to verbalize types and ability to use techniques and skills needed for reducing stress and depression.  Initial Review & Psychosocial Screening: Initial Psych Review & Screening - 08/05/17 1057      Initial Review   Comments  patient is raising her 39 year old grandson as her son and her two grandsons       Quality of Life Scores:  Scores of 19 and below usually indicate a poorer quality of life in these areas.  A difference of  2-3 points is a clinically meaningful difference.  A difference of 2-3 points in the total score of the Quality of Life Index has been associated with significant improvement in overall quality of life, self-image, physical symptoms, and general health in studies assessing change in quality of life.   PHQ-9: Recent  Review Flowsheet Data    Depression screen [facility] 2/9 08/05/2017 04/26/2017   Decreased Interest 0 0   Down, Depressed, Hopeless 0 0   PHQ - 2 Score 0 0     Interpretation of Total Score  Total Score Depression Severity:  1-4 = Minimal depression, 5-9 = Mild depression, 10-14 = Moderate depression, 15-19 = Moderately severe depression, 20-27 = Severe depression   Psychosocial Evaluation and Intervention: Psychosocial Evaluation - 08/05/17 1056      Psychosocial Evaluation & Interventions   Interventions  Encouraged to exercise with the program and follow exercise prescription    Expected Outcomes  patient will remain free from psychosocial barriers to participation in pulmonary rehab    Continue Psychosocial Services   Follow up required by staff       Psychosocial Re-Evaluation: Psychosocial Re-Evaluation    [place] Name 08/22/17 1350 09/13/17 1252 10/06/17 0654 10/31/17 1144 11/21/17 0857     Psychosocial Re-Evaluation   Current issues with  Current Stress Concerns  Current Stress Concerns  Current Stress Concerns  Current Stress Concerns  Current Stress Concerns   Comments  patient is raising her 3 grandchildren, 1 as her own. she worries that her health will not allow her to see them marry.  patient is raising her 3 grandchildren, 1 as her own. she worries that her health will not allow her to see them marry.  patient is raising her 3 grandchildren, 1 as her own. she worries that her health will not allow her to see them marry.  patient is raising her 3 grandchildren, 1 as her own. she worries that her health will not allow her to see them marry.  patient is raising her 3 grandchildren, 1 as her own. she worries  that her health will not allow her to see them marry.   Expected Outcomes  patient will remain free from psychosocial barriers to participation in pulmonary rehab  patient will remain free from psychosocial barriers to participation in pulmonary rehab  patient will remain free from  psychosocial barriers to participation in pulmonary rehab  patient will remain free from psychosocial barriers to participation in pulmonary rehab  patient will remain free from psychosocial barriers to participation in pulmonary rehab   Interventions  Encouraged to attend Pulmonary Rehabilitation for the exercise  Encouraged to attend Pulmonary Rehabilitation for the exercise  Encouraged to attend Pulmonary Rehabilitation for the exercise  Encouraged to attend Pulmonary Rehabilitation for the exercise  Encouraged to attend Pulmonary Rehabilitation for the exercise   Continue Psychosocial Services   Follow up required by staff  Follow up required by staff  Follow up required by staff  -  Follow up required by staff   Comments  -  patient is raising her 26 year old grandson as her son and her two grandsons  -  -  patient is raising her 28 year old grandson as her son and her two grandsons     Initial Review   Source of Stress Concerns  -  Family  Family  Family  Family      Psychosocial Discharge (Final Psychosocial Re-Evaluation): Psychosocial Re-Evaluation - 11/21/17 0857      Psychosocial Re-Evaluation   Current issues with  Current Stress Concerns    Comments  patient is raising her 3 grandchildren, 1 as her own. she worries that her health will not allow her to see them marry.    Expected Outcomes  patient will remain free from psychosocial barriers to participation in pulmonary rehab    Interventions  Encouraged to attend Pulmonary Rehabilitation for the exercise    Continue Psychosocial Services   Follow up required by staff    Comments  patient is raising her 57 year old grandson as her son and her two grandsons      Initial Review   Source of Stress Concerns  Family       Education: Education Goals: Education classes will be provided on a weekly basis, covering required topics. Participant will state understanding/return demonstration of topics presented.  Learning  Barriers/Preferences: Learning Barriers/Preferences - 08/05/17 1054      Learning Barriers/Preferences   Learning Barriers  None    Learning Preferences  Verbal Instruction       Education Topics: Risk Factor Reduction:  -Group instruction that is supported by a PowerPoint presentation. Instructor discusses the definition of a risk factor, different risk factors for pulmonary disease, and how the heart and lungs work together.     Nutrition for Pulmonary Patient:  -Group instruction provided by PowerPoint slides, verbal discussion, and written materials to support subject matter. The instructor gives an explanation and review of healthy diet recommendations, which includes a discussion on weight management, recommendations for fruit and vegetable consumption, as well as protein, fluid, caffeine, fiber, sodium, sugar, and alcohol. Tips for eating when patients are short of breath are discussed.   Pursed Lip Breathing:  -Group instruction that is supported by demonstration and informational handouts. Instructor discusses the benefits of pursed lip and diaphragmatic breathing and detailed demonstration on how to preform both.     Oxygen Safety:  -Group instruction provided by PowerPoint, verbal discussion, and written material to support subject matter. There is an overview of "What is Oxygen" and "Why do  we need it".  Instructor also reviews how to create a safe environment for oxygen use, the importance of using oxygen as prescribed, and the risks of noncompliance. There is a brief discussion on traveling with oxygen and resources the patient may utilize.   Oxygen Equipment:  -Group instruction provided by [facility] Staff utilizing handouts, written materials, and equipment demonstrations.   PULMONARY REHAB OTHER RESPIRATORY from 11/03/2017 in [place]  Date  09/08/17  Educator  Ace Gins  Instruction Review Code (Retired)  2- meets goals/outcomes       Signs and Symptoms:  -Group instruction provided by written material and verbal discussion to support subject matter. Warning signs and symptoms of infection, stroke, and heart attack are reviewed and when to call the physician/911 reinforced. Tips for preventing the spread of infection discussed.   Advanced Directives:  -Group instruction provided by verbal instruction and written material to support subject matter. Instructor reviews Advanced Directive laws and proper instruction for filling out document.   Pulmonary Video:  -Group video education that reviews the importance of medication and oxygen compliance, exercise, good nutrition, pulmonary hygiene, and pursed lip and diaphragmatic breathing for the pulmonary patient.   Exercise for the Pulmonary Patient:  -Group instruction that is supported by a PowerPoint presentation. Instructor discusses benefits of exercise, core components of exercise, frequency, duration, and intensity of an exercise routine, importance of utilizing pulse oximetry during exercise, safety while exercising, and options of places to exercise outside of rehab.     PULMONARY REHAB OTHER RESPIRATORY from 11/03/2017 in [place]  Date  08/25/17  Educator  Cloyde Reams  Instruction Review Code (Retired)  2- meets goals/outcomes      Pulmonary Medications:  -Engineer, maintenance (IT) group education provided by Art therapist with focus on inhaled medications and proper administration.   Anatomy and Physiology of the Respiratory System and Intimacy:  -Group instruction provided by PowerPoint, verbal discussion, and written material to support subject matter. Instructor reviews respiratory cycle and anatomical components of the respiratory system and their functions. Instructor also reviews differences in obstructive and restrictive respiratory diseases with examples of each. Intimacy, Sex, and Sexuality differences are reviewed with a discussion  on how relationships can change when diagnosed with pulmonary disease. Common sexual concerns are reviewed.   PULMONARY REHAB OTHER RESPIRATORY from 11/03/2017 in [place]  Date  09/29/17  Educator  RN  Instruction Review Code (Retired)  2- meets goals/outcomes      MD DAY -A group question and answer session with a medical doctor that allows participants to ask questions that relate to their pulmonary disease state.   PULMONARY REHAB OTHER RESPIRATORY from 11/03/2017 in [place]  Date  10/20/17  Educator  yacoub  Instruction Review Code (Retired)  2- meets goals/outcomes      OTHER EDUCATION -Group or individual verbal, written, or video instructions that support the educational goals of the pulmonary rehab program.   PULMONARY REHAB OTHER RESPIRATORY from 11/03/2017 in [place]  Date  11/03/17  Educator  EP  Instruction Review Code  1- Verbalizes Understanding      Holiday Eating Survival Tips:  -Group instruction provided by PowerPoint slides, verbal discussion, and written materials to support subject matter. The instructor gives patients tips, tricks, and techniques to help them not only survive but enjoy the holidays despite the onslaught of food that accompanies the holidays.  Knowledge Questionnaire Score:   Core Components/Risk Factors/Patient Goals at Admission: Personal Goals and Risk Factors at Admission - 08/05/17 1055      Core Components/Risk Factors/Patient Goals on Admission    Weight Management  Obesity;Yes    Intervention  Weight Management: Develop a combined nutrition and exercise program designed to reach desired caloric intake, while maintaining appropriate intake of nutrient and fiber, sodium and fats, and appropriate energy expenditure required for the weight goal.;Obesity: Provide education and appropriate resources to help participant work on and attain  dietary goals.;Weight Management/Obesity: Establish reasonable short term and long term weight goals.;Weight Management: Provide education and appropriate resources to help participant work on and attain dietary goals.    Expected Outcomes  Understanding of distribution of calorie intake throughout the day with the consumption of 4-5 meals/snacks;Understanding recommendations for meals to include 15-35% energy as protein, 25-35% energy from fat, 35-60% energy from carbohydrates, less than 246m of dietary cholesterol, 20-35 gm of total fiber daily;Weight Loss: Understanding of general recommendations for a balanced deficit meal plan, which promotes 1-2 lb weight loss per week and includes a negative energy balance of [phone] kcal/d;Short Term: Continue to assess and modify interventions until short term weight is achieved    Improve shortness of breath with ADL's  Yes    Intervention  Provide education, individualized exercise plan and daily activity instruction to help decrease symptoms of SOB with activities of daily living.    Expected Outcomes  Short Term: Achieves a reduction of symptoms when performing activities of daily living.    Develop more efficient breathing techniques such as purse lipped breathing and diaphragmatic breathing; and practicing self-pacing with activity  Yes    Intervention  Provide education, demonstration and support about specific breathing techniuqes utilized for more efficient breathing. Include techniques such as pursed lipped breathing, diaphragmatic breathing and self-pacing activity.    Expected Outcomes  Short Term: Participant will be able to demonstrate and use breathing techniques as needed throughout daily activities.       Core Components/Risk Factors/Patient Goals Review:  Goals and Risk Factor Review    Row Name 08/22/17 1349 09/13/17 1249 10/06/17 [id number] 11/21/17 0851     Core Components/Risk Factors/Patient Goals Review   Personal Goals  Review  Weight Management/Obesity;Improve shortness of breath with ADL's;Develop more efficient breathing techniques such as purse lipped breathing and diaphragmatic breathing and practicing self-pacing with activity.  Weight Management/Obesity;Improve shortness of breath with ADL's;Develop more efficient breathing techniques such as purse lipped breathing and diaphragmatic breathing and practicing self-pacing with activity.  Weight Management/Obesity;Improve shortness of breath with ADL's;Develop more efficient breathing techniques such as purse lipped breathing and diaphragmatic breathing and practicing self-pacing with activity.  Weight Management/Obesity;Improve shortness of breath with ADL's;Develop more efficient breathing techniques such as purse lipped breathing and diaphragmatic breathing and practicing self-pacing with activity.;Hypertension  Weight Management/Obesity;Improve shortness of breath with ADL's;Develop more efficient breathing techniques such as purse lipped breathing and diaphragmatic breathing and practicing self-pacing with activity.;Hypertension   Review  patient has only attended a few sessions since admission and it is too early to evaluate progress towards pulmonary rehab goals  Patient has been limited to workload increases related to high BP. She is not seeing improvement in her shortness of breath at this time. She has seen her cardiologist and has routein appointments for medication adjustments. BP better today. Once BP stable patient will be encouraged to work harder and workloads will be increases at a pace that patient can  physicall tolerate. She is observed utilizing PLB during exertion. Her weight remains stable at 89.2 which is a 2.2 lb loss since admission.  Patient is doing well in pulmonary rehab. She has followed up with her Cardiologist for high BP and has had several appointment for titration of her medication. Her BP appears to be controlled at this point and we are  able to increase her workloads as tolerated. Expected to see greater progression over the next 30 days.  Patient continues to do well in pulmonary rehab. Her BP still remains on the higher side with moderate exertion. She has another follow-up with her cardiologist in a couple of weeks to continue to address her hypertension. Her resting BPs range from mid 130s-150s. She has not begun to loose weight however she has not changed her diet. She is 10 raising a 60 year old son and admits it is stressful. 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 She feels this is where some of the HTN originates from. She feels her SOB is improveing with activity.  patient has been placed on medical hold for hypertension. On 1/31 she was not allowed to exercise related to a check in BP of 192/74. Her SBP was 180 the previous day at her cardiologist appt. Her medications have been changed and she will have a follow up appt at the hypertension clinic on 11/24/17. Spoke with patient on 2/5 and she states her BP remained high and the new medication has caused her CBG to increase to 300. Will follow up with patient after appointment 11/24/17. IF BP continues to be elevated beyond exercise parameters she will be discharged and readmitted once BP better controlled. Patient has been educated on low sodium diet and given handouts.   Expected Outcomes  see admission expected outcomes  see admission expected outcomes  see admission expected outcomes  see admission expected outcomes  see admission expected outcomes      Core Components/Risk Factors/Patient Goals at Discharge (Final Review):  Goals and Risk Factor Review - 11/21/17 0851      Core Components/Risk Factors/Patient Goals Review   Personal Goals Review  Weight Management/Obesity;Improve shortness of breath with ADL's;Develop more efficient breathing techniques such as purse lipped breathing and diaphragmatic breathing  and practicing self-pacing with activity.;Hypertension    Review  patient has been placed on medical hold for hypertension. On 1/31 she was not allowed to exercise related to a check in BP of 192/74. Her SBP was 180 the previous day at her cardiologist appt. Her medications have been changed and she will have a follow up appt at the hypertension clinic on 11/24/17. Spoke with patient on 2/5 and she states her BP remained high and the new medication has caused her CBG to increase to 300. Will follow up with patient after appointment 11/24/17. IF BP continues to be elevated beyond exercise parameters she will be discharged and readmitted once BP better controlled. Patient has been educated on low sodium diet and given handouts.    Expected Outcomes  see admission expected outcomes       ITP Comments:   Comments: patient has attended 14 sessions since admission

## 2017-11-24 ENCOUNTER — Ambulatory Visit (INDEPENDENT_AMBULATORY_CARE_PROVIDER_SITE_OTHER): Payer: Medicare Other | Admitting: Pharmacist

## 2017-11-24 ENCOUNTER — Ambulatory Visit (HOSPITAL_BASED_OUTPATIENT_CLINIC_OR_DEPARTMENT_OTHER): Payer: Medicare Other | Attending: Cardiology | Admitting: Cardiology

## 2017-11-24 ENCOUNTER — Encounter (HOSPITAL_COMMUNITY): Payer: Medicare Other

## 2017-11-24 VITALS — Ht 67.0 in | Wt 190.0 lb

## 2017-11-24 VITALS — BP 164/68 | HR 80

## 2017-11-24 DIAGNOSIS — G4733 Obstructive sleep apnea (adult) (pediatric): Secondary | ICD-10-CM

## 2017-11-24 DIAGNOSIS — I493 Ventricular premature depolarization: Secondary | ICD-10-CM | POA: Diagnosis not present

## 2017-11-24 DIAGNOSIS — R0902 Hypoxemia: Secondary | ICD-10-CM | POA: Diagnosis not present

## 2017-11-24 DIAGNOSIS — I10 Essential (primary) hypertension: Secondary | ICD-10-CM

## 2017-11-24 DIAGNOSIS — R0683 Snoring: Secondary | ICD-10-CM | POA: Insufficient documentation

## 2017-11-24 MED ORDER — HYDRALAZINE HCL 25 MG PO TABS: 25.0000 mg | ORAL_TABLET | Freq: Three times a day (TID) | ORAL | 3 refills | Status: DC

## 2017-11-24 NOTE — Progress Notes (Signed)
Patient ID: Alice Cole                 DOB: 09-18-1946                      MRN: [id number]     HPI: Alice Cole is a 72 y.o. female patient of Dr. Radford Pax who presents today for hypertension follow up. PMH significant for OSA/PVCs and HTN.  She has a history of OSA but does not use CPAP on a regular basis. She has a history of PVCs and normal coronary arteries by cath in 2003. Nuclear stress test showed no ischemiain 2017and 2D echo showed normal LVF with diastolic dysfunction. She was recently started on chlorthalidone for added blood pressure control.    She presents today and states that she was unable to tolerate the chlorthalidone due to terrible stomach pains. She stopped the chlorthalidone and restarted her HCTZ several weeks ago. She reports that her pressures are somewhat better. She denies chest pain, SOB, and dizziness.   Current HTN meds:  HCTZ 25mg  daily  Diltiazem 360mg  daily  Ramipril 10mg  BID  Previously tried: metoprolol (anaphylaxis), olmesartan (anaphylaxis), Beta blockers (bradycardia), cartia XT (itching), lisinopril (nausea), verapamil (swelling), chlorthalidone (made stomach hurt)  BP goal: <130/80  Family History: Mother passed of MI at 48. Brother passed in 52s from cardiac issues.   Social History: No tobacco products or alcohol.   Diet: most prepared from home. Has been avoiding salt recently.   Exercise: Has been doing cardiac/pulmonary rehab  Home BP readings: 130-152/60s mostly 140s  Wt Readings from Last 3 Encounters:  11/24/17 190 lb (86.2 kg)  11/09/17 199 lb 12.8 oz (90.6 kg)  11/08/17 200 lb 2.8 oz (90.8 kg)   BP Readings from Last 3 Encounters:  11/24/17 (!) 164/68  11/09/17 (!) 170/80  09/16/17 128/62   Pulse Readings from Last 3 Encounters:  11/24/17 80  11/09/17 88  09/16/17 94    Renal function: CrCl cannot be calculated (Patient's most recent lab result is older than the maximum 21 days allowed.).  Past Medical History:    Diagnosis Date  . Abnormal EKG 07/09/2016  . Abnormal liver function   . Adenomatous colon polyp   . Bigeminy   . Colonic polyp   . Dermatitis   . Diabetes mellitus   . Diverticulitis    recurrent  . DVT (deep venous thrombosis) ([place])   . Esophageal stricture   . GERD (gastroesophageal reflux disease)   . Hemorrhoids   . Hyperlipidemia   . Hypertension   . OSA (obstructive sleep apnea) 07/09/2016   She has not been using her CPAP device because she needs new supplies  . PVC's (premature ventricular contractions)     Current Outpatient Medications on File Prior to Visit  Medication Sig Dispense Refill  . aspirin EC 81 MG tablet Take 1 tablet (81 mg total) by mouth daily.    Marland Kitchen dicyclomine (BENTYL) 20 MG tablet 1 TABLET FOUR TIMES A DAY BEFORE MEALS ORALLY 30 DAY(S)  5  . diltiazem (CARDIZEM CD) 360 MG 24 hr capsule Take 1 capsule (360 mg total) by mouth daily. 90 capsule 3  . hydrochlorothiazide (HYDRODIURIL) 25 MG tablet Take 25 mg by mouth daily.    . Insulin Aspart (NOVOLOG [place]) Inject 42 Units into the skin 3 (three) times daily. 42 units morning, 42 units lunch, 54 units supper    . metFORMIN (GLUCOPHAGE-XR) 500 MG 24 hr tablet TAKE  4 TABLETS EVERY DAY WITH EVENING MEAL  5  . omeprazole (PRILOSEC) 20 MG capsule Take 20 mg by mouth daily.    . pravastatin (PRAVACHOL) 40 MG tablet Take 40 mg by mouth daily.  11  . ramipril (ALTACE) 10 MG capsule Take 10 mg by mouth 2 (two) times daily.     No current facility-administered medications on file prior to visit.     Allergies  Allergen Reactions  . Metoprolol Anaphylaxis  . Nortriptyline Anaphylaxis  . Olmesartan Anaphylaxis  . Tradjenta [Linagliptin] Shortness Of Breath  . Alatrofloxacin Dermatitis  . Benicar [Olmesartan Medoxomil] Nausea Only and Other (See Comments)    Urinary difficulty  . Beta Adrenergic Blockers Other (See Comments)    Reaction: Low Heart Rate  . Cartia Xt [Diltiazem] Itching  . Cephalexin Nausea And  Vomiting    REACTION: nausea, vomiting, diarrhea  . Codeine Other (See Comments)    tachycardia  . Colesevelam Nausea Only  . Crestor [Rosuvastatin Calcium] Other (See Comments)    Leg cramps  . Flagyl [Metronidazole] Nausea Only  . Fluvastatin Sodium Itching  . Levemir [Insulin Detemir] Itching and Other (See Comments)    Bad mood  . Lisinopril Nausea Only  . Metoprolol Succinate Other (See Comments)    Low heart rate  . Nortriptyline Hcl Other (See Comments)    Reaction unknown  . Pamelor [Nortriptyline Hcl] Other (See Comments)    Nightmares  . Rosuvastatin Other (See Comments)    REACTION: cramps  . Sulfamethoxazole-Trimethoprim Nausea Only and Other (See Comments)    REACTION: nausea, irreg. heartrate  . Verapamil Swelling and Other (See Comments)    Chest pain  . Welchol [Colesevelam Hcl] Nausea And Vomiting  . Adhesive [Tape] Rash    Band Aids  . Sitagliptin Rash  . Trovan [Trovafloxacin] Rash and Other (See Comments)    Blood pressure (!) 164/68, pulse 80.   Assessment/Plan: Hypertension: BP is above goal today and remains above goal at home. Patient is agreeable to starting a new medication at this time. Will add hydralazine 25mg  TID to current regimen. Continue to monitor pressures and follow up in 3-4 weeks.    Thank you, Lelan Pons. Patterson Hammersmith, [facility]  11/25/2017 6:39 AM

## 2017-11-24 NOTE — Patient Instructions (Signed)
START taking hydralazine 25mg  three time a day  Continue monitoring pressures and bring cuff and log to follow up in 3-4 weeks

## 2017-11-25 ENCOUNTER — Encounter: Payer: Self-pay | Admitting: Pharmacist

## 2017-11-27 NOTE — Procedures (Addendum)
NAME: Alice Cole DATE OF BIRTH:  [birth year] MEDICAL RECORD NUMBER [id number]  LOCATION: [facility]  PHYSICIAN: Dorthula Bier  DATE OF STUDY: 11/24/2017  SLEEP STUDY TYPE: Nocturnal Polysomnogram               REFERRING PHYSICIAN: Sueanne Margarita, MD   Gender: Female D.O.B: 08/04/1946 Age (years): 30 Referring Provider: Fransico Him MD, ABSM Height (inches): 82 Interpreting Physician: Fransico Him MD, ABSM Weight (lbs): 190 RPSGT: Baxter Flattery BMI: 30 MRN: [id number] Neck Size: 16.00  CLINICAL INFORMATION Sleep Study Type: NPSG  Indication for sleep study: Diabetes, Obesity, Snoring, Witnesses Apnea / Gasping During Sleep  Epworth Sleepiness Score: 10  SLEEP STUDY TECHNIQUE As per the AASM Manual for the Scoring of Sleep and Associated Events v2.3 (April 2016) with a hypopnea requiring 4% desaturations.  The channels recorded and monitored were frontal, central and occipital EEG, electrooculogram (EOG), submentalis EMG (chin), nasal and oral airflow, thoracic and abdominal wall motion, anterior tibialis EMG, snore microphone, electrocardiogram, and pulse oximetry.  MEDICATIONS Medications self-administered by patient taken the night of the study : N/A  SLEEP ARCHITECTURE The study was initiated at 10:24:11 PM and ended at 4:44:44 AM.  Sleep onset time was 35.9 minutes and the sleep efficiency was 60.4%. The total sleep time was 230.0 minutes.  Stage REM latency was 199.0 minutes.  The patient spent 7.17% of the night in stage N1 sleep, 87.39% in stage N2 sleep, 0.00% in stage N3 and 5.43% in REM.  Alpha intrusion was absent.  Supine sleep was 0.00%.  RESPIRATORY PARAMETERS The overall apnea/hypopnea index (AHI) was 3.7 per hour. There were 1 total apneas, including 1 obstructive, 0 central and 0 mixed apneas. There were 13 hypopneas and 0 RERAs.  The AHI during Stage REM sleep was 24.0 per hour.  AHI while supine was N/A per hour.  The  mean oxygen saturation was 91.33%. The minimum SpO2 during sleep was 82.00%.  loud snoring was noted during this study.  CARDIAC DATA The 2 lead EKG demonstrated sinus rhythm. The mean heart rate was 72.08 beats per minute. Other EKG findings include: PVCs.  LEG MOVEMENT DATA The total PLMS were 0 with a resulting PLMS index of 0.00. Associated arousal with leg movement index was 0.0 .  IMPRESSIONS - No significant obstructive sleep apnea occurred during this study (AHI = 3.7/h) but moderate during REM sleep with AHI 24/hr with hypoxemia.  - No significant central sleep apnea occurred during this study (CAI = 0.0/h). - Moderate oxygen desaturation was noted during this study (Min O2 = 82.00%). - The patient snored with loud snoring volume. - EKG findings include PVCs. - Clinically significant periodic limb movements did not occur during sleep. No significant associated arousals.  DIAGNOSIS - Nocturnal Hypoxemia (327.26 [G47.36 ICD-10])  RECOMMENDATIONS - Upper airway resistance syndrome with  moderate obstructive sleep apnea during REM sleep with an AHI of 24/hr.  Given REM associated sleep disordered breathing and significant hypoxemia, recommend CPAP titration.  - Avoid alcohol, sedatives and other CNS depressants that may worsen sleep apnea and disrupt normal sleep architecture. - Sleep hygiene should be reviewed to assess factors that may improve sleep quality. - Weight management and regular exercise should be initiated or continued if appropriate.  [place], American Board of Sleep Medicine  ELECTRONICALLY SIGNED ON:  11/27/2017, 12:58 PM [place] PH: [phone]   FX: [phone] [place]  MEDICINE

## 2017-11-28 ENCOUNTER — Telehealth (HOSPITAL_COMMUNITY): Payer: Self-pay

## 2017-11-29 ENCOUNTER — Encounter (HOSPITAL_COMMUNITY): Payer: Medicare Other

## 2017-11-29 ENCOUNTER — Telehealth: Payer: Self-pay | Admitting: Cardiology

## 2017-11-29 NOTE — Telephone Encounter (Signed)
Spoke with patient and she reports that she read one person online ended up in the hospital and she is very anxious about taking the medication. She is asking about other medications. We had briefly discussed spironolactone at her visit. We discussed need to monitor electrolytes with this medication. After reassuring her that patients all react to medications differently she is willing to give hydralazine a try. She will begin the medication today and follow up as scheduled on 12/14/17.

## 2017-11-29 NOTE — Telephone Encounter (Signed)
Blood pressure (!) 164/68, pulse 80.   Assessment/Plan: Hypertension: BP is above goal today and remains above goal at home. Patient is agreeable to starting a new medication at this time. Will add hydralazine 25mg  TID to current regimen. Continue to monitor pressures and follow up in 3-4 weeks.    Thank you, Lelan Pons. Patterson Hammersmith, [place]  11/25/2017 6:39 AM   Pt is calling to speak with Tana Coast PharmD about recent HTN clinic appt and new med hydralazine. Pt states she does not want to take this medication, for she read reviews on this medication, and this has caused her extreme anxiety.  Pt states she has a hard time taking meds, and the thought of taking a TID med, overwhelms her.  Pt states that Georgina Peer offered her a po daily med, and she would like to try that regimen first.  Provided pt education and advised her to try hydralazine.  Pt is insisting on not taking this med, and going to a different regimen.  Informed the pt that I will route her concerns to Tana Coast for further review and recommendation.  Informed the pt that someone from the office will follow-up with her shortly thereafter. Pt verbalized understanding and agrees with this plan.

## 2017-11-29 NOTE — Telephone Encounter (Signed)
New message   Pt c/o medication issue:  1. Name of Medication: n/a  2. How are you currently taking this medication (dosage and times per day)? n/a  3. Are you having a reaction (difficulty breathing--STAT)? no  4. What is your medication issue? Pt states that she does not want to take the medication that she was prescribed last visit due to seeing bad reviews. Please call

## 2017-11-30 DIAGNOSIS — M8580 Other specified disorders of bone density and structure, unspecified site: Secondary | ICD-10-CM | POA: Diagnosis not present

## 2017-11-30 DIAGNOSIS — E1165 Type 2 diabetes mellitus with hyperglycemia: Secondary | ICD-10-CM | POA: Diagnosis not present

## 2017-11-30 DIAGNOSIS — Z794 Long term (current) use of insulin: Secondary | ICD-10-CM | POA: Diagnosis not present

## 2017-11-30 DIAGNOSIS — Z5181 Encounter for therapeutic drug level monitoring: Secondary | ICD-10-CM | POA: Diagnosis not present

## 2017-12-01 ENCOUNTER — Encounter (HOSPITAL_COMMUNITY)
Admission: RE | Admit: 2017-12-01 | Discharge: 2017-12-01 | Disposition: A | Discharge status: Home / Self Care | Payer: Medicare Other | Source: Ambulatory Visit | Attending: Internal Medicine | Admitting: Internal Medicine

## 2017-12-01 DIAGNOSIS — R0602 Shortness of breath: Secondary | ICD-10-CM | POA: Diagnosis not present

## 2017-12-01 DIAGNOSIS — R5381 Other malaise: Secondary | ICD-10-CM | POA: Diagnosis not present

## 2017-12-01 LAB — GLUCOSE, CAPILLARY: Glucose-Capillary: 178 mg/dL — ABNORMAL HIGH (ref 65–99)

## 2017-12-01 NOTE — Progress Notes (Signed)
Alice Cole 72 y.o. female   DOB: [birth year] MRN: [id number]          Nutrition 1. Shortness of breath    Meds reviewed. Metformin, Novolog noted. Basaglar recently added Note Spoke with pt. Pt is obese. There are some ways pt can make her eating habits healthier. Pt's Rate Your Plate results reviewed with pt. Pt is now avoiding salty food; uses low- or no-added salt canned food and reads food labels for sodium content. Pt is diabetic. Per discussion, pt saw Dr. Justin Mend this week and her A1c was 8.3, which is "high for me." Pt started on Basaglar insulin. Pt checks CBG's 4 times a day. Fasting CBG's reportedly  "in the 200's."  Pt reports she has been to DM education classes and was instructed to follow a diet with 45 grams of carbs/meal. Barriers to pt's optimal health goals include chronic stress, caring for 2 young men with mental health issues, and a husband who "drinks and smokes all day and doesn't help himself or anybody." Pt c/o Xanax making her sleepy "so I don't want to take it because I can't get anything done." Pt expressed understanding of the information reviewed via feedback method.    Labs:  A1c 03/24/16 7.9 Nutrition Diagnosis ? Food-and nutrition-related knowledge deficit related to lack of exposure to information as related to diagnosis of pulmonary disease ? Obesity related to excessive energy intake as evidenced by a BMI of 30.8  Nutrition Intervention ? Pt's individual nutrition plan and goals reviewed with pt. ? Benefits of adopting healthy eating habits discussed when pt's Rate Your Plate reviewed. ? Pt to attend the Nutrition and Lung Disease class ? Handouts given for Heart Healthy, Low-Sodium diet.  Goal(s) 1. Identify food quantities necessary to achieve wt loss of  -2# per week to a goal wt loss of 6-24 lb at graduation from pulmonary rehab.  Plan:  Pt to attend Pulmonary Nutrition class Will provide client-centered nutrition education as part of interdisciplinary  care.   Monitor and evaluate progress toward nutrition goal with team.  Monitor and Evaluate progress toward nutrition goal with team.   Derek Mound, M.Ed, RD, LDN, CDE 12/01/2017 12:28 PM

## 2017-12-01 NOTE — Progress Notes (Signed)
Daily Session Note  Patient Details  Name: Alice Cole MRN: [id number] Date of Birth: 1945-11-13 Referring Provider:     Pulmonary Rehab Walk Test from 08/09/2017 in [place]  Referring Provider  Dr. Chase Caller      Encounter Date: 12/01/2017  Check In: Session Check In - 12/01/17 1030      Check-In   Location  [facility]    Staff Present  Rosebud Poles, RN, BSN;Molly diVincenzo, MS, ACSM RCEP, Exercise Physiologist;Lisa Ysidro Evert, RN;Portia Rollene Rotunda, RN, BSN    Supervising physician immediately available to respond to emergencies  [facility] immediately available    Physician(s)  Dr. Horris Latino    Medication changes reported      No    Fall or balance concerns reported     No    Tobacco Cessation  No Change    Warm-up and Cool-down  Performed as group-led instruction    Resistance Training Performed  Yes    VAD Patient?  No      Pain Assessment   Currently in Pain?  No/denies    Multiple Pain Sites  No       Capillary Blood Glucose: Results for orders placed or performed during the hospital encounter of 12/01/17 (from the past 24 hour(s))  Glucose, capillary     Status: Abnormal   Collection Time: 12/01/17 11:27 AM  Result Value Ref Range   Glucose-Capillary 178 (H) 65 - 99 mg/dL      Social History   Tobacco Use  Smoking Status Never Smoker  Smokeless Tobacco Never Used    Goals Met:  Exercise tolerated well Strength training completed today  Goals Unmet:  Not Applicable  Comments: Service time is from 1030 to 1200    Dr. Rush Farmer is Medical Director for Pulmonary Rehab at [facility]

## 2017-12-02 ENCOUNTER — Telehealth: Payer: Self-pay | Admitting: *Deleted

## 2017-12-02 DIAGNOSIS — G4733 Obstructive sleep apnea (adult) (pediatric): Secondary | ICD-10-CM

## 2017-12-02 NOTE — Telephone Encounter (Signed)
-----   Message from Sueanne Margarita, MD sent at 11/27/2017  1:00 PM EST ----- Please let patient know that they have sleep apnea and recommend CPAP titration. Please set up titration in the sleep lab.

## 2017-12-02 NOTE — Telephone Encounter (Signed)
order sent to sleep pool

## 2017-12-02 NOTE — Telephone Encounter (Addendum)
Informed patient of sleep study results and patient understanding was verbalized. Patient understands she has no significant obstructive sleep. Patient understands apnea occurred during this study (AHI = 3.7/h) but moderate during REM sleep with AHI 24/hr with hypoxemia. ( night time oxygen drops)  Patient understands Dr Radford Pax recommends a CPAP Titration to treat her sleep apnea. Patient understands her titration study will be done at [facility] sleep lab. Patient understands she will receive a sleep packet in a week or so. Patient understands to call if she does not receive the sleep packet in a timely manner. Patient agrees with treatment and thanked me for call.

## 2017-12-05 ENCOUNTER — Telehealth: Payer: Self-pay | Admitting: Pharmacist

## 2017-12-05 MED ORDER — SPIRONOLACTONE 25 MG PO TABS: 25.0000 mg | ORAL_TABLET | Freq: Once | ORAL | 11 refills | Status: DC

## 2017-12-05 NOTE — Telephone Encounter (Signed)
Pt called clinic and left a voicemail stating that her right leg is hurting and itching since she started hydralazine.  Returned call to patient. She started taking hydralazine on 2/19 and stopped taking it 2/23. She had anxiety the first two days she took her hydralazine and states she couldn't sleep. After a few days, she stated her right leg began hurting and she couldn't walk on it. Denies bruise, swelling, heat, or redness. She states her whole body has been itchy as well although denies rash. Reports symptoms have improved since stopping hydralazine. SBP readings remain elevated at home 130-150s. Symptoms may be related to patient's extreme anxiety regarding taking BP medications.  She is agreeable to starting spironolactone 25mg  daily. She will keep her f/u appt in 1 week in HTN clinic. Will check BMET at that time.

## 2017-12-06 ENCOUNTER — Encounter (HOSPITAL_COMMUNITY)
Admission: RE | Admit: 2017-12-06 | Discharge: 2017-12-06 | Disposition: A | Discharge status: Home / Self Care | Payer: Medicare Other | Source: Ambulatory Visit | Attending: Internal Medicine | Admitting: Internal Medicine

## 2017-12-06 DIAGNOSIS — R0602 Shortness of breath: Secondary | ICD-10-CM

## 2017-12-06 DIAGNOSIS — R5381 Other malaise: Secondary | ICD-10-CM | POA: Diagnosis not present

## 2017-12-07 ENCOUNTER — Encounter: Payer: Self-pay | Admitting: *Deleted

## 2017-12-07 ENCOUNTER — Telehealth: Payer: Self-pay | Admitting: *Deleted

## 2017-12-07 NOTE — Telephone Encounter (Signed)
-----   Message from Almyra Free sent at 12/05/2017 12:00 PM EST ----- Regarding: RE: pre cert Pt can be scheduled. Let me know when and I will enter precert info. Thanks, Amy ----- Message ----- From: Freada Bergeron, CMA Sent: 12/02/2017   4:05 PM To: [facility] Sleep Studies Subject: pre cert                                        recommend CPAP titration

## 2017-12-07 NOTE — Telephone Encounter (Signed)
Informed patient of upcoming Titration study and patient understanding was verbalized. Patient understands her Titration study is scheduled for Saturday Mach 16 2019. Patient understands her titration study will be done at [facility] sleep lab. Patient understands she will receive a sleep packet in a week or so. Patient understands to call if she does not receive the sleep packet in a timely manner. Patient agrees with treatment and thanked me for call.

## 2017-12-08 ENCOUNTER — Encounter (HOSPITAL_COMMUNITY): Payer: Self-pay | Admitting: *Deleted

## 2017-12-14 ENCOUNTER — Encounter: Payer: Self-pay | Admitting: Pharmacist

## 2017-12-14 ENCOUNTER — Other Ambulatory Visit: Payer: Medicare Other

## 2017-12-14 ENCOUNTER — Ambulatory Visit (INDEPENDENT_AMBULATORY_CARE_PROVIDER_SITE_OTHER): Payer: Medicare Other | Admitting: Pharmacist

## 2017-12-14 VITALS — BP 164/72 | HR 83

## 2017-12-14 DIAGNOSIS — I10 Essential (primary) hypertension: Secondary | ICD-10-CM | POA: Diagnosis not present

## 2017-12-14 MED ORDER — SPIRONOLACTONE 25 MG PO TABS: 25.0000 mg | ORAL_TABLET | Freq: Every day | ORAL | 11 refills | Status: DC

## 2017-12-14 NOTE — Progress Notes (Signed)
Patient ID: Alice Cole                 DOB: 1946-05-29                      MRN: [id number]     HPI: Alice Cole is a 72 y.o. female patient of Dr. Radford Pax who presents today for hypertension follow up. PMH significant for OSA/PVCs and HTN.  She has a history of OSA but does not use CPAP on a regular basis. She has a history of PVCs and normal coronary arteries by cath in 2003. Nuclear stress test showed no ischemiain 2017and 2D echo showed normal LVF with diastolic dysfunction. At her most recent OV she was started on hydralazine. She stopped it several days after starting due to leg pains. She called to report that symptoms have improved since stopping and was started on spironolactone 25mg  daily.   She presents today for follow up. She states that the medication was never called in so she never started it.  Denies chest pain, dizziness, SOB. Upon further investigation it does appear it was called in, but she never did pick it up.   Again today we discussed her stressful situation at home taking care of husband, 2 grandsons, and her adopted son.   Current HTN meds:  HCTZ 25mg  daily  Diltiazem 360mg  daily  Ramipril 10mg  BID Spironolactone 25mg  daily - has not started  Previously tried: metoprolol (anaphylaxis), olmesartan (anaphylaxis), Beta blockers (bradycardia), cartia XT (itching), lisinopril (nausea), verapamil (swelling), chlorthalidone (made stomach hurt), hydralazine (leg hurt)  BP goal: <130/80  Family History: Mother passed of MI at 30. Brother passed in 26s from cardiac issues.   Social History: No tobacco products or alcohol.   Diet: most prepared from home. Has been avoiding salt recently.   Exercise: Has been doing cardiac/pulmonary rehab  Home BP readings: 137-170/52-120 - she believes her cuff is inaccurate though   Wt Readings from Last 3 Encounters:  11/24/17 190 lb (86.2 kg)  11/09/17 199 lb 12.8 oz (90.6 kg)  11/08/17 200 lb 2.8 oz (90.8 kg)   BP  Readings from Last 3 Encounters:  12/14/17 (!) 164/72  11/24/17 (!) 164/68  11/09/17 (!) 170/80   Pulse Readings from Last 3 Encounters:  12/14/17 83  11/24/17 80  11/09/17 88    Renal function: CrCl cannot be calculated (Patient's most recent lab result is older than the maximum 21 days allowed.).  Past Medical History:  Diagnosis Date  . Abnormal EKG 07/09/2016  . Abnormal liver function   . Adenomatous colon polyp   . Bigeminy   . Colonic polyp   . Dermatitis   . Diabetes mellitus   . Diverticulitis    recurrent  . DVT (deep venous thrombosis) ([place])   . Esophageal stricture   . GERD (gastroesophageal reflux disease)   . Hemorrhoids   . Hyperlipidemia   . Hypertension   . OSA (obstructive sleep apnea) 07/09/2016   She has not been using her CPAP device because she needs new supplies  . PVC's (premature ventricular contractions)     Current Outpatient Medications on File Prior to Visit  Medication Sig Dispense Refill  . aspirin EC 81 MG tablet Take 1 tablet (81 mg total) by mouth daily.    Marland Kitchen dicyclomine (BENTYL) 20 MG tablet 1 TABLET FOUR TIMES A DAY BEFORE MEALS ORALLY 30 DAY(S)  5  . diltiazem (CARDIZEM CD) 360 MG 24 hr capsule Take  1 capsule (360 mg total) by mouth daily. 90 capsule 3  . hydrochlorothiazide (HYDRODIURIL) 25 MG tablet Take 25 mg by mouth daily.    . Insulin Aspart (NOVOLOG [place]) Inject 42 Units into the skin 3 (three) times daily. 42 units morning, 42 units lunch, 54 units supper    . metFORMIN (GLUCOPHAGE-XR) 500 MG 24 hr tablet TAKE 4 TABLETS EVERY DAY WITH EVENING MEAL  5  . omeprazole (PRILOSEC) 20 MG capsule Take 20 mg by mouth daily.    . pravastatin (PRAVACHOL) 40 MG tablet Take 40 mg by mouth daily.  11  . ramipril (ALTACE) 10 MG capsule Take 10 mg by mouth 2 (two) times daily.     No current facility-administered medications on file prior to visit.     Allergies  Allergen Reactions  . Metoprolol Anaphylaxis  . Nortriptyline Anaphylaxis    . Olmesartan Anaphylaxis  . Tradjenta [Linagliptin] Shortness Of Breath  . Alatrofloxacin Dermatitis  . Benicar [Olmesartan Medoxomil] Nausea Only and Other (See Comments)    Urinary difficulty  . Beta Adrenergic Blockers Other (See Comments)    Reaction: Low Heart Rate  . Cartia Xt [Diltiazem] Itching  . Cephalexin Nausea And Vomiting    REACTION: nausea, vomiting, diarrhea  . Codeine Other (See Comments)    tachycardia  . Colesevelam Nausea Only  . Crestor [Rosuvastatin Calcium] Other (See Comments)    Leg cramps  . Flagyl [Metronidazole] Nausea Only  . Fluvastatin Sodium Itching  . Levemir [Insulin Detemir] Itching and Other (See Comments)    Bad mood  . Lisinopril Nausea Only  . Metoprolol Succinate Other (See Comments)    Low heart rate  . Nortriptyline Hcl Other (See Comments)    Reaction unknown  . Pamelor [Nortriptyline Hcl] Other (See Comments)    Nightmares  . Rosuvastatin Other (See Comments)    REACTION: cramps  . Sulfamethoxazole-Trimethoprim Nausea Only and Other (See Comments)    REACTION: nausea, irreg. heartrate  . Verapamil Swelling and Other (See Comments)    Chest pain  . Welchol [Colesevelam Hcl] Nausea And Vomiting  . Adhesive [Tape] Rash    Band Aids  . Sitagliptin Rash  . Trovan [Trovafloxacin] Rash and Other (See Comments)    Blood pressure (!) 164/72, pulse 83.   Assessment/Plan: Hypertension: BP remains elevated. Will have her start spironolactone 25mg  today (RX resent). Will move BMET to follow up visit in 2 weeks. Again advised to bring cuff and BP machine for verification.   Thank you, Lelan Pons. Patterson Hammersmith, [facility]  12/14/2017 10:56 AM

## 2017-12-14 NOTE — Patient Instructions (Addendum)
No labs today, but we will get blood work at next visit  Return for a follow up appointment in 2 weeks  Check your blood pressure at home daily (if able) and keep record of the readings.  Take your BP meds as follows: START spironolactone 25mg  daily   Bring all of your meds, your BP cuff and your record of home blood pressures to your next appointment.  Exercise as you're able, try to walk approximately 30 minutes per day.  Keep salt intake to a minimum, especially watch canned and prepared boxed foods.  Eat more fresh fruits and vegetables and fewer canned items.  Avoid eating in fast food restaurants.    HOW TO TAKE YOUR BLOOD PRESSURE: . Rest 5 minutes before taking your blood pressure. .  Don't smoke or drink caffeinated beverages for at least 30 minutes before. . Take your blood pressure before (not after) you eat. . Sit comfortably with your back supported and both feet on the floor (don't cross your legs). . Elevate your arm to heart level on a table or a desk. . Use the proper sized cuff. It should fit smoothly and snugly around your bare upper arm. There should be enough room to slip a fingertip under the cuff. The bottom edge of the cuff should be 1 inch above the crease of the elbow. . Ideally, take 3 measurements at one sitting and record the average.

## 2017-12-19 DIAGNOSIS — R5383 Other fatigue: Secondary | ICD-10-CM | POA: Diagnosis not present

## 2017-12-19 DIAGNOSIS — J0101 Acute recurrent maxillary sinusitis: Secondary | ICD-10-CM | POA: Diagnosis not present

## 2017-12-24 ENCOUNTER — Ambulatory Visit (HOSPITAL_BASED_OUTPATIENT_CLINIC_OR_DEPARTMENT_OTHER): Payer: Medicare Other | Attending: Cardiology | Admitting: Cardiology

## 2017-12-24 VITALS — Ht 67.0 in | Wt 199.0 lb

## 2017-12-24 DIAGNOSIS — G4733 Obstructive sleep apnea (adult) (pediatric): Secondary | ICD-10-CM

## 2017-12-24 DIAGNOSIS — I10 Essential (primary) hypertension: Secondary | ICD-10-CM

## 2017-12-25 NOTE — Progress Notes (Deleted)
Patient ID: Alice Cole                 DOB: 11/28/1945                      MRN: [id number]     HPI: Alice Cole is a 72 y.o. female patient of Dr. Radford Pax who presents today for hypertension follow up. PMH significant for OSA/PVCs and HTN.  She has a history of OSA but does not use CPAP on a regular basis. She has a history of PVCs and normal coronary arteries by cath in 2003. Nuclear stress test showed no ischemiain 2017and 2D echo showed normal LVF with diastolic dysfunction. At her most recent OV she was started on spironolactone 25mg  daily after some confusion about it actually being called to the pharmacy.    She presents today for follow up.    Current HTN meds:  HCTZ 25mg  daily  Diltiazem 360mg  daily  Ramipril 10mg  BID Spironolactone 25mg  daily  Previously tried: metoprolol (anaphylaxis), olmesartan (anaphylaxis), Beta blockers (bradycardia), cartia XT (itching), lisinopril (nausea), verapamil (swelling), chlorthalidone (made stomach hurt), hydralazine (leg hurt)  BP goal: <130/80  Family History: Mother passed of MI at 83. Brother passed in 57s from cardiac issues.   Social History: No tobacco products or alcohol.   Diet: most prepared from home. Has been avoiding salt recently.   Exercise: Has been doing cardiac/pulmonary rehab  Home BP readings:   Wt Readings from Last 3 Encounters:  12/24/17 199 lb (90.3 kg)  11/24/17 190 lb (86.2 kg)  11/09/17 199 lb 12.8 oz (90.6 kg)   BP Readings from Last 3 Encounters:  12/14/17 (!) 164/72  11/24/17 (!) 164/68  11/09/17 (!) 170/80   Pulse Readings from Last 3 Encounters:  12/14/17 83  11/24/17 80  11/09/17 88    Renal function: CrCl cannot be calculated (Patient's most recent lab result is older than the maximum 21 days allowed.).  Past Medical History:  Diagnosis Date  . Abnormal EKG 07/09/2016  . Abnormal liver function   . Adenomatous colon polyp   . Bigeminy   . Colonic polyp   . Dermatitis   .  Diabetes mellitus   . Diverticulitis    recurrent  . DVT (deep venous thrombosis) ([place])   . Esophageal stricture   . GERD (gastroesophageal reflux disease)   . Hemorrhoids   . Hyperlipidemia   . Hypertension   . OSA (obstructive sleep apnea) 07/09/2016   She has not been using her CPAP device because she needs new supplies  . PVC's (premature ventricular contractions)     Current Outpatient Medications on File Prior to Visit  Medication Sig Dispense Refill  . aspirin EC 81 MG tablet Take 1 tablet (81 mg total) by mouth daily.    Marland Kitchen dicyclomine (BENTYL) 20 MG tablet 1 TABLET FOUR TIMES A DAY BEFORE MEALS ORALLY 30 DAY(S)  5  . diltiazem (CARDIZEM CD) 360 MG 24 hr capsule Take 1 capsule (360 mg total) by mouth daily. 90 capsule 3  . hydrochlorothiazide (HYDRODIURIL) 25 MG tablet Take 25 mg by mouth daily.    . Insulin Aspart (NOVOLOG [place]) Inject 42 Units into the skin 3 (three) times daily. 42 units morning, 42 units lunch, 54 units supper    . metFORMIN (GLUCOPHAGE-XR) 500 MG 24 hr tablet TAKE 4 TABLETS EVERY DAY WITH EVENING MEAL  5  . omeprazole (PRILOSEC) 20 MG capsule Take 20 mg by mouth daily.    Marland Kitchen  pravastatin (PRAVACHOL) 40 MG tablet Take 40 mg by mouth daily.  11  . ramipril (ALTACE) 10 MG capsule Take 10 mg by mouth 2 (two) times daily.    Marland Kitchen spironolactone (ALDACTONE) 25 MG tablet Take 1 tablet (25 mg total) by mouth daily. 30 tablet 11   No current facility-administered medications on file prior to visit.     Allergies  Allergen Reactions  . Metoprolol Anaphylaxis  . Nortriptyline Anaphylaxis  . Olmesartan Anaphylaxis  . Tradjenta [Linagliptin] Shortness Of Breath  . Alatrofloxacin Dermatitis  . Benicar [Olmesartan Medoxomil] Nausea Only and Other (See Comments)    Urinary difficulty  . Beta Adrenergic Blockers Other (See Comments)    Reaction: Low Heart Rate  . Cartia Xt [Diltiazem] Itching  . Cephalexin Nausea And Vomiting    REACTION: nausea, vomiting, diarrhea  .  Codeine Other (See Comments)    tachycardia  . Colesevelam Nausea Only  . Crestor [Rosuvastatin Calcium] Other (See Comments)    Leg cramps  . Flagyl [Metronidazole] Nausea Only  . Fluvastatin Sodium Itching  . Levemir [Insulin Detemir] Itching and Other (See Comments)    Bad mood  . Lisinopril Nausea Only  . Metoprolol Succinate Other (See Comments)    Low heart rate  . Nortriptyline Hcl Other (See Comments)    Reaction unknown  . Pamelor [Nortriptyline Hcl] Other (See Comments)    Nightmares  . Rosuvastatin Other (See Comments)    REACTION: cramps  . Sulfamethoxazole-Trimethoprim Nausea Only and Other (See Comments)    REACTION: nausea, irreg. heartrate  . Verapamil Swelling and Other (See Comments)    Chest pain  . Welchol [Colesevelam Hcl] Nausea And Vomiting  . Adhesive [Tape] Rash    Band Aids  . Sitagliptin Rash  . Trovan [Trovafloxacin] Rash and Other (See Comments)    There were no vitals taken for this visit.   Assessment/Plan: Hypertension: BP  Increase spironolactone?  Thank you, Lelan Pons. [place], [facility]  12/25/2017 8:21 PM

## 2017-12-26 ENCOUNTER — Ambulatory Visit: Payer: Medicare Other

## 2017-12-26 ENCOUNTER — Other Ambulatory Visit: Payer: Medicare Other

## 2017-12-26 ENCOUNTER — Ambulatory Visit: Payer: Medicare Other | Admitting: Internal Medicine

## 2017-12-26 DIAGNOSIS — H35372 Puckering of macula, left eye: Secondary | ICD-10-CM | POA: Diagnosis not present

## 2017-12-26 DIAGNOSIS — Z961 Presence of intraocular lens: Secondary | ICD-10-CM | POA: Diagnosis not present

## 2017-12-26 DIAGNOSIS — E113392 Type 2 diabetes mellitus with moderate nonproliferative diabetic retinopathy without macular edema, left eye: Secondary | ICD-10-CM | POA: Diagnosis not present

## 2017-12-26 DIAGNOSIS — H2522 Age-related cataract, morgagnian type, left eye: Secondary | ICD-10-CM | POA: Diagnosis not present

## 2017-12-26 DIAGNOSIS — E113391 Type 2 diabetes mellitus with moderate nonproliferative diabetic retinopathy without macular edema, right eye: Secondary | ICD-10-CM | POA: Diagnosis not present

## 2017-12-26 DIAGNOSIS — H35351 Cystoid macular degeneration, right eye: Secondary | ICD-10-CM | POA: Diagnosis not present

## 2017-12-26 NOTE — Procedures (Signed)
NAME: Alice Cole DATE OF BIRTH:  [birth year] MEDICAL RECORD NUMBER [id number]  LOCATION: [facility]  PHYSICIAN: Tisheena Maguire  DATE OF STUDY: 12/24/2017  SLEEP STUDY TYPE: Positive Airway Pressure Titration               REFERRING PHYSICIAN: Sueanne Margarita, MD   Gender: Female D.O.B: January 10, 1946 Age (years): 56 Referring Provider: Fransico Him MD, ABSM Height (inches): 67 Interpreting Physician: Fransico Him MD, ABSM Weight (lbs): 190 RPSGT: Haroldine Laws BMI: 30 MRN: [id number] Neck Size: 16.00  CLINICAL INFORMATION The patient is referred for a CPAP titration to treat sleep apnea.  SLEEP STUDY TECHNIQUE As per the AASM Manual for the Scoring of Sleep and Associated Events v2.3 (April 2016) with a hypopnea requiring 4% desaturations.  The channels recorded and monitored were frontal, central and occipital EEG, electrooculogram (EOG), submentalis EMG (chin), nasal and oral airflow, thoracic and abdominal wall motion, anterior tibialis EMG, snore microphone, electrocardiogram, and pulse oximetry. Continuous positive airway pressure (CPAP) was initiated at the beginning of the study and titrated to treat sleep-disordered breathing.  MEDICATIONS Medications self-administered by patient taken the night of the study : N/A  TECHNICIAN COMMENTS Comments added by technician: Patient was restless all through the night. Patient had difficulty initiating sleep. Patient had more than two awakenings to use the bathroom Comments added by scorer: N/A  RESPIRATORY PARAMETERS Optimal PAP Pressure (cm): 10  AHI at Optimal Pressure (/hr):0 Overall Minimal O2 (%):88.0  Supine % at Optimal Pressure (%): N/A Minimal O2 at Optimal Pressure (%): 92.0   SLEEP ARCHITECTURE The study was initiated at 10:11:49 PM and ended at 4:36:24 AM.  Sleep onset time was 96.2 minutes and the sleep efficiency was 48.7%%. The total sleep time was 187.3 minutes.  The patient  spent 8.5%% of the night in stage N1 sleep, 64.8%% in stage N2 sleep, 20.3%% in stage N3 and 6.41% in REM.Stage REM latency was 138.0 minutes  Wake after sleep onset was 101.0. Alpha intrusion was absent. Supine sleep was 0.00%.  CARDIAC DATA The 2 lead EKG demonstrated sinus rhythm. The mean heart rate was 59.6 beats per minute. Other EKG findings include: PVCs.  LEG MOVEMENT DATA The total Periodic Limb Movements of Sleep (PLMS) were 0. The PLMS index was 0.0. A PLMS index of <15 is considered normal in adults.  IMPRESSIONS - An optimal PAP pressure of 10cm H2O was selected. - Central sleep apnea was not noted during this titration (CAI = 0.0/h). - Mild oxygen desaturations were observed during this titration (min O2 = 88.0%). - The patient snored with soft snoring volume during this titration study. - 2-lead EKG demonstrated: PVCs - Clinically significant periodic limb movements were not noted during this study. Arousals associated with PLMs were rare.  DIAGNOSIS - Obstructive Sleep Apnea (327.23 [G47.33 ICD-10])  RECOMMENDATIONS - Recommend CPAP at 10cm H2O with Respironics Dreamwear small nasal mask with chin strap and heated humidity - Avoid alcohol, sedatives and other CNS depressants that may worsen sleep apnea and disrupt normal sleep architecture. - Sleep hygiene should be reviewed to assess factors that may improve sleep quality. - Weight management and regular exercise should be initiated or continued. - Return to Sleep Center for re-evaluation after 10 weeks of therapy  [place], [facility]  ELECTRONICALLY SIGNED ON:  12/26/2017, 6:58 PM [place] PH: [phone]   FX: [phone] [place]  MEDICINE

## 2018-01-02 ENCOUNTER — Encounter (HOSPITAL_COMMUNITY): Payer: Self-pay

## 2018-01-02 ENCOUNTER — Telehealth: Payer: Self-pay | Admitting: *Deleted

## 2018-01-02 DIAGNOSIS — R0602 Shortness of breath: Secondary | ICD-10-CM

## 2018-01-02 NOTE — Progress Notes (Signed)
Discharge Progress Report  Patient Details  Name: Alice Cole MRN: [id number] Date of Birth: 1946-03-24 Referring Provider:     Pulmonary Rehab Walk Test from 08/09/2017 in [place]  Referring Provider  Dr. Chase Caller       Number of Visits: 16  Reason for Discharge:  Patient reached a stable level of exercise. Patient independent in their exercise. Patient has met program and personal goals.  Smoking History:  Social History   Tobacco Use  Smoking Status Never Smoker  Smokeless Tobacco Never Used    Diagnosis:  Shortness of breath  ADL UCSD: Pulmonary Assessment Scores    Row Name 12/08/17 [phone] 12/08/17 1734       ADL UCSD   ADL Phase  Exit  Exit    SOB Score total  -  46      CAT Score   CAT Score  -  14 Exit      mMRC Score   mMRC Score  1  -       Initial Exercise Prescription:   Discharge Exercise Prescription (Final Exercise Prescription Changes): Exercise Prescription Changes - 11/08/17 1230      Response to Exercise   Blood Pressure (Admit)  134/60    Blood Pressure (Exercise)  150/60    Blood Pressure (Exit)  114/60    Heart Rate (Admit)  93 bpm    Heart Rate (Exercise)  99 bpm    Heart Rate (Exit)  88 bpm    Oxygen Saturation (Admit)  95 %    Oxygen Saturation (Exercise)  96 %    Oxygen Saturation (Exit)  95 %    Rating of Perceived Exertion (Exercise)  11    Perceived Dyspnea (Exercise)  2    Duration  Progress to 45 minutes of aerobic exercise without signs/symptoms of physical distress    Intensity  THRR unchanged      Progression   Progression  Continue to progress workloads to maintain intensity without signs/symptoms of physical distress.      Resistance Training   Training Prescription  Yes    Weight  orange bands    Reps  10-15    Time  10 Minutes      Bike   Level  0.8    Minutes  17      NuStep   Level  4    Minutes  17    METs  2.1      Track   Laps  14    Minutes  17        Functional Capacity: [place] Name 12/08/17 0734         6 Minute Walk   Phase  Discharge     Distance  1748 feet     Distance Feet Change  248 ft     Walk Time  6 minutes     # of Rest Breaks  0     MPH  3.31     METS  3.53     RPE  13     Perceived Dyspnea   4     Symptoms  Yes (comment)     Comments  10/10 knee pain     Resting HR  98 bpm     Resting BP  140/60     Resting Oxygen Saturation   96 %     Exercise Oxygen Saturation  during 6 min walk  95 %  Max Ex. HR  134 bpm     Max Ex. BP  184/58     2 Minute Post BP  158/60       Interval HR   1 Minute HR  100     2 Minute HR  109     3 Minute HR  120     4 Minute HR  129     5 Minute HR  130     6 Minute HR  134     2 Minute Post HR  115     Interval Heart Rate?  Yes       Interval Oxygen   Interval Oxygen?  Yes     Baseline Oxygen Saturation %  96 %     1 Minute Oxygen Saturation %  97 %     1 Minute Liters of Oxygen  0 L     2 Minute Oxygen Saturation %  95 %     2 Minute Liters of Oxygen  0 L     3 Minute Oxygen Saturation %  96 %     3 Minute Liters of Oxygen  0 L     4 Minute Oxygen Saturation %  96 %     4 Minute Liters of Oxygen  0 L     5 Minute Oxygen Saturation %  97 %     5 Minute Liters of Oxygen  0 L     6 Minute Oxygen Saturation %  95 %     6 Minute Liters of Oxygen  0 L     2 Minute Post Oxygen Saturation %  97 %     2 Minute Post Liters of Oxygen  0 L        Psychological, QOL, Others - Outcomes: PHQ 2/9: Depression screen [facility] 2/9 08/05/2017 04/26/2017  Decreased Interest 0 0  Down, Depressed, Hopeless 0 0  PHQ - 2 Score 0 0    Quality of Life:   Personal Goals: Goals established at orientation with interventions provided to work toward goal.    Personal Goals Discharge: Goals and Risk Factor Review    Row Name 11/21/17 0851 01/02/18 1159           Core Components/Risk Factors/Patient Goals Review   Personal Goals Review  Weight  Management/Obesity;Improve shortness of breath with ADL's;Develop more efficient breathing techniques such as purse lipped breathing and diaphragmatic breathing and practicing self-pacing with activity.;Hypertension  Weight Management/Obesity;Improve shortness of breath with ADL's;Develop more efficient breathing techniques such as purse lipped breathing and diaphragmatic breathing and practicing self-pacing with activity.;Hypertension      Review  patient has been placed on medical hold for hypertension. On 1/31 she was not allowed to exercise related to a check in BP of 192/74. Her SBP was 180 the previous day at her cardiologist appt. Her medications have been changed and she will have a follow up appt at the hypertension clinic on 11/24/17. Spoke with patient on 2/5 and she states her BP remained high and the new medication has caused her CBG to increase to 300. Will follow up with patient after appointment 11/24/17. IF BP continues to be elevated beyond exercise parameters she will be discharged and readmitted once BP better controlled. Patient has been educated on low sodium diet and given handouts.  unfortunately patient did not meet her weight loss goal. she did not have regular attendance related to her uncontrolled hypertension. She was having close follow-up with  her cardiologist. she went through several medication changes. she says her shortness of breath was slightly improved but it would have been more profound if her attendance was more consistant.      Expected Outcomes  see admission expected outcomes  see admission expected outcomes         Exercise Goals and Review:   Nutrition & Weight - Outcomes:  Post Biometrics - 12/08/17 0736       Post  Biometrics   Grip Strength  28 kg       Nutrition:   Nutrition Discharge: Nutrition Assessments - 12/12/17 0933      Rate Your Plate Scores   Pre Score  50    Post Score  53       Education Questionnaire Score: Knowledge  Questionnaire Score - 12/08/17 1733      Knowledge Questionnaire Score   Post Score  11/13

## 2018-01-02 NOTE — Addendum Note (Signed)
Encounter addended by: Gaspar Bidding on: 01/02/2018 7:36 AM  Actions taken: Visit Navigator Flowsheet section accepted

## 2018-01-02 NOTE — Telephone Encounter (Signed)
LMTCB for results. 

## 2018-01-02 NOTE — Telephone Encounter (Signed)
-----   Message from Sueanne Margarita, MD sent at 12/26/2017  7:02 PM EDT ----- Please let patient know that they had a successful PAP titration and let DME know that orders are in EPIC.  Please set up 10 week OV with me.

## 2018-01-09 NOTE — Telephone Encounter (Signed)
Informed patient of sleep study results and patient understanding was verbalized. Patient understands she had a successful CPAP titration and doctor Radford Pax has ordered her a CPAP. Patient understands she will be contacted by [facility] to set up her cpap. Patient understands to call if [facility] does not contact her with new setup in a timely manner. Patient understands they will be called once confirmation has been received from [facility] that they have received their new machine to schedule 10 week follow up appointment.  [facility] notified of new cpap order  Please add to airview Patient was grateful for the call and thanked me.

## 2018-01-17 DIAGNOSIS — J322 Chronic ethmoidal sinusitis: Secondary | ICD-10-CM | POA: Diagnosis not present

## 2018-01-17 DIAGNOSIS — J370 Chronic laryngitis: Secondary | ICD-10-CM | POA: Diagnosis not present

## 2018-01-17 DIAGNOSIS — J320 Chronic maxillary sinusitis: Secondary | ICD-10-CM | POA: Diagnosis not present

## 2018-01-17 DIAGNOSIS — R130 Aphagia: Secondary | ICD-10-CM | POA: Diagnosis not present

## 2018-01-17 DIAGNOSIS — J342 Deviated nasal septum: Secondary | ICD-10-CM | POA: Diagnosis not present

## 2018-01-20 NOTE — Telephone Encounter (Signed)
Per Ivin Booty at [facility] patient does not qualify for a CPAP based on her PSG study which only recorded a AHI of 3.7. Ivin Booty says patient will not be set up with CPAP. Will send to Dr Radford Pax for advisement.

## 2018-01-20 NOTE — Telephone Encounter (Signed)
The patient had OSA that was significant during REM sleep with significant oygen desaturations.  Please order Airsense CPAP and place on auto CPAP from 4 to 18cm H2O with heated humidity and mask of choice and followup with me in 2 weeks to see if she is improved

## 2018-01-24 DIAGNOSIS — J370 Chronic laryngitis: Secondary | ICD-10-CM | POA: Diagnosis not present

## 2018-01-24 DIAGNOSIS — J410 Simple chronic bronchitis: Secondary | ICD-10-CM | POA: Diagnosis not present

## 2018-01-24 DIAGNOSIS — J320 Chronic maxillary sinusitis: Secondary | ICD-10-CM | POA: Diagnosis not present

## 2018-01-24 DIAGNOSIS — J342 Deviated nasal septum: Secondary | ICD-10-CM | POA: Diagnosis not present

## 2018-01-27 NOTE — Telephone Encounter (Signed)
Order faxed to [facility] with new information attached and 2 week f/u

## 2018-02-02 DIAGNOSIS — J309 Allergic rhinitis, unspecified: Secondary | ICD-10-CM | POA: Diagnosis not present

## 2018-02-02 DIAGNOSIS — Z794 Long term (current) use of insulin: Secondary | ICD-10-CM | POA: Diagnosis not present

## 2018-02-03 NOTE — Telephone Encounter (Addendum)
Per Ivin Booty at [facility]  your note on the 11/24/17 sleep study says " IMPRESSIONS - No significant obstructive sleep apnea occurred during this study (AHI = 3.7/h) and RECOMMENDATIONS - Overall no significant sleep apnea but during REM sleep there is moderate obstructive sleep apnea with an AHI of 24/hr. " Ivin Booty says medicare will only see you said "NO OSA ". Ivin Booty says you are asking for a cpap but your note says the patient has NO OSA. Ivin Booty states it could cause [facility] not to get paid by medicare. She is happy to speak with you directly [phone]). Ivin Booty says "if you addend your note she is happy to complete the order for patient otherwise she is afraid medicare will not pay choice.

## 2018-02-03 NOTE — Telephone Encounter (Signed)
Insurance will sometimes pay if we get a 2 week autotitration and patient has improvement in sx

## 2018-02-07 NOTE — Telephone Encounter (Signed)
Ivin Booty states if you would change your verbiage in your impressions and your recommendations it would fix things all the way around and she can move forward with her setting her up.

## 2018-02-07 NOTE — Telephone Encounter (Signed)
Procedure note for PSG in February revised

## 2018-02-09 NOTE — Telephone Encounter (Signed)
Thank you new revised notes faxed to [facility].

## 2018-04-10 DIAGNOSIS — N3001 Acute cystitis with hematuria: Secondary | ICD-10-CM | POA: Diagnosis not present

## 2018-05-05 DIAGNOSIS — Z5181 Encounter for therapeutic drug level monitoring: Secondary | ICD-10-CM | POA: Diagnosis not present

## 2018-05-05 DIAGNOSIS — Z794 Long term (current) use of insulin: Secondary | ICD-10-CM | POA: Diagnosis not present

## 2018-05-05 DIAGNOSIS — M8580 Other specified disorders of bone density and structure, unspecified site: Secondary | ICD-10-CM | POA: Diagnosis not present

## 2018-05-05 DIAGNOSIS — E1165 Type 2 diabetes mellitus with hyperglycemia: Secondary | ICD-10-CM | POA: Diagnosis not present

## 2018-05-08 DIAGNOSIS — M79643 Pain in unspecified hand: Secondary | ICD-10-CM | POA: Diagnosis not present

## 2018-05-08 DIAGNOSIS — I10 Essential (primary) hypertension: Secondary | ICD-10-CM | POA: Diagnosis not present

## 2018-05-09 ENCOUNTER — Other Ambulatory Visit: Payer: Self-pay | Admitting: Family Medicine

## 2018-05-09 ENCOUNTER — Ambulatory Visit
Admission: RE | Admit: 2018-05-09 | Discharge: 2018-05-09 | Disposition: A | Discharge status: Home / Self Care | Payer: Medicare Other | Source: Ambulatory Visit | Attending: Family Medicine | Admitting: Family Medicine

## 2018-05-09 DIAGNOSIS — M19041 Primary osteoarthritis, right hand: Secondary | ICD-10-CM | POA: Diagnosis not present

## 2018-05-09 DIAGNOSIS — M79641 Pain in right hand: Secondary | ICD-10-CM

## 2018-05-22 DIAGNOSIS — Z794 Long term (current) use of insulin: Secondary | ICD-10-CM | POA: Diagnosis not present

## 2018-05-22 DIAGNOSIS — E119 Type 2 diabetes mellitus without complications: Secondary | ICD-10-CM | POA: Diagnosis not present

## 2018-07-20 DIAGNOSIS — I10 Essential (primary) hypertension: Secondary | ICD-10-CM | POA: Diagnosis not present

## 2018-07-20 DIAGNOSIS — R457 State of emotional shock and stress, unspecified: Secondary | ICD-10-CM | POA: Diagnosis not present

## 2018-07-20 DIAGNOSIS — R000 Tachycardia, unspecified: Secondary | ICD-10-CM | POA: Diagnosis not present

## 2018-07-20 DIAGNOSIS — T887XXA Unspecified adverse effect of drug or medicament, initial encounter: Secondary | ICD-10-CM | POA: Diagnosis not present

## 2018-07-21 ENCOUNTER — Other Ambulatory Visit: Payer: Self-pay

## 2018-07-21 ENCOUNTER — Encounter (HOSPITAL_COMMUNITY): Payer: Self-pay | Admitting: Emergency Medicine

## 2018-07-21 ENCOUNTER — Emergency Department (HOSPITAL_COMMUNITY)
Admission: EM | Admit: 2018-07-21 | Discharge: 2018-07-21 | Disposition: A | Discharge status: Home / Self Care | Payer: Medicare Other | Attending: Emergency Medicine | Admitting: Emergency Medicine

## 2018-07-21 DIAGNOSIS — Z7984 Long term (current) use of oral hypoglycemic drugs: Secondary | ICD-10-CM | POA: Insufficient documentation

## 2018-07-21 DIAGNOSIS — T502X1A Poisoning by carbonic-anhydrase inhibitors, benzothiadiazides and other diuretics, accidental (unintentional), initial encounter: Secondary | ICD-10-CM | POA: Diagnosis not present

## 2018-07-21 DIAGNOSIS — T461X1A Poisoning by calcium-channel blockers, accidental (unintentional), initial encounter: Secondary | ICD-10-CM | POA: Diagnosis not present

## 2018-07-21 DIAGNOSIS — T471X1A Poisoning by other antacids and anti-gastric-secretion drugs, accidental (unintentional), initial encounter: Secondary | ICD-10-CM | POA: Diagnosis not present

## 2018-07-21 DIAGNOSIS — Z79899 Other long term (current) drug therapy: Secondary | ICD-10-CM | POA: Diagnosis not present

## 2018-07-21 DIAGNOSIS — T50901A Poisoning by unspecified drugs, medicaments and biological substances, accidental (unintentional), initial encounter: Secondary | ICD-10-CM

## 2018-07-21 DIAGNOSIS — E11649 Type 2 diabetes mellitus with hypoglycemia without coma: Secondary | ICD-10-CM | POA: Insufficient documentation

## 2018-07-21 DIAGNOSIS — T39011A Poisoning by aspirin, accidental (unintentional), initial encounter: Secondary | ICD-10-CM | POA: Diagnosis not present

## 2018-07-21 DIAGNOSIS — Z7982 Long term (current) use of aspirin: Secondary | ICD-10-CM | POA: Insufficient documentation

## 2018-07-21 DIAGNOSIS — I10 Essential (primary) hypertension: Secondary | ICD-10-CM | POA: Insufficient documentation

## 2018-07-21 DIAGNOSIS — E162 Hypoglycemia, unspecified: Secondary | ICD-10-CM

## 2018-07-21 LAB — RAPID URINE DRUG SCREEN, HOSP PERFORMED
Amphetamines: NOT DETECTED
Barbiturates: NOT DETECTED
Benzodiazepines: NOT DETECTED
Cocaine: NOT DETECTED
Opiates: NOT DETECTED
Tetrahydrocannabinol: NOT DETECTED

## 2018-07-21 LAB — CBC
HCT: 39.6 % (ref 36.0–46.0)
Hemoglobin: 13.1 g/dL (ref 12.0–15.0)
MCH: 29.3 pg (ref 26.0–34.0)
MCHC: 33.1 g/dL (ref 30.0–36.0)
MCV: 88.6 fL (ref 80.0–100.0)
Platelets: 248 10*3/uL (ref 150–400)
RBC: 4.47 MIL/uL (ref 3.87–5.11)
RDW: 12.2 % (ref 11.5–15.5)
WBC: 8.5 10*3/uL (ref 4.0–10.5)
nRBC: 0 % (ref 0.0–0.2)

## 2018-07-21 LAB — COMPREHENSIVE METABOLIC PANEL
ALT: 41 U/L (ref 0–44)
AST: 42 U/L — ABNORMAL HIGH (ref 15–41)
Albumin: 4 g/dL (ref 3.5–5.0)
Alkaline Phosphatase: 78 U/L (ref 38–126)
Anion gap: 9 (ref 5–15)
BUN: 15 mg/dL (ref 8–23)
CO2: 28 mmol/L (ref 22–32)
Calcium: 9.5 mg/dL (ref 8.9–10.3)
Chloride: 104 mmol/L (ref 98–111)
Creatinine, Ser: 1.13 mg/dL — ABNORMAL HIGH (ref 0.44–1.00)
GFR calc Af Amer: 55 mL/min — ABNORMAL LOW (ref >60)
GFR calc non Af Amer: 47 mL/min — ABNORMAL LOW (ref >60)
Glucose, Bld: 148 mg/dL — ABNORMAL HIGH (ref 70–99)
Potassium: 3.5 mmol/L (ref 3.5–5.1)
Sodium: 141 mmol/L (ref 135–145)
Total Bilirubin: 0.6 mg/dL (ref 0.3–1.2)
Total Protein: 7 g/dL (ref 6.5–8.1)

## 2018-07-21 LAB — ACETAMINOPHEN LEVEL: Acetaminophen (Tylenol), Serum: <10 ug/mL — ABNORMAL LOW (ref 10–30)

## 2018-07-21 LAB — SALICYLATE LEVEL: Salicylate Lvl: <7 mg/dL (ref 2.8–30.0)

## 2018-07-21 LAB — ETHANOL: Alcohol, Ethyl (B): <10 mg/dL (ref <10)

## 2018-07-21 NOTE — ED Notes (Signed)
Patient verbalizes understanding of discharge instructions. Opportunity for questioning and answers were provided. Armband removed by staff, pt discharged from ED home via POV.  

## 2018-07-21 NOTE — ED Provider Notes (Addendum)
[facility] EMERGENCY DEPARTMENT Provider Note   CSN: [id number] Arrival date & time: 07/21/18  0032     History   Chief Complaint Chief Complaint  Patient presents with  . Drug Overdose    HPI Alice Cole is a 72 y.o. female.  Patient presents to the emergency department after accidental overdose of her medications.  Patient reports that she accidentally took her medications for tomorrow morning tonight at around 11:15 PM.  She took her normal nighttime dose of ramipril but also took an extra aspirin, Prilosec 20 mg, hydrochlorothiazide 25 mg and diltiazem 360 mg tablet.  She called poison control was told to come to the ER because of the Cardizem.  She is asymptomatic at arrival.  Patient reports that this was an honest mistake, she is not suicidal.     Past Medical History:  Diagnosis Date  . Abnormal EKG 07/09/2016  . Abnormal liver function   . Adenomatous colon polyp   . Bigeminy   . Colonic polyp   . Dermatitis   . Diabetes mellitus   . Diverticulitis    recurrent  . DVT (deep venous thrombosis) ([place])   . Esophageal stricture   . GERD (gastroesophageal reflux disease)   . Hemorrhoids   . Hyperlipidemia   . Hypertension   . OSA (obstructive sleep apnea) 07/09/2016   She has not been using her CPAP device because she needs new supplies  . PVC's (premature ventricular contractions)     Patient Active Problem List   Diagnosis Date Noted  . Chest pressure 11/04/2016  . OSA (obstructive sleep apnea) 07/09/2016  . Abnormal EKG 07/09/2016  . Heart palpitations 07/09/2016  . Premature ventricular contraction 12/27/2011  . Essential hypertension, benign 11/02/2010  . SOB (shortness of breath) 06/26/2010  . Mixed hyperlipidemia 12/25/2009  . BIGEMINY 12/25/2009  . DIARRHEA-PRESUMED INFECTIOUS 08/06/2009  . AODM 01/01/2009  . ABDOMINAL PAIN-EPIGASTRIC 12/26/2008  . ABDOMINAL PAIN, GENERALIZED 12/26/2008  . PERSONAL HX COLONIC POLYPS 12/26/2008    . ADENOMATOUS COLONIC POLYP 12/17/2008  . HEMORRHOIDS, INTERNAL 12/17/2008  . ESOPHAGEAL STRICTURE 12/17/2008  . GERD 12/17/2008  . DIVERTICULOSIS, COLON 12/17/2008    Past Surgical History:  Procedure Laterality Date  . ABDOMINAL HYSTERECTOMY    . CHOLECYSTECTOMY     aprox 10 years ago  . EYE SURGERY     right and left eye  . KNEE SURGERY     Right  . KNEE SURGERY     Left     OB History   None      Home Medications    Prior to Admission medications   Medication Sig Start Date End Date Taking? Authorizing Provider  aspirin EC 81 MG tablet Take 1 tablet (81 mg total) by mouth daily. 09/05/17   Imogene Burn, PA-C  dicyclomine (BENTYL) 20 MG tablet 1 TABLET FOUR TIMES A DAY BEFORE MEALS ORALLY 30 DAY(S) 08/31/17   [provider]  diltiazem (CARDIZEM CD) 360 MG 24 hr capsule Take 1 capsule (360 mg total) by mouth daily. 09/05/17   Imogene Burn, PA-C  hydrochlorothiazide (HYDRODIURIL) 25 MG tablet Take 25 mg by mouth daily.    [provider]  Insulin Aspart (NOVOLOG [place]) Inject 42 Units into the skin 3 (three) times daily. 42 units morning, 42 units lunch, 54 units supper    [provider]  metFORMIN (GLUCOPHAGE-XR) 500 MG 24 hr tablet TAKE 4 TABLETS EVERY DAY WITH EVENING MEAL 05/29/17   [provider]  omeprazole (PRILOSEC) 20 MG capsule Take 20 mg by mouth daily.    [provider]  pravastatin (PRAVACHOL) 40 MG tablet Take 40 mg by mouth daily. 05/28/17   [provider]  ramipril (ALTACE) 10 MG capsule Take 10 mg by mouth 2 (two) times daily.    [provider]  spironolactone (ALDACTONE) 25 MG tablet Take 1 tablet (25 mg total) by mouth daily. 12/14/17   Sueanne Margarita, MD    Family History Family History  Problem Relation Age of Onset  . Heart attack Mother 77  . Heart attack Brother 1  . Heart attack Maternal Grandmother 67  . Heart attack Maternal Grandfather 63  . Cancer Daughter         Ovarian Cancer  . Diabetes Other        Grandparent  . Heart disease Other        Grandparent    Social History Social History   Tobacco Use  . Smoking status: Never Smoker  . Smokeless tobacco: Never Used  Substance Use Topics  . Alcohol use: No    Comment: used to drink 30 yrs ago  . Drug use: No     Allergies   Metoprolol; Nortriptyline; Olmesartan; Tradjenta [linagliptin]; Alatrofloxacin; Benicar [olmesartan medoxomil]; Beta adrenergic blockers; Cartia xt [diltiazem]; Cephalexin; Codeine; Colesevelam; Crestor [rosuvastatin calcium]; Flagyl [metronidazole]; Fluvastatin sodium; Levemir [insulin detemir]; Lisinopril; Metoprolol succinate; Nortriptyline hcl; Pamelor [nortriptyline hcl]; Rosuvastatin; Sulfamethoxazole-trimethoprim; Verapamil; Welchol [colesevelam hcl]; Adhesive [tape]; Sitagliptin; and Trovan [trovafloxacin]   Review of Systems Review of Systems  All other systems reviewed and are negative.    Physical Exam Updated Vital Signs BP 128/86   Pulse 78   Temp 98 F (36.7 C) (Oral)   Resp (!) 24   Ht 5\' 6"  (1.676 m)   Wt 88.5 kg   LMP  (Exact Date)   SpO2 95%   BMI 31.47 kg/m   Physical Exam  Constitutional: She is oriented to person, place, and time. She appears well-developed and well-nourished. No distress.  HENT:  Head: Normocephalic and atraumatic.  Right Ear: Hearing normal.  Left Ear: Hearing normal.  Nose: Nose normal.  Mouth/Throat: Oropharynx is clear and moist and mucous membranes are normal.  Eyes: Pupils are equal, round, and reactive to light. Conjunctivae and EOM are normal.  Neck: Normal range of motion. Neck supple.  Cardiovascular: Regular rhythm, S1 normal and S2 normal. Exam reveals no gallop and no friction rub.  No murmur heard. Pulmonary/Chest: Effort normal and breath sounds normal. No respiratory distress. She exhibits no tenderness.  Abdominal: Soft. Normal appearance and bowel sounds are normal. There is no  hepatosplenomegaly. There is no tenderness. There is no rebound, no guarding, no tenderness at McBurney's point and negative Murphy's sign. No hernia.  Musculoskeletal: Normal range of motion.  Neurological: She is alert and oriented to person, place, and time. She has normal strength. No cranial nerve deficit or sensory deficit. Coordination normal. GCS eye subscore is 4. GCS verbal subscore is 5. GCS motor subscore is 6.  Skin: Skin is warm, dry and intact. No rash noted. No cyanosis.  Psychiatric: She has a normal mood and affect. Her speech is normal and behavior is normal. Thought content normal.  Nursing note and vitals reviewed.    ED Treatments / Results  Labs (all labs ordered are listed, but only abnormal results are displayed) Labs Reviewed  COMPREHENSIVE METABOLIC PANEL - Abnormal; Notable for the following components:  Result Value   Glucose, Bld 148 (*)    Creatinine, Ser 1.13 (*)    AST 42 (*)    GFR calc non Af Amer 47 (*)    GFR calc Af Amer 55 (*)    All other components within normal limits  ACETAMINOPHEN LEVEL - Abnormal; Notable for the following components:   Acetaminophen (Tylenol), Serum <10 (*)    All other components within normal limits  ETHANOL  SALICYLATE LEVEL  CBC  RAPID URINE DRUG SCREEN, HOSP PERFORMED  CBG MONITORING, ED    EKG EKG Interpretation  Date/Time:  Friday July 21 2018 00:47:54 EDT Ventricular Rate:  91 PR Interval:    QRS Duration: 96 QT Interval:  341 QTC Calculation: 420 R Axis:   -29 Text Interpretation:  Sinus rhythm Borderline left axis deviation Low voltage, precordial leads Nonspecific T abnormalities, lateral leads Confirmed by Orpah Greek [phone]) on 07/21/2018 1:47:47 AM   Radiology No results found.  Procedures Procedures (including critical care time)  Medications Ordered in ED Medications - No data to display   Initial Impression / Assessment and Plan / ED Course  I have reviewed the  triage vital signs and the nursing notes.  Pertinent labs & imaging results that were available during my care of the patient were reviewed by me and considered in my medical decision making (see chart for details).    Patient presented to the ER at the recommendation of poison control.  Patient accidentally took her morning medications this evening.  This resulted in a duplication of her hydrochlorothiazide and Cardizem.  She has been monitored for an extended period of time, no bradycardia, no hypotension.  Patient reassured, she can be discharged.  She was counseled not to take her normal morning meds today and then restart her normal regimen tonight.   Final Clinical Impressions(s) / ED Diagnoses   Final diagnoses:  Hypoglycemia  Accidental drug overdose, initial encounter    ED Discharge Orders    None       Iliyah Bui, Gwenyth Allegra, MD 07/21/18 0600    Orpah Greek, MD 07/21/18 [phone]

## 2018-07-21 NOTE — ED Triage Notes (Signed)
[facility] reports the pt accidentally overdosed on her cardizem. EMS reports the pt took 1 additional pill this evening accidentally. EMS reports pt was anxious upon their arrival but eventually calmed down. EMS also reports pt was hypertensive but her BP has come down. EMS started IV as noted. 12 lead was unremarkable with no ectopy. EMS reports pt is CAOx4.   Pt states she accidentally took 1 additional cardizem with her evening medication. Pt reports same was accidental in nature. Pt denies SI or HI.

## 2018-07-21 NOTE — Discharge Instructions (Addendum)
Do not take your morning medications today.  You may take your normal evening medications tonight and then restart your medicines as normal tomorrow.

## 2018-07-23 ENCOUNTER — Other Ambulatory Visit: Payer: Self-pay | Admitting: Physician Assistant

## 2018-07-27 DIAGNOSIS — Z794 Long term (current) use of insulin: Secondary | ICD-10-CM | POA: Diagnosis not present

## 2018-07-27 DIAGNOSIS — M85852 Other specified disorders of bone density and structure, left thigh: Secondary | ICD-10-CM | POA: Diagnosis not present

## 2018-07-27 DIAGNOSIS — Z5181 Encounter for therapeutic drug level monitoring: Secondary | ICD-10-CM | POA: Diagnosis not present

## 2018-07-27 DIAGNOSIS — E1165 Type 2 diabetes mellitus with hyperglycemia: Secondary | ICD-10-CM | POA: Diagnosis not present

## 2018-08-04 DIAGNOSIS — Z23 Encounter for immunization: Secondary | ICD-10-CM | POA: Diagnosis not present

## 2018-08-16 ENCOUNTER — Other Ambulatory Visit: Payer: Self-pay | Admitting: Internal Medicine

## 2018-08-16 DIAGNOSIS — M85852 Other specified disorders of bone density and structure, left thigh: Secondary | ICD-10-CM

## 2018-08-25 ENCOUNTER — Other Ambulatory Visit: Payer: Medicare Other

## 2018-08-25 ENCOUNTER — Ambulatory Visit
Admission: RE | Admit: 2018-08-25 | Discharge: 2018-08-25 | Disposition: A | Discharge status: Home / Self Care | Payer: Medicare Other | Source: Ambulatory Visit | Attending: Internal Medicine | Admitting: Internal Medicine

## 2018-08-25 DIAGNOSIS — M85851 Other specified disorders of bone density and structure, right thigh: Secondary | ICD-10-CM | POA: Diagnosis not present

## 2018-08-25 DIAGNOSIS — M85852 Other specified disorders of bone density and structure, left thigh: Secondary | ICD-10-CM

## 2018-08-25 DIAGNOSIS — Z780 Asymptomatic menopausal state: Secondary | ICD-10-CM | POA: Diagnosis not present

## 2018-10-27 NOTE — Telephone Encounter (Signed)
Patient called to see if she qualified for a cpap before her sleep study ran out. Patient was informed her sleep study showed that No significant obstructive sleep apnea occurred during this study (AHI = 3.7/h). Pt is aware and agreeable to normal results.

## 2018-10-31 DIAGNOSIS — M85852 Other specified disorders of bone density and structure, left thigh: Secondary | ICD-10-CM | POA: Diagnosis not present

## 2018-10-31 DIAGNOSIS — Z794 Long term (current) use of insulin: Secondary | ICD-10-CM | POA: Diagnosis not present

## 2018-10-31 DIAGNOSIS — Z79899 Other long term (current) drug therapy: Secondary | ICD-10-CM | POA: Diagnosis not present

## 2018-10-31 DIAGNOSIS — E1165 Type 2 diabetes mellitus with hyperglycemia: Secondary | ICD-10-CM | POA: Diagnosis not present

## 2018-11-02 DIAGNOSIS — M25561 Pain in right knee: Secondary | ICD-10-CM | POA: Diagnosis not present

## 2018-11-02 DIAGNOSIS — M25551 Pain in right hip: Secondary | ICD-10-CM | POA: Diagnosis not present

## 2018-11-02 DIAGNOSIS — S8391XA Sprain of unspecified site of right knee, initial encounter: Secondary | ICD-10-CM | POA: Diagnosis not present

## 2018-11-21 ENCOUNTER — Encounter (HOSPITAL_COMMUNITY): Payer: Self-pay | Admitting: Emergency Medicine

## 2018-11-21 ENCOUNTER — Other Ambulatory Visit: Payer: Self-pay

## 2018-11-21 ENCOUNTER — Other Ambulatory Visit: Payer: Self-pay | Admitting: Orthopedic Surgery

## 2018-11-21 ENCOUNTER — Encounter (HOSPITAL_COMMUNITY): Payer: Self-pay | Admitting: *Deleted

## 2018-11-21 ENCOUNTER — Emergency Department (HOSPITAL_COMMUNITY)
Admission: EM | Admit: 2018-11-21 | Discharge: 2018-11-21 | Disposition: A | Discharge status: Home / Self Care | Payer: Medicare Other | Source: Home / Self Care

## 2018-11-21 DIAGNOSIS — M79661 Pain in right lower leg: Secondary | ICD-10-CM | POA: Diagnosis not present

## 2018-11-21 DIAGNOSIS — Z5321 Procedure and treatment not carried out due to patient leaving prior to being seen by health care provider: Secondary | ICD-10-CM | POA: Insufficient documentation

## 2018-11-21 DIAGNOSIS — W19XXXA Unspecified fall, initial encounter: Secondary | ICD-10-CM | POA: Diagnosis not present

## 2018-11-21 DIAGNOSIS — I10 Essential (primary) hypertension: Secondary | ICD-10-CM | POA: Diagnosis not present

## 2018-11-21 DIAGNOSIS — M79604 Pain in right leg: Secondary | ICD-10-CM | POA: Insufficient documentation

## 2018-11-21 DIAGNOSIS — M25551 Pain in right hip: Secondary | ICD-10-CM

## 2018-11-21 DIAGNOSIS — M2550 Pain in unspecified joint: Secondary | ICD-10-CM | POA: Diagnosis not present

## 2018-11-21 DIAGNOSIS — S8991XA Unspecified injury of right lower leg, initial encounter: Secondary | ICD-10-CM | POA: Diagnosis not present

## 2018-11-21 DIAGNOSIS — Z7401 Bed confinement status: Secondary | ICD-10-CM | POA: Diagnosis not present

## 2018-11-21 DIAGNOSIS — M25561 Pain in right knee: Secondary | ICD-10-CM | POA: Diagnosis not present

## 2018-11-21 NOTE — ED Triage Notes (Signed)
Pt reports that she was called today and told to come her to be admitted and have her scans and blood work tonight for her surgery tomorrow.

## 2018-11-21 NOTE — ED Notes (Signed)
Dr Berenice Primas called and wanted to know why the patient was sent home, charge RN explained that she wasn't sent home she left without being seen. Dr Berenice Primas said he was going to set something up for her in short stay for the am prior to her surgery.

## 2018-11-21 NOTE — Progress Notes (Signed)
12/24/2017- noted in Epic-Sleep Study  12/14/2017- noted in [place]- office visit with Pamelia Hoit  11/09/2017-office visit Dr. Fransico Him  03/22/2017- noted in [place]- Cardiopulmonary exercise test  01/14/2016- noted in Epic-Stress test and ECHO

## 2018-11-21 NOTE — ED Notes (Signed)
Per Ortho MD, states patient has a right femoral neck fracture-surgery is scheduled for 2/12 @1445 -states she can no longer endure the pain-coming in for admission-Ortho MD wants a CT of right hip done when she arrives, he has already talked to the hospitalist and they are aware of her care plan

## 2018-11-22 ENCOUNTER — Encounter (HOSPITAL_COMMUNITY): Payer: Self-pay | Admitting: Anesthesiology

## 2018-11-22 ENCOUNTER — Ambulatory Visit (HOSPITAL_COMMUNITY): Admission: RE | Admit: 2018-11-22 | Payer: Medicare Other | Source: Home / Self Care | Admitting: Orthopedic Surgery

## 2018-11-22 HISTORY — DX: Unspecified osteoarthritis, unspecified site: M19.90

## 2018-11-22 SURGERY — ARTHROPLASTY, HIP, TOTAL, ANTERIOR APPROACH
Anesthesia: Spinal | Laterality: Right

## 2018-11-22 NOTE — Progress Notes (Signed)
Discussed patient with Dr Jillyn Hidden, Anesthesia for case. After reviewing case OK to have patient arrive directly to short stay.

## 2018-11-23 ENCOUNTER — Other Ambulatory Visit: Payer: Self-pay | Admitting: Orthopedic Surgery

## 2018-11-23 DIAGNOSIS — M25551 Pain in right hip: Secondary | ICD-10-CM

## 2018-11-24 ENCOUNTER — Inpatient Hospital Stay (HOSPITAL_COMMUNITY)
Admission: RE | Admit: 2018-11-24 | Discharge: 2018-11-26 | DRG: 470 | Disposition: A | Discharge status: Home Health | Payer: Medicare Other | Source: Ambulatory Visit | Attending: Family Medicine | Admitting: Family Medicine

## 2018-11-24 ENCOUNTER — Encounter (HOSPITAL_COMMUNITY)
Admission: RE | Disposition: A | Discharge status: Home Health | Payer: Self-pay | Source: Ambulatory Visit | Attending: Family Medicine

## 2018-11-24 ENCOUNTER — Encounter (HOSPITAL_COMMUNITY): Payer: Self-pay | Admitting: Anesthesiology

## 2018-11-24 ENCOUNTER — Inpatient Hospital Stay (HOSPITAL_COMMUNITY): Payer: Medicare Other | Admitting: Anesthesiology

## 2018-11-24 ENCOUNTER — Inpatient Hospital Stay (HOSPITAL_COMMUNITY): Payer: Medicare Other

## 2018-11-24 ENCOUNTER — Other Ambulatory Visit: Payer: Self-pay

## 2018-11-24 DIAGNOSIS — Z833 Family history of diabetes mellitus: Secondary | ICD-10-CM | POA: Diagnosis not present

## 2018-11-24 DIAGNOSIS — M1990 Unspecified osteoarthritis, unspecified site: Secondary | ICD-10-CM | POA: Diagnosis present

## 2018-11-24 DIAGNOSIS — Z8041 Family history of malignant neoplasm of ovary: Secondary | ICD-10-CM | POA: Diagnosis not present

## 2018-11-24 DIAGNOSIS — Z86718 Personal history of other venous thrombosis and embolism: Secondary | ICD-10-CM

## 2018-11-24 DIAGNOSIS — Z888 Allergy status to other drugs, medicaments and biological substances status: Secondary | ICD-10-CM

## 2018-11-24 DIAGNOSIS — I493 Ventricular premature depolarization: Secondary | ICD-10-CM | POA: Diagnosis present

## 2018-11-24 DIAGNOSIS — Z882 Allergy status to sulfonamides status: Secondary | ICD-10-CM

## 2018-11-24 DIAGNOSIS — Z8249 Family history of ischemic heart disease and other diseases of the circulatory system: Secondary | ICD-10-CM | POA: Diagnosis not present

## 2018-11-24 DIAGNOSIS — E785 Hyperlipidemia, unspecified: Secondary | ICD-10-CM | POA: Diagnosis present

## 2018-11-24 DIAGNOSIS — S8991XA Unspecified injury of right lower leg, initial encounter: Secondary | ICD-10-CM | POA: Diagnosis not present

## 2018-11-24 DIAGNOSIS — Z79899 Other long term (current) drug therapy: Secondary | ICD-10-CM

## 2018-11-24 DIAGNOSIS — I10 Essential (primary) hypertension: Secondary | ICD-10-CM | POA: Diagnosis present

## 2018-11-24 DIAGNOSIS — K219 Gastro-esophageal reflux disease without esophagitis: Secondary | ICD-10-CM | POA: Diagnosis present

## 2018-11-24 DIAGNOSIS — Z471 Aftercare following joint replacement surgery: Secondary | ICD-10-CM | POA: Diagnosis not present

## 2018-11-24 DIAGNOSIS — Z794 Long term (current) use of insulin: Secondary | ICD-10-CM | POA: Diagnosis not present

## 2018-11-24 DIAGNOSIS — W19XXXA Unspecified fall, initial encounter: Secondary | ICD-10-CM | POA: Diagnosis present

## 2018-11-24 DIAGNOSIS — Z6830 Body mass index (BMI) 30.0-30.9, adult: Secondary | ICD-10-CM

## 2018-11-24 DIAGNOSIS — G4733 Obstructive sleep apnea (adult) (pediatric): Secondary | ICD-10-CM | POA: Diagnosis present

## 2018-11-24 DIAGNOSIS — E782 Mixed hyperlipidemia: Secondary | ICD-10-CM | POA: Diagnosis not present

## 2018-11-24 DIAGNOSIS — E669 Obesity, unspecified: Secondary | ICD-10-CM | POA: Diagnosis present

## 2018-11-24 DIAGNOSIS — Z885 Allergy status to narcotic agent status: Secondary | ICD-10-CM

## 2018-11-24 DIAGNOSIS — S72001A Fracture of unspecified part of neck of right femur, initial encounter for closed fracture: Principal | ICD-10-CM | POA: Diagnosis present

## 2018-11-24 DIAGNOSIS — M25561 Pain in right knee: Secondary | ICD-10-CM

## 2018-11-24 DIAGNOSIS — Z86010 Personal history of colonic polyps: Secondary | ICD-10-CM

## 2018-11-24 DIAGNOSIS — Z419 Encounter for procedure for purposes other than remedying health state, unspecified: Secondary | ICD-10-CM

## 2018-11-24 DIAGNOSIS — Z7982 Long term (current) use of aspirin: Secondary | ICD-10-CM | POA: Diagnosis not present

## 2018-11-24 DIAGNOSIS — E119 Type 2 diabetes mellitus without complications: Secondary | ICD-10-CM | POA: Diagnosis present

## 2018-11-24 DIAGNOSIS — Z96649 Presence of unspecified artificial hip joint: Secondary | ICD-10-CM | POA: Diagnosis not present

## 2018-11-24 DIAGNOSIS — Z96641 Presence of right artificial hip joint: Secondary | ICD-10-CM | POA: Diagnosis not present

## 2018-11-24 DIAGNOSIS — Z01811 Encounter for preprocedural respiratory examination: Secondary | ICD-10-CM

## 2018-11-24 DIAGNOSIS — S72041A Displaced fracture of base of neck of right femur, initial encounter for closed fracture: Secondary | ICD-10-CM | POA: Diagnosis not present

## 2018-11-24 HISTORY — PX: TOTAL HIP ARTHROPLASTY: SHX124

## 2018-11-24 HISTORY — DX: Fracture of unspecified part of neck of right femur, initial encounter for closed fracture: S72.001A

## 2018-11-24 LAB — CBC WITH DIFFERENTIAL/PLATELET
Abs Immature Granulocytes: 0.04 10*3/uL (ref 0.00–0.07)
Basophils Absolute: 0.1 10*3/uL (ref 0.0–0.1)
Basophils Relative: 0 %
Eosinophils Absolute: 0 10*3/uL (ref 0.0–0.5)
Eosinophils Relative: 0 %
HCT: 41.4 % (ref 36.0–46.0)
Hemoglobin: 13 g/dL (ref 12.0–15.0)
Immature Granulocytes: 0 %
Lymphocytes Relative: 14 %
Lymphs Abs: 1.6 10*3/uL (ref 0.7–4.0)
MCH: 28.5 pg (ref 26.0–34.0)
MCHC: 31.4 g/dL (ref 30.0–36.0)
MCV: 90.8 fL (ref 80.0–100.0)
Monocytes Absolute: 1 10*3/uL (ref 0.1–1.0)
Monocytes Relative: 9 %
Neutro Abs: 8.9 10*3/uL — ABNORMAL HIGH (ref 1.7–7.7)
Neutrophils Relative %: 77 %
Platelets: 299 10*3/uL (ref 150–400)
RBC: 4.56 MIL/uL (ref 3.87–5.11)
RDW: 12 % (ref 11.5–15.5)
WBC: 11.6 10*3/uL — ABNORMAL HIGH (ref 4.0–10.5)
nRBC: 0 % (ref 0.0–0.2)

## 2018-11-24 LAB — COMPREHENSIVE METABOLIC PANEL
ALT: 18 U/L (ref 0–44)
AST: 18 U/L (ref 15–41)
Albumin: 4 g/dL (ref 3.5–5.0)
Alkaline Phosphatase: 118 U/L (ref 38–126)
Anion gap: 10 (ref 5–15)
BUN: 24 mg/dL — ABNORMAL HIGH (ref 8–23)
CO2: 28 mmol/L (ref 22–32)
Calcium: 9.6 mg/dL (ref 8.9–10.3)
Chloride: 99 mmol/L (ref 98–111)
Creatinine, Ser: 1.06 mg/dL — ABNORMAL HIGH (ref 0.44–1.00)
GFR calc Af Amer: >60 mL/min (ref >60)
GFR calc non Af Amer: 52 mL/min — ABNORMAL LOW (ref >60)
Glucose, Bld: 212 mg/dL — ABNORMAL HIGH (ref 70–99)
Potassium: 3.7 mmol/L (ref 3.5–5.1)
Sodium: 137 mmol/L (ref 135–145)
Total Bilirubin: 0.9 mg/dL (ref 0.3–1.2)
Total Protein: 7.7 g/dL (ref 6.5–8.1)

## 2018-11-24 LAB — GLUCOSE, CAPILLARY
Glucose-Capillary: 188 mg/dL — ABNORMAL HIGH (ref 70–99)
Glucose-Capillary: 166 mg/dL — ABNORMAL HIGH (ref 70–99)
Glucose-Capillary: 178 mg/dL — ABNORMAL HIGH (ref 70–99)
Glucose-Capillary: 242 mg/dL — ABNORMAL HIGH (ref 70–99)
Glucose-Capillary: 295 mg/dL — ABNORMAL HIGH (ref 70–99)

## 2018-11-24 LAB — TYPE AND SCREEN
ABO/RH(D): O POS
Antibody Screen: NEGATIVE

## 2018-11-24 LAB — PROTIME-INR
INR: 1.05
Prothrombin Time: 13.6 seconds (ref 11.4–15.2)

## 2018-11-24 LAB — APTT: aPTT: 34 seconds (ref 24–36)

## 2018-11-24 LAB — ABO/RH: ABO/RH(D): O POS

## 2018-11-24 SURGERY — ARTHROPLASTY, HIP, TOTAL, ANTERIOR APPROACH
Anesthesia: Monitor Anesthesia Care | Site: Hip | Laterality: Right

## 2018-11-24 MED ORDER — ONDANSETRON HCL 4 MG/2ML IJ SOLN: 4.0000 mg | Freq: Once | INTRAMUSCULAR | Status: DC | PRN

## 2018-11-24 MED ORDER — HYDROCODONE-ACETAMINOPHEN 5-325 MG PO TABS
1.0000 | ORAL_TABLET | ORAL | Status: DC | PRN
  Administered 2018-11-24 – 2018-11-25 (×2): 1 via ORAL
  Administered 2018-11-25: 2 via ORAL
  Filled 2018-11-24: qty 2
  Filled 2018-11-24 (×2): qty 1

## 2018-11-24 MED ORDER — MORPHINE SULFATE (PF) 2 MG/ML IV SOLN: 0.5000 mg | INTRAVENOUS | Status: DC | PRN

## 2018-11-24 MED ORDER — ALPRAZOLAM 0.25 MG PO TABS: 0.1250 mg | ORAL_TABLET | Freq: Every day | ORAL | Status: DC | PRN

## 2018-11-24 MED ORDER — METHOCARBAMOL 500 MG PO TABS
500.0000 mg | ORAL_TABLET | Freq: Four times a day (QID) | ORAL | Status: DC | PRN
  Administered 2018-11-25 – 2018-11-26 (×3): 500 mg via ORAL
  Filled 2018-11-24 (×3): qty 1

## 2018-11-24 MED ORDER — DOCUSATE SODIUM 100 MG PO CAPS: 100.0000 mg | ORAL_CAPSULE | Freq: Two times a day (BID) | ORAL | 0 refills | Status: DC

## 2018-11-24 MED ORDER — DILTIAZEM HCL ER COATED BEADS 180 MG PO CP24
360.0000 mg | ORAL_CAPSULE | Freq: Every day | ORAL | Status: DC
  Administered 2018-11-25 – 2018-11-26 (×2): 360 mg via ORAL
  Filled 2018-11-24 (×2): qty 2

## 2018-11-24 MED ORDER — PHENYLEPHRINE 40 MCG/ML (10ML) SYRINGE FOR IV PUSH (FOR BLOOD PRESSURE SUPPORT)
PREFILLED_SYRINGE | INTRAVENOUS | Status: DC | PRN
  Administered 2018-11-24 (×2): 80 ug via INTRAVENOUS

## 2018-11-24 MED ORDER — INSULIN ASPART 100 UNIT/ML ~~LOC~~ SOLN
32.0000 [IU] | Freq: Every day | SUBCUTANEOUS | Status: DC
  Administered 2018-11-24: 32 [IU] via SUBCUTANEOUS
  Administered 2018-11-25: 20 [IU] via SUBCUTANEOUS

## 2018-11-24 MED ORDER — INSULIN ASPART 100 UNIT/ML ~~LOC~~ SOLN
0.0000 [IU] | Freq: Three times a day (TID) | SUBCUTANEOUS | Status: DC
  Administered 2018-11-24 – 2018-11-25 (×2): 8 [IU] via SUBCUTANEOUS
  Administered 2018-11-25: 5 [IU] via SUBCUTANEOUS

## 2018-11-24 MED ORDER — HYDROCHLOROTHIAZIDE 25 MG PO TABS
25.0000 mg | ORAL_TABLET | Freq: Every day | ORAL | Status: DC
  Administered 2018-11-25 – 2018-11-26 (×2): 25 mg via ORAL
  Filled 2018-11-24 (×2): qty 1

## 2018-11-24 MED ORDER — DOCUSATE SODIUM 100 MG PO CAPS
100.0000 mg | ORAL_CAPSULE | Freq: Two times a day (BID) | ORAL | Status: DC
  Administered 2018-11-24 – 2018-11-26 (×4): 100 mg via ORAL
  Filled 2018-11-24 (×4): qty 1

## 2018-11-24 MED ORDER — WATER FOR IRRIGATION, STERILE IR SOLN
Status: DC | PRN
  Administered 2018-11-24: 1000 mL

## 2018-11-24 MED ORDER — ASPIRIN EC 325 MG PO TBEC
325.0000 mg | DELAYED_RELEASE_TABLET | Freq: Two times a day (BID) | ORAL | Status: DC
  Administered 2018-11-24 – 2018-11-26 (×4): 325 mg via ORAL
  Filled 2018-11-24 (×4): qty 1

## 2018-11-24 MED ORDER — INSULIN ASPART 100 UNIT/ML ~~LOC~~ SOLN: 30.0000 [IU] | SUBCUTANEOUS | Status: DC

## 2018-11-24 MED ORDER — ACETAMINOPHEN 650 MG RE SUPP: 650.0000 mg | Freq: Four times a day (QID) | RECTAL | Status: DC | PRN

## 2018-11-24 MED ORDER — POLYETHYLENE GLYCOL 3350 17 G PO PACK
17.0000 g | PACK | Freq: Every day | ORAL | Status: DC | PRN
  Administered 2018-11-25: 17 g via ORAL
  Filled 2018-11-24: qty 1

## 2018-11-24 MED ORDER — PHENYLEPHRINE 40 MCG/ML (10ML) SYRINGE FOR IV PUSH (FOR BLOOD PRESSURE SUPPORT)
PREFILLED_SYRINGE | INTRAVENOUS | Status: AC
  Filled 2018-11-24: qty 10

## 2018-11-24 MED ORDER — FENTANYL CITRATE (PF) 100 MCG/2ML IJ SOLN
INTRAMUSCULAR | Status: DC | PRN
  Administered 2018-11-24 (×2): 50 ug via INTRAVENOUS

## 2018-11-24 MED ORDER — DIPHENHYDRAMINE HCL 12.5 MG/5ML PO ELIX: 12.5000 mg | ORAL_SOLUTION | ORAL | Status: DC | PRN

## 2018-11-24 MED ORDER — KETAMINE HCL 10 MG/ML IJ SOLN
INTRAMUSCULAR | Status: DC | PRN
  Administered 2018-11-24: 20 mg via INTRAVENOUS

## 2018-11-24 MED ORDER — SODIUM CHLORIDE 0.9% FLUSH
INTRAVENOUS | Status: DC | PRN
  Administered 2018-11-24: 20 mL

## 2018-11-24 MED ORDER — SODIUM CHLORIDE 0.9 % IR SOLN
Status: DC | PRN
  Administered 2018-11-24: 1000 mL

## 2018-11-24 MED ORDER — PANTOPRAZOLE SODIUM 40 MG PO TBEC
40.0000 mg | DELAYED_RELEASE_TABLET | Freq: Every day | ORAL | Status: DC
  Administered 2018-11-25 – 2018-11-26 (×2): 40 mg via ORAL
  Filled 2018-11-24 (×2): qty 1

## 2018-11-24 MED ORDER — INSULIN ASPART 100 UNIT/ML ~~LOC~~ SOLN: 0.0000 [IU] | Freq: Three times a day (TID) | SUBCUTANEOUS | Status: DC

## 2018-11-24 MED ORDER — ALUM & MAG HYDROXIDE-SIMETH 200-200-20 MG/5ML PO SUSP: 30.0000 mL | ORAL | Status: DC | PRN

## 2018-11-24 MED ORDER — SPIRONOLACTONE 25 MG PO TABS
25.0000 mg | ORAL_TABLET | Freq: Every day | ORAL | Status: DC
  Administered 2018-11-25 – 2018-11-26 (×2): 25 mg via ORAL
  Filled 2018-11-24 (×2): qty 1

## 2018-11-24 MED ORDER — FENTANYL CITRATE (PF) 100 MCG/2ML IJ SOLN
INTRAMUSCULAR | Status: AC
  Filled 2018-11-24: qty 2

## 2018-11-24 MED ORDER — ONDANSETRON HCL 4 MG/2ML IJ SOLN
INTRAMUSCULAR | Status: DC | PRN
  Administered 2018-11-24: 4 mg via INTRAVENOUS

## 2018-11-24 MED ORDER — ACETAMINOPHEN 325 MG PO TABS
325.0000 mg | ORAL_TABLET | Freq: Four times a day (QID) | ORAL | Status: DC | PRN
  Administered 2018-11-25 – 2018-11-26 (×2): 650 mg via ORAL

## 2018-11-24 MED ORDER — BISACODYL 5 MG PO TBEC: 5.0000 mg | DELAYED_RELEASE_TABLET | Freq: Every day | ORAL | Status: DC | PRN

## 2018-11-24 MED ORDER — DEXAMETHASONE SODIUM PHOSPHATE 10 MG/ML IJ SOLN
10.0000 mg | Freq: Two times a day (BID) | INTRAMUSCULAR | Status: AC
  Administered 2018-11-24: 10 mg via INTRAVENOUS
  Filled 2018-11-24: qty 1

## 2018-11-24 MED ORDER — CHLORHEXIDINE GLUCONATE 4 % EX LIQD: 60.0000 mL | Freq: Once | CUTANEOUS | Status: DC

## 2018-11-24 MED ORDER — MIDAZOLAM HCL 5 MG/5ML IJ SOLN
INTRAMUSCULAR | Status: DC | PRN
  Administered 2018-11-24: 2 mg via INTRAVENOUS

## 2018-11-24 MED ORDER — PROPOFOL 500 MG/50ML IV EMUL
INTRAVENOUS | Status: DC | PRN
  Administered 2018-11-24: 75 ug/kg/min via INTRAVENOUS

## 2018-11-24 MED ORDER — VANCOMYCIN HCL IN DEXTROSE 1-5 GM/200ML-% IV SOLN
1000.0000 mg | INTRAVENOUS | Status: AC
  Administered 2018-11-24: 1000 mg via INTRAVENOUS
  Filled 2018-11-24: qty 200

## 2018-11-24 MED ORDER — BUPIVACAINE HCL (PF) 0.25 % IJ SOLN
INTRAMUSCULAR | Status: AC
  Filled 2018-11-24: qty 30

## 2018-11-24 MED ORDER — INSULIN ASPART 100 UNIT/ML ~~LOC~~ SOLN: 0.0000 [IU] | Freq: Every day | SUBCUTANEOUS | Status: DC

## 2018-11-24 MED ORDER — ONDANSETRON HCL 4 MG PO TABS: 4.0000 mg | ORAL_TABLET | Freq: Four times a day (QID) | ORAL | Status: DC | PRN

## 2018-11-24 MED ORDER — ACETAMINOPHEN 325 MG PO TABS
650.0000 mg | ORAL_TABLET | Freq: Four times a day (QID) | ORAL | Status: DC | PRN
  Filled 2018-11-24 (×2): qty 2

## 2018-11-24 MED ORDER — BUPIVACAINE IN DEXTROSE 0.75-8.25 % IT SOLN
INTRATHECAL | Status: DC | PRN
  Administered 2018-11-24: 2 mL via INTRATHECAL

## 2018-11-24 MED ORDER — TRANEXAMIC ACID-NACL 1000-0.7 MG/100ML-% IV SOLN
1000.0000 mg | INTRAVENOUS | Status: AC
  Administered 2018-11-24: 1000 mg via INTRAVENOUS
  Filled 2018-11-24: qty 100

## 2018-11-24 MED ORDER — SODIUM CHLORIDE 0.9 % IV SOLN
INTRAVENOUS | Status: DC | PRN
  Administered 2018-11-24: 20 ug/min via INTRAVENOUS

## 2018-11-24 MED ORDER — ONDANSETRON HCL 4 MG/2ML IJ SOLN
INTRAMUSCULAR | Status: AC
  Filled 2018-11-24: qty 2

## 2018-11-24 MED ORDER — DEXAMETHASONE SODIUM PHOSPHATE 10 MG/ML IJ SOLN
INTRAMUSCULAR | Status: AC
  Filled 2018-11-24: qty 1

## 2018-11-24 MED ORDER — PROPOFOL 10 MG/ML IV BOLUS
INTRAVENOUS | Status: AC
  Filled 2018-11-24: qty 40

## 2018-11-24 MED ORDER — GABAPENTIN 300 MG PO CAPS
300.0000 mg | ORAL_CAPSULE | Freq: Two times a day (BID) | ORAL | Status: DC
  Administered 2018-11-24 – 2018-11-26 (×4): 300 mg via ORAL
  Filled 2018-11-24 (×4): qty 1

## 2018-11-24 MED ORDER — DEXAMETHASONE SODIUM PHOSPHATE 10 MG/ML IJ SOLN
INTRAMUSCULAR | Status: DC | PRN
  Administered 2018-11-24: 10 mg via INTRAVENOUS

## 2018-11-24 MED ORDER — SODIUM CHLORIDE (PF) 0.9 % IJ SOLN
INTRAMUSCULAR | Status: AC
  Filled 2018-11-24: qty 20

## 2018-11-24 MED ORDER — LACTATED RINGERS IV SOLN: INTRAVENOUS | Status: DC

## 2018-11-24 MED ORDER — TRANEXAMIC ACID-NACL 1000-0.7 MG/100ML-% IV SOLN
1000.0000 mg | Freq: Once | INTRAVENOUS | Status: AC
  Administered 2018-11-24: 1000 mg via INTRAVENOUS
  Filled 2018-11-24: qty 100

## 2018-11-24 MED ORDER — LIP MEDEX EX OINT
TOPICAL_OINTMENT | CUTANEOUS | Status: AC
  Filled 2018-11-24: qty 7

## 2018-11-24 MED ORDER — OXYCODONE HCL 5 MG PO TABS: 5.0000 mg | ORAL_TABLET | ORAL | Status: DC | PRN

## 2018-11-24 MED ORDER — HYDROCODONE-ACETAMINOPHEN 5-325 MG PO TABS: 1.0000 | ORAL_TABLET | Freq: Four times a day (QID) | ORAL | 0 refills | Status: DC | PRN

## 2018-11-24 MED ORDER — ASPIRIN EC 325 MG PO TBEC: 325.0000 mg | DELAYED_RELEASE_TABLET | Freq: Two times a day (BID) | ORAL | 0 refills | Status: DC

## 2018-11-24 MED ORDER — PROPOFOL 10 MG/ML IV BOLUS
INTRAVENOUS | Status: AC
  Filled 2018-11-24: qty 20

## 2018-11-24 MED ORDER — BUPIVACAINE LIPOSOME 1.3 % IJ SUSP
INTRAMUSCULAR | Status: DC | PRN
  Administered 2018-11-24: 10 mL

## 2018-11-24 MED ORDER — MIDAZOLAM HCL 2 MG/2ML IJ SOLN
INTRAMUSCULAR | Status: AC
  Filled 2018-11-24: qty 2

## 2018-11-24 MED ORDER — METHOCARBAMOL 500 MG IVPB - SIMPLE MED
500.0000 mg | Freq: Four times a day (QID) | INTRAVENOUS | Status: DC | PRN
  Filled 2018-11-24: qty 50

## 2018-11-24 MED ORDER — TIZANIDINE HCL 2 MG PO TABS: 2.0000 mg | ORAL_TABLET | Freq: Three times a day (TID) | ORAL | 0 refills | Status: DC | PRN

## 2018-11-24 MED ORDER — LACTATED RINGERS IV SOLN
INTRAVENOUS | Status: DC
  Administered 2018-11-24 (×2): via INTRAVENOUS

## 2018-11-24 MED ORDER — PRAVASTATIN SODIUM 20 MG PO TABS
40.0000 mg | ORAL_TABLET | Freq: Every day | ORAL | Status: DC
  Administered 2018-11-25: 40 mg via ORAL
  Filled 2018-11-24: qty 2

## 2018-11-24 MED ORDER — PHENYLEPHRINE HCL 10 MG/ML IJ SOLN
INTRAMUSCULAR | Status: AC
  Filled 2018-11-24: qty 1

## 2018-11-24 MED ORDER — INSULIN ASPART 100 UNIT/ML ~~LOC~~ SOLN
30.0000 [IU] | Freq: Every day | SUBCUTANEOUS | Status: DC
  Administered 2018-11-25: 30 [IU] via SUBCUTANEOUS
  Administered 2018-11-26: 20 [IU] via SUBCUTANEOUS

## 2018-11-24 MED ORDER — MAGNESIUM CITRATE PO SOLN: 1.0000 | Freq: Once | ORAL | Status: DC | PRN

## 2018-11-24 MED ORDER — SODIUM CHLORIDE 0.9 % IV SOLN
INTRAVENOUS | Status: DC
  Administered 2018-11-24: 22:00:00 via INTRAVENOUS

## 2018-11-24 MED ORDER — FENTANYL CITRATE (PF) 100 MCG/2ML IJ SOLN: 25.0000 ug | INTRAMUSCULAR | Status: DC | PRN

## 2018-11-24 MED ORDER — CEFAZOLIN SODIUM-DEXTROSE 2-4 GM/100ML-% IV SOLN
2.0000 g | Freq: Four times a day (QID) | INTRAVENOUS | Status: AC
  Administered 2018-11-24 – 2018-11-25 (×2): 2 g via INTRAVENOUS
  Filled 2018-11-24 (×2): qty 100

## 2018-11-24 MED ORDER — LIP MEDEX EX OINT: 1.0000 "application " | TOPICAL_OINTMENT | CUTANEOUS | Status: DC | PRN

## 2018-11-24 MED ORDER — ONDANSETRON HCL 4 MG/2ML IJ SOLN: 4.0000 mg | Freq: Four times a day (QID) | INTRAMUSCULAR | Status: DC | PRN

## 2018-11-24 MED ORDER — BUPIVACAINE LIPOSOME 1.3 % IJ SUSP
10.0000 mL | Freq: Once | INTRAMUSCULAR | Status: DC
  Filled 2018-11-24: qty 10

## 2018-11-24 MED ORDER — METHOCARBAMOL 500 MG IVPB - SIMPLE MED
INTRAVENOUS | Status: AC
  Filled 2018-11-24: qty 50

## 2018-11-24 MED ORDER — BUPIVACAINE HCL (PF) 0.25 % IJ SOLN
INTRAMUSCULAR | Status: DC | PRN
  Administered 2018-11-24: 30 mL

## 2018-11-24 SURGICAL SUPPLY — 36 items
BAG ZIPLOCK 12X15 (MISCELLANEOUS) IMPLANT
BENZOIN TINCTURE PRP APPL 2/3 (GAUZE/BANDAGES/DRESSINGS) ×2 IMPLANT
BLADE SAW SGTL 18X1.27X75 (BLADE) ×2 IMPLANT
BLADE SURG SZ10 CARB STEEL (BLADE) ×4 IMPLANT
COVER PERINEAL POST (MISCELLANEOUS) ×2 IMPLANT
COVER SURGICAL LIGHT HANDLE (MISCELLANEOUS) ×2 IMPLANT
COVER WAND RF STERILE (DRAPES) IMPLANT
DRAPE STERI IOBAN 125X83 (DRAPES) ×2 IMPLANT
DRAPE U-SHAPE 47X51 STRL (DRAPES) ×4 IMPLANT
DRSG AQUACEL AG ADV 3.5X 6 (GAUZE/BANDAGES/DRESSINGS) ×2 IMPLANT
DURAPREP 26ML APPLICATOR (WOUND CARE) ×2 IMPLANT
ELECT REM PT RETURN 15FT ADLT (MISCELLANEOUS) ×2 IMPLANT
ELIMINATOR HOLE APEX DEPUY (Hips) ×2 IMPLANT
GAUZE XEROFORM 1X8 LF (GAUZE/BANDAGES/DRESSINGS) IMPLANT
GLOVE BIOGEL PI IND STRL 8 (GLOVE) ×2 IMPLANT
GLOVE BIOGEL PI INDICATOR 8 (GLOVE) ×2
GLOVE ECLIPSE 7.5 STRL STRAW (GLOVE) ×4 IMPLANT
GOWN STRL REUS W/TWL XL LVL3 (GOWN DISPOSABLE) ×4 IMPLANT
HEAD FEM STD 32X+1 STRL (Hips) ×2 IMPLANT
HOLDER FOLEY CATH W/STRAP (MISCELLANEOUS) ×2 IMPLANT
HOOD PEEL AWAY FLYTE STAYCOOL (MISCELLANEOUS) ×4 IMPLANT
LINER ACET PNNCL PLUS4 NEUTRAL (Hips) ×1 IMPLANT
LINER PINN ACET GRIP 50X100 ×2 IMPLANT
NEEDLE HYPO 22GX1.5 SAFETY (NEEDLE) ×2 IMPLANT
PACK ANTERIOR HIP CUSTOM (KITS) ×2 IMPLANT
PINNACLE PLUS 4 NEUTRAL (Hips) ×2 IMPLANT
STEM CORAIL KLA15 (Stem) ×2 IMPLANT
STRIP CLOSURE SKIN 1/2X4 (GAUZE/BANDAGES/DRESSINGS) ×2 IMPLANT
SUT ETHIBOND NAB CT1 #1 30IN (SUTURE) ×4 IMPLANT
SUT MNCRL AB 3-0 PS2 18 (SUTURE) ×2 IMPLANT
SUT VIC AB 0 CT1 36 (SUTURE) ×4 IMPLANT
SUT VIC AB 1 CT1 36 (SUTURE) ×2 IMPLANT
SUT VIC AB 2-0 CT1 27 (SUTURE) ×1
SUT VIC AB 2-0 CT1 TAPERPNT 27 (SUTURE) ×1 IMPLANT
TRAY FOLEY MTR SLVR 16FR STAT (SET/KITS/TRAYS/PACK) ×2 IMPLANT
YANKAUER SUCT BULB TIP 10FT TU (MISCELLANEOUS) ×2 IMPLANT

## 2018-11-24 NOTE — Brief Op Note (Signed)
11/24/2018  1:51 PM  PATIENT:  Alice Cole  73 y.o. female  PRE-OPERATIVE DIAGNOSIS:  RIGHT HIP FEMORAL NECK FRACTURE  POST-OPERATIVE DIAGNOSIS:  RIGHT HIP FEMORAL NECK FRACTURE  PROCEDURE:  Procedure(s): TOTAL HIP ARTHROPLASTY ANTERIOR APPROACH (Right)  SURGEON:  Surgeon(s) and Role:    Dorna Leitz, MD - Primary  PHYSICIAN ASSISTANT:   ASSISTANTS: bethune   ANESTHESIA:   spinal  EBL:  250 mL   BLOOD ADMINISTERED:none  DRAINS: none   LOCAL MEDICATIONS USED:  MARCAINE    and OTHER experel  SPECIMEN:  No Specimen  DISPOSITION OF SPECIMEN:  N/A  COUNTS:  YES  TOURNIQUET:  * No tourniquets in log *  DICTATION: .Other Dictation: Dictation Number [id number]  PLAN OF CARE: Admit to inpatient   PATIENT DISPOSITION:  PACU - hemodynamically stable.   Delay start of Pharmacological VTE agent (>24hrs) due to surgical blood loss or risk of bleeding: no

## 2018-11-24 NOTE — Discharge Instructions (Signed)

## 2018-11-24 NOTE — Op Note (Signed)
NAMEBRITTNE, Cole MEDICAL RECORD [id number] ACCOUNT [id number] DATE OF BIRTH:1946/01/22 FACILITY: [facility] LOCATION: [facility] PHYSICIAN:Hadia Minier L. Alleene Stoy, MD  OPERATIVE REPORT  DATE OF PROCEDURE:  11/24/2018  PREOPERATIVE DIAGNOSIS:  Femoral neck fracture with displacement, right, in a highly active, mobile woman.  POSTOPERATIVE DIAGNOSIS:  Femoral neck fracture with displacement, right, in a highly active, mobile woman.  PROCEDURE: 1.  Right total hip replacement with a Corail stem size 15 high offset, 50 mm Gription Pinnacle cup, 32 mm +4 neutral liner, and a +1 metal ball 32 mm. 2.  Interpretation of multiple intraoperative fluoroscopic images.  SURGEON:  Dorna Leitz, MD  ASSISTANT:  Gaspar Skeeters PA-C, was present for the entire case and assisted with retraction, bone cuts, and closing to minimize OR time.  BRIEF HISTORY:  The patient is a 73 year old female with a history of having fallen a couple of weeks ago.  She initially was having significant pain in the right leg and knee.  We saw her in the office and took x-rays which were normal.  She came back  for followup x-rays which unfortunately showed what I suspected was a femoral neck fracture.  We admitted her to the hospital, got a CAT scan which confirmed the diagnosis.  She was taken to the operating room for right total hip replacement because of  her extremely active lifestyle and expected longevity.  DESCRIPTION OF PROCEDURE:  The patient was taken to the operating room after adequate anesthesia obtained with a spinal anesthetic, the patient was placed supine on the operating table onto the [place] bed.  All bony prominences well padded.  Attention  turned to the right hip where after routine prep and drape and incision was made for an anterior approach to the hip subcutaneous tissue down to the tensor fascia, which was divided in line with its fibers.  Retractor put in place above and below the  neck.  Anterior capsule was opened  and tagged and following this, a provisional neck cut was made followed by removal of the head.  It measured a 44 on the back table.  We then sequentially reamed the acetabulum up to a level of 49 mm and a 50 mm  Gription cup was hammered into place, 45 degrees of lateral opening, 30 degrees of anteversion.  Following this, attention was turned to the hip towards the stem side where the hip was externally rotated, extended and adducted.  It was sequentially  rasped up to a level of 15 and a trial reduction was undertaken.  A touch long at that point.  I asked for the high offset stem to give [place] some decrease in length.  Unfortunately, it only had the high offset stem, which did not change the length, so we  used the stem and this gave [place] a very nice offset and very nice stability.  We were still a few millimeters long, but it kind of matched up to her preoperative images.  At this point, we irrigated the hip thoroughly and suctioned it dry.  The final 15  was then opened and placed with a +0 ball 32 mm high offset and following this, the hip was reduced.  Anatomic reduction was achieved.  Final images were taken.  At this point, the patient was irrigated, suctioned dry.  We closed the capsule with 1  Vicryl running, closed the tensor with 0 Vicryl running, closed the skin with 0 and 2-0 Vicryl and 3-0 Monocryl subcuticular.  Benzoin, Steri-Strips, dry sterile compressive dressing  was applied and the patient was taken to recovery was noted to be in  satisfactory condition.  Estimated blood loss for procedure was 250 mL.  TN/NUANCE  D:11/24/2018 T:11/24/2018 JOB:[id number]

## 2018-11-24 NOTE — Anesthesia Procedure Notes (Signed)
Date/Time: 11/24/2018 12:19 PM Performed by: Sharlette Dense, CRNA Oxygen Delivery Method: Simple face mask

## 2018-11-24 NOTE — Evaluation (Signed)
Physical Therapy Evaluation Patient Details Name: Alice Cole MRN: [id number] DOB: July 22, 1946 Today's Date: 11/24/2018   History of Present Illness  73 yo female s/p R DA-THA on 11/24/18. Pt with mechanical fall 3 wks ago resulting in femoral neck fracture. PMH includes DVT, DMII, GERD, HLD, HTN, OSA, PVCs, diverticulitis, L and R knee surgery.   Clinical Impression   Pt presents with difficulty performing bed mobility, suspected post-surgical R hip weakness, increased time and effort to perform mobility tasks, and decreased activity tolerance post-surgery. Pt to benefit from acute PT to address deficits. Pt ambulated 81 ft with RW with min guard assist, VC provided throughout. Pt reporting mild dizziness with ambulation, pt reporting due to pain meds and highly motivated to keep ambulating. No dizziness upon return to room. Pt educated on ankle pumps (20/hour) to perform this afternoon/evening to increase circulation, to pt's tolerance and limited by pain. PT to progress mobility as tolerated, and will continue to follow acutely.        Follow Up Recommendations Follow surgeon's recommendation for DC plan and follow-up therapies;Supervision for mobility/OOB    Equipment Recommendations  Rolling walker with 5" wheels;3in1 (PT)    Recommendations for Other Services       Precautions / Restrictions Precautions Precautions: Fall Restrictions Weight Bearing Restrictions: No Other Position/Activity Restrictions: WBAT       Mobility  Bed Mobility Overal bed mobility: Needs Assistance Bed Mobility: Supine to Sit     Supine to sit: Min assist;HOB elevated     General bed mobility comments: Min assist for RLE lift assist. Increased time.   Transfers Overall transfer level: Needs assistance Equipment used: Rolling walker (2 wheeled) Transfers: Sit to/from Stand Sit to Stand: Min guard;From elevated surface         General transfer comment: min guard for safety. Verbal cuing for  hand placement.   Ambulation/Gait Ambulation/Gait assistance: Min guard Gait Distance (Feet): 45 Feet Assistive device: Rolling walker (2 wheeled) Gait Pattern/deviations: Step-to pattern;Decreased stance time - right;Decreased weight shift to right;Antalgic Gait velocity: decr    General Gait Details: Min guard for safety, verbal cuing for placement in RW, sequencing. Pt with little WB on RLE, but complaining of no pain with ambulation. R knee brace not applied for amb, pt with no knee difficulties or pain with amb.   Stairs            Wheelchair Mobility    Modified Rankin (Stroke Patients Only)       Balance Overall balance assessment: Mild deficits observed, not formally tested                                           Pertinent Vitals/Pain Pain Assessment: No/denies pain    Home Living Family/patient expects to be discharged to:: Private residence Living Arrangements: Spouse/significant other;Other relatives;Children Available Help at Discharge: Family;Available 24 hours/day Type of Home: House Home Access: Stairs to enter Entrance Stairs-Rails: None Entrance Stairs-Number of Steps: 3 Home Layout: One level Home Equipment: Crutches;Cane - single point      Prior Function Level of Independence: Independent with assistive device(s)         Comments: prior to admission, pt using crutches for mobility and using R knee brace      Hand Dominance        Extremity/Trunk Assessment   Upper Extremity Assessment Upper Extremity Assessment: Overall  WFL for tasks assessed    Lower Extremity Assessment Lower Extremity Assessment: Overall WFL for tasks assessed;RLE deficits/detail RLE Deficits / Details: suspected post-surgical hip weakness; able to perform ankle pumps, quad set, SLR with no quad lag, heel slide  RLE Sensation: WNL    Cervical / Trunk Assessment Cervical / Trunk Assessment: Normal  Communication   Communication: No  difficulties  Cognition Arousal/Alertness: Awake/alert Behavior During Therapy: WFL for tasks assessed/performed Overall Cognitive Status: Within Functional Limits for tasks assessed                                        General Comments      Exercises     Assessment/Plan    PT Assessment Patient needs continued PT services  PT Problem List Decreased strength;Pain;Decreased activity tolerance;Decreased knowledge of use of DME;Decreased balance;Decreased mobility       PT Treatment Interventions DME instruction;Therapeutic activities;Gait training;Therapeutic exercise;Patient/family education;Stair training;Balance training;Functional mobility training    PT Goals (Current goals can be found in the Care Plan section)  Acute Rehab PT Goals PT Goal Formulation: With patient Time For Goal Achievement: 12/01/18 Potential to Achieve Goals: Good    Frequency Min 5X/week   Barriers to discharge        Co-evaluation               AM-PAC PT "6 Clicks" Mobility  Outcome Measure Help needed turning from your back to your side while in a flat bed without using bedrails?: A Little Help needed moving from lying on your back to sitting on the side of a flat bed without using bedrails?: A Little Help needed moving to and from a bed to a chair (including a wheelchair)?: A Little Help needed standing up from a chair using your arms (e.g., wheelchair or bedside chair)?: A Little Help needed to walk in hospital room?: A Little Help needed climbing 3-5 steps with a railing? : A Little 6 Click Score: 18    End of Session Equipment Utilized During Treatment: Gait belt Activity Tolerance: Patient tolerated treatment well Patient left: in chair;with chair alarm set;with call bell/phone within reach;with SCD's reapplied Nurse Communication: Mobility status PT Visit Diagnosis: Other abnormalities of gait and mobility (R26.89);Difficulty in walking, not elsewhere  classified (R26.2)    Time: 5784-6962 PT Time Calculation (min) (ACUTE ONLY): 24 min   Charges:   PT Evaluation $PT Eval Low Complexity: 1 Low PT Treatments $Gait Training: 8-22 mins       Julien Girt, PT Acute Rehabilitation Services Pager [phone]  Office [phone]  Roxine Caddy D Elonda Husky 11/24/2018, 7:46 PM

## 2018-11-24 NOTE — Anesthesia Postprocedure Evaluation (Signed)
Anesthesia Post Note  Patient: Alice Cole  Procedure(s) Performed: TOTAL HIP ARTHROPLASTY ANTERIOR APPROACH (Right Hip)     Patient location during evaluation: PACU Anesthesia Type: Spinal Pain management: pain level controlled Vital Signs Assessment: post-procedure vital signs reviewed and stable Respiratory status: spontaneous breathing Cardiovascular status: stable Postop Assessment: no headache, no backache, spinal receding and no apparent nausea or vomiting Anesthetic complications: no    Last Vitals:  Vitals:   11/24/18 1500 11/24/18 1515  BP: (!) 141/59 (!) 131/57  Pulse: 81 73  Resp: (!) 23 (!) 21  Temp:    SpO2: 100% 100%    Last Pain:  Vitals:   11/24/18 1515  TempSrc:   PainSc: 0-No pain   Pain Goal:    LLE Motor Response: Responds to commands (11/24/18 1515) LLE Sensation: Numbness (11/24/18 1515) RLE Motor Response: Responds to commands (11/24/18 1515) RLE Sensation: Numbness (11/24/18 1515) L Sensory Level: L4-Anterior knee, lower leg (11/24/18 1515) R Sensory Level: L4-Anterior knee, lower leg (11/24/18 1515) Epidural/Spinal Function Cutaneous sensation: Vague (11/24/18 1515), Patient able to flex knees: No (11/24/18 1515), Patient able to lift hips off bed: No (11/24/18 1515)  Huston Foley

## 2018-11-24 NOTE — Consult Note (Signed)
Reason for Consult: Right hip and lower extremity pain Referring Physician: Hospitalist  Alice Cole is an 73 y.o. female.  HPI: The patient is a 73 year old female who presented to our office on Tuesday with right lower extremity pain.  X-rays showed what I suspected was a femoral neck fracture.  We made plans for her to come to the hospital.  She came by ambulance and after some issues with communication she ultimately went home on her own.  She continued to have severe pain and inability stand or walk.  We continue to make efforts to try to get her back to the hospital and ultimately got her into the hospital this morning.  She underwent CT scanning which shows a displaced femoral neck fracture which I suspect is what she had.  She was evaluated admitted by the hospitalist because of femoral neck fracture.  Giving her young age and high activity level I think total hip replacement is the appropriate course of action and she is brought into the operating room for this procedure.  Past Medical History:  Diagnosis Date  . Abnormal EKG 07/09/2016  . Abnormal liver function   . Adenomatous colon polyp   . Arthritis   . Bigeminy   . Colonic polyp   . Dermatitis   . Diabetes mellitus   . Diverticulitis    recurrent  . DVT (deep venous thrombosis) ([place])   . Esophageal stricture   . GERD (gastroesophageal reflux disease)   . Hemorrhoids   . Hyperlipidemia   . Hypertension   . OSA (obstructive sleep apnea) 07/09/2016   She has not been using her CPAP device because she needs new supplies  . PVC's (premature ventricular contractions)     Past Surgical History:  Procedure Laterality Date  . ABDOMINAL HYSTERECTOMY    . CHOLECYSTECTOMY     aprox 10 years ago  . EYE SURGERY     right and left eye  . KNEE SURGERY     Right  . KNEE SURGERY     Left    Family History  Problem Relation Age of Onset  . Heart attack Mother 75  . Heart attack Brother 38  . Heart attack Maternal Grandmother  67  . Heart attack Maternal Grandfather 63  . Cancer Daughter        Ovarian Cancer  . Diabetes Other        Grandparent  . Heart disease Other        Grandparent    Social History:  reports that she has never smoked. She has never used smokeless tobacco. She reports that she does not drink alcohol or use drugs.  Allergies:  Allergies  Allergen Reactions  . Metoprolol Anaphylaxis  . Nortriptyline Anaphylaxis  . Olmesartan Anaphylaxis  . Tradjenta [Linagliptin] Shortness Of Breath  . Alatrofloxacin Dermatitis  . Beta Adrenergic Blockers Other (See Comments)    Reaction: Low Heart Rate  . Cephalexin Diarrhea and Nausea And Vomiting  . Codeine Other (See Comments)    tachycardia  . Crestor [Rosuvastatin Calcium] Other (See Comments)    Leg cramps  . Flagyl [Metronidazole] Nausea Only  . Fluvastatin Sodium Itching  . Levemir [Insulin Detemir] Itching and Other (See Comments)    Bad mood  . Lisinopril Nausea Only  . Sulfamethoxazole-Trimethoprim Nausea Only and Other (See Comments)    REACTION: nausea, irreg. heartrate  . Verapamil Swelling and Other (See Comments)    Chest pain  . Welchol [Colesevelam Hcl] Nausea And Vomiting  .  Adhesive [Tape] Rash    Band Aids  . Sitagliptin Rash  . Trovan [Trovafloxacin] Rash and Other (See Comments)    Medications: I have reviewed the patient's current medications.  Results for orders placed or performed during the hospital encounter of 11/24/18 (from the past 48 hour(s))  Glucose, capillary     Status: Abnormal   Collection Time: 11/24/18  9:52 AM  Result Value Ref Range   Glucose-Capillary 188 (H) 70 - 99 mg/dL  Comprehensive metabolic panel     Status: Abnormal   Collection Time: 11/24/18 10:05 AM  Result Value Ref Range   Sodium 137 135 - 145 mmol/L   Potassium 3.7 3.5 - 5.1 mmol/L   Chloride 99 98 - 111 mmol/L   CO2 28 22 - 32 mmol/L   Glucose, Bld 212 (H) 70 - 99 mg/dL   BUN 24 (H) 8 - 23 mg/dL   Creatinine, Ser 1.06  (H) 0.44 - 1.00 mg/dL   Calcium 9.6 8.9 - 10.3 mg/dL   Total Protein 7.7 6.5 - 8.1 g/dL   Albumin 4.0 3.5 - 5.0 g/dL   AST 18 15 - 41 U/L   ALT 18 0 - 44 U/L   Alkaline Phosphatase 118 38 - 126 U/L   Total Bilirubin 0.9 0.3 - 1.2 mg/dL   GFR calc non Af Amer 52 (L) >60 mL/min   GFR calc Af Amer >60 >60 mL/min   Anion gap 10 5 - 15    Comment: Performed at [facility], [place] [address], [place], [place] [address]  Type and screen [place]     Status: None   Collection Time: 11/24/18 10:05 AM  Result Value Ref Range   ABO/RH(D) O POS    Antibody Screen NEG    Sample Expiration      11/27/2018 Performed at [facility], [place] [address], [place], [place] [address]   Protime-INR     Status: None   Collection Time: 11/24/18 10:05 AM  Result Value Ref Range   Prothrombin Time 13.6 11.4 - 15.2 seconds   INR 1.05     Comment: Performed at [facility], [place] [address], [place], [place] [address]  APTT     Status: None   Collection Time: 11/24/18 10:05 AM  Result Value Ref Range   aPTT 34 24 - 36 seconds    Comment: Performed at [facility], [place] [address] [place], [place] [address]  CBC WITH DIFFERENTIAL     Status: Abnormal   Collection Time: 11/24/18 10:05 AM  Result Value Ref Range   WBC 11.6 (H) 4.0 - 10.5 K/uL   RBC 4.56 3.87 - 5.11 MIL/uL   Hemoglobin 13.0 12.0 - 15.0 g/dL   HCT 41.4 36.0 - 46.0 %   MCV 90.8 80.0 - 100.0 fL   MCH 28.5 26.0 - 34.0 pg   MCHC 31.4 30.0 - 36.0 g/dL   RDW 12.0 11.5 - 15.5 %   Platelets 299 150 - 400 K/uL   nRBC 0.0 0.0 - 0.2 %   Neutrophils Relative % 77 %   Neutro Abs 8.9 (H) 1.7 - 7.7 K/uL   Lymphocytes Relative 14 %   Lymphs Abs 1.6 0.7 - 4.0 K/uL   Monocytes Relative 9 %   Monocytes Absolute 1.0 0.1 - 1.0 K/uL   Eosinophils Relative 0 %   Eosinophils Absolute 0.0 0.0 - 0.5 K/uL   Basophils Relative 0 %  Basophils Absolute 0.1 0.0 - 0.1  K/uL   Immature Granulocytes 0 %   Abs Immature Granulocytes 0.04 0.00 - 0.07 K/uL    Comment: Performed at [facility], [place] [address], [place], [place] [address]  ABO/Rh     Status: None (Preliminary result)   Collection Time: 11/24/18 10:05 AM  Result Value Ref Range   ABO/RH(D)      O POS Performed at [facility], [place] [address], [place], [place] [address]   Glucose, capillary     Status: Abnormal   Collection Time: 11/24/18 11:37 AM  Result Value Ref Range   Glucose-Capillary 166 (H) 70 - 99 mg/dL    No results found.  ROS  ROS: I have reviewed the patient's review of systems thoroughly and there are no positive responses as relates to the HPI. Blood pressure 101/68, pulse 97, temperature 97.6 F (36.4 C), temperature source Oral, resp. rate 16, SpO2 100 %. Physical Exam Well-developed well-nourished patient in no acute distress. Alert and oriented x3 HEENT:within normal limits Cardiac: Regular rate and rhythm Pulmonary: Lungs clear to auscultation Abdomen: Soft and nontender.  Normal active bowel sounds  Musculoskeletal: (Right hip: Severe pain with range of motion.  Positive heel strike.  Neurovascular intact distally.  Skin is in good repair over the anterior groin area. Assessment/Plan: 73 year old female with a history of right lower extremity and hip pain who ultimately has been evaluated and definitively found to have displaced femoral neck fracture right side.  She is been admitted by the hospitalist service and feels that she is safe for surgical intervention.  She will be taken to the operating room for right total hip replacement based on her high activity level prior to her fall.  The patient is well aware of the risks and benefits of surgery.  She is well aware of the risk of bleeding, infection, need for further surgery, dislocation, and a slight risk of death and and around the time of surgery.  She and her family are well  aware of these risks and wished to proceed.  Alta Corning 11/24/2018, 11:56 AM

## 2018-11-24 NOTE — H&P (Signed)
History and Physical    PREEYA CLECKLEY ONG:[id number] DOB: 09/27/1946 DOA: 11/24/2018  PCP: Maurice Small, MD  Patient coming from: home  I have personally briefly reviewed patient's old medical records in [place]  Chief Complaint: r hip pain  HPI: KARA MELCHING is Swara Donze 73 y.o. female with medical history significant of type 2 diabetes, hypertension, hyperlipidemia, OSA, PVCs, history of DVT multiple medical problems presenting after Camryn Lampson fall at home about 3 weeks ago.   She notes that about 3 weeks ago she walked out of the back door and onto her deck.  She fell.  She does not know what it was that caused her to fall.  She denies any lightheadedness, dizziness, loss of consciousness or head trauma.  She hit her right leg and her knee when she fell.  After the fall she says that she was going around with crutches.  She had significant pain in her right leg.  She saw the orthopedic doctor earlier this week.  After the physical exam she feels like she is had more difficulty getting around and has not been able to get out of bed since Tuesday.  Has any fevers, chills, chest pain, shortness of breath, abdominal pain, nausea or vomiting.  She notes being really active before her fall.  She says that she can walk at least Gershon Shorten block and also Reham Slabaugh flight of stairs.  She saw Dr. Berenice Primas on Tuesday of this week who got imaging and was concern for Angelynn Lemus femoral neck fracture.  He initially plan for her to go to the ED for imaging and to be admitted but there was Camara Renstrom miscommunication and she was discharged from the ED.  She was direct admitted to the hospitalist service and there is plan for surgery today.    Review of Systems: As per HPI otherwise 10 point review of systems negative.   Past Medical History:  Diagnosis Date  . Abnormal EKG 07/09/2016  . Abnormal liver function   . Adenomatous colon polyp   . Arthritis   . Bigeminy   . Colonic polyp   . Dermatitis   . Diabetes mellitus   . Diverticulitis    recurrent   . DVT (deep venous thrombosis) ([place])   . Esophageal stricture   . GERD (gastroesophageal reflux disease)   . Hemorrhoids   . Hyperlipidemia   . Hypertension   . OSA (obstructive sleep apnea) 07/09/2016   She has not been using her CPAP device because she needs new supplies  . PVC's (premature ventricular contractions)     Past Surgical History:  Procedure Laterality Date  . ABDOMINAL HYSTERECTOMY    . CHOLECYSTECTOMY     aprox 10 years ago  . EYE SURGERY     right and left eye  . KNEE SURGERY     Right  . KNEE SURGERY     Left     reports that she has never smoked. She has never used smokeless tobacco. She reports that she does not drink alcohol or use drugs.  Allergies  Allergen Reactions  . Metoprolol Anaphylaxis  . Nortriptyline Anaphylaxis  . Olmesartan Anaphylaxis  . Tradjenta [Linagliptin] Shortness Of Breath  . Alatrofloxacin Dermatitis  . Beta Adrenergic Blockers Other (See Comments)    Reaction: Low Heart Rate  . Cephalexin Diarrhea and Nausea And Vomiting  . Codeine Other (See Comments)    tachycardia  . Crestor [Rosuvastatin Calcium] Other (See Comments)    Leg cramps  . Flagyl [  Metronidazole] Nausea Only  . Fluvastatin Sodium Itching  . Levemir [Insulin Detemir] Itching and Other (See Comments)    Bad mood  . Lisinopril Nausea Only  . Sulfamethoxazole-Trimethoprim Nausea Only and Other (See Comments)    REACTION: nausea, irreg. heartrate  . Verapamil Swelling and Other (See Comments)    Chest pain  . Welchol [Colesevelam Hcl] Nausea And Vomiting  . Adhesive [Tape] Rash    Band Aids  . Sitagliptin Rash  . Trovan [Trovafloxacin] Rash and Other (See Comments)    Family History  Problem Relation Age of Onset  . Heart attack Mother 71  . Heart attack Brother 48  . Heart attack Maternal Grandmother 67  . Heart attack Maternal Grandfather 63  . Cancer Daughter        Ovarian Cancer  . Diabetes Other        Grandparent  . Heart disease Other         Grandparent    Prior to Admission medications   Medication Sig Start Date End Date Taking? Authorizing Provider  acetaminophen (TYLENOL) 500 MG tablet Take 1,000 mg by mouth every 6 (six) hours as needed for moderate pain or headache.   Yes [provider]  ALPRAZolam (XANAX) 0.25 MG tablet Take 0.125-0.25 mg by mouth daily as needed for anxiety.   Yes [provider]  aspirin EC 81 MG tablet Take 1 tablet (81 mg total) by mouth daily. 09/05/17  Yes Imogene Burn, PA-C  cephALEXin (KEFLEX) 500 MG capsule Take 500 mg by mouth 2 (two) times daily.  11/15/18  Yes [provider]  diltiazem (CARDIZEM CD) 360 MG 24 hr capsule TAKE 1 CAPSULE (360 MG TOTAL) BY MOUTH DAILY. 07/24/18  Yes Imogene Burn, PA-C  hydrochlorothiazide (HYDRODIURIL) 25 MG tablet Take 25 mg by mouth daily.   Yes [provider]  insulin aspart (NOVOLOG) 100 UNIT/ML injection Inject 42-44 Units into the skin See admin instructions. Inject 42 units at breakfast and at lunch and 44 units at supper   Yes [provider]  omeprazole (PRILOSEC) 20 MG capsule Take 20 mg by mouth daily.   Yes [provider]  pravastatin (PRAVACHOL) 40 MG tablet Take 40 mg by mouth at bedtime.  05/28/17  Yes [provider]  ramipril (ALTACE) 10 MG capsule Take 10 mg by mouth 2 (two) times daily.   Yes [provider]  spironolactone (ALDACTONE) 25 MG tablet Take 1 tablet (25 mg total) by mouth daily. 12/14/17  Yes Sueanne Margarita, MD    Physical Exam: Vitals:   11/24/18 0922  BP: 101/68  Pulse: 97  Resp: 16  Temp: 97.6 F (36.4 C)  TempSrc: Oral  SpO2: 100%    Constitutional: NAD, calm, comfortable Vitals:   11/24/18 0922  BP: 101/68  Pulse: 97  Resp: 16  Temp: 97.6 F (36.4 C)  TempSrc: Oral  SpO2: 100%   Eyes: PERRL, lids and conjunctivae normal ENMT: Mucous membranes are moist. Posterior pharynx clear of any exudate or lesions.Normal dentition.  Neck:  normal, supple, no masses, no thyromegaly Respiratory: clear to auscultation bilaterally, no wheezing, no crackles. Normal respiratory effort. No accessory muscle use.  Cardiovascular: Regular rate and rhythm, no murmurs / rubs / gallops. No extremity edema. Palpable pedal pulses. Abdomen: no tenderness, no masses palpated. No hepatosplenomegaly. Bowel sounds positive.  Musculoskeletal: RLE shortened and externally rotated Skin: no rashes, lesions, ulcers. No induration Neurologic: CN 2-12 grossly intact. Sensation intact.  Moves all extremities.  Psychiatric: Normal judgment and insight. Alert and oriented x 3. Normal mood.   Labs on Admission: I have personally reviewed following labs and imaging studies  CBC: Recent Labs  Lab 11/24/18 1005  WBC 11.6*  NEUTROABS 8.9*  HGB 13.0  HCT 41.4  MCV 90.8  PLT 937   Basic Metabolic Panel: Recent Labs  Lab 11/24/18 1005  NA 137  K 3.7  CL 99  CO2 28  GLUCOSE 212*  BUN 24*  CREATININE 1.06*  CALCIUM 9.6   GFR: Estimated Creatinine Clearance: 54.4 mL/min ([place]) (by C-G formula based on SCr of 1.06 mg/dL (H)). Liver Function Tests: Recent Labs  Lab 11/24/18 1005  AST 18  ALT 18  ALKPHOS 118  BILITOT 0.9  PROT 7.7  ALBUMIN 4.0   No results for input(s): LIPASE, AMYLASE in the last 168 hours. No results for input(s): AMMONIA in the last 168 hours. Coagulation Profile: Recent Labs  Lab 11/24/18 1005  INR 1.05   Cardiac Enzymes: No results for input(s): CKTOTAL, CKMB, CKMBINDEX, TROPONINI in the last 168 hours. BNP (last 3 results) No results for input(s): PROBNP in the last 8760 hours. HbA1C: No results for input(s): HGBA1C in the last 72 hours. CBG: Recent Labs  Lab 11/24/18 0952  GLUCAP 188*   Lipid Profile: No results for input(s): CHOL, HDL, LDLCALC, TRIG, CHOLHDL, LDLDIRECT in the last 72 hours. Thyroid Function Tests: No results for input(s): TSH, T4TOTAL, FREET4, T3FREE, THYROIDAB in the last 72  hours. Anemia Panel: No results for input(s): VITAMINB12, FOLATE, FERRITIN, TIBC, IRON, RETICCTPCT in the last 72 hours. Urine analysis:    Component Value Date/Time   COLORURINE YELLOW 02/20/2014 [place] 02/20/2014 1201   LABSPEC 1.006 02/20/2014 1201   PHURINE 6.0 02/20/2014 1201   GLUCOSEU NEGATIVE 02/20/2014 1201   HGBUR NEGATIVE 02/20/2014 [place] 02/20/2014 1201   KETONESUR NEGATIVE 02/20/2014 1201   PROTEINUR NEGATIVE 02/20/2014 1201   UROBILINOGEN 0.2 02/20/2014 1201   NITRITE NEGATIVE 02/20/2014 1201   LEUKOCYTESUR SMALL ([place]) 02/20/2014 1201    Radiological Exams on Admission: No results found.  EKG: Independently reviewed. Compared to priors.  NSR.  Q waves in leads III and aVF.  Assessment/Plan Active Problems:   Fall  Concern for R Femoral Neck Fracture  Fall:  Presumed mechanical fall Sims Laday few weeks ago.  She's had RLE pain since fall.  Saw Dr. Berenice Primas in office on Tuesday and instructed to go to ER and get CT and planned for admission, but miscommunication in plan as pt went home.   Plan for OR today Follow CT and plain films of knee Follow chest x ray Echo in 2017 with grade 1 diastolic dysfunction RCRI 2 (pending CXR), she does have q waves in inferior leads of her EKG (q in aVF is new, q in III was present on old ekg) - as she can walk Desarie Feild block and flight of stairs and had no sx consistent with ACS prior to her fall, I don't think she needs any additional cardiac testing prior to surgery.  Follow pending CXR.  Discussed risks/benefits with family.  Hypertension: hold ramipril preop.  Holding HCTZ.  She took dilt this AM.   OSA: CPAP  T2DM: SSI for now.  Post op will need to resume home regimen.  Hx DVT: She doesn't remember this.  In the chart, I see mention of coumadin in some distant notes, but she denies ever being on Karmen Altamirano blood thinner.  Continue  to monitor for now, outpatient follow up.  Abnormal EKG  PVCs: hx PVCs,  follows with cardiology, takes dilt for this.  Q waves on EKG as noted above.  Would follow up with cardiology outpatient.   Recent UTI: recently completed course of keflex for UTI  DVT prophylaxis: scd's now, per ortho post op Code Status: full  Family Communication: husband, grandson at bedside  Disposition Plan: pending  Consults called: ortho  Admission status: inpatient given suspected hip fracture requiring surgery    Fayrene Helper MD [facility] Pager AMION  If 7PM-7AM, please contact night-coverage [email or web address] Password [facility]  11/24/2018, 10:58 AM

## 2018-11-24 NOTE — Transfer of Care (Signed)
Immediate Anesthesia Transfer of Care Note  Patient: Alice Cole  Procedure(s) Performed: TOTAL HIP ARTHROPLASTY ANTERIOR APPROACH (Right Hip)  Patient Location: PACU  Anesthesia Type:Spinal  Level of Consciousness: drowsy  Airway & Oxygen Therapy: Patient Spontanous Breathing and Patient connected to face mask oxygen  Post-op Assessment: Report given to RN and Post -op Vital signs reviewed and stable  Post vital signs: Reviewed and stable  Last Vitals:  Vitals Value Taken Time  BP 129/53 11/24/2018  2:18 PM  Temp    Pulse 82 11/24/2018  2:22 PM  Resp 31 11/24/2018  2:22 PM  SpO2 100 % 11/24/2018  2:22 PM  Vitals shown include unvalidated device data.  Last Pain:  Vitals:   11/24/18 0922  TempSrc: Oral  PainSc:          Complications: No apparent anesthesia complications

## 2018-11-24 NOTE — Anesthesia Procedure Notes (Signed)
Spinal  Start time: 11/24/2018 12:20 PM End time: 11/24/2018 12:25 PM Staffing Anesthesiologist: Suzette Battiest, MD Performed: anesthesiologist  Preanesthetic Checklist Completed: patient identified, site marked, surgical consent, pre-op evaluation, timeout performed, IV checked, risks and benefits discussed and monitors and equipment checked Spinal Block Patient position: sitting Prep: site prepped and draped and DuraPrep Patient monitoring: blood pressure, continuous pulse ox and heart rate Approach: midline Location: L4-5 Injection technique: single-shot Needle Needle type: Pencan  Needle gauge: 24 G Needle length: 9 cm

## 2018-11-24 NOTE — Anesthesia Preprocedure Evaluation (Addendum)
Anesthesia Evaluation  Patient identified by MRN, date of birth, ID band Patient awake    Reviewed: Allergy & Precautions, NPO status , Patient's Chart, lab work & pertinent test results  Airway Mallampati: II  TM Distance: >3 FB Neck ROM: Full    Dental  (+) Dental Advisory Given   Pulmonary sleep apnea ,    breath sounds clear to auscultation       Cardiovascular hypertension, Pt. on medications  Rhythm:Regular Rate:Normal     Neuro/Psych negative neurological ROS     GI/Hepatic Neg liver ROS, GERD  ,  Endo/Other  diabetes, Type 2, Insulin Dependent  Renal/GU negative Renal ROS     Musculoskeletal  (+) Arthritis ,   Abdominal   Peds  Hematology negative hematology ROS (+)   Anesthesia Other Findings   Reproductive/Obstetrics                             Lab Results  Component Value Date   WBC 11.6 (H) 11/24/2018   HGB 13.0 11/24/2018   HCT 41.4 11/24/2018   MCV 90.8 11/24/2018   PLT 299 11/24/2018   Lab Results  Component Value Date   INR 1.05 11/24/2018   INR 0.99 08/24/2010   INR 1.02 11/25/2009   Lab Results  Component Value Date   CREATININE 1.06 (H) 11/24/2018   BUN 24 (H) 11/24/2018   NA 137 11/24/2018   K 3.7 11/24/2018   CL 99 11/24/2018   CO2 28 11/24/2018    Anesthesia Physical Anesthesia Plan  ASA: III  Anesthesia Plan: MAC and Spinal   Post-op Pain Management:    Induction: Intravenous  PONV Risk Score and Plan: 2 and Propofol infusion, Ondansetron and Treatment may vary due to age or medical condition  Airway Management Planned: Natural Airway and Simple Face Mask  Additional Equipment:   Intra-op Plan:   Post-operative Plan:   Informed Consent: I have reviewed the patients History and Physical, chart, labs and discussed the procedure including the risks, benefits and alternatives for the proposed anesthesia with the patient or authorized  representative who has indicated his/her understanding and acceptance.       Plan Discussed with: CRNA  Anesthesia Plan Comments:        Anesthesia Quick Evaluation

## 2018-11-25 DIAGNOSIS — S72001A Fracture of unspecified part of neck of right femur, initial encounter for closed fracture: Principal | ICD-10-CM

## 2018-11-25 LAB — COMPREHENSIVE METABOLIC PANEL
ALT: 46 U/L — ABNORMAL HIGH (ref 0–44)
AST: 40 U/L (ref 15–41)
Albumin: 2.9 g/dL — ABNORMAL LOW (ref 3.5–5.0)
Alkaline Phosphatase: 99 U/L (ref 38–126)
Anion gap: 10 (ref 5–15)
BUN: 24 mg/dL — ABNORMAL HIGH (ref 8–23)
CO2: 26 mmol/L (ref 22–32)
Calcium: 8.6 mg/dL — ABNORMAL LOW (ref 8.9–10.3)
Chloride: 100 mmol/L (ref 98–111)
Creatinine, Ser: 0.97 mg/dL (ref 0.44–1.00)
GFR calc Af Amer: >60 mL/min (ref >60)
GFR calc non Af Amer: 58 mL/min — ABNORMAL LOW (ref >60)
Glucose, Bld: 294 mg/dL — ABNORMAL HIGH (ref 70–99)
Potassium: 4.2 mmol/L (ref 3.5–5.1)
Sodium: 136 mmol/L (ref 135–145)
Total Bilirubin: 0.6 mg/dL (ref 0.3–1.2)
Total Protein: 6 g/dL — ABNORMAL LOW (ref 6.5–8.1)

## 2018-11-25 LAB — GLUCOSE, CAPILLARY
Glucose-Capillary: 277 mg/dL — ABNORMAL HIGH (ref 70–99)
Glucose-Capillary: 249 mg/dL — ABNORMAL HIGH (ref 70–99)
Glucose-Capillary: 143 mg/dL — ABNORMAL HIGH (ref 70–99)
Glucose-Capillary: 112 mg/dL — ABNORMAL HIGH (ref 70–99)

## 2018-11-25 LAB — MAGNESIUM: Magnesium: 2 mg/dL (ref 1.7–2.4)

## 2018-11-25 LAB — CBC
HCT: 32.1 % — ABNORMAL LOW (ref 36.0–46.0)
Hemoglobin: 10.4 g/dL — ABNORMAL LOW (ref 12.0–15.0)
MCH: 29.3 pg (ref 26.0–34.0)
MCHC: 32.4 g/dL (ref 30.0–36.0)
MCV: 90.4 fL (ref 80.0–100.0)
Platelets: 210 10*3/uL (ref 150–400)
RBC: 3.55 MIL/uL — ABNORMAL LOW (ref 3.87–5.11)
RDW: 11.8 % (ref 11.5–15.5)
WBC: 12.7 10*3/uL — ABNORMAL HIGH (ref 4.0–10.5)
nRBC: 0 % (ref 0.0–0.2)

## 2018-11-25 MED ORDER — INSULIN ASPART 100 UNIT/ML ~~LOC~~ SOLN
30.0000 [IU] | Freq: Every day | SUBCUTANEOUS | Status: DC
  Administered 2018-11-25: 30 [IU] via SUBCUTANEOUS

## 2018-11-25 NOTE — Plan of Care (Signed)
Pt stable with no needs. No change to note. No s/s of distress. Pt medicated for pain. Pt did well with Physical therapy in the hall (with mobility). No s/s of distress this am.

## 2018-11-25 NOTE — Progress Notes (Signed)
[facility] PROGRESS NOTE  ENRIQUE WEISS WPY:[id number] DOB: 16-Dec-1945 DOA: 11/24/2018 PCP: Maurice Small, MD   Narrative: 73 year old female Prior ventricular bigeminy Negative stress test 2011 Hyperlipidemia HTN OSA DM TY 2 Previous history of DVT  Admitted with hip fracture from Dr. Mayme Genta office  A & Plan Hip fracture Per orthopedics Weightbearing as tolerated no precautions, aspirin 325 daily X 4 weeks Await therapy re-eval prior to decision regarding disposition-patient prefers to go home PVCs bigeminy Stable at this time Prior concerns for CAD  Had a prior stress test in 2011 that was negative At this time continue meds including Aldactone 25 HCTZ 25 Cardizem 360-blood pressures are well controlled Hyperlipidemia Continue Pravachol HTN Suboptimally controlled probably secondary to pain continue meds as above augment if needed in the outpatient setting OSA Not using machine she will use again today we will reevaluate DM TY 2 Take 3 times a day regular insulin-cannot afford long-acting-we will transition to 3 times a day NovoLog as per home regimen-sugars are ranging 270s to 290s Previous DVT BMI 30  SCDs and aspirin Inpatient Expect discharge to skilled facility when available   Verlon Au, MD  [facility] Via amion app OR [email or web address] 7PM-7AM contact night coverage as above 11/25/2018, 9:30 AM  LOS: 1 day   Consultants:  Orthopedics  Procedures:  None  Antimicrobials:  Perioperative  Interval history/Subjective: Awake alert very talkative no distress Eating and drinking fairly well No pain at this time seems well controlled post surgery No fever   Objective:  Vitals:  Vitals:   11/24/18 2226 11/25/18 0542  BP: (!) 122/59 (!) 148/61  Pulse: 83 83  Resp: 15 16  Temp: 98.3 F (36.8 C) (!) 97.1 F (36.2 C)  SpO2: 98% 97%    Exam:  Alert pleasant oriented no distress EOMI NCAT no pallor no icterus slightly obese Chest  clear no added sound Abdomen soft no rebound no guarding No lower extremity edema Stockings noted on both lower extremities skin integrity not assessed neuromuscular grossly normal except site of surgery   I have personally reviewed the following:  DATA   Labs:  BUN/creatinine 24/0.9 down from 24/1.0  Albumin 2.9  Hemoglobin down from 13-10.4  WBC 12.7   Scheduled Meds: . aspirin EC  325 mg Oral BID PC  . diltiazem  360 mg Oral Daily  . docusate sodium  100 mg Oral BID  . gabapentin  300 mg Oral BID  . hydrochlorothiazide  25 mg Oral Daily  . insulin aspart  0-15 Units Subcutaneous TID WC  . insulin aspart  30 Units Subcutaneous Q breakfast   And  . insulin aspart  32 Units Subcutaneous Q supper  . lip balm      . pantoprazole  40 mg Oral Daily  . pravastatin  40 mg Oral QHS  . spironolactone  25 mg Oral Daily   Continuous Infusions: . sodium chloride 100 mL/hr at 11/24/18 2132  . methocarbamol (ROBAXIN) IV      Principal Problem:   Displaced fracture of right femoral neck (HCC) Active Problems:   Fall   LOS: 1 day

## 2018-11-25 NOTE — Progress Notes (Signed)
Physical Therapy Treatment Patient Details Name: Alice Cole MRN: [id number] DOB: 1946-07-07 Today's Date: 11/25/2018    History of Present Illness 73 yo female s/p R DA-THA on 11/24/18. Pt with mechanical fall 3 wks ago resulting in femoral neck fracture. PMH includes DVT, DMII, GERD, HLD, HTN, OSA, PVCs, diverticulitis, L and R knee surgery.     PT Comments    POD # 1 am session Assisted with amb in hallway then returned to room to perform some TE's followed by ICE. Instructed on how to use her strap to self assist LE.    Follow Up Recommendations  Follow surgeon's recommendation for DC plan and follow-up therapies;Supervision for mobility/OOB(pt and daughter shared D/C plan was for SNF)     Equipment Recommendations  Rolling walker with 5" wheels;3in1 (PT)    Recommendations for Other Services       Precautions / Restrictions Precautions Precautions: Fall Restrictions Weight Bearing Restrictions: No Other Position/Activity Restrictions: WBAT     Mobility  Bed Mobility Overal bed mobility: Needs Assistance Bed Mobility: Supine to Sit           General bed mobility comments: demonstarted and instructed howe to use a belt to self assist LE off bed   Transfers Overall transfer level: Needs assistance Equipment used: Rolling walker (2 wheeled) Transfers: Sit to/from [facility] Sit to Stand: Min assist Stand pivot transfers: Min assist       General transfer comment: 50% Vc's safety with turns and increased time  Ambulation/Gait Ambulation/Gait assistance: Min guard;Min assist Gait Distance (Feet): 42 Feet Assistive device: Rolling walker (2 wheeled) Gait Pattern/deviations: Step-to pattern;Decreased stance time - right;Decreased weight shift to right;Antalgic Gait velocity: decreased    General Gait Details: 50% VC's on proper walker to self distance and safety with turns    Marine scientist  Rankin (Stroke Patients Only)       Balance                                            Cognition Arousal/Alertness: Awake/alert Behavior During Therapy: WFL for tasks assessed/performed Overall Cognitive Status: Within Functional Limits for tasks assessed                                        Exercises   Total Hip Replacement TE's 10 reps ankle pumps 10 reps knee presses 10 reps heel slides 10 reps SAQ's 10 reps ABD Followed by ICE    General Comments        Pertinent Vitals/Pain Pain Assessment: 0-10 Pain Score: 5  Pain Location: R hip/groin Pain Descriptors / Indicators: Discomfort;Grimacing Pain Intervention(s): Monitored during session;Relaxation;Repositioned    Home Living                      Prior Function            PT Goals (current goals can now be found in the care plan section) Progress towards PT goals: Progressing toward goals    Frequency    Min 5X/week      PT Plan Current plan remains appropriate    Co-evaluation  AM-PAC PT "6 Clicks" Mobility   Outcome Measure  Help needed turning from your back to your side while in a flat bed without using bedrails?: A Little Help needed moving from lying on your back to sitting on the side of a flat bed without using bedrails?: A Little Help needed moving to and from a bed to a chair (including a wheelchair)?: A Little Help needed standing up from a chair using your arms (e.g., wheelchair or bedside chair)?: A Little Help needed to walk in hospital room?: A Little Help needed climbing 3-5 steps with a railing? : A Lot 6 Click Score: 17    End of Session Equipment Utilized During Treatment: Gait belt Activity Tolerance: Patient tolerated treatment well Patient left: in chair;with chair alarm set;with call bell/phone within reach;with SCD's reapplied Nurse Communication: Mobility status PT Visit Diagnosis: Other abnormalities of  gait and mobility (R26.89);Difficulty in walking, not elsewhere classified (R26.2)     Time: 6168-3729 PT Time Calculation (min) (ACUTE ONLY): 23 min  Charges:  $Gait Training: 8-22 mins $Therapeutic Exercise: 8-22 mins                     Rica Koyanagi  PTA Acute  Rehabilitation Services Pager      [phone] Office      [phone]

## 2018-11-25 NOTE — Progress Notes (Signed)
CSW referral for SNF placement. Jonnie Finner RN CCM Case Mgmt phone [phone]

## 2018-11-25 NOTE — Progress Notes (Signed)
Physical Therapy Treatment Patient Details Name: Alice Cole MRN: [id number] DOB: [birth year] Today's Date: 11/25/2018    History of Present Illness 73 yo female s/p R DA-THA on 11/24/18. Pt with mechanical fall 3 wks ago resulting in femoral neck fracture. PMH includes DVT, DMII, GERD, HLD, HTN, OSA, PVCs, diverticulitis, L and R knee surgery.     PT Comments    POD # 1 pm session Assisted OOB to amb a second time then assisted back to bed and performed some TE's followed by ICE.   Follow Up Recommendations  Follow surgeon's recommendation for DC plan and follow-up therapies;Supervision for mobility/OOB(pt and daughter sharred D/C plan was for SNF)     Equipment Recommendations  Rolling walker with 5" wheels;3in1 (PT)    Recommendations for Other Services       Precautions / Restrictions Precautions Precautions: Fall Restrictions Weight Bearing Restrictions: No Other Position/Activity Restrictions: WBAT     Mobility  Bed Mobility Overal bed mobility: Needs Assistance Bed Mobility: Supine to Sit;Sit to Supine     Supine to sit: Min assist Sit to supine: Min assist;Mod assist   General bed mobility comments: demonstarted and instructed howe to use a belt to self assist LE off bed required increased assist back to bed   Transfers Overall transfer level: Needs assistance Equipment used: Rolling walker (2 wheeled) Transfers: Sit to/from [facility] Sit to Stand: Min assist Stand pivot transfers: Min assist       General transfer comment: 50% Vc's safety with turns and increased time  Ambulation/Gait Ambulation/Gait assistance: Min guard;Min assist Gait Distance (Feet): 46 Feet Assistive device: Rolling walker (2 wheeled) Gait Pattern/deviations: Step-to pattern;Decreased stance time - right;Decreased weight shift to right;Antalgic Gait velocity: decreased    General Gait Details: 50% VC's on proper walker to self distance and safety with turns     Marine scientist Rankin (Stroke Patients Only)       Balance                                            Cognition Arousal/Alertness: Awake/alert Behavior During Therapy: WFL for tasks assessed/performed Overall Cognitive Status: Within Functional Limits for tasks assessed                                        Exercises  10 reps AP, knee presses, HS with strap    General Comments        Pertinent Vitals/Pain Pain Assessment: 0-10 Pain Score: 5  Pain Location: R hip/groin Pain Descriptors / Indicators: Discomfort;Grimacing Pain Intervention(s): Monitored during session;Relaxation;Repositioned    Home Living                      Prior Function            PT Goals (current goals can now be found in the care plan section) Progress towards PT goals: Progressing toward goals    Frequency    Min 5X/week      PT Plan Current plan remains appropriate    Co-evaluation              AM-PAC PT "6 Clicks" Mobility   Outcome  Measure  Help needed turning from your back to your side while in a flat bed without using bedrails?: A Little Help needed moving from lying on your back to sitting on the side of a flat bed without using bedrails?: A Little Help needed moving to and from a bed to a chair (including a wheelchair)?: A Little Help needed standing up from a chair using your arms (e.g., wheelchair or bedside chair)?: A Little Help needed to walk in hospital room?: A Little Help needed climbing 3-5 steps with a railing? : A Lot 6 Click Score: 17    End of Session Equipment Utilized During Treatment: Gait belt Activity Tolerance: Patient tolerated treatment well Patient left: in chair;with chair alarm set;with call bell/phone within reach;with SCD's reapplied Nurse Communication: Mobility status PT Visit Diagnosis: Other abnormalities of gait and mobility  (R26.89);Difficulty in walking, not elsewhere classified (R26.2)     Time: 1425-1450 PT Time Calculation (min) (ACUTE ONLY): 25 min  Charges:  $Gait Training: 8-22 mins $Therapeutic Exercise: 8-22 mins                     Rica Koyanagi  PTA Acute  Rehabilitation Services Pager      [phone] Office      [phone]

## 2018-11-25 NOTE — Progress Notes (Signed)
Subjective: 1 Day Post-Op Procedure(s) (LRB): TOTAL HIP ARTHROPLASTY ANTERIOR APPROACH (Right) Patient reports pain as moderate.  Taking by mouth and voiding okay.  Complains of moderate right hip pain.  Objective: Vital signs in last 24 hours: Temp:  [97.1 F (36.2 C)-98.6 F (37 C)] 97.1 F (36.2 C) (02/15 0542) Pulse Rate:  [72-97] 83 (02/15 0542) Resp:  [14-27] 16 (02/15 0542) BP: (101-148)/(47-83) 148/61 (02/15 0542) SpO2:  [96 %-100 %] 97 % (02/15 0542) Weight:  [87.1 kg] 87.1 kg (02/14 1607)  Intake/Output from previous day: 02/14 0701 - 02/15 0700 In: 3430 [P.O.:480; I.V.:2650; IV Piggyback:300] Out: 2475 [Urine:2225; Blood:250] Intake/Output this shift: Total I/O In: 240 [P.O.:240] Out: -   Recent Labs    11/24/18 1005 11/25/18 0509  HGB 13.0 10.4*   Recent Labs    11/24/18 1005 11/25/18 0509  WBC 11.6* 12.7*  RBC 4.56 3.55*  HCT 41.4 32.1*  PLT 299 210   Recent Labs    11/24/18 1005 11/25/18 0509  NA 137 136  K 3.7 4.2  CL 99 100  CO2 28 26  BUN 24* 24*  CREATININE 1.06* 0.97  GLUCOSE 212* 294*  CALCIUM 9.6 8.6*   Recent Labs    11/24/18 1005  INR 1.05  Right hip exam:  Neurovascular intact Sensation intact distally Intact pulses distally Dorsiflexion/Plantar flexion intact Incision: dressing C/D/I Compartment soft   Assessment/Plan: 1 Day Post-Op Procedure(s) (LRB): TOTAL HIP ARTHROPLASTY ANTERIOR APPROACH (Right) Diabetes.  Insulin-dependent. Plan: Weight-bear as tolerated on right without hip precautions. Aspirin 325 mg twice daily x1 month postop for DVT prophylaxis. Up with therapy Discharge home with home health in 1 to 2 days. Diabetes management per hospitalist.  We appreciate their help in caring for Ms. Dewolfe.      Erlene Senters 11/25/2018, 9:05 AM

## 2018-11-26 LAB — CBC WITH DIFFERENTIAL/PLATELET
Abs Immature Granulocytes: 0.19 10*3/uL — ABNORMAL HIGH (ref 0.00–0.07)
Basophils Absolute: 0 10*3/uL (ref 0.0–0.1)
Basophils Relative: 0 %
Eosinophils Absolute: 0 10*3/uL (ref 0.0–0.5)
Eosinophils Relative: 0 %
HCT: 34.2 % — ABNORMAL LOW (ref 36.0–46.0)
Hemoglobin: 11.1 g/dL — ABNORMAL LOW (ref 12.0–15.0)
Immature Granulocytes: 1 %
Lymphocytes Relative: 8 %
Lymphs Abs: 1.2 10*3/uL (ref 0.7–4.0)
MCH: 29.4 pg (ref 26.0–34.0)
MCHC: 32.5 g/dL (ref 30.0–36.0)
MCV: 90.7 fL (ref 80.0–100.0)
Monocytes Absolute: 1 10*3/uL (ref 0.1–1.0)
Monocytes Relative: 7 %
Neutro Abs: 12.1 10*3/uL — ABNORMAL HIGH (ref 1.7–7.7)
Neutrophils Relative %: 84 %
Platelets: 281 10*3/uL (ref 150–400)
RBC: 3.77 MIL/uL — ABNORMAL LOW (ref 3.87–5.11)
RDW: 11.9 % (ref 11.5–15.5)
WBC: 14.5 10*3/uL — ABNORMAL HIGH (ref 4.0–10.5)
nRBC: 0 % (ref 0.0–0.2)

## 2018-11-26 LAB — GLUCOSE, CAPILLARY
Glucose-Capillary: 172 mg/dL — ABNORMAL HIGH (ref 70–99)
Glucose-Capillary: 161 mg/dL — ABNORMAL HIGH (ref 70–99)

## 2018-11-26 MED ORDER — POLYETHYLENE GLYCOL 3350 17 G PO PACK: 17.0000 g | PACK | Freq: Every day | ORAL | 0 refills | Status: DC | PRN

## 2018-11-26 MED ORDER — GABAPENTIN 300 MG PO CAPS: 300.0000 mg | ORAL_CAPSULE | Freq: Two times a day (BID) | ORAL | 0 refills | Status: DC

## 2018-11-26 MED ORDER — METHOCARBAMOL 500 MG PO TABS: 500.0000 mg | ORAL_TABLET | Freq: Four times a day (QID) | ORAL | 1 refills | Status: DC | PRN

## 2018-11-26 NOTE — Progress Notes (Signed)
Physical Therapy Treatment Patient Details Name: Alice Cole MRN: [id number] DOB: 1945-10-30 Today's Date: 11/26/2018    History of Present Illness 73 yo female s/p R DA-THA on 11/24/18. Pt with mechanical fall 3 wks ago resulting in femoral neck fracture. PMH includes DVT, DMII, GERD, HLD, HTN, OSA, PVCs, diverticulitis, L and R knee surgery.     PT Comments    Patient has progressed to return home with family.   Follow Up Recommendations  Home health PT     Equipment Recommendations  Rolling walker with 5" wheels;3in1 (PT)    Recommendations for Other Services       Precautions / Restrictions Precautions Precautions: Fall Restrictions Weight Bearing Restrictions: No Other Position/Activity Restrictions: WBAT     Mobility  Bed Mobility Overal bed mobility: Modified Independent Bed Mobility: Sit to Supine       Sit to supine: Supervision   General bed mobility comments: no assist  Transfers Overall transfer level: Needs assistance Equipment used: Rolling walker (2 wheeled) Transfers: Sit to/from Stand Sit to Stand: Supervision         General transfer comment: supervision for safety from bed and 3 in 1  Ambulation/Gait Ambulation/Gait assistance: Min guard Gait Distance (Feet): 80 Feet Assistive device: Rolling walker (2 wheeled) Gait Pattern/deviations: Step-to pattern;Step-through pattern Gait velocity: decreased        Stairs Stairs: Yes Stairs assistance: Min assist Stair Management: Step to pattern;Forwards;With crutches;One rail Right Number of Stairs: 3 General stair comments: family present to assist.   Wheelchair Mobility    Modified Rankin (Stroke Patients Only)       Balance Overall balance assessment: Mild deficits observed, not formally tested                                          Cognition Arousal/Alertness: Awake/alert Behavior During Therapy: WFL for tasks assessed/performed Overall Cognitive  Status: Within Functional Limits for tasks assessed                                        Exercises      General Comments        Pertinent Vitals/Pain Pain Assessment: Faces Faces Pain Scale: Hurts a little bit Pain Location: R hip/groin Pain Descriptors / Indicators: Discomfort;Sore Pain Intervention(s): Monitored during session;Premedicated before session    [place] expects to be discharged to:: Private residence Living Arrangements: Spouse/significant other;Other relatives;Children Available Help at Discharge: Family;Available 24 hours/day Type of Home: House Home Access: Stairs to enter Entrance Stairs-Rails: None Home Layout: One level Home Equipment: Crutches;Cane - single point      Prior Function Level of Independence: Independent with assistive device(s)      Comments: prior to admission, pt using crutches for mobility and using R knee brace    PT Goals (current goals can now be found in the care plan section) Acute Rehab PT Goals Patient Stated Goal: to return home Progress towards PT goals: Progressing toward goals    Frequency    Min 5X/week      PT Plan Discharge plan needs to be updated    Co-evaluation              AM-PAC PT "6 Clicks" Mobility   Outcome Measure  Help needed turning from your back to  your side while in a flat bed without using bedrails?: A Little Help needed moving from lying on your back to sitting on the side of a flat bed without using bedrails?: A Little Help needed moving to and from a bed to a chair (including a wheelchair)?: A Little Help needed standing up from a chair using your arms (e.g., wheelchair or bedside chair)?: A Little Help needed to walk in hospital room?: A Little Help needed climbing 3-5 steps with a railing? : A Lot 6 Click Score: 17    End of Session Equipment Utilized During Treatment: Gait belt Activity Tolerance: Patient tolerated treatment well Patient  left: in bed;with call bell/phone within reach;with family/visitor present Nurse Communication: Mobility status PT Visit Diagnosis: Other abnormalities of gait and mobility (R26.89);Difficulty in walking, not elsewhere classified (R26.2)     Time: 1041-1100 PT Time Calculation (min) (ACUTE ONLY): 19 min  Charges:  $Gait Training: 8-22 mins                     Tresa Endo PT Acute Rehabilitation Services Pager [phone] Office [phone]    Claretha Cooper 11/26/2018, 1:47 PM

## 2018-11-26 NOTE — Care Management Note (Signed)
Case Management Note  Patient Details  Name: Alice Cole MRN: [id number] Date of Birth: [birth year]  Subjective/Objective:    Falls, DM, s/p  R THA               Action/Plan: NCM spoke to pt and she declines SNF. She wants to go home with [facility]. States her husband will be at home to assist with care. Offered choice for HH, explained [facility] that maybe able to provide additional in home aide services after declining SNF. Pt agreeable. Medicare list provided and placed on chart. Contacted [facility] for RW and 3n1 bedside commode for home. Contacted [facility] with new referral.   Expected Discharge Date:  11/26/18               Expected Discharge Plan:  [place]  In-House Referral:  Clinical Social Work  Discharge planning Services  CM Consult  Post Acute Care Choice:  Home Health Choice offered to:  Patient  DME Arranged:  3-N-1, Walker rolling DME Agency:  [place]:  PT, RN, Disease Management, Social Work, Nurse's Aide [place]:  [place]  Status of Service:  Completed, signed off  If discussed at [employer], dates discussed:    Additional Comments:  Erenest Rasher, RN 11/26/2018, 9:20 AM

## 2018-11-26 NOTE — Progress Notes (Signed)
Subjective: 2 Days Post-Op Procedure(s) (LRB): TOTAL HIP ARTHROPLASTY ANTERIOR APPROACH (Right) Patient reports pain as mild.  Making excellent progress with physical therapy.  Taking by mouth and voiding okay.  Wants to go home today.  She has help at home.  Objective: Vital signs in last 24 hours: Temp:  [98 F (36.7 C)-98.1 F (36.7 C)] 98.1 F (36.7 C) (02/15 2149) Pulse Rate:  [74-87] 74 (02/15 2149) Resp:  [16-18] 16 (02/15 2149) BP: (132-154)/(57-58) 132/57 (02/15 2149) SpO2:  [96 %-99 %] 96 % (02/15 2149)  Intake/Output from previous day: 02/15 0701 - 02/16 0700 In: 3220 [P.O.:1320; I.V.:150] Out: 0  Intake/Output this shift: Total I/O In: 480 [P.O.:480] Out: -   Recent Labs    11/24/18 1005 11/25/18 0509 11/26/18 0837  HGB 13.0 10.4* 11.1*   Recent Labs    11/25/18 0509 11/26/18 0837  WBC 12.7* 14.5*  RBC 3.55* 3.77*  HCT 32.1* 34.2*  PLT 210 281   Recent Labs    11/24/18 1005 11/25/18 0509  NA 137 136  K 3.7 4.2  CL 99 100  CO2 28 26  BUN 24* 24*  CREATININE 1.06* 0.97  GLUCOSE 212* 294*  CALCIUM 9.6 8.6*   Recent Labs    11/24/18 1005  INR 1.05   Right hip exam: Neurovascular intact Sensation intact distally Intact pulses distally Dorsiflexion/Plantar flexion intact Incision: dressing C/D/I Compartment soft   Assessment/Plan: 2 Days Post-Op Procedure(s) (LRB): TOTAL HIP ARTHROPLASTY ANTERIOR APPROACH (Right)  Plan: Discharge home today with home health physical therapy. Weight-bear as tolerated on right without hip precautions. Medications have been sent in by the hospitalists.  We appreciate their help.  I did note that Robaxin and tizanidine have been sent in.  We will cancel the Robaxin and just go with tizanidine as needed for spasm. She will use Tylenol for pain. Follow-up with Dr. Berenice Primas in 2 weeks in the office.    Erlene Senters 11/26/2018, 10:56 AM

## 2018-11-26 NOTE — Evaluation (Signed)
Occupational Therapy Evaluation and Discharge Patient Details Name: Alice Cole MRN: [id number] DOB: November 26, 1945 Today's Date: 11/26/2018    History of Present Illness 73 yo female s/p R DA-THA on 11/24/18. Pt with mechanical fall 3 wks ago resulting in femoral neck fracture. PMH includes DVT, DMII, GERD, HLD, HTN, OSA, PVCs, diverticulitis, L and R knee surgery.    Clinical Impression   Pt was independent prior to her fall 3 weeks ago. Presents with mild R hip pain, but demonstrated ability to ambulate, perform toileting, standing grooming and dressing with AE with supervision. Daughter to purchase AE for home use. Educated pt and daughter in multiple uses of 3 in 78. Pt has assist of her spouse and grandsons at home as needed. No further OT needs.    Follow Up Recommendations  No OT follow up    Equipment Recommendations  3 in 1 bedside commode    Recommendations for Other Services       Precautions / Restrictions Precautions Precautions: Fall Restrictions Weight Bearing Restrictions: No Other Position/Activity Restrictions: WBAT       Mobility Bed Mobility Overal bed mobility: Modified Independent             General bed mobility comments: HOB up, no assist  Transfers Overall transfer level: Needs assistance Equipment used: Rolling walker (2 wheeled) Transfers: Sit to/from Stand Sit to Stand: Supervision         General transfer comment: supervision for safety from bed and 3 in 1    Balance Overall balance assessment: Mild deficits observed, not formally tested                                         ADL either performed or assessed with clinical judgement   ADL Overall ADL's : Needs assistance/impaired Eating/Feeding: Independent;Sitting   Grooming: Wash/dry hands;Standing;Supervision/safety   Upper Body Bathing: Set up;Sitting   Lower Body Bathing: Supervison/ safety;Sit to/from stand;With adaptive equipment   Upper Body Dressing :  Set up;Sitting   Lower Body Dressing: Supervision/safety;Sit to/from stand;With adaptive equipment   Toilet Transfer: Supervision/safety;Ambulation;RW;BSC(over toilet)   Toileting- Clothing Manipulation and Hygiene: Supervision/safety;Sit to/from Nurse, children's Details (indicate cue type and reason): educated in technique with RW and 3 in 1 Functional mobility during ADLs: Supervision/safety;Rolling walker General ADL Comments: Educated in multiple uses and set up of 3 in 1 and practiced use of AE for LB ADL.     Vision Baseline Vision/History: Wears glasses Wears Glasses: At all times Patient Visual Report: No change from baseline       Perception     Praxis      Pertinent Vitals/Pain Pain Assessment: Faces Faces Pain Scale: Hurts a little bit Pain Location: R hip/groin Pain Descriptors / Indicators: Discomfort;Sore Pain Intervention(s): Monitored during session;Repositioned     Hand Dominance Right   Extremity/Trunk Assessment Upper Extremity Assessment Upper Extremity Assessment: Overall WFL for tasks assessed(arthritic changes in hands)   Lower Extremity Assessment Lower Extremity Assessment: Defer to PT evaluation   Cervical / Trunk Assessment Cervical / Trunk Assessment: Normal   Communication Communication Communication: No difficulties   Cognition Arousal/Alertness: Awake/alert Behavior During Therapy: WFL for tasks assessed/performed Overall Cognitive Status: Within Functional Limits for tasks assessed  General Comments       Exercises     Shoulder Instructions      Home Living Family/patient expects to be discharged to:: Private residence Living Arrangements: Spouse/significant other;Other relatives;Children Available Help at Discharge: Family;Available 24 hours/day Type of Home: House Home Access: Stairs to enter [employer] of Steps: 3 Entrance Stairs-Rails:  None Home Layout: One level     Bathroom Shower/Tub: Occupational psychologist: Standard     Home Equipment: Crutches;Cane - single point          Prior Functioning/Environment Level of Independence: Independent with assistive device(s)        Comments: prior to admission, pt using crutches for mobility and using R knee brace         OT Problem List:        OT Treatment/Interventions:      OT Goals(Current goals can be found in the care plan section) Acute Rehab OT Goals Patient Stated Goal: to return home OT Goal Formulation: With patient  OT Frequency:     Barriers to D/C:            Co-evaluation              AM-PAC OT "6 Clicks" Daily Activity     Outcome Measure Help from another person eating meals?: None Help from another person taking care of personal grooming?: A Little Help from another person toileting, which includes using toliet, bedpan, or urinal?: A Little Help from another person bathing (including washing, rinsing, drying)?: A Little Help from another person to put on and taking off regular upper body clothing?: None Help from another person to put on and taking off regular lower body clothing?: A Little 6 Click Score: 20   End of Session Equipment Utilized During Treatment: Gait belt;Rolling walker  Activity Tolerance: Patient tolerated treatment well Patient left: in chair;with call bell/phone within reach;with chair alarm set;with family/visitor present  OT Visit Diagnosis: Unsteadiness on feet (R26.81);Pain Pain - Right/Left: Right Pain - part of body: Hip                Time: 0934-1010 OT Time Calculation (min): 36 min Charges:  OT General Charges $OT Visit: 1 Visit OT Evaluation $OT Eval Low Complexity: 1 Low OT Treatments $Self Care/Home Management : 8-22 mins  Nestor Lewandowsky, OTR/L Acute Rehabilitation Services Pager: [phone] Office: [phone]  Malka So 11/26/2018, 10:15 AM

## 2018-11-26 NOTE — Discharge Summary (Signed)
Physician Discharge Summary  Alice Cole ZHG:[id number] DOB: [birth year] DOA: 11/24/2018  PCP: Maurice Small, MD  Admit date: 11/24/2018 Discharge date: 11/26/2018  Time spent: 30 minutes  Recommendations for Outpatient Follow-up:  1. Will order home health PT as well as rolling walker in 1 bedside commode and patient will be discharging home as is done remarkably well 2. Get CBC as well as Chem-12 in about 1 week 3. Follow-up outpatient with orthopedics-weightbearing as tolerated-aspirin high-dose for 1 month postoperatively until 3/14 4. Suggest outpatient initiation of 70/30 insulin to easy amount of needlesticks  Discharge Diagnoses:  Principal Problem:   Displaced fracture of right femoral neck ([place]) Active Problems:   Fall   Discharge Condition: Improved  Diet recommendation: Heart healthy  Filed Weights   11/24/18 1607  Weight: 87.1 kg    History of present illness:  73 year old female Prior ventricular bigeminy Negative stress test 2011 Hyperlipidemia HTN OSA DM TY 2 Previous history of DVT  Admitted with hip fracture from Dr. Mayme Genta office  [facility] Course:  Hip fracture Per orthopedics Weightbearing as tolerated no precautions, aspirin 325 daily X 4 weeks Patient did remarkably well was able to ambulate around the unit and has been independent getting up and about in the room She is sensitive to opiates so I discontinued them instead she will use Tylenol as well as Robaxin Can be weightbearing as tolerated PVCs bigeminy Stable at this time Prior concerns for CAD             Had a prior stress test in 2011 that was negative At this time continue meds including Aldactone 25 HCTZ 25 Cardizem 360-blood pressures are well controlled Hyperlipidemia Continue Pravachol HTN Controlled with no distress OSA Resume CPAP machine as an outpatient DM TY 2 Take 3 times a day regular insulin-cannot afford long-acting-we will transition to 3 times a day NovoLog as  per home regimen-sugars came down after reimplemented home regimen of short acting insulin May need outpatient discussion about 70/30 insulin to ease amount of needle sticks  Procedures:  Right total hip replacement 11/24/2018  Consultations:  Orthopedics Dr. Berenice Primas  Discharge Exam: Vitals:   11/25/18 1411 11/25/18 2149  BP: (!) 154/58 (!) 132/57  Pulse: 87 74  Resp: 18 16  Temp: 98 F (36.7 C) 98.1 F (36.7 C)  SpO2: 99% 96%    General: EOMI NCAT in no distress Cardiovascular: S1-S2 no murmur rub or gallop no adventitious beats Respiratory: Clinically clear no added sound no rales no rhonchi  abdomen is soft nontender nondistended no rebound no guarding Neurologically intact without any focal deficit    Discharge Instructions   Discharge Instructions    Diet - low sodium heart healthy   Complete by:  As directed    Discharge instructions   Complete by:  As directed    Follow with Orthopedics as an OP Please ensure you get screening labs in 1-2 weeks at your regular Dr. Harley Alto will arrange for Home health and equipment at home for you Keep up the good work and good luck with rehab   Increase activity slowly   Complete by:  As directed      Allergies as of 11/26/2018      Reactions   Metoprolol Anaphylaxis   Nortriptyline Anaphylaxis   Olmesartan Anaphylaxis   Tradjenta [linagliptin] Shortness Of Breath   Alatrofloxacin Dermatitis   Beta Adrenergic Blockers Other (See Comments)   Reaction: Low Heart Rate   Cephalexin Diarrhea, Nausea And Vomiting  Codeine Other (See Comments)   tachycardia   Crestor [rosuvastatin Calcium] Other (See Comments)   Leg cramps   Flagyl [metronidazole] Nausea Only   Fluvastatin Sodium Itching   Levemir [insulin Detemir] Itching, Other (See Comments)   Bad mood   Lisinopril Nausea Only   Sulfamethoxazole-trimethoprim Nausea Only, Other (See Comments)   REACTION: nausea, irreg. heartrate   Verapamil Swelling, Other (See Comments)    Chest pain   Welchol [colesevelam Hcl] Nausea And Vomiting   Adhesive [tape] Rash   Band Aids   Sitagliptin Rash   Trovan [trovafloxacin] Rash, Other (See Comments)      Medication List    STOP taking these medications   cephALEXin 500 MG capsule Commonly known as:  KEFLEX     TAKE these medications   acetaminophen 500 MG tablet Commonly known as:  TYLENOL Take 1,000 mg by mouth every 6 (six) hours as needed for moderate pain or headache.   ALPRAZolam 0.25 MG tablet Commonly known as:  XANAX Take 0.125-0.25 mg by mouth daily as needed for anxiety.   aspirin EC 325 MG tablet Take 1 tablet (325 mg total) by mouth 2 (two) times daily after a meal. Take x 1 month post op to decrease risk of blood clots. What changed:    medication strength  how much to take  when to take this  additional instructions   diltiazem 360 MG 24 hr capsule Commonly known as:  CARDIZEM CD TAKE 1 CAPSULE (360 MG TOTAL) BY MOUTH DAILY.   docusate sodium 100 MG capsule Commonly known as:  COLACE Take 1 capsule (100 mg total) by mouth 2 (two) times daily.   gabapentin 300 MG capsule Commonly known as:  NEURONTIN Take 1 capsule (300 mg total) by mouth 2 (two) times daily.   hydrochlorothiazide 25 MG tablet Commonly known as:  HYDRODIURIL Take 25 mg by mouth daily.   insulin aspart 100 UNIT/ML injection Commonly known as:  novoLOG Inject 42-44 Units into the skin See admin instructions. Inject 42 units at breakfast and at lunch and 44 units at supper   methocarbamol 500 MG tablet Commonly known as:  ROBAXIN Take 1 tablet (500 mg total) by mouth every 6 (six) hours as needed for muscle spasms.   omeprazole 20 MG capsule Commonly known as:  PRILOSEC Take 20 mg by mouth daily.   polyethylene glycol packet Commonly known as:  MIRALAX / GLYCOLAX Take 17 g by mouth daily as needed for mild constipation.   pravastatin 40 MG tablet Commonly known as:  PRAVACHOL Take 40 mg by mouth at  bedtime.   ramipril 10 MG capsule Commonly known as:  ALTACE Take 10 mg by mouth 2 (two) times daily.   spironolactone 25 MG tablet Commonly known as:  ALDACTONE Take 1 tablet (25 mg total) by mouth daily.   tiZANidine 2 MG tablet Commonly known as:  ZANAFLEX Take 1 tablet (2 mg total) by mouth every 8 (eight) hours as needed for muscle spasms.            Durable Medical Equipment  (From admission, onward)         Start     Ordered   11/26/18 0839  DME 3-in-1  Once     11/26/18 0841   11/26/18 0839  For home use only DME Walker rolling  [facility])  Once    Question:  Patient needs a walker to treat with the following condition  Answer:  Hip fracture ([place])   11/26/18  0841   11/26/18 0836  For home use only DME Walker rolling  Once    Question:  Patient needs a walker to treat with the following condition  Answer:  Hip avulsion fracture, left, closed, initial encounter ([place])   11/26/18 6812   11/26/18 0835  For home use only DME 3 n 1  Once     11/26/18 0836         Allergies  Allergen Reactions  . Metoprolol Anaphylaxis  . Nortriptyline Anaphylaxis  . Olmesartan Anaphylaxis  . Tradjenta [Linagliptin] Shortness Of Breath  . Alatrofloxacin Dermatitis  . Beta Adrenergic Blockers Other (See Comments)    Reaction: Low Heart Rate  . Cephalexin Diarrhea and Nausea And Vomiting  . Codeine Other (See Comments)    tachycardia  . Crestor [Rosuvastatin Calcium] Other (See Comments)    Leg cramps  . Flagyl [Metronidazole] Nausea Only  . Fluvastatin Sodium Itching  . Levemir [Insulin Detemir] Itching and Other (See Comments)    Bad mood  . Lisinopril Nausea Only  . Sulfamethoxazole-Trimethoprim Nausea Only and Other (See Comments)    REACTION: nausea, irreg. heartrate  . Verapamil Swelling and Other (See Comments)    Chest pain  . Welchol [Colesevelam Hcl] Nausea And Vomiting  . Adhesive [Tape] Rash    Band Aids  . Sitagliptin Rash  . Trovan [Trovafloxacin] Rash and  Other (See Comments)   Follow-up Information    Dorna Leitz, MD. Schedule an appointment as soon as possible for a visit in 2 weeks.   Specialty:  Orthopedic Surgery Contact information: [place] [address] [phone]            The results of significant diagnostics from this hospitalization (including imaging, microbiology, ancillary and laboratory) are listed below for reference.    Significant Diagnostic Studies: Dg Chest 2 View  Result Date: 11/24/2018 CLINICAL DATA:  73 year old female scheduled for total hip replacement today. EXAM: CHEST - 2 VIEW COMPARISON:  Chest x-ray 09/10/2013. FINDINGS: Lung volumes are low. No consolidative airspace disease. No pleural effusions. No pneumothorax. No pulmonary nodule or mass noted. Pulmonary vasculature and the cardiomediastinal silhouette are within normal limits. IMPRESSION: 1. Low lung volumes without radiographic evidence of acute cardiopulmonary disease. Electronically Signed   By: Vinnie Langton M.D.   On: 11/24/2018 13:27   Dg Knee 1-2 Views Right  Result Date: 11/24/2018 CLINICAL DATA:  Anteromedial RIGHT knee pain, recent fall, planned hip replacement surgery EXAM: RIGHT KNEE - 1-2 VIEW COMPARISON:  None FINDINGS: Osseous demineralization. Prior patellar resection. Narrowing of mediolateral compartments. Mild chondrocalcinosis question CPPD. No acute fracture, dislocation, or bone destruction. Scattered atherosclerotic calcifications. No significant knee joint effusion. IMPRESSION: Degenerative changes and question CPPD RIGHT knee. Prior patellar resection. No acute abnormalities. Electronically Signed   By: Lavonia Dana M.D.   On: 11/24/2018 13:27   Ct Hip Right Wo Contrast  Result Date: 11/24/2018 CLINICAL DATA:  Recent fall.  Femoral neck fracture. EXAM: CT OF THE RIGHT HIP WITHOUT CONTRAST TECHNIQUE: Multidetector CT imaging of the right hip was performed according to the standard protocol. Multiplanar CT  image reconstructions were also generated. COMPARISON:  None. FINDINGS: Bones/Joint/Cartilage The bones appear mildly demineralized. There is a comminuted and displaced fracture of the mid right femoral neck. This fracture demonstrates up to 2.2 cm of proximal displacement and moderate apex anterolateral angulation. The femoral head is located. There is no evidence of right hemipelvis fracture. There is no large hip joint effusion or significant underlying  right hip arthropathy. Ligaments Suboptimally assessed by CT. Muscles and Tendons The right hip muscles and tendons appear unremarkable. Soft tissues No significant periarticular hematoma. There is a small low-density fluid collection deep to the iliotibial band and lateral to the greater trochanter, measuring 14 mm in greatest thickness. The visualized internal pelvic contents appear unremarkable. There is mild right femoral atherosclerosis. IMPRESSION: Comminuted, angulated and moderately displaced fracture of the right femoral neck. Electronically Signed   By: Richardean Sale M.D.   On: 11/24/2018 12:55   Dg C-arm 1-60 Min-no Report  Result Date: 11/24/2018 Fluoroscopy was utilized by the requesting physician.  No radiographic interpretation.   Dg Hip Operative Unilat W Or W/o Pelvis Right  Result Date: 11/24/2018 CLINICAL DATA:  Right total hip arthroplasty EXAM: OPERATIVE RIGHT HIP (WITH PELVIS IF PERFORMED) two VIEWS TECHNIQUE: Fluoroscopic spot image(s) were submitted for interpretation post-operatively. COMPARISON:  None. FINDINGS: A total of 23 seconds of fluoroscopic time was utilized and 2 images acquired intraoperatively status post right hip arthroplasty. An uncemented right total hip arthroplasty is noted with postop change in the adjacent soft tissues. No hardware complication is identified. No fracture is apparent IMPRESSION: New right total hip arthroplasty without hardware complication. Electronically Signed   By: Ashley Royalty M.D.   On:  11/24/2018 16:03    Microbiology: No results found for this or any previous visit (from the past 240 hour(s)).   Labs: Basic Metabolic Panel: Recent Labs  Lab 11/24/18 1005 11/25/18 0509  NA 137 136  K 3.7 4.2  CL 99 100  CO2 28 26  GLUCOSE 212* 294*  BUN 24* 24*  CREATININE 1.06* 0.97  CALCIUM 9.6 8.6*  MG  --  2.0   Liver Function Tests: Recent Labs  Lab 11/24/18 1005 11/25/18 0509  AST 18 40  ALT 18 46*  ALKPHOS 118 99  BILITOT 0.9 0.6  PROT 7.7 6.0*  ALBUMIN 4.0 2.9*   No results for input(s): LIPASE, AMYLASE in the last 168 hours. No results for input(s): AMMONIA in the last 168 hours. CBC: Recent Labs  Lab 11/24/18 1005 11/25/18 0509  WBC 11.6* 12.7*  NEUTROABS 8.9*  --   HGB 13.0 10.4*  HCT 41.4 32.1*  MCV 90.8 90.4  PLT 299 210   Cardiac Enzymes: No results for input(s): CKTOTAL, CKMB, CKMBINDEX, TROPONINI in the last 168 hours. BNP: BNP (last 3 results) No results for input(s): BNP in the last 8760 hours.  ProBNP (last 3 results) No results for input(s): PROBNP in the last 8760 hours.  CBG: Recent Labs  Lab 11/25/18 1207 11/25/18 1800 11/25/18 2151 11/26/18 0200 11/26/18 0803  GLUCAP 249* 143* 112* 172* 161*       Signed:  Nita Sells MD   [facility] 11/26/2018, 8:47 AM

## 2018-11-26 NOTE — Progress Notes (Signed)
Pt stable at this time. No needs at this time. Pt home equipment at bedside. Pt did well with physical therapy and did stair training without complication. Pt to d/c home with family.

## 2018-11-27 ENCOUNTER — Encounter (HOSPITAL_COMMUNITY): Payer: Self-pay | Admitting: Orthopedic Surgery

## 2018-11-28 DIAGNOSIS — Z86718 Personal history of other venous thrombosis and embolism: Secondary | ICD-10-CM | POA: Diagnosis not present

## 2018-11-28 DIAGNOSIS — Z9181 History of falling: Secondary | ICD-10-CM | POA: Diagnosis not present

## 2018-11-28 DIAGNOSIS — S72041D Displaced fracture of base of neck of right femur, subsequent encounter for closed fracture with routine healing: Secondary | ICD-10-CM | POA: Diagnosis not present

## 2018-11-28 DIAGNOSIS — I10 Essential (primary) hypertension: Secondary | ICD-10-CM | POA: Diagnosis not present

## 2018-11-28 DIAGNOSIS — G4733 Obstructive sleep apnea (adult) (pediatric): Secondary | ICD-10-CM | POA: Diagnosis not present

## 2018-11-28 DIAGNOSIS — E782 Mixed hyperlipidemia: Secondary | ICD-10-CM | POA: Diagnosis not present

## 2018-11-28 DIAGNOSIS — E119 Type 2 diabetes mellitus without complications: Secondary | ICD-10-CM | POA: Diagnosis not present

## 2018-11-28 DIAGNOSIS — W19XXXD Unspecified fall, subsequent encounter: Secondary | ICD-10-CM | POA: Diagnosis not present

## 2018-11-28 DIAGNOSIS — Z96642 Presence of left artificial hip joint: Secondary | ICD-10-CM | POA: Diagnosis not present

## 2018-11-28 DIAGNOSIS — Z794 Long term (current) use of insulin: Secondary | ICD-10-CM | POA: Diagnosis not present

## 2018-11-28 DIAGNOSIS — Z7982 Long term (current) use of aspirin: Secondary | ICD-10-CM | POA: Diagnosis not present

## 2018-11-29 DIAGNOSIS — Z96642 Presence of left artificial hip joint: Secondary | ICD-10-CM | POA: Diagnosis not present

## 2018-11-29 DIAGNOSIS — I10 Essential (primary) hypertension: Secondary | ICD-10-CM | POA: Diagnosis not present

## 2018-11-29 DIAGNOSIS — E782 Mixed hyperlipidemia: Secondary | ICD-10-CM | POA: Diagnosis not present

## 2018-11-29 DIAGNOSIS — E119 Type 2 diabetes mellitus without complications: Secondary | ICD-10-CM | POA: Diagnosis not present

## 2018-11-29 DIAGNOSIS — G4733 Obstructive sleep apnea (adult) (pediatric): Secondary | ICD-10-CM | POA: Diagnosis not present

## 2018-11-29 DIAGNOSIS — S72041D Displaced fracture of base of neck of right femur, subsequent encounter for closed fracture with routine healing: Secondary | ICD-10-CM | POA: Diagnosis not present

## 2018-12-01 DIAGNOSIS — S72041D Displaced fracture of base of neck of right femur, subsequent encounter for closed fracture with routine healing: Secondary | ICD-10-CM | POA: Diagnosis not present

## 2018-12-01 DIAGNOSIS — E782 Mixed hyperlipidemia: Secondary | ICD-10-CM | POA: Diagnosis not present

## 2018-12-01 DIAGNOSIS — I10 Essential (primary) hypertension: Secondary | ICD-10-CM | POA: Diagnosis not present

## 2018-12-01 DIAGNOSIS — G4733 Obstructive sleep apnea (adult) (pediatric): Secondary | ICD-10-CM | POA: Diagnosis not present

## 2018-12-01 DIAGNOSIS — Z96642 Presence of left artificial hip joint: Secondary | ICD-10-CM | POA: Diagnosis not present

## 2018-12-01 DIAGNOSIS — E119 Type 2 diabetes mellitus without complications: Secondary | ICD-10-CM | POA: Diagnosis not present

## 2018-12-04 DIAGNOSIS — S72041D Displaced fracture of base of neck of right femur, subsequent encounter for closed fracture with routine healing: Secondary | ICD-10-CM | POA: Diagnosis not present

## 2018-12-04 DIAGNOSIS — E782 Mixed hyperlipidemia: Secondary | ICD-10-CM | POA: Diagnosis not present

## 2018-12-04 DIAGNOSIS — E119 Type 2 diabetes mellitus without complications: Secondary | ICD-10-CM | POA: Diagnosis not present

## 2018-12-04 DIAGNOSIS — G4733 Obstructive sleep apnea (adult) (pediatric): Secondary | ICD-10-CM | POA: Diagnosis not present

## 2018-12-04 DIAGNOSIS — I10 Essential (primary) hypertension: Secondary | ICD-10-CM | POA: Diagnosis not present

## 2018-12-04 DIAGNOSIS — Z96642 Presence of left artificial hip joint: Secondary | ICD-10-CM | POA: Diagnosis not present

## 2018-12-05 DIAGNOSIS — M25551 Pain in right hip: Secondary | ICD-10-CM | POA: Diagnosis not present

## 2018-12-06 DIAGNOSIS — Z96642 Presence of left artificial hip joint: Secondary | ICD-10-CM | POA: Diagnosis not present

## 2018-12-06 DIAGNOSIS — E119 Type 2 diabetes mellitus without complications: Secondary | ICD-10-CM | POA: Diagnosis not present

## 2018-12-06 DIAGNOSIS — G4733 Obstructive sleep apnea (adult) (pediatric): Secondary | ICD-10-CM | POA: Diagnosis not present

## 2018-12-06 DIAGNOSIS — I10 Essential (primary) hypertension: Secondary | ICD-10-CM | POA: Diagnosis not present

## 2018-12-06 DIAGNOSIS — E782 Mixed hyperlipidemia: Secondary | ICD-10-CM | POA: Diagnosis not present

## 2018-12-06 DIAGNOSIS — S72041D Displaced fracture of base of neck of right femur, subsequent encounter for closed fracture with routine healing: Secondary | ICD-10-CM | POA: Diagnosis not present

## 2018-12-08 DIAGNOSIS — S72041D Displaced fracture of base of neck of right femur, subsequent encounter for closed fracture with routine healing: Secondary | ICD-10-CM | POA: Diagnosis not present

## 2018-12-08 DIAGNOSIS — E119 Type 2 diabetes mellitus without complications: Secondary | ICD-10-CM | POA: Diagnosis not present

## 2018-12-08 DIAGNOSIS — I10 Essential (primary) hypertension: Secondary | ICD-10-CM | POA: Diagnosis not present

## 2018-12-08 DIAGNOSIS — Z96642 Presence of left artificial hip joint: Secondary | ICD-10-CM | POA: Diagnosis not present

## 2018-12-08 DIAGNOSIS — E782 Mixed hyperlipidemia: Secondary | ICD-10-CM | POA: Diagnosis not present

## 2018-12-08 DIAGNOSIS — G4733 Obstructive sleep apnea (adult) (pediatric): Secondary | ICD-10-CM | POA: Diagnosis not present

## 2018-12-11 DIAGNOSIS — S8001XD Contusion of right knee, subsequent encounter: Secondary | ICD-10-CM | POA: Diagnosis not present

## 2018-12-11 DIAGNOSIS — Z96641 Presence of right artificial hip joint: Secondary | ICD-10-CM | POA: Diagnosis not present

## 2018-12-13 DIAGNOSIS — S8001XD Contusion of right knee, subsequent encounter: Secondary | ICD-10-CM | POA: Diagnosis not present

## 2018-12-13 DIAGNOSIS — Z96641 Presence of right artificial hip joint: Secondary | ICD-10-CM | POA: Diagnosis not present

## 2018-12-18 DIAGNOSIS — Z96641 Presence of right artificial hip joint: Secondary | ICD-10-CM | POA: Diagnosis not present

## 2018-12-20 DIAGNOSIS — Z96641 Presence of right artificial hip joint: Secondary | ICD-10-CM | POA: Diagnosis not present

## 2018-12-20 DIAGNOSIS — S8001XD Contusion of right knee, subsequent encounter: Secondary | ICD-10-CM | POA: Diagnosis not present

## 2018-12-22 DIAGNOSIS — S8001XD Contusion of right knee, subsequent encounter: Secondary | ICD-10-CM | POA: Diagnosis not present

## 2018-12-22 DIAGNOSIS — Z96641 Presence of right artificial hip joint: Secondary | ICD-10-CM | POA: Diagnosis not present

## 2018-12-26 DIAGNOSIS — Z9889 Other specified postprocedural states: Secondary | ICD-10-CM | POA: Diagnosis not present

## 2018-12-26 DIAGNOSIS — S8001XD Contusion of right knee, subsequent encounter: Secondary | ICD-10-CM | POA: Diagnosis not present

## 2018-12-26 DIAGNOSIS — Z96641 Presence of right artificial hip joint: Secondary | ICD-10-CM | POA: Diagnosis not present

## 2019-01-24 ENCOUNTER — Telehealth: Payer: Self-pay | Admitting: *Deleted

## 2019-01-24 NOTE — Telephone Encounter (Signed)
Virtual Visit Pre-Appointment Phone Call  Steps For Call:  1. Confirm consent - "In the setting of the current Covid19 crisis, you are scheduled for a (phone or video) visit with your provider on (Friday.April 17) at (9:30am).  Just as we do with many in-office visits, in order for you to participate in this visit, we must obtain consent.  If you'd like, I can send this to your mychart (if signed up) or email for you to review.  Otherwise, I can obtain your verbal consent now.  All virtual visits are billed to your insurance company just like a normal visit would be.  By agreeing to a virtual visit, we'd like you to understand that the technology does not allow for your provider to perform an examination, and thus may limit your provider's ability to fully assess your condition.  Finally, though the technology is pretty good, we cannot assure that it will always work on either your or our end, and in the setting of a video visit, we may have to convert it to a phone-only visit.  In either situation, we cannot ensure that we have a secure connection.  Are you willing to proceed?" STAFF: Did the patient verbally acknowledge consent to telehealth visit? Document YES/NO here: YES  2. Confirm the BEST phone number to call the day of the visit by including in appointment notes  3. Give patient instructions for WebEx/MyChart download to smartphone as below or Doximity/Doxy.me if video visit (depending on what platform provider is using)  4. Advise patient to be prepared with their blood pressure, heart rate, weight, any heart rhythm information, their current medicines, and a piece of paper and pen handy for any instructions they may receive the day of their visit  5. Inform patient they will receive a phone call 15 minutes prior to their appointment time (may be from unknown caller ID) so they should be prepared to answer  6. Confirm that appointment type is correct in Epic appointment notes (VIDEO vs  PHONE)     TELEPHONE CALL NOTE  Alice Cole has been deemed a candidate for a follow-up tele-health visit to limit community exposure during the Covid-19 pandemic. I spoke with the patient via phone to ensure availability of phone/video source, confirm preferred email & phone number, and discuss instructions and expectations.  I reminded Alice Cole to be prepared with any vital sign and/or heart rhythm information that could potentially be obtained via home monitoring, at the time of her visit. I reminded Alice Cole to expect a phone call at the time of her visit if her visit.  Jatin Naumann Avanell Shackleton 01/24/2019 2:40 PM   INSTRUCTIONS FOR DOWNLOADING THE [place] APP TO SMARTPHONE  - If Apple, ask patient to go to App Store and type in WebEx in the search bar. [employer], the blue/green circle. If Android, go to [employer] and type in [employer] in the search bar. The app is free but as with any other app downloads, their phone may require them to verify saved payment information or Apple/Android password.  - The patient does NOT have to create an account. - On the day of the visit, the assist will walk the patient through joining the meeting with the meeting number/password.  INSTRUCTIONS FOR DOWNLOADING THE MYCHART APP TO SMARTPHONE  - The patient must first make sure to have activated MyChart and know their login information - If Apple, go to [employer] and type  in [place] in the search bar and download the app. If Android, ask patient to go to [employer] and type in [place] in the search bar and download the app. The app is free but as with any other app downloads, their phone may require them to verify saved payment information or Apple/Android password.  - The patient will need to then log into the app with their MyChart username and password, and select [facility] as their healthcare provider to link the account. When it is time for your visit, go to the  MyChart app, find appointments, and click Begin Video Visit. Be sure to Select Allow for your device to access the Microphone and Camera for your visit. You will then be connected, and your provider will be with you shortly.  **If they have any issues connecting, or need assistance please contact MyChart service desk [phone])**  **If using a computer, in order to ensure the best quality for their visit they will need to use either of the following Internet Browsers: [employer], or Google Chrome**  IF USING DOXIMITY or DOXY.ME - The patient will receive a link just prior to their visit, either by text or email (to be determined day of appointment depending on if it's doxy.me or Doximity).     FULL LENGTH CONSENT FOR TELE-HEALTH VISIT   I hereby voluntarily request, consent and authorize [facility] and its employed or contracted physicians, physician assistants, nurse practitioners or other licensed health care professionals (the Practitioner), to provide me with telemedicine health care services (the "Services") as deemed necessary by the treating Practitioner. I acknowledge and consent to receive the Services by the Practitioner via telemedicine. I understand that the telemedicine visit will involve communicating with the Practitioner through live audiovisual communication technology and the disclosure of certain medical information by electronic transmission. I acknowledge that I have been given the opportunity to request an in-person assessment or other available alternative prior to the telemedicine visit and am voluntarily participating in the telemedicine visit.  I understand that I have the right to withhold or withdraw my consent to the use of telemedicine in the course of my care at any time, without affecting my right to future care or treatment, and that the Practitioner or I may terminate the telemedicine visit at any time. I understand that I have the right to  inspect all information obtained and/or recorded in the course of the telemedicine visit and may receive copies of available information for a reasonable fee.  I understand that some of the potential risks of receiving the Services via telemedicine include:  Marland Kitchen Delay or interruption in medical evaluation due to technological equipment failure or disruption; . Information transmitted may not be sufficient (e.g. poor resolution of images) to allow for appropriate medical decision making by the Practitioner; and/or  . In rare instances, security protocols could fail, causing a breach of personal health information.  Furthermore, I acknowledge that it is my responsibility to provide information about my medical history, conditions and care that is complete and accurate to the best of my ability. I acknowledge that Practitioner's advice, recommendations, and/or decision may be based on factors not within their control, such as incomplete or inaccurate data provided by me or distortions of diagnostic images or specimens that may result from electronic transmissions. I understand that the practice of medicine is not an exact science and that Practitioner makes no warranties or guarantees regarding treatment outcomes. I acknowledge that I will receive a  copy of this consent concurrently upon execution via email to the email address I last provided but may also request a printed copy by calling the office of [facility].    I understand that my insurance will be billed for this visit.   I have read or had this consent read to me. . I understand the contents of this consent, which adequately explains the benefits and risks of the Services being provided via telemedicine.  . I have been provided ample opportunity to ask questions regarding this consent and the Services and have had my questions answered to my satisfaction. . I give my informed consent for the services to be provided through the use of  telemedicine in my medical care  By participating in this telemedicine visit I agree to the above.

## 2019-01-25 DIAGNOSIS — M1711 Unilateral primary osteoarthritis, right knee: Secondary | ICD-10-CM | POA: Diagnosis not present

## 2019-01-25 DIAGNOSIS — Z471 Aftercare following joint replacement surgery: Secondary | ICD-10-CM | POA: Diagnosis not present

## 2019-01-26 ENCOUNTER — Encounter: Payer: Self-pay | Admitting: Cardiology

## 2019-01-26 ENCOUNTER — Other Ambulatory Visit: Payer: Self-pay

## 2019-01-26 ENCOUNTER — Telehealth (INDEPENDENT_AMBULATORY_CARE_PROVIDER_SITE_OTHER): Payer: Medicare Other | Admitting: Cardiology

## 2019-01-26 VITALS — BP 147/78 | HR 88 | Ht 67.0 in | Wt 193.0 lb

## 2019-01-26 DIAGNOSIS — G4733 Obstructive sleep apnea (adult) (pediatric): Secondary | ICD-10-CM | POA: Diagnosis not present

## 2019-01-26 DIAGNOSIS — I493 Ventricular premature depolarization: Secondary | ICD-10-CM | POA: Diagnosis not present

## 2019-01-26 DIAGNOSIS — Z7189 Other specified counseling: Secondary | ICD-10-CM | POA: Diagnosis not present

## 2019-01-26 DIAGNOSIS — I10 Essential (primary) hypertension: Secondary | ICD-10-CM

## 2019-01-26 NOTE — Patient Instructions (Signed)
Medication Instructions:  Your physician recommends that you continue on your current medications as directed. Please refer to the Current Medication list given to you today.  If you need a refill on your cardiac medications before your next appointment, please call your pharmacy.   Lab work: None If you have labs (blood work) drawn today and your tests are completely normal, you will receive your results only by: . MyChart Message (if you have MyChart) OR . A paper copy in the mail If you have any lab test that is abnormal or we need to change your treatment, we will call you to review the results.  Testing/Procedures: None  Follow-Up: At [facility], you and your health needs are our priority.  As part of our continuing mission to provide you with exceptional heart care, we have created designated Provider Care Teams.  These Care Teams include your primary Cardiologist (physician) and Advanced Practice Providers (APPs -  Physician Assistants and Nurse Practitioners) who all work together to provide you with the care you need, when you need it. You will need a follow up appointment in 1 years.  Please call our office 2 months in advance to schedule this appointment.  You may see Traci Turner, MD or one of the following Advanced Practice Providers on your designated Care Team:   Brittainy Simmons, PA-C Dayna Dunn, PA-C . Michele Lenze, PA-C   

## 2019-01-26 NOTE — Progress Notes (Signed)
Virtual Visit via Video Note   This visit type was conducted due to national recommendations for restrictions regarding the COVID-19 Pandemic (e.g. social distancing) in an effort to limit this patient's exposure and mitigate transmission in our community.  Due to her co-morbid illnesses, this patient is at least at moderate risk for complications without adequate follow up.  This format is felt to be most appropriate for this patient at this time.  All issues noted in this document were discussed and addressed.  A limited physical exam was performed with this format.  Please refer to the patient's chart for her consent to telehealth for [facility].  Evaluation Performed:  Follow-up visit  This visit type was conducted due to national recommendations for restrictions regarding the COVID-19 Pandemic (e.g. social distancing).  This format is felt to be most appropriate for this patient at this time.  All issues noted in this document were discussed and addressed.  No physical exam was performed (except for noted visual exam findings with Video Visits).  Please refer to the patient's chart (MyChart message for video visits and phone note for telephone visits) for the patient's consent to telehealth for [facility].  Date:  01/26/2019   ID:  Alice Cole, DOB 31-Jan-1946, MRN [id number]  Patient Location:  Home  Provider location:   [place]  PCP:  Maurice Small, MD  Cardiologist:  Fransico Him, MD  Electrophysiologist:  None   Chief Complaint:  OSA, HTN, PVCs, diastolic dysfunction  History of Present Illness:    Alice Cole is a 73 y.o. female who presents via audio/video conferencing for a telehealth visit today.    Alice Cole is a 76K.o. female with a hx of OSA/PVCs and HTN.  She has a history of OSA but does not use CPAP on a regular basis. She has a history of PVCs and normal coronary arteries by cath in 2003. Nuclear stress test showed no ischemiain 2017and 2D echo showed  normal LVF with diastolic dysfunction.  She has chronic DOE felt secondary to obesity, poorly controlled hypertension, deconditioning and DD. Cardizem was increased to 360 mg daily buther insurance wouldn't pay for. She was seen in the hypertension clinic the following week blood pressure was much better controlled. PFTs were abnormal with mild to moderate restriction in she was referred to pulmonary.High-resolution CT 02/2017 showed no evidence of interstitial lung disease and tiny 2-3 mm pulmonary nodules on the right. She was referred to pulmonary rehabilitation.   She had not been using her CPAP a year ago due to feeling like it was not staying clean and requested a new device.  She underwent repeat sleep study 11/2017 showing no significant OSA overall with an AHI of 3.7/hr and moderate OSA during REM sleep with an AHi of 24/hr.  Her insurance would not pay for CPAP since her overall AHI was < 5 despite an elevated AHI during REM sleep.  She has not really had any problems with excessive daytime sleepiness and is sleeping fine at night.   She is here today for followup and is doing well.  She fell and fractured her hip earlier in the winter and had to have surgery but has recovered well.  She denies any chest pain or pressure,  PND, orthopnea, LE edema, dizziness, palpitations or syncope. She has chronic DOE which has improved after completing pulmonary rehab.  She is compliant with her meds and is tolerating meds with no SE.      The  patient does not have symptoms concerning for COVID-19 infection (fever, chills, cough, or new shortness of breath).    Prior CV studies:   The following studies were reviewed today:  PAP download  Past Medical History:  Diagnosis Date  . Abnormal EKG 07/09/2016  . Abnormal liver function   . Adenomatous colon polyp   . Arthritis   . Bigeminy   . Colonic polyp   . Dermatitis   . Diabetes mellitus   . Diverticulitis    recurrent  . DVT (deep venous  thrombosis) ([place])   . Esophageal stricture   . GERD (gastroesophageal reflux disease)   . Hemorrhoids   . Hyperlipidemia   . Hypertension   . OSA (obstructive sleep apnea) 07/09/2016   She has not been using her CPAP device because she needs new supplies  . PVC's (premature ventricular contractions)    Past Surgical History:  Procedure Laterality Date  . ABDOMINAL HYSTERECTOMY    . CHOLECYSTECTOMY     aprox 10 years ago  . EYE SURGERY     right and left eye  . KNEE SURGERY     Right  . KNEE SURGERY     Left  . TOTAL HIP ARTHROPLASTY Right 11/24/2018   Procedure: TOTAL HIP ARTHROPLASTY ANTERIOR APPROACH;  Surgeon: Dorna Leitz, MD;  Location: [facility];  Service: Orthopedics;  Laterality: Right;     Current Meds  Medication Sig  . acetaminophen (TYLENOL) 500 MG tablet Take 1,000 mg by mouth every 6 (six) hours as needed for moderate pain or headache.  . ALPRAZolam (XANAX) 0.25 MG tablet Take 0.125-0.25 mg by mouth daily as needed for anxiety.  Marland Kitchen aspirin 81 MG tablet Take 81 mg by mouth daily.  Marland Kitchen diltiazem (CARDIZEM CD) 360 MG 24 hr capsule TAKE 1 CAPSULE (360 MG TOTAL) BY MOUTH DAILY.  Marland Kitchen docusate sodium (COLACE) 100 MG capsule Take 1 capsule (100 mg total) by mouth 2 (two) times daily.  . hydrochlorothiazide (HYDRODIURIL) 25 MG tablet Take 25 mg by mouth daily.  . insulin aspart (NOVOLOG) 100 UNIT/ML injection Inject 42-54 Units into the skin See admin instructions. Inject 42 units at breakfast and at lunch and 44-55 units at supper  . omeprazole (PRILOSEC) 20 MG capsule Take 20 mg by mouth daily.  . pravastatin (PRAVACHOL) 40 MG tablet Take 40 mg by mouth at bedtime.   . ramipril (ALTACE) 10 MG capsule Take 10 mg by mouth 2 (two) times daily.     Allergies:   Metoprolol; Nortriptyline; Olmesartan; Tradjenta [linagliptin]; Alatrofloxacin; Beta adrenergic blockers; Cephalexin; Codeine; Crestor [rosuvastatin calcium]; Flagyl [metronidazole]; Fluvastatin sodium; Levemir [insulin  detemir]; Lisinopril; Sulfamethoxazole-trimethoprim; Verapamil; Welchol [colesevelam hcl]; Adhesive [tape]; Sitagliptin; and Trovan [trovafloxacin]   Social History   Tobacco Use  . Smoking status: Never Smoker  . Smokeless tobacco: Never Used  Substance Use Topics  . Alcohol use: No    Comment: used to drink 30 yrs ago  . Drug use: No     Family Hx: The patient's family history includes Cancer in her daughter; Diabetes in an other family member; Heart attack (age of onset: 63) in her mother; Heart attack (age of onset: 38) in her maternal grandfather; Heart attack (age of onset: 28) in her maternal grandmother; Heart attack (age of onset: 20) in her brother; Heart disease in an other family member.  ROS:   Please see the history of present illness.     All other systems reviewed and are negative.   Labs/Other Tests and  Data Reviewed:    Recent Labs: 11/25/2018: ALT 46; BUN 24; Creatinine, Ser 0.97; Magnesium 2.0; Potassium 4.2; Sodium 136 11/26/2018: Hemoglobin 11.1; Platelets 281   Recent Lipid Panel Lab Results  Component Value Date/Time   CHOL  08/25/2010 04:04 AM    194        ATP III CLASSIFICATION:  <200     mg/dL   Desirable  200-239  mg/dL   Borderline High  >=240    mg/dL   High          TRIG 175 (H) 08/25/2010 04:04 AM   HDL 38 (L) 08/25/2010 04:04 AM   CHOLHDL 5.1 08/25/2010 04:04 AM   LDLCALC (H) 08/25/2010 04:04 AM    121        Total Cholesterol/HDL:CHD Risk Coronary Heart Disease Risk Table                     Men   Women  1/2 Average Risk   3.4   3.3  Average Risk       5.0   4.4  2 X Average Risk   9.6   7.1  3 X Average Risk  23.4   11.0        Use the calculated Patient Ratio above and the CHD Risk Table to determine the patient's CHD Risk.        ATP III CLASSIFICATION (LDL):  <100     mg/dL   Optimal  100-129  mg/dL   Near or Above                    Optimal  130-159  mg/dL   Borderline  160-189  mg/dL   High  >190     mg/dL   Very High     Wt Readings from Last 3 Encounters:  01/26/19 193 lb (87.5 kg)  11/24/18 192 lb 0.3 oz (87.1 kg)  11/21/18 192 lb (87.1 kg)     Objective:    Vital Signs:  BP (!) 147/78 (BP Location: Left Arm)   Pulse 88   Ht 5\' 7"  (1.702 m)   Wt 193 lb (87.5 kg)   BMI 30.23 kg/m    CONSTITUTIONAL:  Well nourished, well developed female in no acute distress.  EYES: anicteric MOUTH: oral mucosa is pink RESPIRATORY: Normal respiratory effort, symmetric expansion CARDIOVASCULAR: No peripheral edema SKIN: No rash, lesions or ulcers MUSCULOSKELETAL: no digital cyanosis NEURO: Cranial Nerves II-XII grossly intact, moves all extremities PSYCH: Intact judgement and insight.  A&O x 3, Mood/affect appropriate   ASSESSMENT & PLAN:    1.  PVCs - these are well suppress on Cardizem.  She has not had any palpitations since I saw her last.   2.  HTN  - she checks her BP at home and is has been fairly well controlled.  It is a little higher than usual today.  She will continue on Cardizem CD 360mg  daily, HCTZ 25mg  daily and Altace 10mg  BID.  Her creatinine was 0.97 on 11/2018.  I reminded her to try to follow a low sodium diet.   3.  OSA - she did not qualify for CPAP again after her last PSG due to AHI of < 5/hr despite a high AHI during REM sleep and insurance would not pay for a device.  She is sleeping fine and does not really have any daytime sleepiness.    4.  COVID-19 Education:The signs and symptoms of COVID-19 were discussed  with the patient and how to seek care for testing (follow up with PCP or arrange E-visit).  The importance of social distancing was discussed today.  Patient Risk:   After full review of this patient's clinical status, I feel that they are at least moderate risk at this time.  Time:   Today, I have spent 15 minutes directly with the patient on video discussing medical problems including palpitations, management of HTN and low sodium diet and good sleep hygiene.  We also  reviewed the symptoms of COVID 19 and the ways to protect against contracting the virus with telehealth technology.  I spent an additional 10 minutes reviewing patient's chart including reviewing her PSG study and CPAP titration earlier this year as well as recent labword.  Medication Adjustments/Labs and Tests Ordered: Current medicines are reviewed at length with the patient today.  Concerns regarding medicines are outlined above.  Tests Ordered: No orders of the defined types were placed in this encounter.  Medication Changes: No orders of the defined types were placed in this encounter.   Disposition:  Follow up in 1 year(s)  Signed, Fransico Him, MD  01/26/2019 9:26 AM    [facility]

## 2019-01-29 DIAGNOSIS — S72001D Fracture of unspecified part of neck of right femur, subsequent encounter for closed fracture with routine healing: Secondary | ICD-10-CM | POA: Diagnosis not present

## 2019-01-29 DIAGNOSIS — M810 Age-related osteoporosis without current pathological fracture: Secondary | ICD-10-CM | POA: Diagnosis not present

## 2019-01-29 DIAGNOSIS — E1165 Type 2 diabetes mellitus with hyperglycemia: Secondary | ICD-10-CM | POA: Diagnosis not present

## 2019-01-29 DIAGNOSIS — Z794 Long term (current) use of insulin: Secondary | ICD-10-CM | POA: Diagnosis not present

## 2019-01-29 DIAGNOSIS — Z5181 Encounter for therapeutic drug level monitoring: Secondary | ICD-10-CM | POA: Diagnosis not present

## 2019-02-14 ENCOUNTER — Other Ambulatory Visit: Payer: Self-pay | Admitting: Physician Assistant

## 2019-03-15 DIAGNOSIS — K5792 Diverticulitis of intestine, part unspecified, without perforation or abscess without bleeding: Secondary | ICD-10-CM | POA: Diagnosis not present

## 2019-03-30 DIAGNOSIS — L304 Erythema intertrigo: Secondary | ICD-10-CM | POA: Diagnosis not present

## 2019-04-24 ENCOUNTER — Other Ambulatory Visit: Payer: Self-pay | Admitting: *Deleted

## 2019-04-24 DIAGNOSIS — Z20822 Contact with and (suspected) exposure to covid-19: Secondary | ICD-10-CM

## 2019-04-29 LAB — NOVEL CORONAVIRUS, NAA: SARS-CoV-2, NAA: NOT DETECTED

## 2019-04-30 DIAGNOSIS — M810 Age-related osteoporosis without current pathological fracture: Secondary | ICD-10-CM | POA: Diagnosis not present

## 2019-04-30 DIAGNOSIS — E1165 Type 2 diabetes mellitus with hyperglycemia: Secondary | ICD-10-CM | POA: Diagnosis not present

## 2019-04-30 DIAGNOSIS — Z794 Long term (current) use of insulin: Secondary | ICD-10-CM | POA: Diagnosis not present

## 2019-04-30 DIAGNOSIS — Z5181 Encounter for therapeutic drug level monitoring: Secondary | ICD-10-CM | POA: Diagnosis not present

## 2019-06-07 DIAGNOSIS — R0781 Pleurodynia: Secondary | ICD-10-CM | POA: Diagnosis not present

## 2019-06-08 ENCOUNTER — Ambulatory Visit
Admission: RE | Admit: 2019-06-08 | Discharge: 2019-06-08 | Disposition: A | Discharge status: Home / Self Care | Payer: Medicare Other | Source: Ambulatory Visit | Attending: Family Medicine | Admitting: Family Medicine

## 2019-06-08 ENCOUNTER — Other Ambulatory Visit: Payer: Self-pay | Admitting: Family Medicine

## 2019-06-08 DIAGNOSIS — R0781 Pleurodynia: Secondary | ICD-10-CM | POA: Diagnosis not present

## 2019-06-22 DIAGNOSIS — Z20828 Contact with and (suspected) exposure to other viral communicable diseases: Secondary | ICD-10-CM | POA: Diagnosis not present

## 2019-07-03 DIAGNOSIS — Z23 Encounter for immunization: Secondary | ICD-10-CM | POA: Diagnosis not present

## 2019-09-19 DIAGNOSIS — Z5181 Encounter for therapeutic drug level monitoring: Secondary | ICD-10-CM | POA: Diagnosis not present

## 2019-09-19 DIAGNOSIS — E1165 Type 2 diabetes mellitus with hyperglycemia: Secondary | ICD-10-CM | POA: Diagnosis not present

## 2019-09-19 DIAGNOSIS — Z794 Long term (current) use of insulin: Secondary | ICD-10-CM | POA: Diagnosis not present

## 2019-09-19 DIAGNOSIS — M810 Age-related osteoporosis without current pathological fracture: Secondary | ICD-10-CM | POA: Diagnosis not present

## 2019-09-21 ENCOUNTER — Encounter: Payer: Self-pay | Admitting: Podiatry

## 2019-09-21 ENCOUNTER — Ambulatory Visit (INDEPENDENT_AMBULATORY_CARE_PROVIDER_SITE_OTHER): Payer: Medicare Other | Admitting: Podiatry

## 2019-09-21 ENCOUNTER — Other Ambulatory Visit: Payer: Self-pay

## 2019-09-21 DIAGNOSIS — M79674 Pain in right toe(s): Secondary | ICD-10-CM | POA: Diagnosis not present

## 2019-09-21 DIAGNOSIS — M79675 Pain in left toe(s): Secondary | ICD-10-CM

## 2019-09-21 DIAGNOSIS — B351 Tinea unguium: Secondary | ICD-10-CM

## 2019-09-21 NOTE — Progress Notes (Signed)
Subjective:   Patient ID: Alice Cole, female   DOB: 73 y.o.   MRN: [id number]   HPI Patient presents with thick brittle nailbeds 1-5 both feet that are incurvated and painful for this patient and cannot take care of with the right hallux been especially worse now   ROS      Objective:  Physical Exam  Mycotic nail infection with thick yellow brittle debris 1-5 both feet that she cannot take care of with at risk Co. morbidities     Assessment:  Pain to palpation of all nailbeds with thick yellow brittle debris and no active drainage noted     Plan:  Debridement of nailbeds 1-5 both feet with no iatrogenic bleeding and reappoint for routine care Dr. Prudence Davidson

## 2019-10-09 ENCOUNTER — Ambulatory Visit: Payer: Medicare Other | Attending: Internal Medicine

## 2019-10-09 DIAGNOSIS — Z20822 Contact with and (suspected) exposure to covid-19: Secondary | ICD-10-CM

## 2019-10-10 LAB — NOVEL CORONAVIRUS, NAA: SARS-CoV-2, NAA: NOT DETECTED

## 2019-10-15 IMAGING — DX RIGHT RIBS AND CHEST - 3+ VIEW
3 series · 3 of 3 positions shown · non-contrast
Comparison: 11/24/2018

CLINICAL DATA: Right posterior rib pain.  No known injury.

EXAM:
RIGHT RIBS AND CHEST - 3+ VIEW

[dg ribs unilateral w/chest right (1 of 3)]
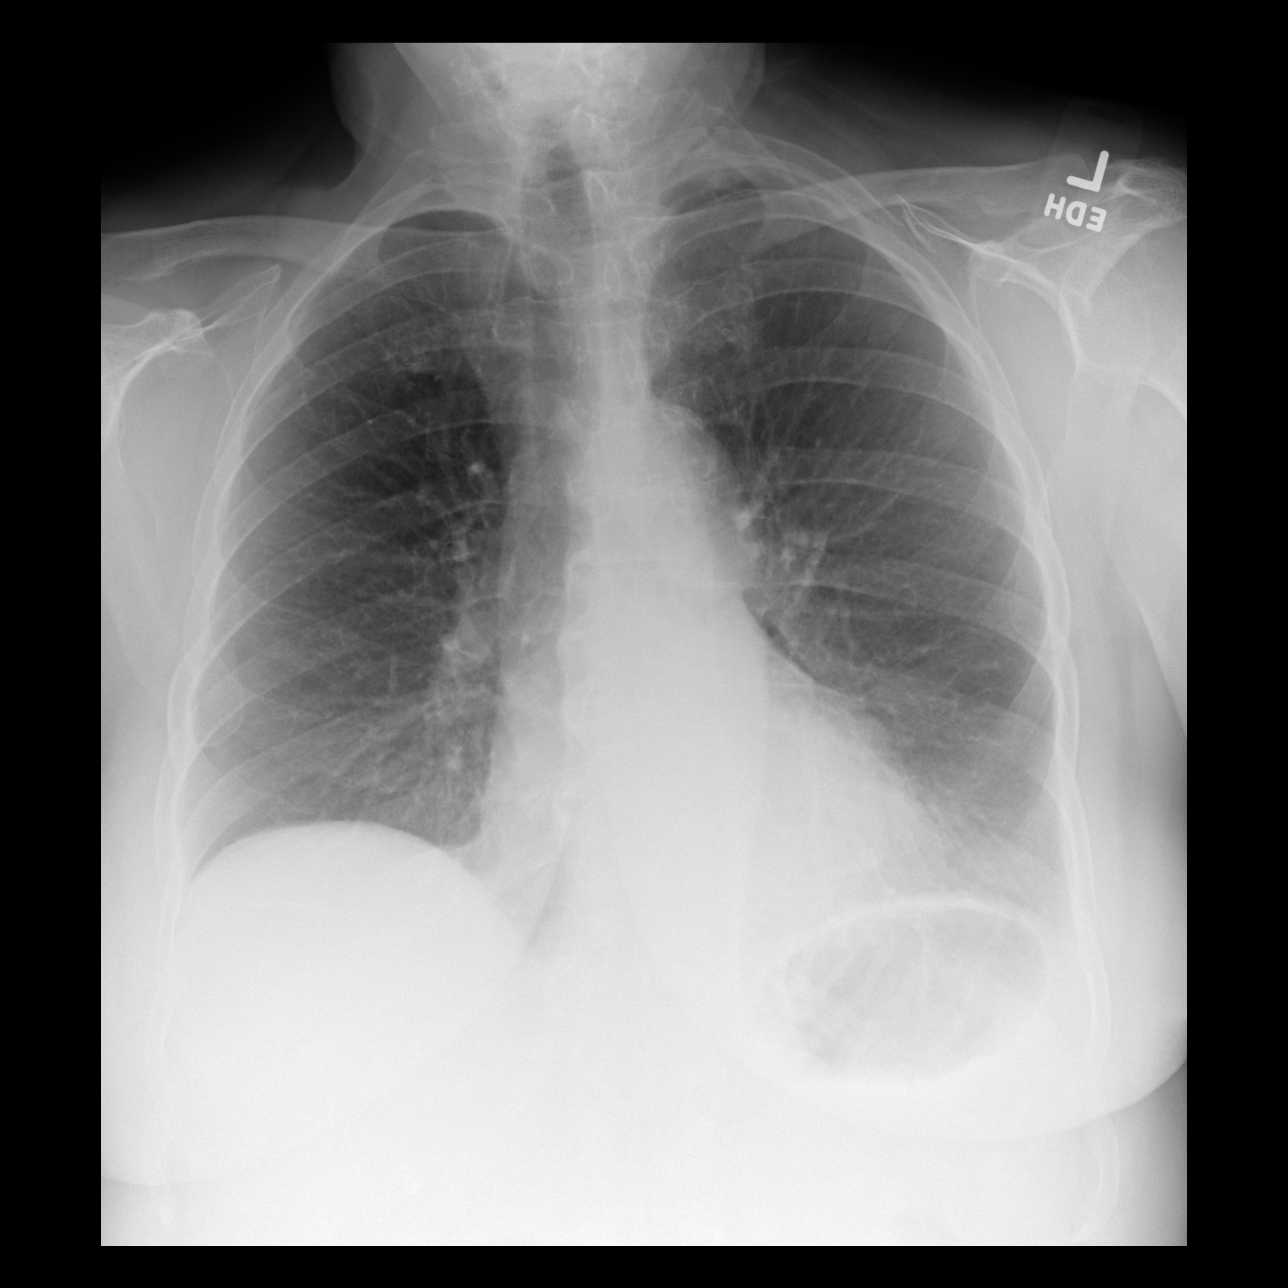

[dg ribs unilateral w/chest right (2 of 3)]
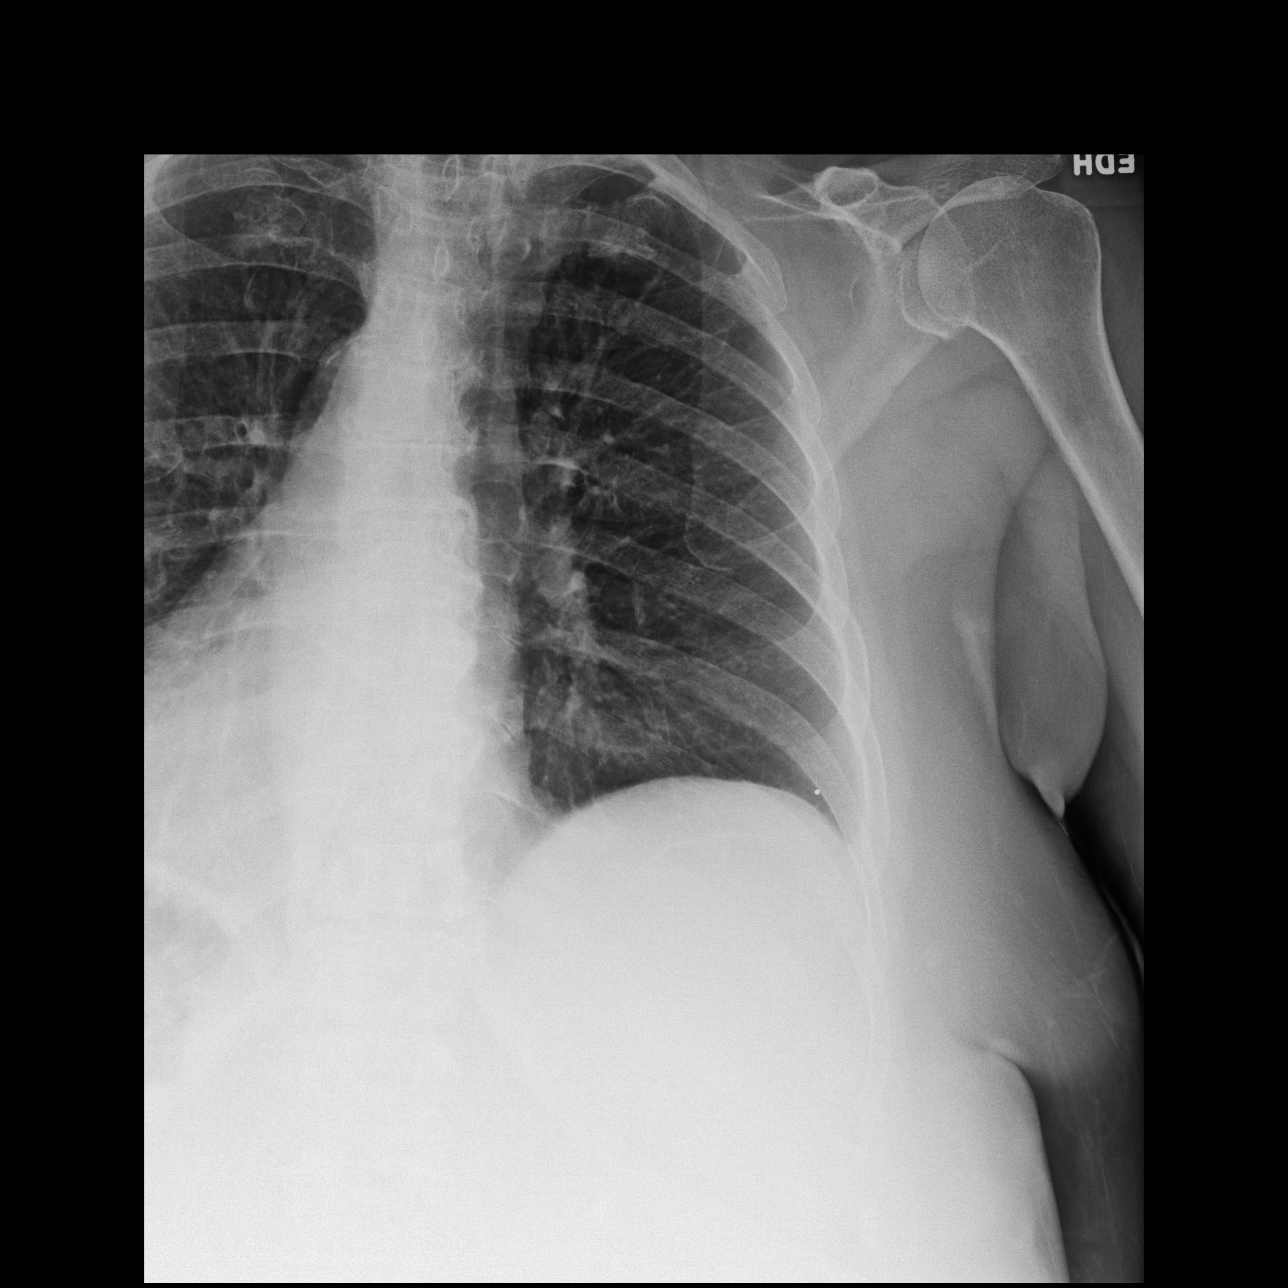

[dg ribs unilateral w/chest right (3 of 3)]
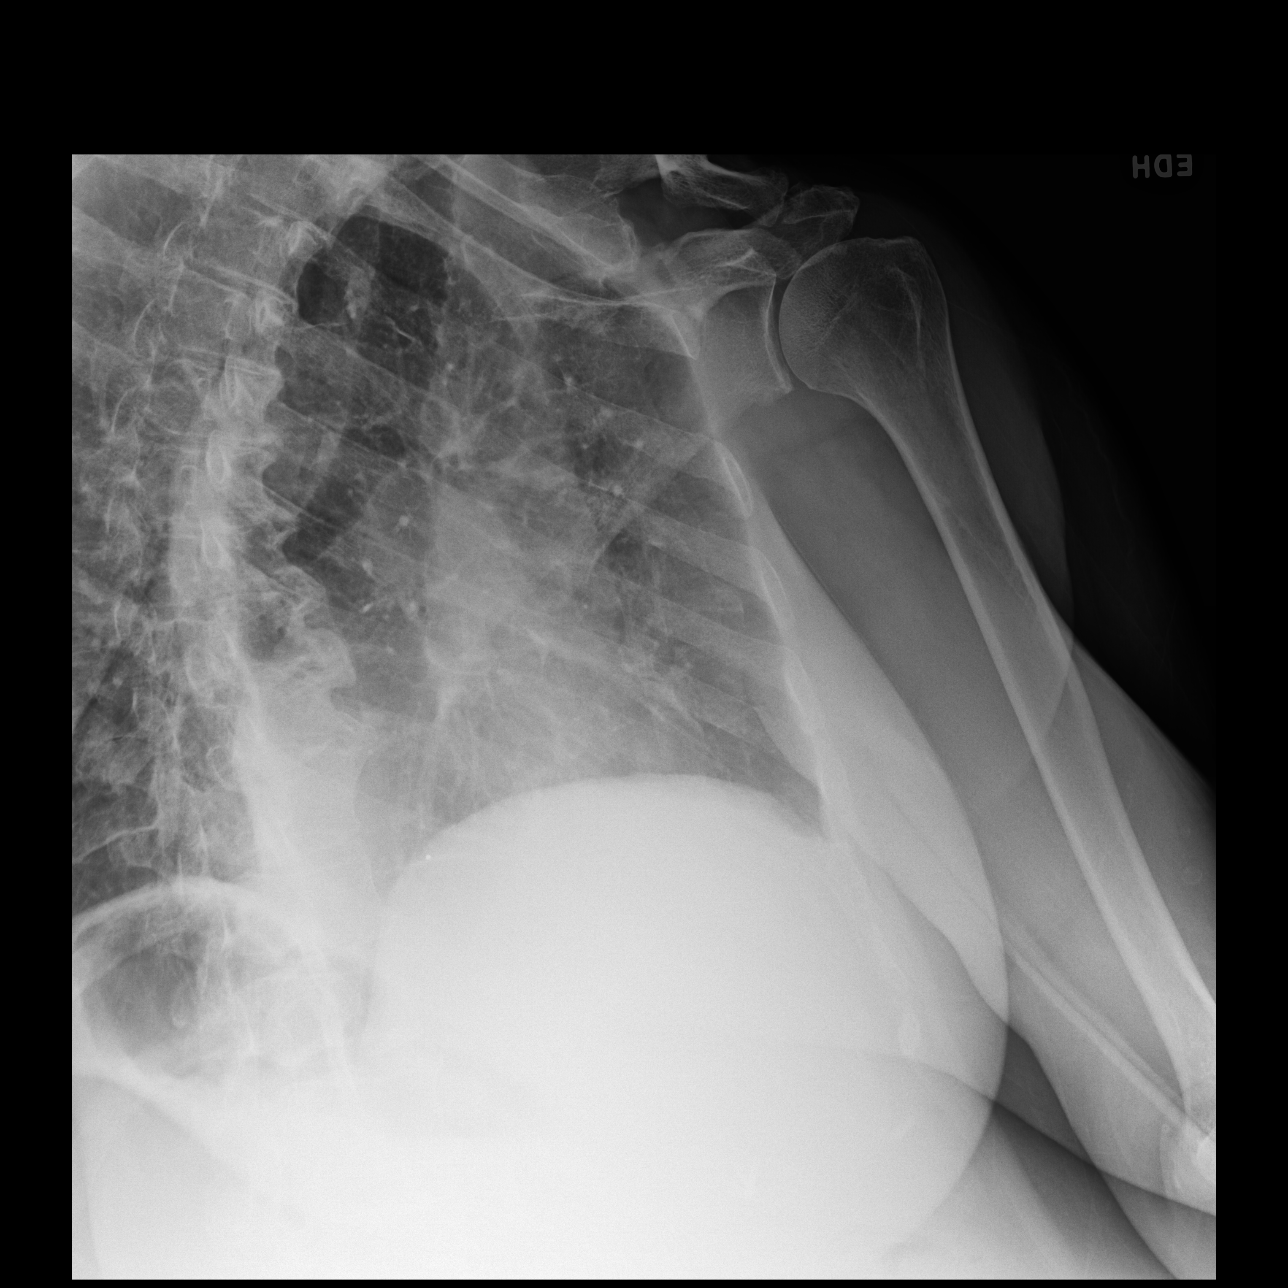

[3 of 3 positions shown; findings below may reference images not displayed]

FINDINGS: No fracture or other bone lesions are seen involving the ribs. There
is no evidence of pneumothorax or pleural effusion. Both lungs are
clear. Heart size and mediastinal contours are within normal limits.
IMPRESSION: Negative.

## 2019-10-17 ENCOUNTER — Ambulatory Visit: Payer: Medicare Other | Admitting: *Deleted

## 2019-11-02 ENCOUNTER — Ambulatory Visit: Payer: Medicare Other | Admitting: Podiatry

## 2019-11-23 ENCOUNTER — Ambulatory Visit: Payer: Medicare Other | Admitting: Podiatry

## 2019-12-14 ENCOUNTER — Ambulatory Visit: Payer: Medicare Other | Admitting: Podiatry

## 2019-12-19 DIAGNOSIS — Z7189 Other specified counseling: Secondary | ICD-10-CM | POA: Diagnosis not present

## 2019-12-19 DIAGNOSIS — M810 Age-related osteoporosis without current pathological fracture: Secondary | ICD-10-CM | POA: Diagnosis not present

## 2019-12-19 DIAGNOSIS — E119 Type 2 diabetes mellitus without complications: Secondary | ICD-10-CM | POA: Diagnosis not present

## 2019-12-19 DIAGNOSIS — Z794 Long term (current) use of insulin: Secondary | ICD-10-CM | POA: Diagnosis not present

## 2019-12-19 DIAGNOSIS — Z5181 Encounter for therapeutic drug level monitoring: Secondary | ICD-10-CM | POA: Diagnosis not present

## 2020-01-08 DIAGNOSIS — R1032 Left lower quadrant pain: Secondary | ICD-10-CM | POA: Diagnosis not present

## 2020-01-22 DIAGNOSIS — K589 Irritable bowel syndrome without diarrhea: Secondary | ICD-10-CM | POA: Diagnosis not present

## 2020-01-28 ENCOUNTER — Encounter: Payer: Self-pay | Admitting: Cardiology

## 2020-01-28 ENCOUNTER — Ambulatory Visit (INDEPENDENT_AMBULATORY_CARE_PROVIDER_SITE_OTHER): Payer: Medicare Other | Admitting: Cardiology

## 2020-01-28 ENCOUNTER — Other Ambulatory Visit: Payer: Self-pay

## 2020-01-28 VITALS — BP 142/70 | HR 98 | Ht 67.0 in | Wt 196.4 lb

## 2020-01-28 DIAGNOSIS — I10 Essential (primary) hypertension: Secondary | ICD-10-CM

## 2020-01-28 DIAGNOSIS — I493 Ventricular premature depolarization: Secondary | ICD-10-CM | POA: Diagnosis not present

## 2020-01-28 DIAGNOSIS — G4733 Obstructive sleep apnea (adult) (pediatric): Secondary | ICD-10-CM | POA: Diagnosis not present

## 2020-01-28 MED ORDER — DILTIAZEM HCL ER COATED BEADS 360 MG PO CP24: ORAL_CAPSULE | ORAL | 3 refills | Status: DC

## 2020-01-28 NOTE — Addendum Note (Signed)
Addended by: Antonieta Iba on: 01/28/2020 11:21 AM   Modules accepted: Orders

## 2020-01-28 NOTE — Patient Instructions (Signed)

## 2020-01-28 NOTE — Progress Notes (Signed)
Cardiology Office Note:    Date:  01/28/2020   ID:  Alice Cole, DOB 12/15/1945, MRN [id number]  PCP:  Maurice Small, MD  Cardiologist:  Fransico Him, MD    Referring MD: Maurice Small, MD   Chief Complaint  Patient presents with  . Follow-up    PVCs    History of Present Illness:    Alice Cole is a 74 y.o. female with a hx of OSA/PVCs and HTN.She has a history of PVCs and normal coronary arteries by cath in 2003. Nuclear stress test showed no ischemiain 2017and 2D echo showed normal LVF with diastolic dysfunction.She has chronic DOE felt secondary to obesity, poorly controlled hypertension, deconditioning and DD. PFTs were abnormal with mild to moderate restriction and she was referred to pulmonary.High-resolution CT 02/2017 showed no evidence of interstitial lung disease and tiny 2-3 mm pulmonary nodules on the right. She was referred to pulmonary rehabilitation.  She underwent repeat sleep study 11/2017 due to non compliance with her PAP and this showed no significant OSA overall with an AHI of 3.7/hr and moderate OSA during REM sleep with an [facility] of 24/hr.  Her insurance would not pay for CPAP since her overall AHI was < 5 despite an elevated AHI during REM sleep.  She has not really had any problems with excessive daytime sleepiness and is sleeping fine at night.   She is here today for followup and is doing well.  She has chronic DOE which is stable and not changed since I saw her last.  She denies any chest pain or pressure, PND, orthopnea, LE edema, dizziness, palpitations or syncope. She is compliant with her meds and is tolerating meds with no SE.    Past Medical History:  Diagnosis Date  . Abnormal EKG 07/09/2016  . Abnormal liver function   . Adenomatous colon polyp   . Arthritis   . Bigeminy   . Colonic polyp   . Dermatitis   . Diabetes mellitus   . Diverticulitis    recurrent  . DVT (deep venous thrombosis) ([place])   . Esophageal stricture   . GERD  (gastroesophageal reflux disease)   . Hemorrhoids   . Hyperlipidemia   . Hypertension   . OSA (obstructive sleep apnea) 07/09/2016   She has not been using her CPAP device because she needs new supplies  . PVC's (premature ventricular contractions)     Past Surgical History:  Procedure Laterality Date  . ABDOMINAL HYSTERECTOMY    . CHOLECYSTECTOMY     aprox 10 years ago  . EYE SURGERY     right and left eye  . KNEE SURGERY     Right  . KNEE SURGERY     Left  . TOTAL HIP ARTHROPLASTY Right 11/24/2018   Procedure: TOTAL HIP ARTHROPLASTY ANTERIOR APPROACH;  Surgeon: Dorna Leitz, MD;  Location: [facility];  Service: Orthopedics;  Laterality: Right;    Current Medications: Current Meds  Medication Sig  . acetaminophen (TYLENOL) 500 MG tablet Take 1,000 mg by mouth every 6 (six) hours as needed for moderate pain or headache.  . alendronate (FOSAMAX) 70 MG tablet   . ALPRAZolam (XANAX) 0.25 MG tablet Take 0.125-0.25 mg by mouth daily as needed for anxiety.  Marland Kitchen aspirin 81 MG tablet Take 81 mg by mouth daily.  . BD PEN NEEDLE NANO U/F 32G X 4 MM MISC   . cholecalciferol (VITAMIN D3) 25 MCG (1000 UT) tablet Take 1,000 Units by mouth daily.  Marland Kitchen diltiazem (CARDIZEM  CD) 360 MG 24 hr capsule TAKE 1 CAPSULE BY MOUTH EVERY DAY  . hydrochlorothiazide (HYDRODIURIL) 25 MG tablet Take 25 mg by mouth daily.  . insulin aspart (NOVOLOG) 100 UNIT/ML injection Inject 42-54 Units into the skin See admin instructions. Inject 42 units at breakfast and at lunch and 44-55 units at supper  . nystatin cream (MYCOSTATIN)   . omeprazole (PRILOSEC) 20 MG capsule Take 20 mg by mouth daily.  . pravastatin (PRAVACHOL) 40 MG tablet Take 40 mg by mouth at bedtime.   . ramipril (ALTACE) 10 MG capsule Take 10 mg by mouth 2 (two) times daily.     Allergies:   Metoprolol, Nortriptyline, Olmesartan, Tradjenta [linagliptin], Alatrofloxacin, Beta adrenergic blockers, Cephalexin, Codeine, Crestor [rosuvastatin calcium], Flagyl  [metronidazole], Fluvastatin sodium, Levemir [insulin detemir], Lisinopril, Sulfamethoxazole-trimethoprim, Verapamil, Welchol [colesevelam hcl], Adhesive [tape], Sitagliptin, and Trovan [trovafloxacin]   Social History   Socioeconomic History  . Marital status: Married    Spouse name: Not on file  . Number of children: 2  . Years of education: 71  . Highest education level: Not on file  Occupational History  . Occupation: Retired  Tobacco Use  . Smoking status: Never Smoker  . Smokeless tobacco: Never Used  Substance and Sexual Activity  . Alcohol use: No    Comment: used to drink 30 yrs ago  . Drug use: No  . Sexual activity: Not on file  Other Topics Concern  . Not on file  Social History Narrative   Regular exercise-yes   Caffeine use-yes   Lives with Husband and 3 graqdnsons and one is adopted   Husband drinks a lot-her husband drinks and smokes a lot   Used to be be married and is currently divorced   Daughters are on drugs   Social Determinants of Radio broadcast assistant Strain:   . Difficulty of Paying Living Expenses:   Food Insecurity:   . Worried About Charity fundraiser in the Last Year:   . Arboriculturist in the Last Year:   Transportation Needs:   . Film/video editor (Medical):   Marland Kitchen Lack of Transportation (Non-Medical):   Physical Activity:   . Days of Exercise per Week:   . Minutes of Exercise per Session:   Stress:   . Feeling of Stress :   Social Connections:   . Frequency of Communication with Friends and Family:   . Frequency of Social Gatherings with Friends and Family:   . Attends Religious Services:   . Active Member of Clubs or Organizations:   . Attends Archivist Meetings:   Marland Kitchen Marital Status:      Family History: The patient's family history includes Cancer in her daughter; Diabetes in an other family member; Heart attack (age of onset: 90) in her mother; Heart attack (age of onset: 29) in her maternal grandfather;  Heart attack (age of onset: 30) in her maternal grandmother; Heart attack (age of onset: 67) in her brother; Heart disease in an other family member.  ROS:   Please see the history of present illness.    ROS  All other systems reviewed and negative.   EKGs/Labs/Other Studies Reviewed:    The following studies were reviewed today: Outside labs from PCP  EKG:  EKG is  ordered today.  The ekg ordered today demonstrates NSR with inferior and anterior inarct - no change from 11/2018  Recent Labs: No results found for requested labs within last 8760 hours.  Recent Lipid Panel    Component Value Date/Time   CHOL  08/25/2010 0404    194        ATP III CLASSIFICATION:  <200     mg/dL   Desirable  200-239  mg/dL   Borderline High  >=240    mg/dL   High          TRIG 175 (H) 08/25/2010 0404   HDL 38 (L) 08/25/2010 0404   CHOLHDL 5.1 08/25/2010 0404   VLDL 35 08/25/2010 0404   LDLCALC (H) 08/25/2010 0404    121        Total Cholesterol/HDL:CHD Risk Coronary Heart Disease Risk Table                     Men   Women  1/2 Average Risk   3.4   3.3  Average Risk       5.0   4.4  2 X Average Risk   9.6   7.1  3 X Average Risk  23.4   11.0        Use the calculated Patient Ratio above and the CHD Risk Table to determine the patient's CHD Risk.        ATP III CLASSIFICATION (LDL):  <100     mg/dL   Optimal  100-129  mg/dL   Near or Above                    Optimal  130-159  mg/dL   Borderline  160-189  mg/dL   High  >190     mg/dL   Very High    Physical Exam:    VS:  BP (!) 142/70   Pulse 98   Ht 5\' 7"  (1.702 m)   Wt 196 lb 6.4 oz (89.1 kg)   BMI 30.76 kg/m     Wt Readings from Last 3 Encounters:  01/28/20 196 lb 6.4 oz (89.1 kg)  01/26/19 193 lb (87.5 kg)  11/24/18 192 lb 0.3 oz (87.1 kg)     GEN:  Well nourished, well developed in no acute distress HEENT: Normal NECK: No JVD; No carotid bruits LYMPHATICS: No lymphadenopathy CARDIAC: RRR, no murmurs, rubs,  gallops RESPIRATORY:  Clear to auscultation without rales, wheezing or rhonchi  ABDOMEN: Soft, non-tender, non-distended MUSCULOSKELETAL:  No edema; No deformity  SKIN: Warm and dry NEUROLOGIC:  Alert and oriented x 3 PSYCHIATRIC:  Normal affect   ASSESSMENT:    1. Premature ventricular contraction   2. Essential hypertension, benign   3. OSA (obstructive sleep apnea)    PLAN:    In order of problems listed above:  1.  PVCs -she denies any palpitations and PVCs are well suppressed on CCB -continue Carvedizem CD 360mg  daily  2.  HTN   -BP well controlled on exam today -continue Cardizem CD 360mg  daily, HCTZ 25mg  daily and Altace 10mg  BID -Creatinine was stable at 1.09 and K+ 3.9 in Dec 2020  3.  OSA -  she did not qualify for CPAP again after her last PSG due to AHI of < 5/hr despite a high AHI during REM sleep and insurance would not pay for a device.   -She is sleeping fine and does not really have any daytime sleepiness.      Medication Adjustments/Labs and Tests Ordered: Current medicines are reviewed at length with the patient today.  Concerns regarding medicines are outlined above.  Orders Placed This Encounter  Procedures  . EKG  12-Lead   No orders of the defined types were placed in this encounter.   Signed, Fransico Him, MD  01/28/2020 11:02 AM    [facility]

## 2020-01-30 DIAGNOSIS — K589 Irritable bowel syndrome without diarrhea: Secondary | ICD-10-CM | POA: Diagnosis not present

## 2020-02-01 DIAGNOSIS — M79602 Pain in left arm: Secondary | ICD-10-CM | POA: Diagnosis not present

## 2020-02-08 ENCOUNTER — Ambulatory Visit: Payer: Medicare Other | Admitting: Podiatry

## 2020-05-09 DIAGNOSIS — E119 Type 2 diabetes mellitus without complications: Secondary | ICD-10-CM | POA: Diagnosis not present

## 2020-05-09 DIAGNOSIS — Z794 Long term (current) use of insulin: Secondary | ICD-10-CM | POA: Diagnosis not present

## 2020-05-09 DIAGNOSIS — M810 Age-related osteoporosis without current pathological fracture: Secondary | ICD-10-CM | POA: Diagnosis not present

## 2020-06-26 DIAGNOSIS — M25562 Pain in left knee: Secondary | ICD-10-CM | POA: Diagnosis not present

## 2020-06-26 DIAGNOSIS — M1712 Unilateral primary osteoarthritis, left knee: Secondary | ICD-10-CM | POA: Diagnosis not present

## 2020-06-26 DIAGNOSIS — M1711 Unilateral primary osteoarthritis, right knee: Secondary | ICD-10-CM | POA: Diagnosis not present

## 2020-06-26 DIAGNOSIS — M25561 Pain in right knee: Secondary | ICD-10-CM | POA: Diagnosis not present

## 2020-07-18 DIAGNOSIS — Z23 Encounter for immunization: Secondary | ICD-10-CM | POA: Diagnosis not present

## 2020-09-11 DIAGNOSIS — M5412 Radiculopathy, cervical region: Secondary | ICD-10-CM | POA: Diagnosis not present

## 2020-09-15 ENCOUNTER — Other Ambulatory Visit: Payer: Self-pay | Admitting: Family Medicine

## 2020-09-15 DIAGNOSIS — M5412 Radiculopathy, cervical region: Secondary | ICD-10-CM

## 2020-09-26 ENCOUNTER — Telehealth: Payer: Self-pay | Admitting: Cardiology

## 2020-09-26 NOTE — Telephone Encounter (Signed)
Advised patient to call her PCP

## 2020-09-26 NOTE — Telephone Encounter (Signed)
New message:    Patient calling stating that she has the [place] shot, patient states that on the news they are saying not to get a booster shot of J&J

## 2020-10-08 ENCOUNTER — Other Ambulatory Visit: Payer: Medicare Other

## 2020-10-24 DIAGNOSIS — Z23 Encounter for immunization: Secondary | ICD-10-CM | POA: Diagnosis not present

## 2020-11-01 ENCOUNTER — Other Ambulatory Visit: Payer: Medicare Other

## 2020-11-27 DIAGNOSIS — E1165 Type 2 diabetes mellitus with hyperglycemia: Secondary | ICD-10-CM | POA: Diagnosis not present

## 2020-11-27 DIAGNOSIS — M810 Age-related osteoporosis without current pathological fracture: Secondary | ICD-10-CM | POA: Diagnosis not present

## 2020-11-27 DIAGNOSIS — Z794 Long term (current) use of insulin: Secondary | ICD-10-CM | POA: Diagnosis not present

## 2020-12-15 ENCOUNTER — Other Ambulatory Visit: Payer: Self-pay | Admitting: Cardiology

## 2020-12-15 DIAGNOSIS — Z8619 Personal history of other infectious and parasitic diseases: Secondary | ICD-10-CM | POA: Diagnosis not present

## 2020-12-15 DIAGNOSIS — Z86010 Personal history of colonic polyps: Secondary | ICD-10-CM | POA: Diagnosis not present

## 2020-12-15 DIAGNOSIS — K760 Fatty (change of) liver, not elsewhere classified: Secondary | ICD-10-CM | POA: Diagnosis not present

## 2020-12-15 DIAGNOSIS — K589 Irritable bowel syndrome without diarrhea: Secondary | ICD-10-CM | POA: Diagnosis not present

## 2020-12-15 DIAGNOSIS — K5900 Constipation, unspecified: Secondary | ICD-10-CM | POA: Diagnosis not present

## 2020-12-15 DIAGNOSIS — R1013 Epigastric pain: Secondary | ICD-10-CM | POA: Diagnosis not present

## 2020-12-18 DIAGNOSIS — N289 Disorder of kidney and ureter, unspecified: Secondary | ICD-10-CM | POA: Diagnosis not present

## 2021-02-02 NOTE — Progress Notes (Signed)
Cardiology Office Note:    Date:  02/03/2021   ID:  Alice Cole, DOB 1946-07-17, MRN [id number]  PCP:  Maurice Small, MD  Cardiologist:  Fransico Him, MD    Referring MD: Maurice Small, MD   Chief Complaint  Patient presents with  . Follow-up    PVCs and HTN    History of Present Illness:    Alice Cole is a 75 y.o. female with a hx of OSA/PVCs and HTN.She has a history of PVCs and normal coronary arteries by cath in 2003. Nuclear stress test showed no ischemiain 2017and 2D echo showed normal LVF with diastolic dysfunction.She has chronic DOE felt secondary to obesity, poorly controlled hypertension, deconditioning and DD. PFTs were abnormal with mild to moderate restriction and she was referred to pulmonary.High-resolution CT 02/2017 showed no evidence of interstitial lung disease and tiny 2-3 mm pulmonary nodules on the right. She was referred to pulmonary rehabilitation.  She underwent repeat sleep study 11/2017 due to non compliance with her PAP and this showed no significant OSA overall with an AHI of 3.7/hr and moderate OSA during REM sleep with an [facility] of 24/hr.  Her insurance would not pay for CPAP since her overall AHI was < 5 despite an elevated AHI during REM sleep.  She has not really had any problems with excessive daytime sleepiness and is sleeping fine at night.   SHe is here today for followup and is doing well.  SHe denies any chest pain or pressure,  PND, orthopnea, dizziness, palpitations or syncope.  She has chronic DOE which she thinks has gotten worse recently but has gained over 20lbs since COVID.  She also has more fatigue.  She also has been having LE edema.  She does admit to salting her food.   Past Medical History:  Diagnosis Date  . Abnormal EKG 07/09/2016  . Abnormal liver function   . Adenomatous colon polyp   . Arthritis   . Bigeminy   . Colonic polyp   . Dermatitis   . Diabetes mellitus   . Diverticulitis    recurrent  . DVT (deep venous  thrombosis) ([place])   . Esophageal stricture   . GERD (gastroesophageal reflux disease)   . Hemorrhoids   . Hyperlipidemia   . Hypertension   . OSA (obstructive sleep apnea) 07/09/2016   last PSG showed no significant OSA  . PVC's (premature ventricular contractions)     Past Surgical History:  Procedure Laterality Date  . ABDOMINAL HYSTERECTOMY    . CHOLECYSTECTOMY     aprox 10 years ago  . EYE SURGERY     right and left eye  . KNEE SURGERY     Right  . KNEE SURGERY     Left  . TOTAL HIP ARTHROPLASTY Right 11/24/2018   Procedure: TOTAL HIP ARTHROPLASTY ANTERIOR APPROACH;  Surgeon: Dorna Leitz, MD;  Location: [facility];  Service: Orthopedics;  Laterality: Right;    Current Medications: Current Meds  Medication Sig  . acetaminophen (TYLENOL) 500 MG tablet Take 1,000 mg by mouth every 6 (six) hours as needed for moderate pain or headache.  . ALPRAZolam (XANAX) 0.25 MG tablet Take 0.125-0.25 mg by mouth daily as needed for anxiety.  Marland Kitchen aspirin 81 MG tablet Take 81 mg by mouth daily.  . BD PEN NEEDLE NANO U/F 32G X 4 MM MISC   . cholecalciferol (VITAMIN D3) 25 MCG (1000 UT) tablet Take 1,000 Units by mouth daily.  Marland Kitchen diltiazem (CARDIZEM CD) 360 MG  24 hr capsule TAKE 1 CAPSULE BY MOUTH EVERY DAY  . hydrochlorothiazide (HYDRODIURIL) 25 MG tablet Take 25 mg by mouth daily.  . insulin aspart (NOVOLOG) 100 UNIT/ML injection Inject 42-54 Units into the skin See admin instructions. Inject 42 units at breakfast and at lunch and 44-55 units at supper  . omeprazole (PRILOSEC) 20 MG capsule Take 20 mg by mouth daily.  . pravastatin (PRAVACHOL) 40 MG tablet Take 40 mg by mouth at bedtime.   . ramipril (ALTACE) 10 MG capsule Take 10 mg by mouth 2 (two) times daily.     Allergies:   Metoprolol, Nortriptyline, Olmesartan, Tradjenta [linagliptin], Alatrofloxacin, Beta adrenergic blockers, Cephalexin, Codeine, Crestor [rosuvastatin calcium], Flagyl [metronidazole], Fluvastatin sodium, Levemir [insulin  detemir], Lisinopril, Sulfamethoxazole-trimethoprim, Verapamil, Welchol [colesevelam hcl], Adhesive [tape], Sitagliptin, and Trovan [trovafloxacin]   Social History   Socioeconomic History  . Marital status: Married    Spouse name: Not on file  . Number of children: 2  . Years of education: 20  . Highest education level: Not on file  Occupational History  . Occupation: Retired  Tobacco Use  . Smoking status: Never Smoker  . Smokeless tobacco: Never Used  Vaping Use  . Vaping Use: Never used  Substance and Sexual Activity  . Alcohol use: No    Comment: used to drink 30 yrs ago  . Drug use: No  . Sexual activity: Not on file  Other Topics Concern  . Not on file  Social History Narrative   Regular exercise-yes   Caffeine use-yes   Lives with Husband and 3 graqdnsons and one is adopted   Husband drinks a lot-her husband drinks and smokes a lot   Used to be be married and is currently divorced   Daughters are on drugs   Social Determinants of Radio broadcast assistant Strain: Not on file  Food Insecurity: Not on file  Transportation Needs: Not on file  Physical Activity: Not on file  Stress: Not on file  Social Connections: Not on file     Family History: The patient's family history includes Cancer in her daughter; Diabetes in an other family member; Heart attack (age of onset: 13) in her mother; Heart attack (age of onset: 1) in her maternal grandfather; Heart attack (age of onset: 34) in her maternal grandmother; Heart attack (age of onset: 27) in her brother; Heart disease in an other family member.  ROS:   Please see the history of present illness.    ROS  All other systems reviewed and negative.   EKGs/Labs/Other Studies Reviewed:    The following studies were reviewed today: Outside labs from PCP  EKG:  EKG is  ordered today.  The ekg ordered today demonstrates NSR with inferior and anterior inarct - no change from 11/2018  Recent Labs: No results found  for requested labs within last 8760 hours.   Recent Lipid Panel    Component Value Date/Time   CHOL  08/25/2010 0404    194        ATP III CLASSIFICATION:  <200     mg/dL   Desirable  200-239  mg/dL   Borderline High  >=240    mg/dL   High          TRIG 175 (H) 08/25/2010 0404   HDL 38 (L) 08/25/2010 0404   CHOLHDL 5.1 08/25/2010 0404   VLDL 35 08/25/2010 0404   LDLCALC (H) 08/25/2010 0404    121  Total Cholesterol/HDL:CHD Risk Coronary Heart Disease Risk Table                     Men   Women  1/2 Average Risk   3.4   3.3  Average Risk       5.0   4.4  2 X Average Risk   9.6   7.1  3 X Average Risk  23.4   11.0        Use the calculated Patient Ratio above and the CHD Risk Table to determine the patient's CHD Risk.        ATP III CLASSIFICATION (LDL):  <100     mg/dL   Optimal  100-129  mg/dL   Near or Above                    Optimal  130-159  mg/dL   Borderline  160-189  mg/dL   High  >190     mg/dL   Very High    Physical Exam:    VS:  BP 140/76   Pulse 83   Ht 5\' 7"  (1.702 m)   Wt 208 lb 9.6 oz (94.6 kg)   BMI 32.67 kg/m     Wt Readings from Last 3 Encounters:  02/03/21 208 lb 9.6 oz (94.6 kg)  01/28/20 196 lb 6.4 oz (89.1 kg)  01/26/19 193 lb (87.5 kg)     GEN: Well nourished, well developed in no acute distress HEENT: Normal NECK: No JVD; No carotid bruits LYMPHATICS: No lymphadenopathy CARDIAC:RRR, no murmurs, rubs, gallops RESPIRATORY:  Clear to auscultation without rales, wheezing or rhonchi  ABDOMEN: Soft, non-tender, non-distended MUSCULOSKELETAL:  No edema; No deformity  SKIN: Warm and dry NEUROLOGIC:  Alert and oriented x 3 PSYCHIATRIC:  Normal affect    ASSESSMENT:    1. Premature ventricular contraction   2. Essential hypertension, benign   3. OSA (obstructive sleep apnea)   4. DOE (dyspnea on exertion)    PLAN:    In order of problems listed above:  1.  PVCs -she has not had any palpitations since I saw her last and  she tolerates the Cardizem without any problems -continue Cardizem CD 360mg  daily  2.  HTN   -BP is adequately controlled on exam today -continue Cardizem CD 360mg  daily, HCTZ 25mg  daily and Altace 10mg  BID -SCr stable at 1.060 and K+ 4.3 in Feb 2022  3.  OSA -  she did not qualify for CPAP again after her last PSG due to AHI of < 5/hr despite a high AHI during REM sleep and insurance would not pay for a device.   -She is sleeping fine and does not really have any daytime sleepiness.    4.  DOE -I suspect this is related to weight gain and sedentary state with COVID 19  -I will repeat a 2D echo to make sure LVF is stable given her LE edema as well -I will also get a Lexiscan myoview to rule out ischemia.  -Shared Decision Making/Informed Consent The risks [chest pain, shortness of breath, cardiac arrhythmias, dizziness, blood pressure fluctuations, myocardial infarction, stroke/transient ischemic attack, nausea, vomiting, allergic reaction, radiation exposure, metallic taste sensation and life-threatening complications (estimated to be 1 in 10,000)], benefits (risk stratification, diagnosing coronary artery disease, treatment guidance) and alternatives of a nuclear stress test were discussed in detail with Ms. Jeremiah and she agrees to proceed.  5.  LE edema -this is sporadic and likely related  to dietary indiscretion with Na -encouraged her to avoid added table salt   Medication Adjustments/Labs and Tests Ordered: Current medicines are reviewed at length with the patient today.  Concerns regarding medicines are outlined above.  Orders Placed This Encounter  Procedures  . EKG 12-Lead   No orders of the defined types were placed in this encounter.   Signed, Fransico Him, MD  02/03/2021 9:24 AM    [facility]

## 2021-02-03 ENCOUNTER — Other Ambulatory Visit: Payer: Self-pay

## 2021-02-03 ENCOUNTER — Ambulatory Visit (INDEPENDENT_AMBULATORY_CARE_PROVIDER_SITE_OTHER): Payer: HMO | Admitting: Cardiology

## 2021-02-03 ENCOUNTER — Encounter: Payer: Self-pay | Admitting: Cardiology

## 2021-02-03 VITALS — BP 140/76 | HR 83 | Ht 67.0 in | Wt 208.6 lb

## 2021-02-03 DIAGNOSIS — R0609 Other forms of dyspnea: Secondary | ICD-10-CM

## 2021-02-03 DIAGNOSIS — R0600 Dyspnea, unspecified: Secondary | ICD-10-CM

## 2021-02-03 DIAGNOSIS — R600 Localized edema: Secondary | ICD-10-CM

## 2021-02-03 DIAGNOSIS — I493 Ventricular premature depolarization: Secondary | ICD-10-CM

## 2021-02-03 DIAGNOSIS — G4733 Obstructive sleep apnea (adult) (pediatric): Secondary | ICD-10-CM | POA: Diagnosis not present

## 2021-02-03 DIAGNOSIS — I10 Essential (primary) hypertension: Secondary | ICD-10-CM | POA: Diagnosis not present

## 2021-02-03 NOTE — Patient Instructions (Signed)
Medication Instructions:  Your physician recommends that you continue on your current medications as directed. Please refer to the Current Medication list given to you today.  *If you need a refill on your cardiac medications before your next appointment, please call your pharmacy*  Testing/Procedures: Your physician has requested that you have an echocardiogram. Echocardiography is a painless test that uses sound waves to create images of your heart. It provides your doctor with information about the size and shape of your heart and how well your heart's chambers and valves are working. This procedure takes approximately one hour. There are no restrictions for this procedure.  Your physician has requested that you have a lexiscan myoview. For further information please visit [email or web address]. Please follow instruction sheet, as given.  Follow-Up: At [facility], you and your health needs are our priority.  As part of our continuing mission to provide you with exceptional heart care, we have created designated Provider Care Teams.  These Care Teams include your primary Cardiologist (physician) and Advanced Practice Providers (APPs -  Physician Assistants and Nurse Practitioners) who all work together to provide you with the care you need, when you need it.  Your next appointment:   1 year(s)  The format for your next appointment:   In Person  Provider:   You may see Traci Turner, MD or one of the following Advanced Practice Providers on your designated Care Team:   Dayna Dunn, PA-C Michele Lenze, PA-C  

## 2021-02-03 NOTE — Addendum Note (Signed)
Addended by: Antonieta Iba on: 02/03/2021 09:28 AM   Modules accepted: Orders

## 2021-02-03 NOTE — Addendum Note (Signed)
Addended by: Fransico Him R on: 02/03/2021 10:22 AM   Modules accepted: Orders

## 2021-02-11 DIAGNOSIS — H2513 Age-related nuclear cataract, bilateral: Secondary | ICD-10-CM | POA: Diagnosis not present

## 2021-02-11 DIAGNOSIS — E119 Type 2 diabetes mellitus without complications: Secondary | ICD-10-CM | POA: Diagnosis not present

## 2021-02-26 ENCOUNTER — Telehealth (HOSPITAL_COMMUNITY): Payer: Self-pay | Admitting: *Deleted

## 2021-02-26 DIAGNOSIS — Z794 Long term (current) use of insulin: Secondary | ICD-10-CM | POA: Diagnosis not present

## 2021-02-26 DIAGNOSIS — E1165 Type 2 diabetes mellitus with hyperglycemia: Secondary | ICD-10-CM | POA: Diagnosis not present

## 2021-02-26 DIAGNOSIS — M810 Age-related osteoporosis without current pathological fracture: Secondary | ICD-10-CM | POA: Diagnosis not present

## 2021-02-26 DIAGNOSIS — E1136 Type 2 diabetes mellitus with diabetic cataract: Secondary | ICD-10-CM | POA: Diagnosis not present

## 2021-02-26 NOTE — Telephone Encounter (Signed)
Left message on voicemail per DPR in reference to upcoming appointment scheduled on 03/04/21 with detailed instructions given per Myocardial Perfusion Study Information Sheet for the test. LM to arrive 15 minutes early, and that it is imperative to arrive on time for appointment to keep from having the test rescheduled. If you need to cancel or reschedule your appointment, please call the office within 24 hours of your appointment. Failure to do so may result in a cancellation of your appointment, and a $50 no show fee. Phone number given for call back for any questions. Alice Cole [name]    

## 2021-03-03 ENCOUNTER — Telehealth: Payer: Self-pay | Admitting: Cardiology

## 2021-03-03 NOTE — Telephone Encounter (Signed)
Patient states she thinks she has a bladder infection, because she is going to the bathroom every 30 minutes and it hurts. She would like to know if it would still be okay for her to have her stress test and echo tomorrow. She states she thinks she will be able to manage it, but is not sure if it would effect the tests.

## 2021-03-03 NOTE — Telephone Encounter (Signed)
Spoke with the patient who states that she thinks she has a bladder infection. She reports urinary frequency as well as pain with urination. She states that she has called her PCP and they were not able to see her today. She would like to know about getting her echocardiogram and stress test done tomorrow. I advised her with her frequent urination it would be best to reschedule. Advised the patient to call her PCP back to see if they can see her tomorrow. Patient verbalized understanding.

## 2021-03-04 ENCOUNTER — Other Ambulatory Visit (HOSPITAL_COMMUNITY): Payer: HMO

## 2021-03-04 ENCOUNTER — Encounter (HOSPITAL_COMMUNITY): Payer: HMO

## 2021-03-05 DIAGNOSIS — R399 Unspecified symptoms and signs involving the genitourinary system: Secondary | ICD-10-CM | POA: Diagnosis not present

## 2021-03-20 DIAGNOSIS — R109 Unspecified abdominal pain: Secondary | ICD-10-CM | POA: Diagnosis not present

## 2021-03-24 ENCOUNTER — Telehealth (HOSPITAL_COMMUNITY): Payer: Self-pay | Admitting: *Deleted

## 2021-03-24 DIAGNOSIS — H2512 Age-related nuclear cataract, left eye: Secondary | ICD-10-CM | POA: Diagnosis not present

## 2021-03-24 DIAGNOSIS — Z961 Presence of intraocular lens: Secondary | ICD-10-CM | POA: Diagnosis not present

## 2021-03-24 DIAGNOSIS — H25012 Cortical age-related cataract, left eye: Secondary | ICD-10-CM | POA: Diagnosis not present

## 2021-03-24 DIAGNOSIS — H40013 Open angle with borderline findings, low risk, bilateral: Secondary | ICD-10-CM | POA: Diagnosis not present

## 2021-03-24 DIAGNOSIS — H18413 Arcus senilis, bilateral: Secondary | ICD-10-CM | POA: Diagnosis not present

## 2021-03-24 NOTE — Telephone Encounter (Signed)
Left message on voicemail per DPR in reference to upcoming appointment scheduled on 04/01/21 with detailed instructions given per Myocardial Perfusion Study Information Sheet for the test. LM to arrive 15 minutes early, and that it is imperative to arrive on time for appointment to keep from having the test rescheduled. If you need to cancel or reschedule your appointment, please call the office within 24 hours of your appointment. Failure to do so may result in a cancellation of your appointment, and a $50 no show fee. Phone number given for call back for any questions. Kirstie Peri

## 2021-03-30 DIAGNOSIS — R399 Unspecified symptoms and signs involving the genitourinary system: Secondary | ICD-10-CM | POA: Diagnosis not present

## 2021-04-01 ENCOUNTER — Ambulatory Visit (HOSPITAL_BASED_OUTPATIENT_CLINIC_OR_DEPARTMENT_OTHER): Payer: HMO

## 2021-04-01 ENCOUNTER — Ambulatory Visit (HOSPITAL_COMMUNITY): Payer: HMO | Attending: Cardiology

## 2021-04-01 ENCOUNTER — Telehealth: Payer: Self-pay

## 2021-04-01 ENCOUNTER — Other Ambulatory Visit: Payer: Self-pay

## 2021-04-01 DIAGNOSIS — R0600 Dyspnea, unspecified: Secondary | ICD-10-CM | POA: Insufficient documentation

## 2021-04-01 DIAGNOSIS — R0609 Other forms of dyspnea: Secondary | ICD-10-CM

## 2021-04-01 LAB — ECHOCARDIOGRAM COMPLETE
Area-P 1/2: 3.48 cm2
Height: 67 in
S' Lateral: 1.9 cm
Weight: 3328 oz

## 2021-04-01 LAB — MYOCARDIAL PERFUSION IMAGING
LV dias vol: 52 mL (ref 46–106)
LV sys vol: 13 mL
Peak HR: 105 {beats}/min
Rest HR: 88 {beats}/min
SDS: 3
SRS: 2
SSS: 5
TID: 1.04

## 2021-04-01 MED ORDER — TECHNETIUM TC 99M TETROFOSMIN IV KIT
32.9000 | PACK | Freq: Once | INTRAVENOUS | Status: AC | PRN
  Administered 2021-04-01: 32.9 via INTRAVENOUS
  Filled 2021-04-01: qty 33

## 2021-04-01 MED ORDER — REGADENOSON 0.4 MG/5ML IV SOLN
0.4000 mg | Freq: Once | INTRAVENOUS | Status: AC
  Administered 2021-04-01: 0.4 mg via INTRAVENOUS

## 2021-04-01 MED ORDER — TECHNETIUM TC 99M TETROFOSMIN IV KIT
10.9000 | PACK | Freq: Once | INTRAVENOUS | Status: AC | PRN
  Administered 2021-04-01: 10.9 via INTRAVENOUS
  Filled 2021-04-01: qty 11

## 2021-04-01 MED ORDER — PERFLUTREN LIPID MICROSPHERE
1.0000 mL | INTRAVENOUS | Status: AC | PRN
  Administered 2021-04-01: 2 mL via INTRAVENOUS

## 2021-04-01 NOTE — Telephone Encounter (Signed)
-----   Message from Antonieta Iba, RN sent at 04/01/2021  4:20 PM EDT ----- Received: Today Sueanne Margarita, MD  Antonieta Iba, RN Please find out if she has had any further DOE - if yes then refer to Pulmonary

## 2021-04-01 NOTE — Telephone Encounter (Signed)
The patient has been notified of the result and verbalized understanding.  All questions (if any) were answered. Alice Iba, RN 04/01/2021 4:24 PM  Patient states that she is still having DOE. Referral has been place for pulmonology

## 2021-05-05 ENCOUNTER — Other Ambulatory Visit: Payer: Self-pay | Admitting: Family Medicine

## 2021-05-05 DIAGNOSIS — Z1231 Encounter for screening mammogram for malignant neoplasm of breast: Secondary | ICD-10-CM

## 2021-05-06 ENCOUNTER — Ambulatory Visit: Payer: HMO

## 2021-05-15 ENCOUNTER — Other Ambulatory Visit: Payer: Self-pay

## 2021-05-15 ENCOUNTER — Ambulatory Visit
Admission: RE | Admit: 2021-05-15 | Discharge: 2021-05-15 | Disposition: A | Discharge status: Home / Self Care | Payer: HMO | Source: Ambulatory Visit | Attending: Family Medicine | Admitting: Family Medicine

## 2021-05-15 DIAGNOSIS — Z1231 Encounter for screening mammogram for malignant neoplasm of breast: Secondary | ICD-10-CM | POA: Diagnosis not present

## 2021-05-29 DIAGNOSIS — H2512 Age-related nuclear cataract, left eye: Secondary | ICD-10-CM | POA: Diagnosis not present

## 2021-06-01 ENCOUNTER — Encounter: Payer: Self-pay | Admitting: Internal Medicine

## 2021-06-01 ENCOUNTER — Ambulatory Visit: Payer: HMO | Admitting: Internal Medicine

## 2021-06-01 ENCOUNTER — Other Ambulatory Visit: Payer: Self-pay

## 2021-06-01 ENCOUNTER — Ambulatory Visit (INDEPENDENT_AMBULATORY_CARE_PROVIDER_SITE_OTHER): Payer: HMO

## 2021-06-01 VITALS — BP 130/68 | HR 76 | Temp 97.6°F | Ht 67.0 in | Wt 212.6 lb

## 2021-06-01 DIAGNOSIS — R059 Cough, unspecified: Secondary | ICD-10-CM | POA: Diagnosis not present

## 2021-06-01 DIAGNOSIS — R0609 Other forms of dyspnea: Secondary | ICD-10-CM

## 2021-06-01 DIAGNOSIS — I10 Essential (primary) hypertension: Secondary | ICD-10-CM

## 2021-06-01 DIAGNOSIS — R0600 Dyspnea, unspecified: Secondary | ICD-10-CM

## 2021-06-01 DIAGNOSIS — Z794 Long term (current) use of insulin: Secondary | ICD-10-CM | POA: Diagnosis not present

## 2021-06-01 DIAGNOSIS — E1165 Type 2 diabetes mellitus with hyperglycemia: Secondary | ICD-10-CM | POA: Diagnosis not present

## 2021-06-01 DIAGNOSIS — M810 Age-related osteoporosis without current pathological fracture: Secondary | ICD-10-CM | POA: Diagnosis not present

## 2021-06-01 DIAGNOSIS — E1136 Type 2 diabetes mellitus with diabetic cataract: Secondary | ICD-10-CM | POA: Diagnosis not present

## 2021-06-01 NOTE — Progress Notes (Signed)
Alice Cole, female    DOB: 1946-05-26   MRN: [id number]   Brief patient profile:  51  yowf never smoker  referred to pulmonary clinic  Dr  Fransico Him for doe with w/u CPST suggesting ? Cardiac limitation in 2018 > card w/u by Dr Radford Pax with summary:   02/03/21 history of PVCs and normal coronary arteries by cath in 2003. Nuclear stress test showed no ischemia in 2017 and 2D echo showed normal LVF with diastolic dysfunction.  She has chronic DOE felt secondary to obesity, poorly controlled hypertension, deconditioning and DD. PFTs were abnormal with mild to moderate restriction (with ERV 20% @ wt 192 01/12/17) and she was referred to pulmonary. High-resolution CT 02/2017 showed no evidence of interstitial lung disease and tiny 2-3 mm pulmonary nodules on the right. She was referred to pulmonary rehabilitation.  Worse since covid assoc with 20 lb wt gain / referred back to pulmonary 06/01/2021    History of Present Illness  06/01/2021  Pulmonary/ 1st office eval/Chastelyn [place] on ACEi  Chief Complaint  Patient presents with   Consult    DOE    Dyspnea:  food lion x 2-3 aisles x sev years gradually worse  Cough: none but has "drainage" / globus sensation off and on x years > no longer seeing ENT / also hs  Sleep: bed is flat with 5 pillows  SABA use:  none   No obvious day to day or daytime variability or assoc excess/ purulent sputum or mucus plugs or hemoptysis or cp or chest tightness, subjective wheeze or overt sinus or hb symptoms.    Also denies any obvious fluctuation of symptoms with weather or environmental changes or other aggravating or alleviating factors except as outlined above   No unusual exposure hx or h/o childhood pna/ asthma or knowledge of premature birth.  Current Allergies, Complete Past Medical History, Past Surgical History, Family History, and Social History were reviewed in [employer] record.  ROS  The following are not active complaints unless  bolded Hoarseness, sore throat, dysphagia, dental problems, itching, sneezing,  nasal congestion or sensation of discharge of excess mucus or purulent secretions, ear ache,   fever, chills, sweats, unintended wt loss or wt gain, classically pleuritic or exertional cp,  orthopnea pnd or arm/hand swelling  or leg swelling, presyncope, palpitations, abdominal pain, anorexia, nausea, vomiting, diarrhea  or change in bowel habits or change in bladder habits, change in stools or change in urine, dysuria, hematuria,  rash, arthralgias, visual complaints, headache, numbness, weakness or ataxia or problems with walking or coordination,  change in mood or  memory.              Past Medical History:  Diagnosis Date   Abnormal EKG 07/09/2016   Abnormal liver function    Adenomatous colon polyp    Arthritis    Bigeminy    Colonic polyp    Dermatitis    Diabetes mellitus    Diverticulitis    recurrent   DVT (deep venous thrombosis) (HCC)    Esophageal stricture    GERD (gastroesophageal reflux disease)    Hemorrhoids    Hyperlipidemia    Hypertension    OSA (obstructive sleep apnea) 07/09/2016   last PSG showed no significant OSA   PVC's (premature ventricular contractions)     Outpatient Medications Prior to Visit  Medication Sig Dispense Refill   acetaminophen (TYLENOL) 500 MG tablet Take 1,000 mg by mouth every 6 (six) hours as  needed for moderate pain or headache.     ALPRAZolam (XANAX) 0.25 MG tablet Take 0.125-0.25 mg by mouth daily as needed for anxiety.     aspirin 81 MG tablet Take 81 mg by mouth daily.     BD PEN NEEDLE NANO U/F 32G X 4 MM MISC      cholecalciferol (VITAMIN D3) 25 MCG (1000 UT) tablet Take 1,000 Units by mouth daily.     diltiazem (CARDIZEM CD) 360 MG 24 hr capsule TAKE 1 CAPSULE BY MOUTH EVERY DAY 90 capsule 3   hydrochlorothiazide (HYDRODIURIL) 25 MG tablet Take 25 mg by mouth daily.     insulin aspart (NOVOLOG) 100 UNIT/ML injection Inject 42-54 Units into the  skin See admin instructions. Inject 42 units at breakfast and at lunch and 44-55 units at supper     omeprazole (PRILOSEC) 20 MG capsule Take 20 mg by mouth daily.     pravastatin (PRAVACHOL) 40 MG tablet Take 40 mg by mouth at bedtime.   11   ramipril (ALTACE) 10 MG capsule Take 10 mg by mouth 2 (two) times daily.     No facility-administered medications prior to visit.     Objective:     BP 130/68 (BP Location: Left Arm, Cuff Size: Normal)   Pulse 76   Temp 97.6 F (36.4 C) (Oral)   Ht '5\' 7"'$  (1.702 m)   Wt 212 lb 9.6 oz (96.4 kg)   SpO2 95% Comment: ra  BMI 33.30 kg/m   SpO2: 95 % (ra)  Amb wf nad   HEENT : pt wearing mask not removed for exam due to covid -19 concerns.    NECK :  without JVD/Nodes/TM/ nl carotid upstrokes bilaterally   LUNGS: no acc muscle use,  Nl contour chest which is clear to A and P bilaterally without cough on insp or exp maneuvers   CV:  RRR  no s3 or murmur or increase in P2, and no edema   ABD:  soft and nontender with nl inspiratory excursion in the supine position. No bruits or organomegaly appreciated, bowel sounds nl  MS:  Nl gait/ ext warm without deformities, calf tenderness, cyanosis or clubbing No obvious joint restrictions   SKIN: warm and dry without lesions    NEURO:  alert, approp, nl sensorium with  no motor or cerebellar deficits apparent.    CXR PA and Lateral:   06/01/2021 :    I personally reviewed images  / radiology impression as follows:    No active cardiopulmonary disease.       Assessment   DOE (dyspnea on exertion) Onset p covid 2020 with 20 lb of wt gain PFTs were abnormal with mild to moderate restriction (with ERV 20% @ wt 192 01/12/17)   - Echo   1. Left ventricular ejection fraction, by estimation, is 70 to 75%. The  left ventricle has hyperdynamic function. The left ventricle has no  regional wall motion abnormalities. There is moderate asymmetric left  ventricular hypertrophy of the basal-septal    segment. Left ventricular diastolic parameters were normal and LA size nl   2. Right ventricular systolic function is normal. The right ventricular  size is normal. Tricuspid regurgitation signal is inadequate for assessing  PA pressure.   3. The mitral valve is normal in structure. No evidence of mitral valve  regurgitation. No evidence of mitral stenosis.   4. The aortic valve was not well visualized. Aortic valve regurgitation  is not visualized. Mild to moderate aortic  valve sclerosis/calcification  is present, without any evidence of aortic stenosis.  - 06/01/2021   At wt 212 Walked RA x  2 laps @ approx 240f each @ slow pace stopped due to fatigue with fatigue, no sob   Her Restrictive lung volumes were explained in 2018 by body habitus based on disproportionate reduction in ERV and likely still a major factor but clearly Symptoms are markedly disproportionate to objective findings and not clear to what extent this is actually a pulmonary  problem but pt does appear to have difficult to sort out respiratory symptoms of unknown origin for which  DDX  = almost all start with A and  include Adherence, Ace Inhibitors, Acid Reflux, Active Sinus Disease, Alpha 1 Antitripsin deficiency, Anxiety masquerading as Airways dz,  ABPA,  Allergy(esp in young), Aspiration (esp in elderly), Adverse effects of meds,  Active smoking or Vaping, A bunch of PE's/clot burden (a few small clots can't cause this syndrome unless there is already severe underlying pulm or vascular dz with poor reserve),  Anemia or thyroid disorder, plus two Bs  = Bronchiectasis and Beta blocker use..and one C= CHF     Adherence is always the initial "prime suspect" and is a multilayered concern that requires a "trust but verify" approach in every patient - starting with knowing how to use medications, especially inhalers, correctly, keeping up with refills and understanding the fundamental difference between maintenance and prns vs those  medications only taken for a very short course and then stopped and not refilled.    ACEi adverse effects at the  top of the usual list of suspects and the only way to rule it out is a trial off > will consider at f/u    ? Acid (or non-acid) GERD > always difficult to exclude as up to 75% of pts in some series report no assoc GI/ Heartburn symptoms> rec max (24h)  acid suppression and diet restrictions/ reviewed and instructions given in writing.   ? Anxiety/depression/ deconditioning  > usually at the bottom of this list of usual suspects but should be much higher on this pt's based on H and P and note already on psychotropics and may interfere with adherence and also interpretation of response or lack thereof to symptom management which can be quite subjective.   ? chf > excluded for all intents and purposes though is still at risk of diastolic dysfunction > see hbp   Each maintenance medication was reviewed in detail including emphasizing most importantly the difference between maintenance and prns and under what circumstances the prns are to be triggered using an action plan format where appropriate.  Total time for H and P, chart review, counseling,  directly observing portions of ambulatory 02 saturation study/ and generating customized AVS unique to this office visit / same day charting = 31 min                    Essential hypertension, benign       MChristinia Gully MD 06/01/2021

## 2021-06-01 NOTE — Patient Instructions (Addendum)
Please remember to go to the  x-ray department  for your tests - we will call you with the results when they are available    Change prilosec to 20 mg to Take 30- 60 min before your first and last meals of the day   GERD (REFLUX)  is an extremely common cause of respiratory symptoms just like yours , many times with no obvious heartburn at all.    It can be treated with medication, but also with lifestyle changes including elevation of the head of your bed (ideally with 6 -8inch blocks under the headboard of your bed),  Smoking cessation, avoidance of late meals, excessive alcohol, and avoid fatty foods, chocolate, peppermint, colas, red wine, and acidic juices such as orange juice.  NO MINT OR MENTHOL PRODUCTS SO NO COUGH DROPS  USE SUGARLESS CANDY INSTEAD (Jolley ranchers or Stover's or Life Savers) or even ice chips will also do - the key is to swallow to prevent all throat clearing. NO OIL BASED VITAMINS - use powdered substitutes.  Avoid fish oil when coughing.   Please schedule a follow up office visit in 6-8 weeks, call sooner if needed

## 2021-06-02 ENCOUNTER — Encounter: Payer: Self-pay | Admitting: Internal Medicine

## 2021-06-02 ENCOUNTER — Telehealth: Payer: Self-pay | Admitting: Cardiology

## 2021-06-02 ENCOUNTER — Encounter: Payer: Self-pay | Admitting: *Deleted

## 2021-06-02 NOTE — Assessment & Plan Note (Addendum)
Onset p covid 2020 with 20 lb of wt gain PFTs were abnormal with mild to moderate restriction (with ERV 20% @ wt 192 01/12/17)   - Echo  1. Left ventricular ejection fraction, by estimation, is 70 to 75%. The  left ventricle has hyperdynamic function. The left ventricle has no  regional wall motion abnormalities. There is moderate asymmetric left  ventricular hypertrophy of the basal-septal  segment. Left ventricular diastolic parameters were normal and LA size nl  2. Right ventricular systolic function is normal. The right ventricular  size is normal. Tricuspid regurgitation signal is inadequate for assessing  PA pressure.  3. The mitral valve is normal in structure. No evidence of mitral valve  regurgitation. No evidence of mitral stenosis.  4. The aortic valve was not well visualized. Aortic valve regurgitation  is not visualized. Mild to moderate aortic valve sclerosis/calcification  is present, without any evidence of aortic stenosis.  - 06/01/2021   At wt 212 Walked RA x  2 laps @ approx 242f each @ slow pace stopped due to fatigue with fatigue, no sob   Her Restrictive lung volumes were explained in 2018 by body habitus based on disproportionate reduction in ERV and likely still a major factor but clearly Symptoms are markedly disproportionate to objective findings and not clear to what extent this is actually a pulmonary  problem but pt does appear to have difficult to sort out respiratory symptoms of unknown origin for which  DDX  = almost all start with A and  include Adherence, Ace Inhibitors, Acid Reflux, Active Sinus Disease, Alpha 1 Antitripsin deficiency, Anxiety masquerading as Airways dz,  ABPA,  Allergy(esp in young), Aspiration (esp in elderly), Adverse effects of meds,  Active smoking or Vaping, A bunch of PE's/clot burden (a few small clots can't cause this syndrome unless there is already severe underlying pulm or vascular dz with poor reserve),  Anemia or thyroid disorder,  plus two Bs  = Bronchiectasis and Beta blocker use..and one C= CHF     Adherence is always the initial "prime suspect" and is a multilayered concern that requires a "trust but verify" approach in every patient - starting with knowing how to use medications, especially inhalers, correctly, keeping up with refills and understanding the fundamental difference between maintenance and prns vs those medications only taken for a very short course and then stopped and not refilled.    ACEi adverse effects at the  top of the usual list of suspects and the only way to rule it out is a trial off > will consider at f/u    ? Acid (or non-acid) GERD > always difficult to exclude as up to 75% of pts in some series report no assoc GI/ Heartburn symptoms> rec max (24h)  acid suppression and diet restrictions/ reviewed and instructions given in writing.   ? Anxiety/depression/ deconditioning  > usually at the bottom of this list of usual suspects but should be much higher on this pt's based on H and P and note already on psychotropics and may interfere with adherence and also interpretation of response or lack thereof to symptom management which can be quite subjective.   ? chf > excluded for all intents and purposes though is still at risk of diastolic dysfunction > see hbp   Each maintenance medication was reviewed in detail including emphasizing most importantly the difference between maintenance and prns and under what circumstances the prns are to be triggered using an action plan format where appropriate.  Total time for H and P, chart review, counseling,  directly observing portions of ambulatory 02 saturation study/ and generating customized AVS unique to this office visit / same day charting = 31 min

## 2021-06-02 NOTE — Telephone Encounter (Signed)
Pt c/o medication issue:  1. Name of Medication:  ramipril (ALTACE) 10 MG capsule  2. How are you currently taking this medication (dosage and times per day)?  As prescribed  3. Are you having a reaction (difficulty breathing--STAT)?  No   4. What is your medication issue?   Patient's states her diabetes MD is wanting her to stop Ramipril and start taking Losartan. Please return call to discuss further and advise.

## 2021-06-03 NOTE — Telephone Encounter (Signed)
Spoke with the patient who states that she went to see her pulmonologist who thought that her Ramipirl could be causing her SOB. He recommended that she come off of it. Patient states she was waiting to hear what other medication he wanted to switch her to.  In the meantime she has been to see her endocrinologist who recommended discontinuing the ramipril and starting on losartan. She has not switched to losartan yet. She will let us know if that is what they decide to do.

## 2021-07-09 DIAGNOSIS — U071 COVID-19: Secondary | ICD-10-CM | POA: Diagnosis not present

## 2021-07-10 ENCOUNTER — Telehealth: Payer: Self-pay | Admitting: Cardiology

## 2021-07-10 NOTE — Telephone Encounter (Signed)
Ok to take Lagevrio 

## 2021-07-10 NOTE — Telephone Encounter (Signed)
Pt c/o medication issue:  1. Name of Medication: Lagevrio 200 MG   2. How are you currently taking this medication (dosage and times per day)? Has not started taking yet   3. Are you having a reaction (difficulty breathing--STAT)? No   4. What is your medication issue? Chele currently has Covid and the walk-in clinic prescribed her this medication to take 4 tablets 2x daily. She is wanting to know if this is okay for her to take due to seeing it can cause rapid HR.

## 2021-07-10 NOTE — Telephone Encounter (Signed)
Called patient back and informed her of pharmacist recommendation.

## 2021-07-13 ENCOUNTER — Ambulatory Visit: Payer: HMO | Admitting: Internal Medicine

## 2021-07-29 DIAGNOSIS — H18832 Recurrent erosion of cornea, left eye: Secondary | ICD-10-CM | POA: Diagnosis not present

## 2021-08-11 DIAGNOSIS — R109 Unspecified abdominal pain: Secondary | ICD-10-CM | POA: Diagnosis not present

## 2021-08-11 DIAGNOSIS — F411 Generalized anxiety disorder: Secondary | ICD-10-CM | POA: Diagnosis not present

## 2021-08-26 DIAGNOSIS — Z86010 Personal history of colonic polyps: Secondary | ICD-10-CM | POA: Diagnosis not present

## 2021-08-26 DIAGNOSIS — K5900 Constipation, unspecified: Secondary | ICD-10-CM | POA: Diagnosis not present

## 2021-08-26 DIAGNOSIS — K589 Irritable bowel syndrome without diarrhea: Secondary | ICD-10-CM | POA: Diagnosis not present

## 2021-08-26 DIAGNOSIS — K219 Gastro-esophageal reflux disease without esophagitis: Secondary | ICD-10-CM | POA: Diagnosis not present

## 2021-09-21 IMAGING — MG MM DIGITAL SCREENING BILAT W/ TOMO AND CAD
7 series · 8 of 19 positions shown · non-contrast
Comparison: Previous exam(s).

CLINICAL DATA: Screening.

EXAM:
DIGITAL SCREENING BILATERAL MAMMOGRAM WITH TOMOSYNTHESIS AND CAD
TECHNIQUE: Bilateral screening digital craniocaudal and mediolateral oblique
mammograms were obtained. Bilateral screening digital breast
tomosynthesis was performed. The images were evaluated with
computer-aided detection.

[R MLO synth-2D]
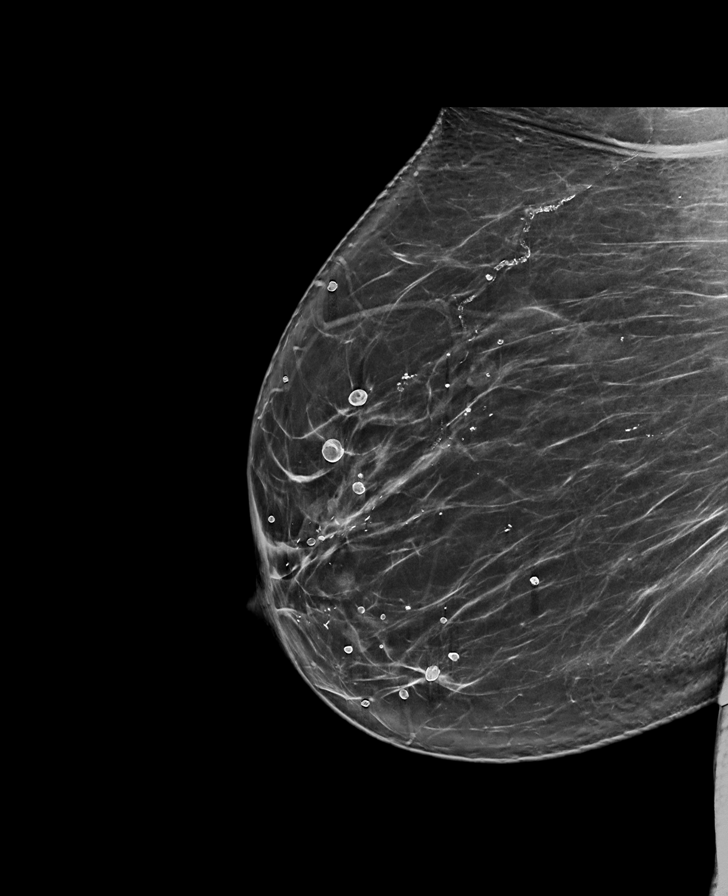

[R CC synth-2D]
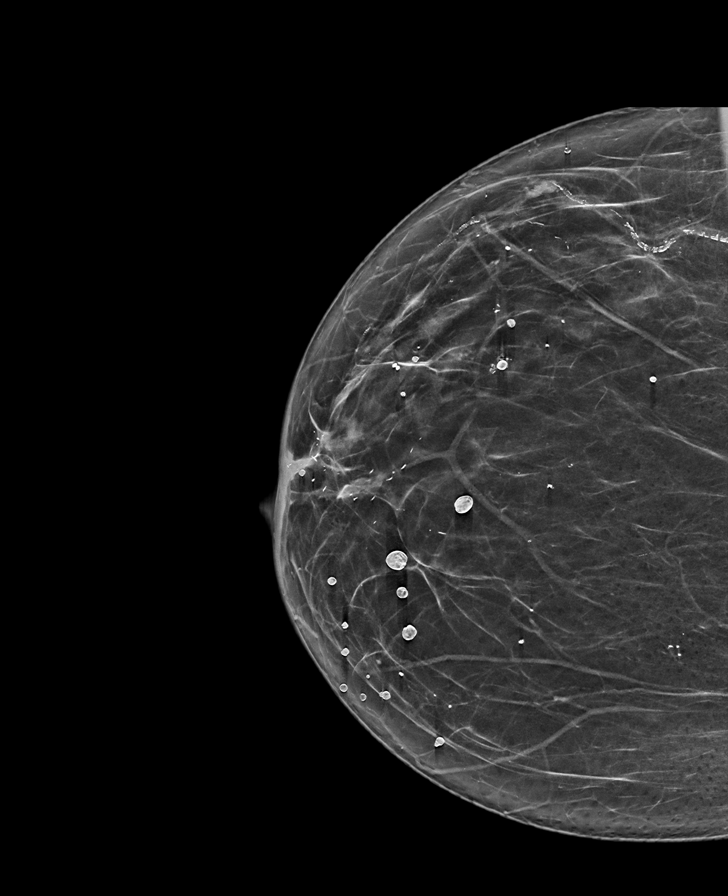

[L CC synth-2D]
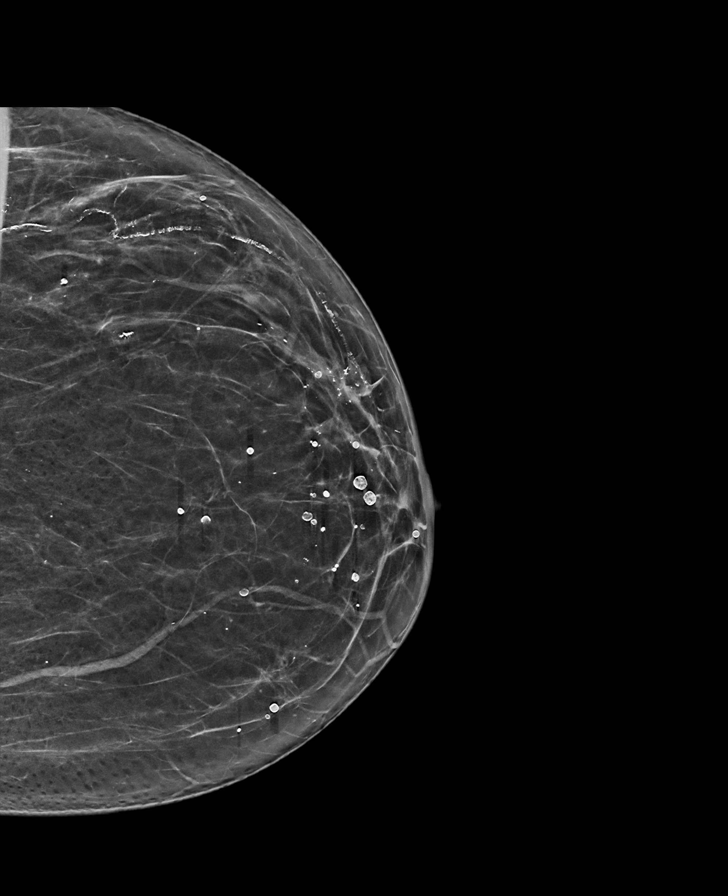

[L MLO synth-2D]
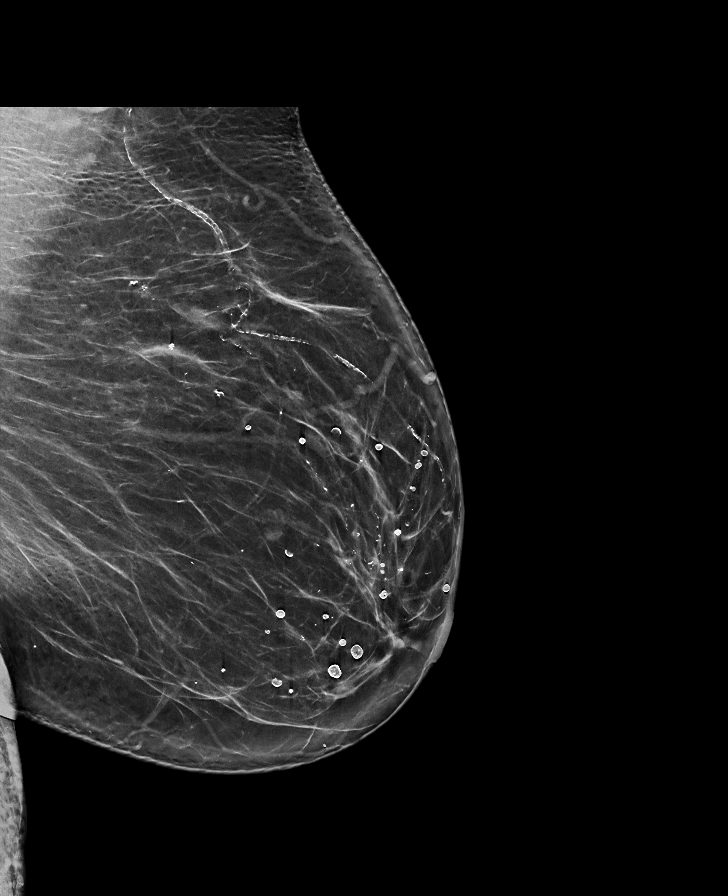

[R CC tomo · 2 of 69 frames shown]
[frame 23/69]
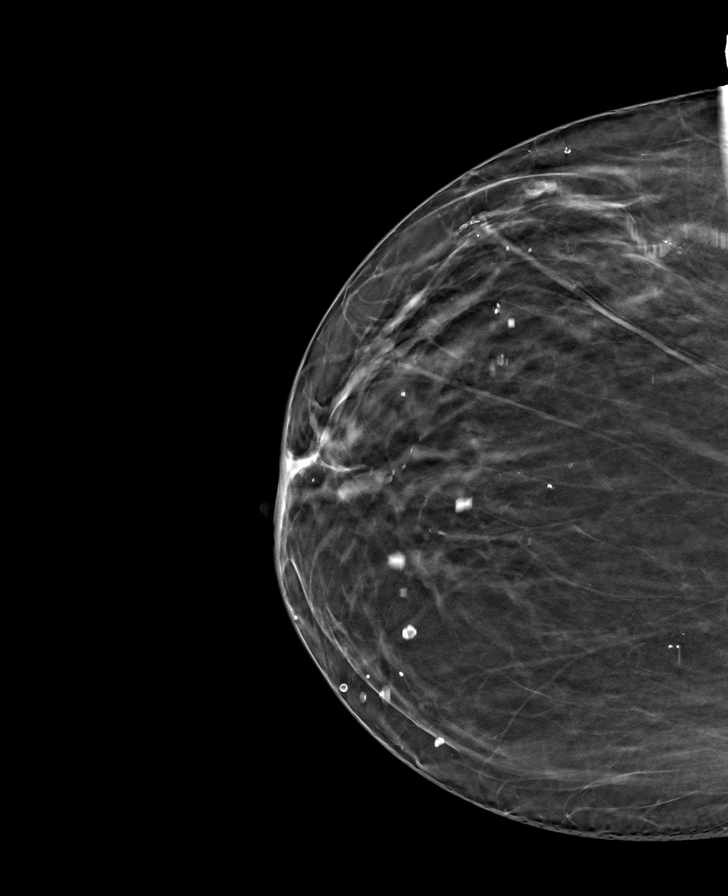
[frame 35/69]
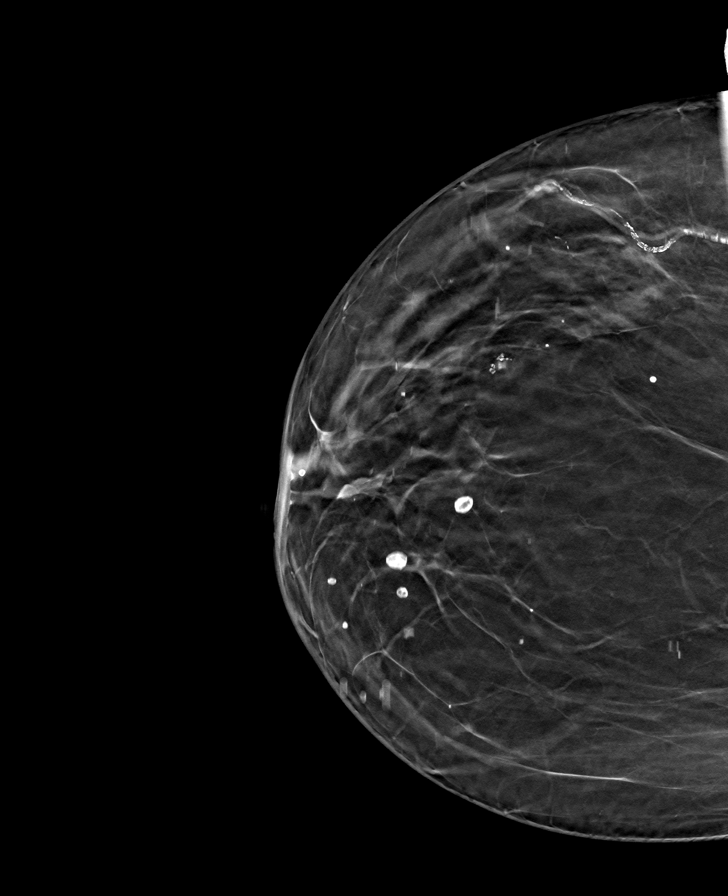

[R MLO tomo · tomo slice 43/84.0]
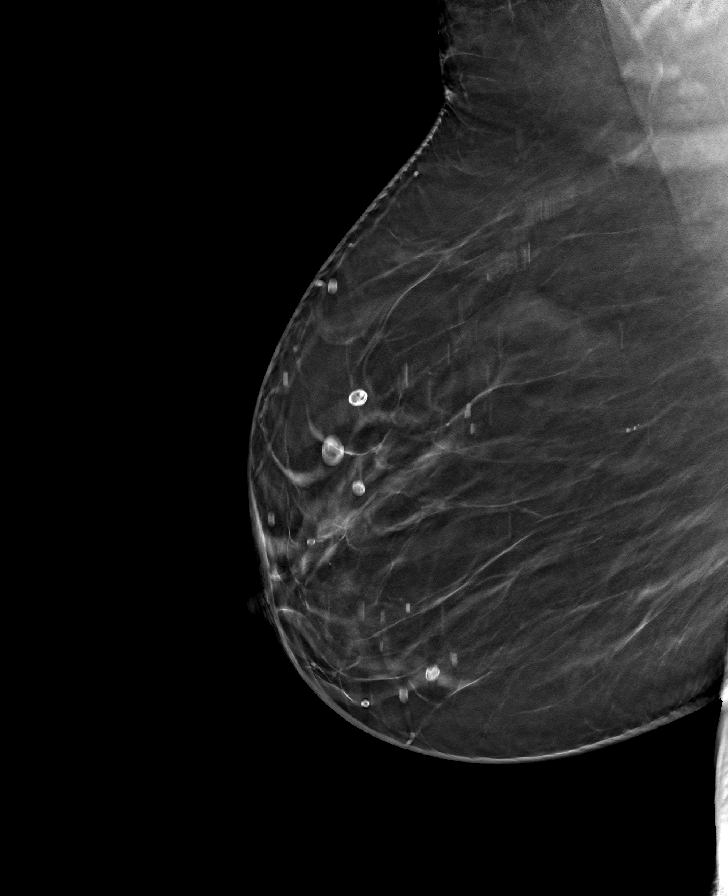

[L CC tomo · tomo slice 34/67.0]
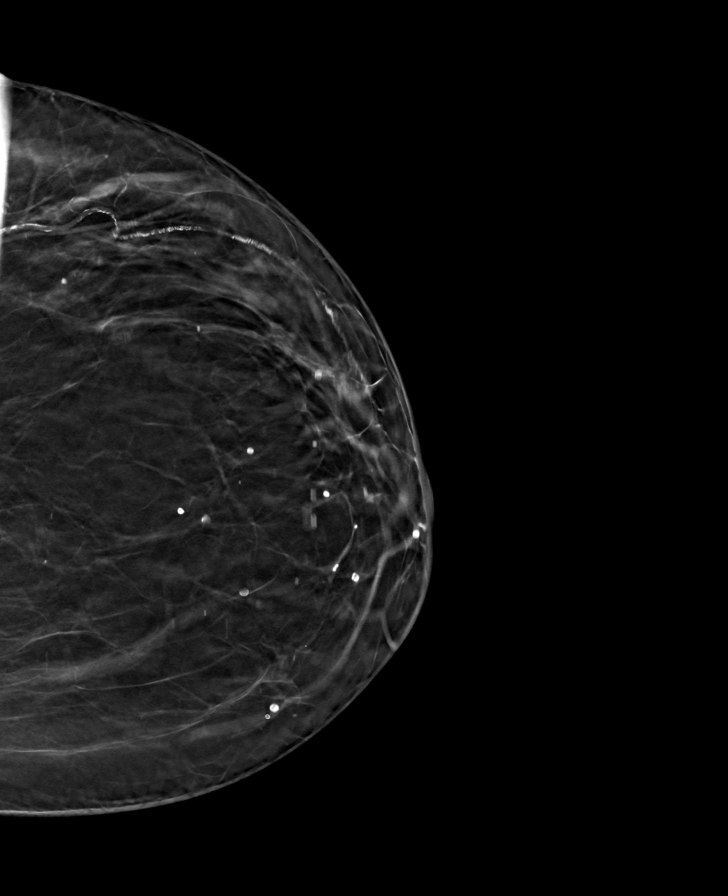

[8 of 19 positions shown; findings below may reference images not displayed]

ACR Breast Density Category b: There are scattered areas of
fibroglandular density.
FINDINGS: There are no findings suspicious for malignancy.
IMPRESSION: No mammographic evidence of malignancy. A result letter of this
screening mammogram will be mailed directly to the patient.

RECOMMENDATION:
Screening mammogram in one year. (Code:51-O-LD2)

BI-RADS CATEGORY  1: Negative.

## 2021-10-01 DIAGNOSIS — M25522 Pain in left elbow: Secondary | ICD-10-CM | POA: Diagnosis not present

## 2021-10-01 DIAGNOSIS — M25532 Pain in left wrist: Secondary | ICD-10-CM | POA: Diagnosis not present

## 2021-10-15 ENCOUNTER — Ambulatory Visit: Payer: HMO | Admitting: Internal Medicine

## 2021-11-17 ENCOUNTER — Encounter: Payer: Self-pay | Admitting: Internal Medicine

## 2021-11-17 ENCOUNTER — Ambulatory Visit: Payer: HMO | Admitting: Internal Medicine

## 2021-11-17 ENCOUNTER — Other Ambulatory Visit: Payer: Self-pay

## 2021-11-17 DIAGNOSIS — R0609 Other forms of dyspnea: Secondary | ICD-10-CM

## 2021-11-17 DIAGNOSIS — I10 Essential (primary) hypertension: Secondary | ICD-10-CM | POA: Diagnosis not present

## 2021-11-17 MED ORDER — IRBESARTAN 150 MG PO TABS: 150.0000 mg | ORAL_TABLET | Freq: Every day | ORAL | 11 refills | Status: DC

## 2021-11-17 NOTE — Progress Notes (Signed)
Alice Cole, female    DOB: 1945/12/26   MRN: [id number]   Brief patient profile:  37  yowf never smoker  referred to pulmonary clinic  Dr  Fransico Him for doe with w/u CPST suggesting ? Cardiac limitation in 2018 > card w/u by Dr Radford Pax with summary:   02/03/21 history of PVCs and normal coronary arteries by cath in 2003. Nuclear stress test showed no ischemia in 2017 and 2D echo showed normal LVF with diastolic dysfunction.  She has chronic DOE felt secondary to obesity, poorly controlled hypertension, deconditioning and DD. PFTs were abnormal with mild to moderate restriction (with ERV 20% @ wt 192 01/12/17) and she was referred to pulmonary. High-resolution CT 02/2017 showed no evidence of interstitial lung disease and tiny 2-3 mm pulmonary nodules on the right. She was referred to pulmonary rehabilitation.  Worse since covid assoc with 20 lb wt gain / referred back to pulmonary 06/01/2021    History of Present Illness  06/01/2021  Pulmonary/ 1st office eval/Kasidy Gianino on ACEi  Chief Complaint  Patient presents with   Consult    DOE    Dyspnea:  food lion x 2-3 aisles x sev years gradually worse  Cough: none but has "drainage" / globus sensation off and on x years > no longer seeing ENT / also hs  Sleep: bed is flat with 5 pillows  SABA use:  none  Rec Change prilosec to 20 mg to Take 30- 60 min before your first and last meals of the day  GERD diet reviewed, bed blocks rec  Cxr > ok    11/17/2021  f/u ov/Tomorrow Dehaas re: doe/pnds maint on gerd rx and ACEi   Chief Complaint  Patient presents with   Follow-up    Breathing has improved since the last visit. No new co's.   Dyspnea:  no change activity tol Cough: still with globus and sensatoin  Sleeping: 5 pillows not new  SABA use: none  02: none  Covid status:   JJ then moderna    No obvious day to day or daytime variability or assoc excess/ purulent sputum or mucus plugs or hemoptysis or cp or chest tightness, subjective wheeze or overt sinus  or hb symptoms.   .sleeping  without nocturnal  or early am exacerbation  of respiratory  c/o's or need for noct saba. Also denies any obvious fluctuation of symptoms with weather or environmental changes or other aggravating or alleviating factors except as outlined above   No unusual exposure hx or h/o childhood pna/ asthma or knowledge of premature birth.  Current Allergies, Complete Past Medical History, Past Surgical History, Family History, and Social History were reviewed in [employer] [facility] record.  ROS  The following are not active complaints unless bolded Hoarseness, sore throat, dysphagia/ globus, dental problems, itching, sneezing,  nasal congestion or discharge of excess mucus or purulent secretions, ear ache,   fever, chills, sweats, unintended wt loss or wt gain, classically pleuritic or exertional cp,  orthopnea pnd or arm/hand swelling  or leg swelling, presyncope, palpitations, abdominal pain, anorexia, nausea, vomiting, diarrhea  or change in bowel habits or change in bladder habits, change in stools or change in urine, dysuria, hematuria,  rash, arthralgias, visual complaints, headache, numbness, weakness or ataxia or problems with walking or coordination,  change in mood or  memory.        Current Meds  Medication Sig   acetaminophen (TYLENOL) 500 MG tablet Take 1,000 mg by mouth every  6 (six) hours as needed for moderate pain or headache.   ALPRAZolam (XANAX) 0.25 MG tablet Take 0.125-0.25 mg by mouth daily as needed for anxiety.   aspirin 81 MG tablet Take 81 mg by mouth daily.   BD PEN NEEDLE NANO U/F 32G X 4 MM MISC    cholecalciferol (VITAMIN D3) 25 MCG (1000 UT) tablet Take 1,000 Units by mouth daily.   diltiazem (CARDIZEM CD) 360 MG 24 hr capsule TAKE 1 CAPSULE BY MOUTH EVERY DAY   hydrochlorothiazide (HYDRODIURIL) 25 MG tablet Take 25 mg by mouth daily.   insulin aspart (NOVOLOG) 100 UNIT/ML injection Inject 42-54 Units into the skin See admin  instructions. Inject 42 units at breakfast and at lunch and 44-55 units at supper   insulin glargine (LANTUS) 100 UNIT/ML injection Inject 24 Units into the skin daily.   omeprazole (PRILOSEC) 20 MG capsule Take 20 mg by mouth daily.   pravastatin (PRAVACHOL) 40 MG tablet Take 40 mg by mouth at bedtime.    ramipril (ALTACE) 10 MG capsule Take 10 mg by mouth 2 (two) times daily.                      Past Medical History:  Diagnosis Date   Abnormal EKG 07/09/2016   Abnormal liver function    Adenomatous colon polyp    Arthritis    Bigeminy    Colonic polyp    Dermatitis    Diabetes mellitus    Diverticulitis    recurrent   DVT (deep venous thrombosis) (HCC)    Esophageal stricture    GERD (gastroesophageal reflux disease)    Hemorrhoids    Hyperlipidemia    Hypertension    OSA (obstructive sleep apnea) 07/09/2016   last PSG showed no significant OSA   PVC's (premature ventricular contractions)         Objective:     Wts   11/17/2021         211   06/01/21 212 lb 9.6 oz (96.4 kg)  04/01/21 208 lb (94.3 kg)  02/03/21 208 lb 9.6 oz (94.6 kg)      Vital signs reviewed  11/17/2021  - Note at rest 02 sats  96% on RA   General appearance:    slt hoarse amb wf with classic pseudowheeze      HEENT : pt wearing mask not removed for exam due to covid -19 concerns.    NECK :  without JVD/Nodes/TM/ nl carotid upstrokes bilaterally   LUNGS: no acc muscle use,  Nl contour chest which is clear to A and P bilaterally without cough on insp or exp maneuvers   CV:  RRR  no s3 or murmur or increase in P2, and no edema   ABD:  soft and nontender with nl inspiratory excursion in the supine position. No bruits or organomegaly appreciated, bowel sounds nl  MS:  Nl gait/ ext warm without deformities, calf tenderness, cyanosis or clubbing No obvious joint restrictions   SKIN: warm and dry without lesions    NEURO:  alert, approp, nl sensorium with  no motor or cerebellar  deficits apparent.      Assessment

## 2021-11-17 NOTE — Patient Instructions (Addendum)
Stop ramapril and start Avapro 150 mg one daily  - ok to break in half  if BP too low (less than 110  on top number) and twice daily if too high (over 160 on the top number)  Return to primary to check your blood pressure and kidney function  or return here to one of our NP's in 2 weeks  If not all better in 6 weeks return with all your medications in hand   > otherwise follow

## 2021-11-18 ENCOUNTER — Encounter: Payer: Self-pay | Admitting: Internal Medicine

## 2021-11-18 NOTE — Assessment & Plan Note (Signed)
Changed altace to avapro 11/17/2021 for unexplained doe and globus sensation    ACE inhibitors are problematic in  pts with airway complaints because  even experienced pulmonologists can't always distinguish ace effects from copd/asthma.  By themselves they don't actually cause a problem, much like oxygen can't by itself start a fire, but they certainly serve as a powerful catalyst or enhancer for any "fire"  or inflammatory process in the upper airway, be it caused by an ET  tube or more commonly reflux (especially in the obese or pts with known GERD or who are on biphoshonates).    In the era of ARB near equivalency until we have a better handle on the reversibility of the airway problem, it just makes sense to avoid ACEI  entirely in the short run and then decide later, having established a level of airway control using a reasonable limited regimen, whether to add back ace but even then being very careful to observe the pt for worsening airway control and number of meds used/ needed to control symptoms.    >>> try avapro 150 mg titrate to aprop bp and f/u here in 6 weeks if not better to her satisfaction  Will check with pcp w/in 2 weeks or return here for bmet          Each maintenance medication was reviewed in detail including emphasizing most importantly the difference between maintenance and prns and under what circumstances the prns are to be triggered using an action plan format where appropriate.  Total time for H and P, chart review, counseling,  and generating customized AVS unique to this office visit / same day charting = 21 min

## 2021-11-18 NOTE — Assessment & Plan Note (Signed)
Onset p covid 2020 with 20 lb of wt gain PFTs were abnormal with mild to moderate restriction (with ERV 20% @ wt 192 01/12/17)  - Echo  1. Left ventricular ejection fraction, by estimation, is 70 to 75%. The  left ventricle has hyperdynamic function. The left ventricle has no  regional wall motion abnormalities. There is moderate asymmetric left  ventricular hypertrophy of the basal-septal  segment. Left ventricular diastolic parameters were normal and LA size nl  2. Right ventricular systolic function is normal. The right ventricular  size is normal. Tricuspid regurgitation signal is inadequate for assessing  PA pressure.  3. The mitral valve is normal in structure. No evidence of mitral valve  regurgitation. No evidence of mitral stenosis.  4. The aortic valve was not well visualized. Aortic valve regurgitation  is not visualized. Mild to moderate aortic valve sclerosis/calcification  is present, without any evidence of aortic stenosis.  - 06/01/2021   At wt 212 Walked RA x  2 laps @ approx 224ft each @ slow pace stopped due to fatigue with fatigue, no sob      ACEi adverse effects  Still  at the  top of the usual list of suspects and the only way to rule it out is a trial off > see a/p

## 2021-12-02 DIAGNOSIS — I10 Essential (primary) hypertension: Secondary | ICD-10-CM | POA: Diagnosis not present

## 2021-12-02 DIAGNOSIS — M810 Age-related osteoporosis without current pathological fracture: Secondary | ICD-10-CM | POA: Diagnosis not present

## 2021-12-02 DIAGNOSIS — E1136 Type 2 diabetes mellitus with diabetic cataract: Secondary | ICD-10-CM | POA: Diagnosis not present

## 2021-12-02 DIAGNOSIS — Z794 Long term (current) use of insulin: Secondary | ICD-10-CM | POA: Diagnosis not present

## 2021-12-04 DIAGNOSIS — D124 Benign neoplasm of descending colon: Secondary | ICD-10-CM | POA: Diagnosis not present

## 2021-12-04 DIAGNOSIS — K648 Other hemorrhoids: Secondary | ICD-10-CM | POA: Diagnosis not present

## 2021-12-04 DIAGNOSIS — K5730 Diverticulosis of large intestine without perforation or abscess without bleeding: Secondary | ICD-10-CM | POA: Diagnosis not present

## 2021-12-04 DIAGNOSIS — K644 Residual hemorrhoidal skin tags: Secondary | ICD-10-CM | POA: Diagnosis not present

## 2021-12-04 DIAGNOSIS — Z86010 Personal history of colonic polyps: Secondary | ICD-10-CM | POA: Diagnosis not present

## 2021-12-08 DIAGNOSIS — D124 Benign neoplasm of descending colon: Secondary | ICD-10-CM | POA: Diagnosis not present

## 2021-12-29 DIAGNOSIS — K5900 Constipation, unspecified: Secondary | ICD-10-CM | POA: Diagnosis not present

## 2021-12-29 DIAGNOSIS — I10 Essential (primary) hypertension: Secondary | ICD-10-CM | POA: Diagnosis not present

## 2021-12-29 DIAGNOSIS — R110 Nausea: Secondary | ICD-10-CM | POA: Diagnosis not present

## 2021-12-29 DIAGNOSIS — R1010 Upper abdominal pain, unspecified: Secondary | ICD-10-CM | POA: Diagnosis not present

## 2021-12-31 DIAGNOSIS — R1010 Upper abdominal pain, unspecified: Secondary | ICD-10-CM | POA: Diagnosis not present

## 2022-01-19 ENCOUNTER — Telehealth: Payer: Self-pay | Admitting: Cardiology

## 2022-01-19 NOTE — Telephone Encounter (Signed)
?  STAT if HR is under 50 or over 120 ?(normal HR is 60-100 beats per minute) ? ?What is your heart rate? 89 yesterday, 63 right now, she states it keeps going up and down ? ?Do you have a log of your heart rate readings (document readings)? no ? ?Do you have any other symptoms? Dizzy when she stood up yesterday when it was 88, feeling tired, stated yesterday she felt tightness in chest but has not had that today ?

## 2022-01-19 NOTE — Telephone Encounter (Signed)
Returned call to patient who states that yesterday while rising from sitting in her chair, she felt extremely dizzy and "had to get ahold of my dresser so I wouldn't fall." Pt states that she believed her sugar was low, but she checked it and states "it was good," unable to provide numerical value. Pt says she has a pulse oximeter at home so she thought she'd check her oxygen level and noticed that her HR showed it was 31bpm. States that her rate stayed in the low 40's all day yesterday. Questioned patient if this had happened before and she denies, only states that she had been feeling "very tired" and "having some tightness in my chest about all day." Pt states that today she has not felt these symptoms, states "I feel good." She states her HR currently at time of call is 51. Checked her BP this morning and was 139/62. Pt attests to taking her Diltiazem yesterday morning, and has already taken it today (takes first thing in the morning), so couldn't advise her to hold it. Pt denies symptoms at this time, and states that she isn't concerned, but doesn't want to end up in the emergency dept. Placed pt on next day's acute DOD slot. ?

## 2022-01-20 ENCOUNTER — Encounter: Payer: Self-pay | Admitting: Cardiovascular Disease

## 2022-01-20 ENCOUNTER — Encounter: Payer: Self-pay | Admitting: *Deleted

## 2022-01-20 ENCOUNTER — Ambulatory Visit (INDEPENDENT_AMBULATORY_CARE_PROVIDER_SITE_OTHER): Payer: HMO | Admitting: Cardiovascular Disease

## 2022-01-20 ENCOUNTER — Ambulatory Visit (INDEPENDENT_AMBULATORY_CARE_PROVIDER_SITE_OTHER): Payer: HMO

## 2022-01-20 VITALS — BP 150/72 | HR 80 | Ht 67.0 in | Wt 212.0 lb

## 2022-01-20 DIAGNOSIS — R001 Bradycardia, unspecified: Secondary | ICD-10-CM | POA: Diagnosis not present

## 2022-01-20 DIAGNOSIS — R55 Syncope and collapse: Secondary | ICD-10-CM

## 2022-01-20 MED ORDER — DILTIAZEM HCL ER COATED BEADS 240 MG PO CP24: 240.0000 mg | ORAL_CAPSULE | Freq: Every day | ORAL | 3 refills | Status: DC

## 2022-01-20 NOTE — Patient Instructions (Signed)
Medication Instructions:  ?Your physician has recommended you make the following change in your medication:  ? ?1) DECREASE Cardizem to '240mg'$  QD ? ?*If you need a refill on your cardiac medications before your next appointment, please call your pharmacy* ? ?Lab Work: ?NONE ?If you have labs (blood work) drawn today and your tests are completely normal, you will receive your results only by: ?MyChart Message (if you have MyChart) OR ?A paper copy in the mail ?If you have any lab test that is abnormal or we need to change your treatment, we will call you to review the results. ? ? ?Testing/Procedures: ?Your physician has recommended for you to wear a heart monitor for 14 days. ? ?Follow-Up: ?At [facility], you and your health needs are our priority.  As part of our continuing mission to provide you with exceptional heart care, we have created designated Provider Care Teams.  These Care Teams include your primary Cardiologist (physician) and Advanced Practice Providers (APPs -  Physician Assistants and Nurse Practitioners) who all work together to provide you with the care you need, when you need it. ? ?We recommend signing up for the patient portal called "MyChart".  Sign up information is provided on this After Visit Summary.  MyChart is used to connect with patients for Virtual Visits (Telemedicine).  Patients are able to view lab/test results, encounter notes, upcoming appointments, etc.  Non-urgent messages can be sent to your provider as well.   ?To learn more about what you can do with MyChart, go to [email or web address].   ? ?Your next appointment:   ?1 month(s) ? ?The format for your next appointment:   ?In Person ? ?Provider:   ?Fransico Him, MD { ? ?Other Instructions ?ZIO XT- Long Term Monitor Instructions ? ?Your physician has requested you wear a ZIO patch monitor for 14 days.  ?This is a single patch monitor. [employer] supplies one patch monitor per enrollment. Additional ?stickers are not  available. Please do not apply patch if you will be having a Nuclear Stress Test,  ?Echocardiogram, Cardiac CT, MRI, or Chest Xray during the period you would be wearing the  ?monitor. The patch cannot be worn during these tests. You cannot remove and re-apply the  ?ZIO XT patch monitor.  ?Your ZIO patch monitor will be mailed 3 day USPS to your address on file. It may take 3-5 days  ?to receive your monitor after you have been enrolled.  ?Once you have received your monitor, please review the enclosed instructions. Your monitor  ?has already been registered assigning a specific monitor serial # to you. ? ?Billing and Patient Assistance Program Information ? ?We have supplied [employer] with any of your insurance information on file for billing purposes. ?[employer] offers a sliding scale Patient Assistance Program for patients that do not have  ?insurance, or whose insurance does not completely cover the cost of the ZIO monitor.  ?You must apply for the Patient Assistance Program to qualify for this discounted rate.  ?To apply, please call [employer] at [phone], select option 4, select option 2, ask to apply for  ?Patient Assistance Program. Theodore Demark will ask your household income, and how many people  ?are in your household. They will quote your out-of-pocket cost based on that information.  ?[employer] will also be able to set up a 4-month interest-free payment plan if needed. ? ?Applying the monitor ?  ?Shave hair from upper left chest.  ?Hold abrader disc by orange tab. Rub abrader in 40 strokes over  the upper left chest as  ?indicated in your monitor instructions.  ?Clean area with 4 enclosed alcohol pads. Let dry.  ?Apply patch as indicated in monitor instructions. Patch will be placed under collarbone on left  ?side of chest with arrow pointing upward.  ?Rub patch adhesive wings for 2 minutes. Remove white label marked "1". Remove the white  ?label marked "2". Rub patch adhesive wings for 2 additional minutes.   ?While looking in a mirror, press and release button in center of patch. A small green light will  ?flash 3-4 times. This will be your only indicator that the monitor has been turned on.  ?Do not shower for the first 24 hours. You may shower after the first 24 hours.  ?Press the button if you feel a symptom. You will hear a small click. Record Date, Time and  ?Symptom in the Patient Logbook.  ?When you are ready to remove the patch, follow instructions on the last 2 pages of Patient  ?Logbook. Stick patch monitor onto the last page of Patient Logbook.  ?Place Patient Logbook in the blue and white box. Use locking tab on box and tape box closed  ?securely. The blue and white box has prepaid postage on it. Please place it in the mailbox as  ?soon as possible. Your physician should have your test results approximately 7 days after the  ?monitor has been mailed back to [facility].  ?Call [facility] at [phone] if you have questions regarding  ?your ZIO XT patch monitor. Call them immediately if you see an orange light blinking on your  ?monitor.  ?If your monitor falls off in less than 4 days, contact our Monitor department at [phone].  ?If your monitor becomes loose or falls off after 4 days call [employer] at [phone] for  ?suggestions on securing your monitor ? ? ?Important Information About Sugar ? ? ? ? ?  ?

## 2022-01-20 NOTE — Progress Notes (Unsigned)
[id number] ZIO XT monitor from office inventory applied to patient. ?

## 2022-01-20 NOTE — Progress Notes (Signed)
?Cardiology Office Note:   ? ?Date:  01/20/2022  ? ?ID:  Alice Cole, DOB September 28, 1946, MRN [id number] ? ?PCP:  No primary care provider on file. ?  ?[facility] Providers ?Cardiologist:  Fransico Him, MD { ?  ? ?Referring MD: Maurice Small, MD  ? ?Chief Complaint  ?Patient presents with  ? Bradycardia  ? Dizziness  ?  ? ?History of Present Illness:   ? ?Alice Cole is a 76 y.o. female with a hx of ventricular bigeminy, HLD.  ? ?She had an irreg HR last  ( BP showed an irreg rhythm)  ? ?2 days ago, she became very dizzy,  measured her HR in the 30s  ?In on Cardizem 360 mg a day .  ? ?Echocardiogram from June, 2022 reveals LVEF of 70 to 75%.  She has moderate asymmetric LVH.  She had normal diastolic function ?Lexiscan Myoview study from June, 2022 shows no evidence of ischemia.  The study was normal/low risk. ? ?Was thought to have OSA in the past, but she didn't qualify for CPAP at her most recent renewal  ? ?Past Medical History:  ?Diagnosis Date  ? Abnormal EKG 07/09/2016  ? Abnormal liver function   ? Adenomatous colon polyp   ? Arthritis   ? Bigeminy   ? Colonic polyp   ? Dermatitis   ? Diabetes mellitus   ? Diverticulitis   ? recurrent  ? DVT (deep venous thrombosis) ([place])   ? Esophageal stricture   ? GERD (gastroesophageal reflux disease)   ? Hemorrhoids   ? Hyperlipidemia   ? Hypertension   ? OSA (obstructive sleep apnea) 07/09/2016  ? last PSG showed no significant OSA  ? PVC's (premature ventricular contractions)   ? ? ?Past Surgical History:  ?Procedure Laterality Date  ? ABDOMINAL HYSTERECTOMY    ? CHOLECYSTECTOMY    ? aprox 10 years ago  ? EYE SURGERY    ? right and left eye  ? KNEE SURGERY    ? Right  ? KNEE SURGERY    ? Left  ? TOTAL HIP ARTHROPLASTY Right 11/24/2018  ? Procedure: TOTAL HIP ARTHROPLASTY ANTERIOR APPROACH;  Surgeon: Dorna Leitz, MD;  Location: [facility] ORS;  Service: Orthopedics;  Laterality: Right;  ? ? ?Current Medications: ?Current Meds  ?Medication Sig  ? acetaminophen (TYLENOL) 500 MG  tablet Take 1,000 mg by mouth every 6 (six) hours as needed for moderate pain or headache.  ? alendronate (FOSAMAX) 70 MG tablet PLEASE SEE ATTACHED FOR DETAILED DIRECTIONS  ? ALPRAZolam (XANAX) 0.25 MG tablet Take 0.125-0.25 mg by mouth daily as needed for anxiety.  ? aspirin 81 MG tablet Take 81 mg by mouth daily.  ? BD PEN NEEDLE NANO U/F 32G X 4 MM MISC   ? cholecalciferol (VITAMIN D3) 25 MCG (1000 UT) tablet Take 1,000 Units by mouth daily.  ? diltiazem (CARDIZEM CD) 240 MG 24 hr capsule Take 1 capsule (240 mg total) by mouth daily.  ? hydrochlorothiazide (HYDRODIURIL) 25 MG tablet Take 25 mg by mouth daily.  ? insulin aspart (NOVOLOG) 100 UNIT/ML injection Inject 42-54 Units into the skin See admin instructions. Inject 42 units at breakfast and at lunch and 44-55 units at supper  ? insulin glargine (LANTUS) 100 UNIT/ML injection Inject 24 Units into the skin daily.  ? omeprazole (PRILOSEC) 20 MG capsule Take 20 mg by mouth daily.  ? pravastatin (PRAVACHOL) 40 MG tablet Take 40 mg by mouth at bedtime.   ? ramipril (ALTACE) 10 MG  capsule Take 1 capsule by mouth 2 (two) times daily.  ? [DISCONTINUED] diltiazem (CARDIZEM CD) 360 MG 24 hr capsule TAKE 1 CAPSULE BY MOUTH EVERY DAY  ?  ? ?Allergies:   Metoprolol, Nortriptyline, Olmesartan, Tradjenta [linagliptin], Alatrofloxacin, Beta adrenergic blockers, Cephalexin, Codeine, Crestor [rosuvastatin calcium], Flagyl [metronidazole], Fluvastatin sodium, Levemir [insulin detemir], Lisinopril, Sulfamethoxazole-trimethoprim, Verapamil, Welchol [colesevelam hcl], Adhesive [tape], Sitagliptin, and Trovan [trovafloxacin]  ? ?Social History  ? ?Socioeconomic History  ? Marital status: Married  ?  Spouse name: Not on file  ? Number of children: 2  ? Years of education: 96  ? Highest education level: Not on file  ?Occupational History  ? Occupation: Retired  ?Tobacco Use  ? Smoking status: Never  ? Smokeless tobacco: Never  ?Vaping Use  ? Vaping Use: Never used  ?Substance and  Sexual Activity  ? Alcohol use: No  ?  Comment: used to drink 30 yrs ago  ? Drug use: No  ? Sexual activity: Not on file  ?Other Topics Concern  ? Not on file  ?Social History Narrative  ? Regular exercise-yes  ? Caffeine use-yes  ? Lives with Husband and 3 graqdnsons and one is adopted  ? Husband drinks a lot-her husband drinks and smokes a lot  ? Used to be be married and is currently divorced  ? Daughters are on drugs  ? ?Social Determinants of Health  ? ?Financial Resource Strain: Not on file  ?Food Insecurity: Not on file  ?Transportation Needs: Not on file  ?Physical Activity: Not on file  ?Stress: Not on file  ?Social Connections: Not on file  ?  ? ?Family History: ?The patient's family history includes Cancer in her daughter; Diabetes in an other family member; Heart attack (age of onset: 39) in her mother; Heart attack (age of onset: 44) in her maternal grandfather; Heart attack (age of onset: 53) in her maternal grandmother; Heart attack (age of onset: 84) in her brother; Heart disease in an other family member. ? ?ROS:   ?Please see the history of present illness.    ? All other systems reviewed and are negative. ? ?EKGs/Labs/Other Studies Reviewed:   ? ? ? ?EKG: January 20, 2022: Normal sinus rhythm at 80.  Poor R wave progression-likely lead placement.  No acute changes. ? ?Recent Labs: ?No results found for requested labs within last 8760 hours.  ?Recent Lipid Panel ?   ?Component Value Date/Time  ? CHOL  08/25/2010 0404  ?  194        ?ATP III CLASSIFICATION: ? <200     mg/dL   Desirable ? 200-239  mg/dL   Borderline High ? >=240    mg/dL   High ?        ? TRIG 175 (H) 08/25/2010 0404  ? HDL 38 (L) 08/25/2010 0404  ? CHOLHDL 5.1 08/25/2010 0404  ? VLDL 35 08/25/2010 0404  ? [place] (H) 08/25/2010 0404  ?  121        ?Total Cholesterol/HDL:CHD Risk ?Coronary Heart Disease Risk Table ?                    Men   Women ? 1/2 Average Risk   3.4   3.3 ? Average Risk       5.0   4.4 ? 2 X Average Risk   9.6    7.1 ? 3 X Average Risk  23.4   11.0 ?       ?Use the calculated  Patient Ratio ?above and the CHD Risk Table ?to determine the patient's CHD Risk. ?       ?ATP III CLASSIFICATION (LDL): ? <100     mg/dL   Optimal ? 100-129  mg/dL   Near or Above ?                   Optimal ? 130-159  mg/dL   Borderline ? 160-189  mg/dL   High ? >190     mg/dL   Very High  ? ? ? ?Risk Assessment/Calculations:   ? ? ?    ? ?Physical Exam:   ? ?VS:  BP (!) 150/72 (BP Location: Left Arm, Patient Position: Sitting, Cuff Size: Normal)   Pulse 80   Ht '5\' 7"'$  (1.702 m)   Wt 212 lb (96.2 kg)   SpO2 96%   BMI 33.20 kg/m?    ? ?Wt Readings from Last 3 Encounters:  ?01/20/22 212 lb (96.2 kg)  ?11/17/21 211 lb (95.7 kg)  ?06/01/21 212 lb 9.6 oz (96.4 kg)  ?  ? ?GEN:  Well nourished, well developed in no acute distress ?HEENT: Normal ?NECK: No JVD; No carotid bruits ?LYMPHATICS: No lymphadenopathy ?CARDIAC: RRR, no murmurs, rubs, gallops ?RESPIRATORY:  Clear to auscultation without rales, wheezing or rhonchi  ?ABDOMEN: Soft, non-tender, non-distended ?MUSCULOSKELETAL:  No edema; No deformity  ?SKIN: Warm and dry ?NEUROLOGIC:  Alert and oriented x 3 ?PSYCHIATRIC:  Normal affect  ? ?ASSESSMENT:   ? ?1. Syncope, unspecified syncope type   ?2. Bradycardia   ? ?PLAN:   ? ?In order of problems listed above: ? ?Symptomatic bradycardia: Alice Cole presents today for evaluation and management of some symptomatic bradycardia.  She felt very weak several days ago.  She measured her blood pressure in the 30s.  Later it was in the 39s.  She feels very weak and dizzy with these episodes.  She is on a high dose of Cardizem.  It is likely that she has some degree of sick sinus syndrome. ?We will reduce her Cardizem to 240 mg a day.  We will place a 14-day Zio patch monitor on her for further evaluation.  Wil have Dr. Radford Pax read that monitor   She has an appointment to follow-up with Dr. Radford Pax next month. ? ? ?? Obstructive sleep apnea:   She was diagnosed as  having obstructive sleep apnea in the past but somehow did not qualify to keep her CPAP mask.  We will defer to Dr. Radford Pax for this issue. ?   ? ?   ? ? ?Medication Adjustments/Labs and Tests Ordered: ?Current me

## 2022-01-25 ENCOUNTER — Other Ambulatory Visit: Payer: Self-pay | Admitting: Cardiology

## 2022-02-08 DIAGNOSIS — Y92009 Unspecified place in unspecified non-institutional (private) residence as the place of occurrence of the external cause: Secondary | ICD-10-CM | POA: Diagnosis not present

## 2022-02-08 DIAGNOSIS — W19XXXA Unspecified fall, initial encounter: Secondary | ICD-10-CM | POA: Diagnosis not present

## 2022-02-08 DIAGNOSIS — E1122 Type 2 diabetes mellitus with diabetic chronic kidney disease: Secondary | ICD-10-CM | POA: Diagnosis not present

## 2022-02-08 DIAGNOSIS — N1831 Chronic kidney disease, stage 3a: Secondary | ICD-10-CM | POA: Diagnosis not present

## 2022-02-08 DIAGNOSIS — M8580 Other specified disorders of bone density and structure, unspecified site: Secondary | ICD-10-CM | POA: Diagnosis not present

## 2022-02-08 DIAGNOSIS — I10 Essential (primary) hypertension: Secondary | ICD-10-CM | POA: Diagnosis not present

## 2022-02-08 DIAGNOSIS — Z794 Long term (current) use of insulin: Secondary | ICD-10-CM | POA: Diagnosis not present

## 2022-02-08 DIAGNOSIS — F419 Anxiety disorder, unspecified: Secondary | ICD-10-CM | POA: Diagnosis not present

## 2022-02-09 ENCOUNTER — Other Ambulatory Visit: Payer: Self-pay | Admitting: Family Medicine

## 2022-02-09 DIAGNOSIS — M8580 Other specified disorders of bone density and structure, unspecified site: Secondary | ICD-10-CM

## 2022-02-12 ENCOUNTER — Telehealth: Payer: Self-pay | Admitting: Cardiology

## 2022-02-12 DIAGNOSIS — R296 Repeated falls: Secondary | ICD-10-CM | POA: Diagnosis not present

## 2022-02-12 DIAGNOSIS — R001 Bradycardia, unspecified: Secondary | ICD-10-CM

## 2022-02-12 DIAGNOSIS — R55 Syncope and collapse: Secondary | ICD-10-CM | POA: Diagnosis not present

## 2022-02-12 DIAGNOSIS — I442 Atrioventricular block, complete: Secondary | ICD-10-CM

## 2022-02-12 NOTE — Telephone Encounter (Signed)
Received call from [facility] in reference to final monitor result. ?Concerning with 1108 episodes CHB, min HR 28. ? ? ?Will forward note to MD and her nurse to review/advise. ? ? ?

## 2022-02-12 NOTE — Telephone Encounter (Signed)
[employer] calling with critical EKG results  ? ?Reference # [id number] ?

## 2022-02-12 NOTE — Telephone Encounter (Signed)
Results reviewed by Dr. Ali Lowe who also called and spoke to the patient. Her episodes of heart block were during sleep. She typically wakes up at 8am. She still has had some episodes of dizziness which she stated have improved. Heart monitor looked good during symptomatic episodes. Patient was advised to report to ER for any severe dizziness or syncope. Per DOD, Dr. Chauncey Cruel will refer the patient to EP for consult for a pacemaker.  ?

## 2022-02-18 ENCOUNTER — Other Ambulatory Visit: Payer: HMO

## 2022-02-22 ENCOUNTER — Ambulatory Visit (INDEPENDENT_AMBULATORY_CARE_PROVIDER_SITE_OTHER): Payer: HMO | Admitting: Cardiology

## 2022-02-22 ENCOUNTER — Encounter: Payer: Self-pay | Admitting: Cardiology

## 2022-02-22 VITALS — BP 142/66 | HR 99 | Ht 67.0 in | Wt 209.6 lb

## 2022-02-22 DIAGNOSIS — I442 Atrioventricular block, complete: Secondary | ICD-10-CM

## 2022-02-22 NOTE — Progress Notes (Signed)
? ?Electrophysiology Office Note ? ? ?Date:  02/22/2022  ? ?ID:  Alice Cole, DOB August 05, 1946, MRN [id number] ? ?PCP:  [facility]  ?Cardiologist:  Radford Pax ?Primary Electrophysiologist:  Aarsh Fristoe Meredith Leeds, MD   ? ?Chief Complaint: palpitations, near syncope ?  ?History of Present Illness: ?Alice Cole is a 76 y.o. female who is being seen today for the evaluation of palpitations, near syncope at the request of Turner, Eber Hong, MD. Presenting today for electrophysiology evaluation. ? ?She has a history significant for diabetes, DVT, hypertension, hyperlipidemia, obstructive sleep apnea, PVCs.  She presented to cardiology clinic when she became very weak and dizzy and measured her heart rate in the 30s.  She is currently on diltiazem.  She wore a cardiac monitor that showed episodes of heart block that were nocturnal. ? ?Today, she denies symptoms of palpitations, chest pain, shortness of breath, orthopnea, PND, lower extremity edema, claudication, dizziness, presyncope, syncope, bleeding, or neurologic sequela. The patient is tolerating medications without difficulties.  ? ? ?Past Medical History:  ?Diagnosis Date  ? Abnormal EKG 07/09/2016  ? Abnormal liver function   ? Adenomatous colon polyp   ? Arthritis   ? Bigeminy   ? Colonic polyp   ? Dermatitis   ? Diabetes mellitus   ? Diverticulitis   ? recurrent  ? DVT (deep venous thrombosis) ([place])   ? Esophageal stricture   ? GERD (gastroesophageal reflux disease)   ? Hemorrhoids   ? Hyperlipidemia   ? Hypertension   ? OSA (obstructive sleep apnea) 07/09/2016  ? last PSG showed no significant OSA  ? PVC's (premature ventricular contractions)   ? ?Past Surgical History:  ?Procedure Laterality Date  ? ABDOMINAL HYSTERECTOMY    ? CHOLECYSTECTOMY    ? aprox 10 years ago  ? EYE SURGERY    ? right and left eye  ? KNEE SURGERY    ? Right  ? KNEE SURGERY    ? Left  ? TOTAL HIP ARTHROPLASTY Right 11/24/2018  ? Procedure: TOTAL HIP ARTHROPLASTY ANTERIOR  APPROACH;  Surgeon: Dorna Leitz, MD;  Location: [facility];  Service: Orthopedics;  Laterality: Right;  ? ? ? ?Current Outpatient Medications  ?Medication Sig Dispense Refill  ? acetaminophen (TYLENOL) 500 MG tablet Take 1,000 mg by mouth every 6 (six) hours as needed for moderate pain or headache.    ? alendronate (FOSAMAX) 70 MG tablet PLEASE SEE ATTACHED FOR DETAILED DIRECTIONS    ? ALPRAZolam (XANAX) 0.25 MG tablet Take 0.125-0.25 mg by mouth daily as needed for anxiety.    ? aspirin 81 MG tablet Take 81 mg by mouth daily.    ? BD PEN NEEDLE NANO U/F 32G X 4 MM MISC     ? cholecalciferol (VITAMIN D3) 25 MCG (1000 UT) tablet Take 1,000 Units by mouth daily.    ? diltiazem (CARDIZEM CD) 240 MG 24 hr capsule Take 1 capsule (240 mg total) by mouth daily. 90 capsule 3  ? hydrochlorothiazide (HYDRODIURIL) 25 MG tablet Take 25 mg by mouth daily.    ? insulin aspart (NOVOLOG) 100 UNIT/ML injection Inject 42-54 Units into the skin See admin instructions. Inject 42 units at breakfast and at lunch and 44-55 units at supper    ? insulin glargine (LANTUS) 100 UNIT/ML injection Inject 24 Units into the skin daily.    ? omeprazole (PRILOSEC) 20 MG capsule Take 20 mg by mouth daily.    ? pravastatin (PRAVACHOL) 40 MG tablet Take 40 mg  by mouth at bedtime.   11  ? ramipril (ALTACE) 10 MG capsule Take 1 capsule by mouth 2 (two) times daily.    ? irbesartan (AVAPRO) 150 MG tablet Take 1 tablet (150 mg total) by mouth daily. (Patient not taking: Reported on 02/22/2022) 30 tablet 11  ? ?No current facility-administered medications for this visit.  ? ? ?Allergies:   Metoprolol, Nortriptyline, Olmesartan, Tradjenta [linagliptin], Alatrofloxacin, Beta adrenergic blockers, Cardizem [diltiazem], Cephalexin, Codeine, Colesevelam hcl, Crestor [rosuvastatin calcium], Flagyl [metronidazole], Fluvastatin sodium, Iodinated contrast media, Levemir [insulin detemir], Lisinopril, Penicillin g, Rosuvastatin, Sulfamethoxazole-trimethoprim, Verapamil,  Adhesive [tape], Sitagliptin, and Trovan [trovafloxacin]  ? ?Social History:  The patient  reports that she has never smoked. She has never used smokeless tobacco. She reports that she does not drink alcohol and does not use drugs.  ? ?Family History:  The patient's family history includes Cancer in her daughter; Diabetes in an other family member; Heart attack (age of onset: 73) in her mother; Heart attack (age of onset: 42) in her maternal grandfather; Heart attack (age of onset: 69) in her maternal grandmother; Heart attack (age of onset: 101) in her brother; Heart disease in an other family member.  ? ? ?ROS:  Please see the history of present illness.   Otherwise, review of systems is positive for none.   All other systems are reviewed and negative.  ? ? ?PHYSICAL EXAM: ?VS:  BP (!) 142/66   Pulse 99   Ht '5\' 7"'$  (1.702 m)   Wt 209 lb 9.6 oz (95.1 kg)   SpO2 96%   BMI 32.83 kg/m?  , BMI Body mass index is 32.83 kg/m?. ?GEN: Well nourished, well developed, in no acute distress  ?HEENT: normal  ?Neck: no JVD, carotid bruits, or masses ?Cardiac: RRR; no murmurs, rubs, or gallops,no edema  ?Respiratory:  clear to auscultation bilaterally, normal work of breathing ?GI: soft, nontender, nondistended, + BS ?MS: no deformity or atrophy  ?Skin: warm and dry ?Neuro:  Strength and sensation are intact ?Psych: euthymic mood, full affect ? ?EKG:  EKG is not ordered today. ?Personal review of the ekg ordered 01/20/22 shows sinus rhythm, rate 80 ? ?Recent Labs: ?No results found for requested labs within last 8760 hours.  ? ? ?Lipid Panel  ?   ?Component Value Date/Time  ? CHOL  08/25/2010 0404  ?  194        ?ATP III CLASSIFICATION: ? <200     mg/dL   Desirable ? 200-239  mg/dL   Borderline High ? >=240    mg/dL   High ?        ? TRIG 175 (H) 08/25/2010 0404  ? HDL 38 (L) 08/25/2010 0404  ? CHOLHDL 5.1 08/25/2010 0404  ? VLDL 35 08/25/2010 0404  ? [place] (H) 08/25/2010 0404  ?  121        ?Total Cholesterol/HDL:CHD  Risk ?Coronary Heart Disease Risk Table ?                    Men   Women ? 1/2 Average Risk   3.4   3.3 ? Average Risk       5.0   4.4 ? 2 X Average Risk   9.6   7.1 ? 3 X Average Risk  23.4   11.0 ?       ?Use the calculated Patient Ratio ?above and the CHD Risk Table ?to determine the patient's CHD Risk. ?       ?  ATP III CLASSIFICATION (LDL): ? <100     mg/dL   Optimal ? 100-129  mg/dL   Near or Above ?                   Optimal ? 130-159  mg/dL   Borderline ? 160-189  mg/dL   High ? >190     mg/dL   Very High  ? ? ? ?Wt Readings from Last 3 Encounters:  ?02/22/22 209 lb 9.6 oz (95.1 kg)  ?01/20/22 212 lb (96.2 kg)  ?11/17/21 211 lb (95.7 kg)  ?  ? ? ?Other studies Reviewed: ?Additional studies/ records that were reviewed today include: TTE 04/01/21  ?Review of the above records today demonstrates:  ? 1. Left ventricular ejection fraction, by estimation, is 70 to 75%. The  ?left ventricle has hyperdynamic function. The left ventricle has no  ?regional wall motion abnormalities. There is moderate asymmetric left  ?ventricular hypertrophy of the basal-septal  ? segment. Left ventricular diastolic parameters were normal.  ? 2. Right ventricular systolic function is normal. The right ventricular  ?size is normal. Tricuspid regurgitation signal is inadequate for assessing  ?PA pressure.  ? 3. The mitral valve is normal in structure. No evidence of mitral valve  ?regurgitation. No evidence of mitral stenosis.  ? 4. The aortic valve was not well visualized. Aortic valve regurgitation  ?is not visualized. Mild to moderate aortic valve sclerosis/calcification  ?is present, without any evidence of aortic stenosis.  ? ?Cardiac monitor 02/12/2022 personally reviewed ?Predominant rhythm was normal sinus rhythm with an average heart rate of 76 bpm and ranged from 49 to 127 bpm. ?Rare PACs ?Rare PVCs, ventricular couplets and triplets as well as ventricular bigeminy and trigeminy. PVC load less than 1%. ?Occasional accelerated  junctional rhythm ?Intermittent complete heart block occurring during sleep at 49 bpm lasting up to 7.9 seconds in duration with ventricular escape rhythm ? ?ASSESSMENT AND PLAN: ? ?1.  PVCs: Burden of less than 1%.  Cornelius Moras

## 2022-02-23 ENCOUNTER — Telehealth: Payer: Self-pay | Admitting: *Deleted

## 2022-02-23 ENCOUNTER — Encounter: Payer: Self-pay | Admitting: Cardiology

## 2022-02-23 ENCOUNTER — Ambulatory Visit (INDEPENDENT_AMBULATORY_CARE_PROVIDER_SITE_OTHER): Payer: HMO | Admitting: Cardiology

## 2022-02-23 VITALS — BP 144/72 | HR 96 | Ht 67.0 in | Wt 209.0 lb

## 2022-02-23 DIAGNOSIS — G4733 Obstructive sleep apnea (adult) (pediatric): Secondary | ICD-10-CM

## 2022-02-23 DIAGNOSIS — I10 Essential (primary) hypertension: Secondary | ICD-10-CM | POA: Diagnosis not present

## 2022-02-23 DIAGNOSIS — R600 Localized edema: Secondary | ICD-10-CM

## 2022-02-23 DIAGNOSIS — I493 Ventricular premature depolarization: Secondary | ICD-10-CM | POA: Diagnosis not present

## 2022-02-23 DIAGNOSIS — R0609 Other forms of dyspnea: Secondary | ICD-10-CM | POA: Diagnosis not present

## 2022-02-23 NOTE — Telephone Encounter (Signed)
Pt seen in the office today with Dr. Radford Pax who ordered an Itamar study. Per pt she will have her son and grandsons help upload that watch pat application onto her phone. I did review the tutorial with the pt with verbal understanding from the pt. Pt aware to not open the box until she has been called with PIN #. Pt aware if study not done in 30 days from approval she will be billed $100, also if the device is to be returned and is opened, she will be charged $100. Pt agreeable to plan of care.  ? ?SET UP DATE 02/23/22 ?

## 2022-02-23 NOTE — Progress Notes (Signed)
?Cardiology Office Note:   ? ?Date:  02/23/2022  ? ?ID:  Alice Cole, DOB 19-Jan-1946, MRN [id number] ? ?PCP:  [facility]  ?Cardiologist:  Fransico Him, MD   ? ?Referring MD: Maurice Small, MD  ? ?Chief Complaint  ?Patient presents with  ? Follow-up  ?  PVCs, hypertension, intermittent complete heart block at night, lower extremity edema, shortness of breath  ? ? ?History of Present Illness:   ? ?Alice Cole is a 76 y.o. female with a hx of OSA/PVCs and HTN.   She has a history of PVCs and normal coronary arteries by cath in 2003. Nuclear stress test showed no ischemia in 2017 and 2D echo showed normal LVF with diastolic dysfunction.  She has chronic DOE felt secondary to obesity, poorly controlled hypertension, deconditioning and DD. PFTs were abnormal with mild to moderate restriction and she was referred to pulmonary. High-resolution CT 02/2017 showed no evidence of interstitial lung disease and tiny 2-3 mm pulmonary nodules on the right. She was referred to pulmonary rehabilitation.  ?  ?She underwent repeat sleep study 11/2017 due to non compliance with her PAP and this showed no significant OSA overall with an AHI of 3.7/hr and moderate OSA during REM sleep with an [facility] of 24/hr.  Her insurance would not pay for CPAP since her overall AHI was < 5 despite an elevated AHI during REM sleep.   ? ?She was seen by Dr. Curt Bears yesterday due to having problems with feeling weak and dizzy and took her heart rate was 30 bpm.  She had worn a cardiac monitor that had showed episodes of heart block that were nocturnal.She was asymptomatic when she saw him yesterday.  Her heart block was intermittent but all were nocturnal with no evidence of significant bradycardia down to the 30s.  And no prolonged episodes of heart block.  It was recommended that we continue to watch and monitor. ? ?She is here today for followup and is doing well.  She has chronic DOE related to obesity and sedentary state but it has  improved after she started riding an exercise bike. She denies any chest pain or pressure, PND, orthopnea, LE edema, dizziness or syncope. She is compliant with her meds and is tolerating meds with no SE.    ? ?Past Medical History:  ?Diagnosis Date  ? Abnormal EKG 07/09/2016  ? Abnormal liver function   ? Adenomatous colon polyp   ? Arthritis   ? Bigeminy   ? Colonic polyp   ? Dermatitis   ? Diabetes mellitus   ? Diverticulitis   ? recurrent  ? DVT (deep venous thrombosis) ([place])   ? Esophageal stricture   ? GERD (gastroesophageal reflux disease)   ? Hemorrhoids   ? Hyperlipidemia   ? Hypertension   ? OSA (obstructive sleep apnea) 07/09/2016  ? last PSG showed no significant OSA  ? PVC's (premature ventricular contractions)   ? ? ?Past Surgical History:  ?Procedure Laterality Date  ? ABDOMINAL HYSTERECTOMY    ? CHOLECYSTECTOMY    ? aprox 10 years ago  ? EYE SURGERY    ? right and left eye  ? KNEE SURGERY    ? Right  ? KNEE SURGERY    ? Left  ? TOTAL HIP ARTHROPLASTY Right 11/24/2018  ? Procedure: TOTAL HIP ARTHROPLASTY ANTERIOR APPROACH;  Surgeon: Dorna Leitz, MD;  Location: [facility];  Service: Orthopedics;  Laterality: Right;  ? ? ?Current Medications: ?Current Meds  ?Medication  Sig  ? acetaminophen (TYLENOL) 500 MG tablet Take 1,000 mg by mouth every 6 (six) hours as needed for moderate pain or headache.  ? alendronate (FOSAMAX) 70 MG tablet PLEASE SEE ATTACHED FOR DETAILED DIRECTIONS  ? ALPRAZolam (XANAX) 0.25 MG tablet Take 0.125-0.25 mg by mouth daily as needed for anxiety.  ? aspirin 81 MG tablet Take 81 mg by mouth daily.  ? BD PEN NEEDLE NANO U/F 32G X 4 MM MISC   ? cholecalciferol (VITAMIN D3) 25 MCG (1000 UT) tablet Take 1,000 Units by mouth daily.  ? diltiazem (CARDIZEM CD) 240 MG 24 hr capsule Take 1 capsule (240 mg total) by mouth daily.  ? hydrochlorothiazide (HYDRODIURIL) 25 MG tablet Take 25 mg by mouth daily.  ? insulin aspart (NOVOLOG) 100 UNIT/ML injection Inject 42-54 Units into the skin See admin  instructions. Inject 42 units at breakfast and at lunch and 44-55 units at supper  ? insulin glargine (LANTUS) 100 UNIT/ML injection Inject 24 Units into the skin daily.  ? omeprazole (PRILOSEC) 20 MG capsule Take 20 mg by mouth daily.  ? pravastatin (PRAVACHOL) 40 MG tablet Take 40 mg by mouth at bedtime.   ? ramipril (ALTACE) 10 MG capsule Take 1 capsule by mouth 2 (two) times daily.  ? [DISCONTINUED] irbesartan (AVAPRO) 150 MG tablet Take 1 tablet (150 mg total) by mouth daily.  ?  ? ?Allergies:   Metoprolol, Nortriptyline, Olmesartan, Tradjenta [linagliptin], Alatrofloxacin, Beta adrenergic blockers, Cardizem [diltiazem], Cephalexin, Codeine, Colesevelam hcl, Crestor [rosuvastatin calcium], Flagyl [metronidazole], Fluvastatin sodium, Iodinated contrast media, Levemir [insulin detemir], Lisinopril, Penicillin g, Rosuvastatin, Sulfamethoxazole-trimethoprim, Verapamil, Adhesive [tape], Sitagliptin, and Trovan [trovafloxacin]  ? ?Social History  ? ?Socioeconomic History  ? Marital status: Married  ?  Spouse name: Not on file  ? Number of children: 2  ? Years of education: 83  ? Highest education level: Not on file  ?Occupational History  ? Occupation: Retired  ?Tobacco Use  ? Smoking status: Never  ? Smokeless tobacco: Never  ?Vaping Use  ? Vaping Use: Never used  ?Substance and Sexual Activity  ? Alcohol use: No  ?  Comment: used to drink 30 yrs ago  ? Drug use: No  ? Sexual activity: Not on file  ?Other Topics Concern  ? Not on file  ?Social History Narrative  ? Regular exercise-yes  ? Caffeine use-yes  ? Lives with Husband and 3 graqdnsons and one is adopted  ? Husband drinks a lot-her husband drinks and smokes a lot  ? Used to be be married and is currently divorced  ? Daughters are on drugs  ? ?Social Determinants of Health  ? ?Financial Resource Strain: Not on file  ?Food Insecurity: Not on file  ?Transportation Needs: Not on file  ?Physical Activity: Not on file  ?Stress: Not on file  ?Social Connections: Not  on file  ?  ? ?Family History: ?The patient's family history includes Cancer in her daughter; Diabetes in an other family member; Heart attack (age of onset: 63) in her mother; Heart attack (age of onset: 62) in her maternal grandfather; Heart attack (age of onset: 53) in her maternal grandmother; Heart attack (age of onset: 2) in her brother; Heart disease in an other family member. ? ?ROS:   ?Please see the history of present illness.    ?ROS  ?All other systems reviewed and negative.  ? ?EKGs/Labs/Other Studies Reviewed:   ? ?The following studies were reviewed today: ?Outside labs from PCP ? ?EKG:  EKG is  not ordered today.  ? ?Recent Labs: ?No results found for requested labs within last 8760 hours.  ? ?Recent Lipid Panel ?   ?Component Value Date/Time  ? CHOL  08/25/2010 0404  ?  194        ?ATP III CLASSIFICATION: ? <200     mg/dL   Desirable ? 200-239  mg/dL   Borderline High ? >=240    mg/dL   High ?        ? TRIG 175 (H) 08/25/2010 0404  ? HDL 38 (L) 08/25/2010 0404  ? CHOLHDL 5.1 08/25/2010 0404  ? VLDL 35 08/25/2010 0404  ? [place] (H) 08/25/2010 0404  ?  121        ?Total Cholesterol/HDL:CHD Risk ?Coronary Heart Disease Risk Table ?                    Men   Women ? 1/2 Average Risk   3.4   3.3 ? Average Risk       5.0   4.4 ? 2 X Average Risk   9.6   7.1 ? 3 X Average Risk  23.4   11.0 ?       ?Use the calculated Patient Ratio ?above and the CHD Risk Table ?to determine the patient's CHD Risk. ?       ?ATP III CLASSIFICATION (LDL): ? <100     mg/dL   Optimal ? 100-129  mg/dL   Near or Above ?                   Optimal ? 130-159  mg/dL   Borderline ? 160-189  mg/dL   High ? >190     mg/dL   Very High  ? ? ?Physical Exam:   ? ?VS:  BP (!) 144/72   Pulse 96   Ht '5\' 7"'$  (1.702 m)   Wt 209 lb (94.8 kg)   SpO2 98%   BMI 32.73 kg/m?    ? ?Wt Readings from Last 3 Encounters:  ?02/23/22 209 lb (94.8 kg)  ?02/22/22 209 lb 9.6 oz (95.1 kg)  ?01/20/22 212 lb (96.2 kg)  ?  ? ?GEN: Well nourished, well developed  in no acute distress ?HEENT: Normal ?NECK: No JVD; No carotid bruits ?LYMPHATICS: No lymphadenopathy ?CARDIAC:RRR, no murmurs, rubs, gallops ?RESPIRATORY:  Clear to auscultation without rales, wheezing or r

## 2022-02-23 NOTE — Addendum Note (Signed)
Addended by: Antonieta Iba on: 02/23/2022 10:42 AM ? ? Modules accepted: Orders ? ?

## 2022-02-23 NOTE — Patient Instructions (Signed)
Medication Instructions:  ?Your physician recommends that you continue on your current medications as directed. Please refer to the Current Medication list given to you today. ? ?*If you need a refill on your cardiac medications before your next appointment, please call your pharmacy* ? ?Testing/Procedures: ?Your physician has recommended that you have a sleep study. This test records several body functions during sleep, including: brain activity, eye movement, oxygen and carbon dioxide blood levels, heart rate and rhythm, breathing rate and rhythm, the flow of air through your mouth and nose, snoring, body muscle movements, and chest and belly movement. ? ?Follow-Up: ?At [facility], you and your health needs are our priority.  As part of our continuing mission to provide you with exceptional heart care, we have created designated Provider Care Teams.  These Care Teams include your primary Cardiologist (physician) and Advanced Practice Providers (APPs -  Physician Assistants and Nurse Practitioners) who all work together to provide you with the care you need, when you need it. ? ?Your next appointment:   ?6 month(s) ? ?The format for your next appointment:   ?In Person ? ?Provider:   ?Fransico Him, MD   ? ?Important Information About Sugar ? ? ? ? ?  ?

## 2022-03-16 DIAGNOSIS — M25551 Pain in right hip: Secondary | ICD-10-CM | POA: Diagnosis not present

## 2022-03-16 DIAGNOSIS — M1712 Unilateral primary osteoarthritis, left knee: Secondary | ICD-10-CM | POA: Diagnosis not present

## 2022-03-16 DIAGNOSIS — M1711 Unilateral primary osteoarthritis, right knee: Secondary | ICD-10-CM | POA: Diagnosis not present

## 2022-03-17 NOTE — Telephone Encounter (Signed)
Message sent to [employer]. CMA to check on Itamar approval.

## 2022-03-22 NOTE — Telephone Encounter (Signed)
Message sent gain today in regard to if the pt has been approved or not yet for her Itamar study.

## 2022-03-23 NOTE — Telephone Encounter (Addendum)
Prior Authorization for [facility] sent to [facility] via web portal.  APPROVED-AUTH# [id number] .

## 2022-03-24 DIAGNOSIS — Z136 Encounter for screening for cardiovascular disorders: Secondary | ICD-10-CM | POA: Diagnosis not present

## 2022-03-24 DIAGNOSIS — M170 Bilateral primary osteoarthritis of knee: Secondary | ICD-10-CM | POA: Diagnosis not present

## 2022-03-24 DIAGNOSIS — I10 Essential (primary) hypertension: Secondary | ICD-10-CM | POA: Diagnosis not present

## 2022-03-24 DIAGNOSIS — Z1159 Encounter for screening for other viral diseases: Secondary | ICD-10-CM | POA: Diagnosis not present

## 2022-03-24 DIAGNOSIS — Z0000 Encounter for general adult medical examination without abnormal findings: Secondary | ICD-10-CM | POA: Diagnosis not present

## 2022-03-24 DIAGNOSIS — E1165 Type 2 diabetes mellitus with hyperglycemia: Secondary | ICD-10-CM | POA: Diagnosis not present

## 2022-03-24 DIAGNOSIS — N1831 Chronic kidney disease, stage 3a: Secondary | ICD-10-CM | POA: Diagnosis not present

## 2022-03-24 NOTE — Telephone Encounter (Signed)
DPR ok to leave message. Left detailed message for the pt that she has been approved for Itamar study and ok to proceed. PIN # [id number]. Left message if I she may try to do sleep study between tonight or by the weekend. If not please call the office and ask to s/w Arbie Cookey, if she may be able to sleep study early next week if not this week.   I did apologize for our delay in getting the approval.

## 2022-03-26 NOTE — Telephone Encounter (Signed)
Patient called, read her message below. She stated she will do home sleep study this weekend.

## 2022-04-05 ENCOUNTER — Encounter (INDEPENDENT_AMBULATORY_CARE_PROVIDER_SITE_OTHER): Payer: HMO | Admitting: Cardiology

## 2022-04-05 DIAGNOSIS — G4733 Obstructive sleep apnea (adult) (pediatric): Secondary | ICD-10-CM | POA: Diagnosis not present

## 2022-04-06 ENCOUNTER — Ambulatory Visit: Payer: HMO

## 2022-04-06 DIAGNOSIS — I10 Essential (primary) hypertension: Secondary | ICD-10-CM

## 2022-04-06 DIAGNOSIS — R600 Localized edema: Secondary | ICD-10-CM

## 2022-04-06 DIAGNOSIS — I493 Ventricular premature depolarization: Secondary | ICD-10-CM

## 2022-04-06 DIAGNOSIS — R0609 Other forms of dyspnea: Secondary | ICD-10-CM

## 2022-04-06 DIAGNOSIS — G4733 Obstructive sleep apnea (adult) (pediatric): Secondary | ICD-10-CM

## 2022-04-07 ENCOUNTER — Encounter: Payer: Self-pay | Admitting: *Deleted

## 2022-04-07 DIAGNOSIS — G4733 Obstructive sleep apnea (adult) (pediatric): Secondary | ICD-10-CM

## 2022-04-07 NOTE — Telephone Encounter (Signed)
-----   Message from Lauralee Evener, [place] sent at 04/07/2022  9:02 AM EDT -----  ----- Message ----- From: Sueanne Margarita, MD Sent: 04/06/2022   1:57 PM EDT To: [facility] Sleep Studies  Please let patient know that they have sleep apnea and recommend treating with CPAP.  Please order an auto CPAP from 4-15cm H2O with heated humidity and mask of choice.   Followup with me in 6 weeks.

## 2022-04-07 NOTE — Telephone Encounter (Signed)
The patient has been notified of the result and verbalized understanding.  All questions (if any) were answered. Alice Cole, [place] 04/07/2022 5:28 PM    Upon patient request DME selection is [facility] Patient understands he will be contacted by [facility] to set up his cpap. Patient understands to call if [facility] does not contact him with new setup in a timely manner. Patient understands they will be called once confirmation has been received from [employer]/ that they have received their new machine to schedule 10 week follow up appointment.   [facility] notified of new cpap order  Please add to airview Patient was grateful for the call and thanked me.

## 2022-04-07 NOTE — Telephone Encounter (Signed)
The patient has been notified of the result and verbalized understanding.  All questions (if any) were answered. Alice Cole, [place] 04/07/2022 5:26 PM    Upon patient request DME selection is [facility] Patient understands he will be contacted by [facility] to set up his cpap. Patient understands to call if [facility] does not contact him with new setup in a timely manner. Patient understands they will be called once confirmation has been received from [employer]/ that they have received their new machine to schedule 10 week follow up appointment.   [facility] notified of new cpap order  Please add to airview Patient was grateful for the call and thanked me.   This encounter was created in error - please disregard.

## 2022-04-22 ENCOUNTER — Ambulatory Visit: Payer: HMO | Admitting: Podiatry

## 2022-05-01 ENCOUNTER — Encounter (HOSPITAL_COMMUNITY): Payer: Self-pay | Admitting: *Deleted

## 2022-05-01 ENCOUNTER — Other Ambulatory Visit: Payer: Self-pay

## 2022-05-01 ENCOUNTER — Emergency Department (HOSPITAL_COMMUNITY): Payer: HMO

## 2022-05-01 ENCOUNTER — Inpatient Hospital Stay (HOSPITAL_COMMUNITY)
Admission: EM | Admit: 2022-05-01 | Discharge: 2022-05-04 | DRG: 247 | Disposition: A | Discharge status: Home / Self Care | Payer: HMO | Attending: Internal Medicine | Admitting: Internal Medicine

## 2022-05-01 DIAGNOSIS — R072 Precordial pain: Secondary | ICD-10-CM | POA: Diagnosis present

## 2022-05-01 DIAGNOSIS — Z833 Family history of diabetes mellitus: Secondary | ICD-10-CM

## 2022-05-01 DIAGNOSIS — E669 Obesity, unspecified: Secondary | ICD-10-CM | POA: Diagnosis not present

## 2022-05-01 DIAGNOSIS — Z955 Presence of coronary angioplasty implant and graft: Secondary | ICD-10-CM

## 2022-05-01 DIAGNOSIS — I129 Hypertensive chronic kidney disease with stage 1 through stage 4 chronic kidney disease, or unspecified chronic kidney disease: Secondary | ICD-10-CM | POA: Diagnosis present

## 2022-05-01 DIAGNOSIS — Z6832 Body mass index (BMI) 32.0-32.9, adult: Secondary | ICD-10-CM | POA: Diagnosis not present

## 2022-05-01 DIAGNOSIS — Z91041 Radiographic dye allergy status: Secondary | ICD-10-CM | POA: Diagnosis not present

## 2022-05-01 DIAGNOSIS — K219 Gastro-esophageal reflux disease without esophagitis: Secondary | ICD-10-CM | POA: Diagnosis present

## 2022-05-01 DIAGNOSIS — N179 Acute kidney failure, unspecified: Secondary | ICD-10-CM

## 2022-05-01 DIAGNOSIS — Z79899 Other long term (current) drug therapy: Secondary | ICD-10-CM

## 2022-05-01 DIAGNOSIS — Z8249 Family history of ischemic heart disease and other diseases of the circulatory system: Secondary | ICD-10-CM | POA: Diagnosis not present

## 2022-05-01 DIAGNOSIS — Z881 Allergy status to other antibiotic agents status: Secondary | ICD-10-CM

## 2022-05-01 DIAGNOSIS — R9431 Abnormal electrocardiogram [ECG] [EKG]: Secondary | ICD-10-CM

## 2022-05-01 DIAGNOSIS — I10 Essential (primary) hypertension: Secondary | ICD-10-CM | POA: Diagnosis present

## 2022-05-01 DIAGNOSIS — E1122 Type 2 diabetes mellitus with diabetic chronic kidney disease: Secondary | ICD-10-CM

## 2022-05-01 DIAGNOSIS — Z7982 Long term (current) use of aspirin: Secondary | ICD-10-CM | POA: Diagnosis not present

## 2022-05-01 DIAGNOSIS — I2510 Atherosclerotic heart disease of native coronary artery without angina pectoris: Secondary | ICD-10-CM | POA: Diagnosis present

## 2022-05-01 DIAGNOSIS — I442 Atrioventricular block, complete: Secondary | ICD-10-CM | POA: Diagnosis not present

## 2022-05-01 DIAGNOSIS — Z96641 Presence of right artificial hip joint: Secondary | ICD-10-CM | POA: Diagnosis not present

## 2022-05-01 DIAGNOSIS — I493 Ventricular premature depolarization: Secondary | ICD-10-CM

## 2022-05-01 DIAGNOSIS — Z882 Allergy status to sulfonamides status: Secondary | ICD-10-CM

## 2022-05-01 DIAGNOSIS — E1165 Type 2 diabetes mellitus with hyperglycemia: Secondary | ICD-10-CM | POA: Diagnosis present

## 2022-05-01 DIAGNOSIS — I214 Non-ST elevation (NSTEMI) myocardial infarction: Principal | ICD-10-CM | POA: Diagnosis present

## 2022-05-01 DIAGNOSIS — N1831 Chronic kidney disease, stage 3a: Secondary | ICD-10-CM | POA: Diagnosis present

## 2022-05-01 DIAGNOSIS — Z86010 Personal history of colonic polyps: Secondary | ICD-10-CM

## 2022-05-01 DIAGNOSIS — G4733 Obstructive sleep apnea (adult) (pediatric): Secondary | ICD-10-CM | POA: Diagnosis not present

## 2022-05-01 DIAGNOSIS — Z885 Allergy status to narcotic agent status: Secondary | ICD-10-CM

## 2022-05-01 DIAGNOSIS — R0602 Shortness of breath: Secondary | ICD-10-CM | POA: Diagnosis not present

## 2022-05-01 DIAGNOSIS — Z90710 Acquired absence of both cervix and uterus: Secondary | ICD-10-CM | POA: Diagnosis not present

## 2022-05-01 DIAGNOSIS — Z888 Allergy status to other drugs, medicaments and biological substances status: Secondary | ICD-10-CM

## 2022-05-01 DIAGNOSIS — E871 Hypo-osmolality and hyponatremia: Secondary | ICD-10-CM | POA: Diagnosis not present

## 2022-05-01 DIAGNOSIS — E782 Mixed hyperlipidemia: Secondary | ICD-10-CM | POA: Diagnosis not present

## 2022-05-01 DIAGNOSIS — M1990 Unspecified osteoarthritis, unspecified site: Secondary | ICD-10-CM | POA: Diagnosis not present

## 2022-05-01 DIAGNOSIS — Z87892 Personal history of anaphylaxis: Secondary | ICD-10-CM

## 2022-05-01 DIAGNOSIS — I459 Conduction disorder, unspecified: Secondary | ICD-10-CM | POA: Diagnosis not present

## 2022-05-01 DIAGNOSIS — Z794 Long term (current) use of insulin: Secondary | ICD-10-CM | POA: Diagnosis not present

## 2022-05-01 DIAGNOSIS — Z9109 Other allergy status, other than to drugs and biological substances: Secondary | ICD-10-CM

## 2022-05-01 DIAGNOSIS — E785 Hyperlipidemia, unspecified: Secondary | ICD-10-CM | POA: Diagnosis present

## 2022-05-01 DIAGNOSIS — R079 Chest pain, unspecified: Secondary | ICD-10-CM | POA: Diagnosis not present

## 2022-05-01 DIAGNOSIS — I700 Atherosclerosis of aorta: Secondary | ICD-10-CM | POA: Diagnosis not present

## 2022-05-01 LAB — CBC WITH DIFFERENTIAL/PLATELET
Abs Immature Granulocytes: 0.03 10*3/uL (ref 0.00–0.07)
Basophils Absolute: 0.1 10*3/uL (ref 0.0–0.1)
Basophils Relative: 1 %
Eosinophils Absolute: 0.2 10*3/uL (ref 0.0–0.5)
Eosinophils Relative: 2 %
HCT: 38.3 % (ref 36.0–46.0)
Hemoglobin: 13 g/dL (ref 12.0–15.0)
Immature Granulocytes: 0 %
Lymphocytes Relative: 31 %
Lymphs Abs: 2.4 10*3/uL (ref 0.7–4.0)
MCH: 29.9 pg (ref 26.0–34.0)
MCHC: 33.9 g/dL (ref 30.0–36.0)
MCV: 88 fL (ref 80.0–100.0)
Monocytes Absolute: 0.9 10*3/uL (ref 0.1–1.0)
Monocytes Relative: 12 %
Neutro Abs: 4.2 10*3/uL (ref 1.7–7.7)
Neutrophils Relative %: 54 %
Platelets: 198 10*3/uL (ref 150–400)
RBC: 4.35 MIL/uL (ref 3.87–5.11)
RDW: 12.1 % (ref 11.5–15.5)
WBC: 7.8 10*3/uL (ref 4.0–10.5)
nRBC: 0 % (ref 0.0–0.2)

## 2022-05-01 LAB — COMPREHENSIVE METABOLIC PANEL
ALT: 31 U/L (ref 0–44)
AST: 30 U/L (ref 15–41)
Albumin: 3.6 g/dL (ref 3.5–5.0)
Alkaline Phosphatase: 76 U/L (ref 38–126)
Anion gap: 9 (ref 5–15)
BUN: 17 mg/dL (ref 8–23)
CO2: 23 mmol/L (ref 22–32)
Calcium: 9.3 mg/dL (ref 8.9–10.3)
Chloride: 107 mmol/L (ref 98–111)
Creatinine, Ser: 1.16 mg/dL — ABNORMAL HIGH (ref 0.44–1.00)
GFR, Estimated: 49 mL/min — ABNORMAL LOW (ref >60)
Glucose, Bld: 200 mg/dL — ABNORMAL HIGH (ref 70–99)
Potassium: 3.5 mmol/L (ref 3.5–5.1)
Sodium: 139 mmol/L (ref 135–145)
Total Bilirubin: 0.7 mg/dL (ref 0.3–1.2)
Total Protein: 6.4 g/dL — ABNORMAL LOW (ref 6.5–8.1)

## 2022-05-01 LAB — TROPONIN I (HIGH SENSITIVITY)
Troponin I (High Sensitivity): 43 ng/L — ABNORMAL HIGH (ref <18)
Troponin I (High Sensitivity): 323 ng/L (ref <18)

## 2022-05-01 LAB — MAGNESIUM: Magnesium: 1.8 mg/dL (ref 1.7–2.4)

## 2022-05-01 LAB — CBG MONITORING, ED: Glucose-Capillary: 169 mg/dL — ABNORMAL HIGH (ref 70–99)

## 2022-05-01 MED ORDER — HEPARIN (PORCINE) 25000 UT/250ML-% IV SOLN
1100.0000 [IU]/h | INTRAVENOUS | Status: DC
  Administered 2022-05-01 – 2022-05-02 (×2): 1100 [IU]/h via INTRAVENOUS
  Filled 2022-05-01 (×2): qty 250

## 2022-05-01 MED ORDER — HEPARIN BOLUS VIA INFUSION
3000.0000 [IU] | Freq: Once | INTRAVENOUS | Status: AC
  Administered 2022-05-01: 3000 [IU] via INTRAVENOUS
  Filled 2022-05-01: qty 3000

## 2022-05-01 NOTE — ED Notes (Signed)
Patient transported to X-ray 

## 2022-05-01 NOTE — Assessment & Plan Note (Addendum)
Admit to cardiac telemetry.  Continue IV heparin.  Cardiology consult pending.  Continue aspirin.  Check lipid level.  Continue statin with pravastatin 40 mg daily.  Check echo.  May need left heart catheterization. NSTEMI (non-ST elevated myocardial infarction) [place]) is a Acute illness/condition that poses a threat to life or bodily function.

## 2022-05-01 NOTE — H&P (Addendum)
History and Physical    Alice Cole XIP:[id number] DOB: 1946/01/27 DOA: 05/01/2022  DOS: the patient was seen and examined on 05/01/2022  PCP: [facility], [place]   Patient coming from: Home  I have personally briefly reviewed patient's old medical records in [place]  Chief complaint: Chest pain History present illness: 76 year old Caucasian female history of type 2 diabetes on insulin, CKD stage IIIa, hyperlipidemia, reflux, hypertension, sleep apnea on CPAP, presents to the ER from home with substernal chest pain.  She states that she got into an argument with her 51 year old grandson.  She started having substernal chest pain after he was yelling at her.  EMS was called.  She continued have chest pain until she arrived to ER.  She was given nitroglycerin.  She was given aspirin by EMS.  First troponin 43, second troponin 323.  Patient started on aspirin.  Cardiology consulted.  [facility] hospitalist contacted for admission.   ED Course: Positive troponins.  Cardiology consulted.  Recommended IV heparin.  Review of Systems:  Review of Systems  Constitutional: Negative.  Negative for chills, fever and weight loss.  HENT: Negative.    Eyes: Negative.   Respiratory: Negative.    Cardiovascular:  Positive for chest pain and palpitations. Negative for leg swelling and PND.  Gastrointestinal: Negative.   Genitourinary: Negative.   Musculoskeletal: Negative.   Skin: Negative.   Neurological: Negative.   Endo/Heme/Allergies: Negative.   Psychiatric/Behavioral: Negative.    All other systems reviewed and are negative.   Past Medical History:  Diagnosis Date   Abnormal EKG 07/09/2016   Abnormal liver function    Adenomatous colon polyp    Arthritis    Bigeminy    Colonic polyp    Dermatitis    Diabetes mellitus    Displaced fracture of right femoral neck ([place]) 11/24/2018   Diverticulitis    recurrent   DVT (deep venous thrombosis) (HCC)    Esophageal  stricture    GERD (gastroesophageal reflux disease)    Hemorrhoids    Hyperlipidemia    Hypertension    OSA (obstructive sleep apnea) 07/09/2016   last PSG showed no significant OSA   PVC's (premature ventricular contractions)     Past Surgical History:  Procedure Laterality Date   ABDOMINAL HYSTERECTOMY     CHOLECYSTECTOMY     aprox 10 years ago   EYE SURGERY     right and left eye   KNEE SURGERY     Right   KNEE SURGERY     Left   TOTAL HIP ARTHROPLASTY Right 11/24/2018   Procedure: TOTAL HIP ARTHROPLASTY ANTERIOR APPROACH;  Surgeon: Dorna Leitz, MD;  Location: [facility];  Service: Orthopedics;  Laterality: Right;     reports that she has never smoked. She has never used smokeless tobacco. She reports that she does not drink alcohol and does not use drugs.  Allergies  Allergen Reactions   Metoprolol Anaphylaxis   Nortriptyline Anaphylaxis    Other reaction(s): nightmares   Olmesartan Anaphylaxis    Other reaction(s): difficult urination, nausea   Tradjenta [Linagliptin] Shortness Of Breath   Alatrofloxacin Dermatitis   Beta Adrenergic Blockers Other (See Comments)    Reaction: Low Heart Rate   Cardizem [Diltiazem] Itching    Other reaction(s): sick  CANNOT TAKE CARDIZEM, DILTIAZEM OK   Cephalexin Diarrhea and Nausea And Vomiting   Codeine Other (See Comments)    tachycardia Other reaction(s): Palpitations   Colesevelam Hcl Nausea And Vomiting  Other reaction(s): sick to stomach   Crestor [Rosuvastatin Calcium] Other (See Comments)    Leg cramps   Flagyl [Metronidazole] Nausea Only   Fluvastatin Sodium Itching   Iodinated Contrast Media Nausea Only   Levemir [Insulin Detemir] Itching and Other (See Comments)    Bad mood   Lisinopril Nausea Only   Penicillin G Itching    Other reaction(s): itching   Rosuvastatin Other (See Comments)    REACTION: cramps Other reaction(s): leg cramps   Sulfamethoxazole-Trimethoprim Nausea Only and Other (See Comments)     REACTION: nausea, irreg. heartrate   Verapamil Swelling and Other (See Comments)    Chest pain   Adhesive [Tape] Rash    Band Aids   Sitagliptin Rash   Trovan [Trovafloxacin] Rash and Other (See Comments)    Family History  Problem Relation Age of Onset   Heart attack Mother 6   Heart attack Brother 70   Heart attack Maternal Grandmother 67   Heart attack Maternal Grandfather 55   Cancer Daughter        Ovarian Cancer   Diabetes Other        Grandparent   Heart disease Other        Grandparent    Prior to Admission medications   Medication Sig Start Date End Date Taking? Authorizing Provider  acetaminophen (TYLENOL) 500 MG tablet Take 1,000 mg by mouth every 6 (six) hours as needed for moderate pain or headache.   Yes [provider]  alendronate (FOSAMAX) 70 MG tablet Take 70 mg by mouth once a week. 10/31/21  Yes [provider]  ALPRAZolam Duanne Moron) 0.25 MG tablet Take 0.25 mg by mouth daily as needed for anxiety.   Yes [provider]  aspirin 81 MG tablet Take 81 mg by mouth daily.   Yes [provider]  cholecalciferol (VITAMIN D3) 25 MCG (1000 UT) tablet Take 1,000 Units by mouth daily.   Yes [provider]  diltiazem (CARDIZEM CD) 240 MG 24 hr capsule Take 1 capsule (240 mg total) by mouth daily. 01/20/22  Yes Nahser, Wonda Cheng, MD  hydrochlorothiazide (HYDRODIURIL) 25 MG tablet Take 25 mg by mouth daily.   Yes [provider]  LANTUS SOLOSTAR 100 UNIT/ML Solostar Pen Inject 24 Units into the skin daily. 04/01/22  Yes [provider]  NOVOLOG FLEXPEN 100 UNIT/ML FlexPen Inject 38-50 Units into the skin 3 (three) times daily with meals. Take 40 units in the morning, Take 38 units at lunch, Take 50 units in the evening. 03/19/22  Yes [provider]  omeprazole (PRILOSEC) 20 MG capsule Take 20 mg by mouth daily.   Yes [provider]  pravastatin (PRAVACHOL) 40 MG tablet Take 40 mg by mouth at bedtime.   05/28/17  Yes [provider]  ramipril (ALTACE) 10 MG capsule Take 1 capsule by mouth 2 (two) times daily.   Yes [provider]  BD PEN NEEDLE NANO U/F 32G X 4 MM MISC  06/12/19   [provider]    Physical Exam: Vitals:   05/01/22 2215 05/01/22 2230 05/01/22 2244 05/01/22 2328  BP: (!) 125/57 (!) 149/89  (!) 142/68  Pulse: 83 63  89  Resp: '17 15  18  '$ Temp:      TempSrc:      SpO2: 97%   95%  Weight:   94.8 kg   Height:   '5\' 7"'$  (1.702 m)     Physical Exam Vitals and nursing note reviewed.  Constitutional:      General: She is not in acute distress.    Appearance: Normal appearance. She is obese. She is not ill-appearing, toxic-appearing or diaphoretic.  HENT:     Head: Normocephalic and atraumatic.     Nose: Nose normal.  Eyes:     General: No scleral icterus. Cardiovascular:     Rate and Rhythm: Normal rate and regular rhythm.     Pulses: Normal pulses.  Pulmonary:     Effort: Pulmonary effort is normal. No respiratory distress.     Breath sounds: Normal breath sounds. No wheezing or rales.  Abdominal:     General: Bowel sounds are normal. There is no distension.     Palpations: Abdomen is soft.     Tenderness: There is no abdominal tenderness. There is no guarding.  Musculoskeletal:     Right lower leg: No edema.     Left lower leg: No edema.  Skin:    General: Skin is warm and dry.     Capillary Refill: Capillary refill takes less than 2 seconds.  Neurological:     General: No focal deficit present.     Mental Status: She is alert and oriented to person, place, and time.      Labs on Admission: I have personally reviewed following labs and imaging studies  CBC: Recent Labs  Lab 05/01/22 1945  WBC 7.8  NEUTROABS 4.2  HGB 13.0  HCT 38.3  MCV 88.0  PLT 245   Basic Metabolic Panel: Recent Labs  Lab 05/01/22 1945  NA 139  K 3.5  CL 107  CO2 23  GLUCOSE 200*  BUN 17  CREATININE 1.16*  CALCIUM 9.3  MG 1.8    GFR: Estimated Creatinine Clearance: 49.5 mL/min (A) (by C-G formula based on SCr of 1.16 mg/dL (H)). Liver Function Tests: Recent Labs  Lab 05/01/22 1945  AST 30  ALT 31  ALKPHOS 76  BILITOT 0.7  PROT 6.4*  ALBUMIN 3.6   No results for input(s): "LIPASE", "AMYLASE" in the last 168 hours. No results for input(s): "AMMONIA" in the last 168 hours. Coagulation Profile: No results for input(s): "INR", "PROTIME" in the last 168 hours. Cardiac Enzymes: Recent Labs  Lab 05/01/22 1945 05/01/22 2141  TROPONINIHS 43* 323*   BNP (last 3 results) No results for input(s): "PROBNP" in the last 8760 hours. HbA1C: No results for input(s): "HGBA1C" in the last 72 hours. CBG: Recent Labs  Lab 05/01/22 1947  GLUCAP 169*   Lipid Profile: No results for input(s): "CHOL", "HDL", "LDLCALC", "TRIG", "CHOLHDL", "LDLDIRECT" in the last 72 hours. Thyroid Function Tests: No results for input(s): "TSH", "T4TOTAL", "FREET4", "T3FREE", "THYROIDAB" in the last 72 hours. Anemia Panel: No results for input(s): "VITAMINB12", "FOLATE", "FERRITIN", "TIBC", "IRON", "RETICCTPCT" in the last 72 hours. Urine analysis:    Component Value Date/Time   COLORURINE YELLOW 02/20/2014 [place] 02/20/2014 1201   LABSPEC 1.006 02/20/2014 1201   PHURINE 6.0 02/20/2014 1201   GLUCOSEU NEGATIVE 02/20/2014 1201   HGBUR NEGATIVE 02/20/2014 [place] 02/20/2014 1201   KETONESUR NEGATIVE 02/20/2014 1201   PROTEINUR NEGATIVE 02/20/2014 1201   UROBILINOGEN 0.2 02/20/2014 1201   NITRITE NEGATIVE 02/20/2014 1201   LEUKOCYTESUR SMALL (A) 02/20/2014 1201    Radiological Exams on Admission: I have personally reviewed images DG Chest 2 View  Result Date: 05/01/2022 CLINICAL DATA:  Chest pain and shortness of breath. EXAM: CHEST - 2 VIEW COMPARISON:  June 01, 2021 FINDINGS:  The heart size and mediastinal contours are within normal limits. There is moderate severity calcification of  the aortic arch. Both lungs are clear. There is mild, stable elevation of the right hemidiaphragm. Radiopaque surgical clips are seen within the right upper quadrant. Multilevel degenerative changes seen throughout the thoracic spine. IMPRESSION: No active cardiopulmonary disease. Electronically Signed   By: Virgina Norfolk M.D.   On: 05/01/2022 20:22    EKG: My personal interpretation of EKG shows: NSR    Assessment/Plan Principal Problem:   NSTEMI (non-ST elevated myocardial infarction) (HCC) Active Problems:   Type 2 diabetes mellitus with chronic kidney disease, with long-term current use of insulin (HCC)   Mixed hyperlipidemia   GERD   Essential hypertension, benign   OSA (obstructive sleep apnea)   Chronic kidney disease, stage 3a (HCC)    Assessment and Plan: * NSTEMI (non-ST elevated myocardial infarction) ([place]) Admit to cardiac telemetry.  Continue IV heparin.  Cardiology consult pending.  Continue aspirin.  Check lipid level.  Continue statin with pravastatin 40 mg daily.  Check echo.  May need left heart catheterization. NSTEMI (non-ST elevated myocardial infarction) [employer]) is a Acute illness/condition that poses a threat to life or bodily function.   Chronic kidney disease, stage 3a (HCC) Stable.  Baseline creatinine 1.16.  OSA (obstructive sleep apnea) Stable.  Continue CPAP.  Essential hypertension, benign Stable.  Continue HCTZ 25 mg daily., Altace 10 mg twice daily. Hold cardizem. Pt has reported anaphylaxis reaction to betablockers  GERD Stable.  Continue Prilosec 20 mg daily.  Mixed hyperlipidemia Stable.  Continue Pravachol 40 mg daily.  Type 2 diabetes mellitus with chronic kidney disease, with long-term current use of insulin (HCC) Check A1c.  Continue Lantus insulin scale insulin.   DVT prophylaxis: IV heparin gtts Code Status: Full Code Family Communication: discussed with pt and husband Alice Cole at bedside  Disposition Plan: return home  Consults  called: EDP has consulted cardiology. Dr. Chancy Milroy  Admission status: Inpatient, Telemetry bed   Kristopher Oppenheim, DO [facility] 05/01/2022, 11:51 PM

## 2022-05-01 NOTE — Assessment & Plan Note (Signed)
Check A1c.  Continue Lantus insulin scale insulin.

## 2022-05-01 NOTE — Assessment & Plan Note (Addendum)
Stable.  Continue HCTZ 25 mg daily., Altace 10 mg twice daily. Hold cardizem. Pt has reported anaphylaxis reaction to betablockers

## 2022-05-01 NOTE — ED Notes (Signed)
Hospitalitis at bedside

## 2022-05-01 NOTE — Subjective & Objective (Signed)
Chief complaint: Chest pain History present illness: 76 year old Caucasian female history of type 2 diabetes on insulin, CKD stage IIIa, hyperlipidemia, reflux, hypertension, sleep apnea on CPAP, presents to the ER from home with substernal chest pain.  She states that she got into an argument with her 91 year old grandson.  She started having substernal chest pain after he was yelling at her.  EMS was called.  She continued have chest pain until she arrived to ER.  She was given nitroglycerin.  She was given aspirin by EMS.  First troponin 43, second troponin 323.  Patient started on aspirin.  Cardiology consulted.  [facility] hospitalist contacted for admission.

## 2022-05-01 NOTE — Assessment & Plan Note (Signed)
Stable.  -Continue CPAP

## 2022-05-01 NOTE — Assessment & Plan Note (Signed)
Stable.  Baseline creatinine 1.16.

## 2022-05-01 NOTE — Assessment & Plan Note (Signed)
Stable.  Continue Pravachol 40 mg daily.

## 2022-05-01 NOTE — Assessment & Plan Note (Signed)
Stable.  Continue Prilosec 20 mg daily.

## 2022-05-01 NOTE — Progress Notes (Signed)
ANTICOAGULATION CONSULT NOTE - Initial Consult  Pharmacy Consult for heparin Indication: chest pain/ACS  Allergies  Allergen Reactions   Metoprolol Anaphylaxis   Nortriptyline Anaphylaxis    Other reaction(s): nightmares   Olmesartan Anaphylaxis    Other reaction(s): difficult urination, nausea   Tradjenta [Linagliptin] Shortness Of Breath   Alatrofloxacin Dermatitis   Beta Adrenergic Blockers Other (See Comments)    Reaction: Low Heart Rate   Cardizem [Diltiazem] Itching    Other reaction(s): sick  CANNOT TAKE CARDIZEM, DILTIAZEM OK   Cephalexin Diarrhea and Nausea And Vomiting   Codeine Other (See Comments)    tachycardia Other reaction(s): Palpitations   Colesevelam Hcl Nausea And Vomiting    Other reaction(s): sick to stomach   Crestor [Rosuvastatin Calcium] Other (See Comments)    Leg cramps   Flagyl [Metronidazole] Nausea Only   Fluvastatin Sodium Itching   Iodinated Contrast Media Nausea Only   Levemir [Insulin Detemir] Itching and Other (See Comments)    Bad mood   Lisinopril Nausea Only   Penicillin G Itching    Other reaction(s): itching   Rosuvastatin Other (See Comments)    REACTION: cramps Other reaction(s): leg cramps   Sulfamethoxazole-Trimethoprim Nausea Only and Other (See Comments)    REACTION: nausea, irreg. heartrate   Verapamil Swelling and Other (See Comments)    Chest pain   Adhesive [Tape] Rash    Band Aids   Sitagliptin Rash   Trovan [Trovafloxacin] Rash and Other (See Comments)    Patient Measurements: Height: '5\' 7"'$  (170.2 cm) Weight: 94.8 kg (209 lb) IBW/kg (Calculated) : 61.6 Heparin Dosing Weight: 80kg  Vital Signs: Temp: 98.3 F (36.8 C) (07/22 1948) Temp Source: Oral (07/22 1948) BP: 149/89 (07/22 2230) Pulse Rate: 63 (07/22 2230)  Labs: Recent Labs    05/01/22 1945 05/01/22 2141  HGB 13.0  --   HCT 38.3  --   PLT 198  --   CREATININE 1.16*  --   TROPONINIHS 43* 323*    Estimated Creatinine Clearance: 49.5 mL/min  (A) (by C-G formula based on SCr of 1.16 mg/dL (H)).   Medical History: Past Medical History:  Diagnosis Date   Abnormal EKG 07/09/2016   Abnormal liver function    Adenomatous colon polyp    Arthritis    Bigeminy    Colonic polyp    Dermatitis    Diabetes mellitus    Diverticulitis    recurrent   DVT (deep venous thrombosis) (HCC)    Esophageal stricture    GERD (gastroesophageal reflux disease)    Hemorrhoids    Hyperlipidemia    Hypertension    OSA (obstructive sleep apnea) 07/09/2016   last PSG showed no significant OSA   PVC's (premature ventricular contractions)     Assessment: 76yo female c/o CP, troponin elevated and rising >> to begin heparin.  Goal of Therapy:  Heparin level 0.3-0.7 units/ml Monitor platelets by anticoagulation protocol: Yes   Plan:  Heparin 3000 units IV bolus x1 followed by infusion at 1100 units/hr. Monitor heparin levels and CBC.  Wynona Neat, PharmD, BCPS  05/01/2022,11:06 PM

## 2022-05-01 NOTE — ED Triage Notes (Signed)
Pt arrived from home with EMS for centralized CP, onset when her family started arguing. EMS reported PACs and bigeminy when pain was 8/10. Enroute bigeminy resolved and chest pain decreased to 5/10.  Took '324mg'$  ASA; 20g R AC

## 2022-05-01 NOTE — ED Provider Notes (Signed)
[facility] EMERGENCY DEPARTMENT Provider Note   CSN: [id number] Arrival date & time: 05/01/22  1935     History  Chief Complaint  Patient presents with   Chest Pain    Alice Cole is a 76 y.o. female.   Chest Pain  Patient is a 76 year old female with a history of OSA/PVCs, HTN who presents emergency department for evaluation of chest pain.  According to the patient a few hours prior to arrival she was at home and got into an argument with her son. She states that she then had onset of pain in her lower chest which she states was not sharp and she rated as an 8 out of 10 in severity.  She subsequently called EMS in the setting of this pain.  She was not having diaphoresis or nausea or any episodes of vomiting.  She denies feeling short of breath.  EMS gave her 1 full dose aspirin prior to arrival.  She had bigeminy in route that has since resolved.  Since her arrival here to the emergency department she states that her chest pain has subsided.  She also currently denies any shortness of breath, nausea, vomiting.  She denies any urinary symptoms or recent upper respiratory infection symptoms.  She denies any lower leg swelling.  Per old records patient had a nuclear stress test on 04/01/2021 which showed EF 76% with no ST segment deviation noted during stress and no T wave inversion noted.  Study was low risk and normal.  The indication to receive this test was her strong family history of underlying cardiac issues.  Patient recently was wearing a long-term monitor on 02/12/2022 in the setting low heart rate which was causing her symptoms.  The study showed intermittent complete heart block occurring during sleep at 49 beats per minutes lasting up to 7.9 seconds in duration with ventricular escape rhythms.  Patient is ultimately going to be set up for EP evaluation for pacemaker placement.  In the setting of her history of PVCs she had been started on the 360 mg Cardizem  daily also with a secondary effect of helping to control her high blood pressure. In the setting of recent episodes of bradycardia over the past week, her Cardizem has been decreased to '240mg'$  qd.       Home Medications Prior to Admission medications   Medication Sig Start Date End Date Taking? Authorizing Provider  acetaminophen (TYLENOL) 500 MG tablet Take 1,000 mg by mouth every 6 (six) hours as needed for moderate pain or headache.   Yes [provider]  alendronate (FOSAMAX) 70 MG tablet Take 70 mg by mouth once a week. 10/31/21  Yes [provider]  ALPRAZolam Duanne Moron) 0.25 MG tablet Take 0.25 mg by mouth daily as needed for anxiety.   Yes [provider]  aspirin 81 MG tablet Take 81 mg by mouth daily.   Yes [provider]  cholecalciferol (VITAMIN D3) 25 MCG (1000 UT) tablet Take 1,000 Units by mouth daily.   Yes [provider]  diltiazem (CARDIZEM CD) 240 MG 24 hr capsule Take 1 capsule (240 mg total) by mouth daily. 01/20/22  Yes Nahser, Wonda Cheng, MD  hydrochlorothiazide (HYDRODIURIL) 25 MG tablet Take 25 mg by mouth daily.   Yes [provider]  LANTUS SOLOSTAR 100 UNIT/ML Solostar Pen Inject 24 Units into the skin daily. 04/01/22  Yes [provider]  NOVOLOG FLEXPEN 100 UNIT/ML FlexPen Inject 38-50 Units into the skin 3 (three)  times daily with meals. Take 40 units in the morning, Take 38 units at lunch, Take 50 units in the evening. 03/19/22  Yes [provider]  omeprazole (PRILOSEC) 20 MG capsule Take 20 mg by mouth daily.   Yes [provider]  pravastatin (PRAVACHOL) 40 MG tablet Take 40 mg by mouth at bedtime.  05/28/17  Yes [provider]  ramipril (ALTACE) 10 MG capsule Take 1 capsule by mouth 2 (two) times daily.   Yes [provider]  BD PEN NEEDLE NANO U/F 32G X 4 MM MISC  06/12/19   [provider]      Allergies    Metoprolol, Nortriptyline, Olmesartan, Tradjenta  [linagliptin], Alatrofloxacin, Beta adrenergic blockers, Cardizem [diltiazem], Cephalexin, Codeine, Colesevelam hcl, Crestor [rosuvastatin calcium], Flagyl [metronidazole], Fluvastatin sodium, Iodinated contrast media, Levemir [insulin detemir], Lisinopril, Penicillin g, Rosuvastatin, Sulfamethoxazole-trimethoprim, Verapamil, Adhesive [tape], Sitagliptin, and Trovan [trovafloxacin]    Review of Systems   Review of Systems  Cardiovascular:  Positive for chest pain.    Physical Exam Updated Vital Signs BP (!) 142/68   Pulse 89   Temp 98.3 F (36.8 C) (Oral)   Resp 18   Ht '5\' 7"'$  (1.702 m)   Wt 94.8 kg   SpO2 95%   BMI 32.73 kg/m  Physical Exam Vitals and nursing note reviewed.  Constitutional:      General: She is not in acute distress.    Appearance: She is well-developed. She is obese. She is not toxic-appearing or diaphoretic.  HENT:     Head: Atraumatic.  Eyes:     Extraocular Movements: Extraocular movements intact.     Conjunctiva/sclera: Conjunctivae normal.     Pupils: Pupils are equal, round, and reactive to light.  Neck:     Vascular: No JVD.  Cardiovascular:     Rate and Rhythm: Regular rhythm. Bradycardia present.     Pulses:          Carotid pulses are 2+ on the right side and 2+ on the left side.      Radial pulses are 2+ on the right side and 2+ on the left side.       Dorsalis pedis pulses are 2+ on the right side and 2+ on the left side.       Posterior tibial pulses are 2+ on the right side and 2+ on the left side.     Heart sounds: No murmur heard. Pulmonary:     Effort: Pulmonary effort is normal. No tachypnea or respiratory distress.     Breath sounds: Normal breath sounds. No decreased breath sounds, wheezing, rhonchi or rales.  Abdominal:     Palpations: Abdomen is soft.     Tenderness: There is no abdominal tenderness. There is no guarding or rebound.  Musculoskeletal:        General: No swelling.     Cervical back: Neck supple.     Right lower  leg: No tenderness. No edema.     Left lower leg: No tenderness. No edema.  Skin:    General: Skin is warm and dry.     Capillary Refill: Capillary refill takes less than 2 seconds.  Neurological:     Mental Status: She is alert.  Psychiatric:        Mood and Affect: Mood normal.     ED Results / Procedures / Treatments   Labs (all labs ordered are listed, but only abnormal results are displayed) Labs Reviewed  COMPREHENSIVE METABOLIC PANEL - Abnormal; Notable  for the following components:      Result Value   Glucose, Bld 200 (*)    Creatinine, Ser 1.16 (*)    Total Protein 6.4 (*)    GFR, Estimated 49 (*)    All other components within normal limits  CBG MONITORING, ED - Abnormal; Notable for the following components:   Glucose-Capillary 169 (*)    All other components within normal limits  TROPONIN I (HIGH SENSITIVITY) - Abnormal; Notable for the following components:   Troponin I (High Sensitivity) 43 (*)    All other components within normal limits  TROPONIN I (HIGH SENSITIVITY) - Abnormal; Notable for the following components:   Troponin I (High Sensitivity) 323 (*)    All other components within normal limits  CBC WITH DIFFERENTIAL/PLATELET  MAGNESIUM  HEPARIN LEVEL (UNFRACTIONATED)  CBC    EKG EKG Interpretation  Date/Time:  Saturday May 01 2022 20:03:30 EDT Ventricular Rate:  99 PR Interval:  183 QRS Duration: 92 QT Interval:  295 QTC Calculation: 379 R Axis:   -56 Text Interpretation: Sinus tachycardia Consider left atrial enlargement Inferior infarct, old Anterior infarct, old No significant change since last tracing Confirmed by Wandra Arthurs [phone]) on 05/01/2022 8:14:51 PM  Radiology DG Chest 2 View  Result Date: 05/01/2022 CLINICAL DATA:  Chest pain and shortness of breath. EXAM: CHEST - 2 VIEW COMPARISON:  June 01, 2021 FINDINGS: The heart size and mediastinal contours are within normal limits. There is moderate severity calcification of the aortic  arch. Both lungs are clear. There is mild, stable elevation of the right hemidiaphragm. Radiopaque surgical clips are seen within the right upper quadrant. Multilevel degenerative changes seen throughout the thoracic spine. IMPRESSION: No active cardiopulmonary disease. Electronically Signed   By: Virgina Norfolk M.D.   On: 05/01/2022 20:22    Procedures Procedures    Medications Ordered in ED Medications  heparin ADULT infusion 100 units/mL (25000 units/231m) (1,100 Units/hr Intravenous New Bag/Given 05/01/22 2323)  heparin bolus via infusion 3,000 Units (3,000 Units Intravenous Bolus from Bag 05/01/22 2324)    ED Course/ Medical Decision Making/ A&P                           Medical Decision Making Problems Addressed: AKI (acute kidney injury) ([facility]: complicated acute illness or injury NSTEMI (non-ST elevated myocardial infarction) ([facility] complicated acute illness or injury with systemic symptoms that poses a threat to life or bodily functions  Amount and/or Complexity of Data Reviewed Independent Historian: EMS External Data Reviewed: labs, radiology, ECG and notes. Labs: ordered. Radiology: ordered. ECG/medicine tests: ordered and independent interpretation performed.    Details: Rate 99. Sinus tachycardia. QTc 379.  No acute ST or T wave abnormalities to suggest underlying ACS.  Risk Prescription drug management.   Patient is a 76year old female emergency department as above.  On initial presentation patient is slightly bradycardic at 49 but she is afebrile and saturating appropriately on room air. Physical exam did not reveal any acute pain at this time.  In the setting of the patient's underlying cardiac history baseline laboratory work was ordered as well as EKG and chest x-ray.  Initial EKG read as above.  CMP with slight elevation in creatinine at 1.16 with baseline 1.8.  Initial troponin 43.  CBC without evidence of leukocytosis or anemia.  Chest x-ray without acute  cardiopulmonary abnormalities.  Patient remained chest free throughout her stay in the emergency department, however her second troponin  resulted at 323.  For this reason patient was started on a heparin drip and cardiologist was consulted.  Their recommendations were for patient admission and agreed with the initiation of the heparin drip.  They plan to perform an echocardiogram tomorrow and subsequent cath should there be any wall motion abnormalities or further significant increases in troponin.  Hospitalist was also consulted for patient admission and they agreed to admit the patient.  Plan and findings discussed with patient at bedside and she verbalized understanding was agreeable.  Please see inpatient provider notes for additional details regarding this patient's hospital course and management.        Final Clinical Impression(s) / ED Diagnoses Final diagnoses:  NSTEMI (non-ST elevated myocardial infarction) ([place])  AKI (acute kidney injury) [facility])    Rx / DC Orders ED Discharge Orders     None         Nelta Numbers, MD 05/01/22 2331    Drenda Freeze, MD 05/02/22 [phone]

## 2022-05-02 ENCOUNTER — Inpatient Hospital Stay (HOSPITAL_COMMUNITY): Payer: HMO

## 2022-05-02 DIAGNOSIS — R079 Chest pain, unspecified: Secondary | ICD-10-CM | POA: Diagnosis not present

## 2022-05-02 DIAGNOSIS — I214 Non-ST elevation (NSTEMI) myocardial infarction: Secondary | ICD-10-CM | POA: Diagnosis not present

## 2022-05-02 LAB — CBC
HCT: 40.2 % (ref 36.0–46.0)
Hemoglobin: 13.3 g/dL (ref 12.0–15.0)
MCH: 29.3 pg (ref 26.0–34.0)
MCHC: 33.1 g/dL (ref 30.0–36.0)
MCV: 88.5 fL (ref 80.0–100.0)
Platelets: 225 10*3/uL (ref 150–400)
RBC: 4.54 MIL/uL (ref 3.87–5.11)
RDW: 12.2 % (ref 11.5–15.5)
WBC: 8 10*3/uL (ref 4.0–10.5)
nRBC: 0 % (ref 0.0–0.2)

## 2022-05-02 LAB — ECHOCARDIOGRAM COMPLETE
Area-P 1/2: 2.64 cm2
Height: 67 in
S' Lateral: 2.3 cm
Weight: 3344 oz

## 2022-05-02 LAB — GLUCOSE, CAPILLARY
Glucose-Capillary: 303 mg/dL — ABNORMAL HIGH (ref 70–99)
Glucose-Capillary: 332 mg/dL — ABNORMAL HIGH (ref 70–99)

## 2022-05-02 LAB — LIPID PANEL
Cholesterol: 166 mg/dL (ref 0–200)
HDL: 37 mg/dL — ABNORMAL LOW (ref >40)
LDL Cholesterol: 92 mg/dL (ref 0–99)
Total CHOL/HDL Ratio: 4.5 RATIO
Triglycerides: 185 mg/dL — ABNORMAL HIGH (ref <150)
VLDL: 37 mg/dL (ref 0–40)

## 2022-05-02 LAB — CBG MONITORING, ED
Glucose-Capillary: 193 mg/dL — ABNORMAL HIGH (ref 70–99)
Glucose-Capillary: 230 mg/dL — ABNORMAL HIGH (ref 70–99)

## 2022-05-02 LAB — HEMOGLOBIN A1C
Hgb A1c MFr Bld: 7.4 % — ABNORMAL HIGH (ref 4.8–5.6)
Mean Plasma Glucose: 165.68 mg/dL

## 2022-05-02 LAB — HEPARIN LEVEL (UNFRACTIONATED): Heparin Unfractionated: 0.4 IU/mL (ref 0.30–0.70)

## 2022-05-02 LAB — TROPONIN I (HIGH SENSITIVITY)
Troponin I (High Sensitivity): 565 ng/L (ref <18)
Troponin I (High Sensitivity): 482 ng/L (ref <18)

## 2022-05-02 MED ORDER — NITROGLYCERIN 0.4 MG SL SUBL
0.4000 mg | SUBLINGUAL_TABLET | SUBLINGUAL | Status: DC | PRN
  Filled 2022-05-02: qty 1

## 2022-05-02 MED ORDER — PRAVASTATIN SODIUM 40 MG PO TABS
40.0000 mg | ORAL_TABLET | Freq: Every day | ORAL | Status: DC
  Administered 2022-05-02 – 2022-05-03 (×2): 40 mg via ORAL
  Filled 2022-05-02 (×2): qty 1

## 2022-05-02 MED ORDER — INSULIN GLARGINE-YFGN 100 UNIT/ML ~~LOC~~ SOLN: 25.0000 [IU] | Freq: Once | SUBCUTANEOUS | Status: DC

## 2022-05-02 MED ORDER — PREDNISONE 20 MG PO TABS
50.0000 mg | ORAL_TABLET | Freq: Four times a day (QID) | ORAL | Status: DC
  Administered 2022-05-02: 50 mg via ORAL
  Filled 2022-05-02: qty 2

## 2022-05-02 MED ORDER — INSULIN GLARGINE-YFGN 100 UNIT/ML ~~LOC~~ SOLN
12.0000 [IU] | Freq: Every day | SUBCUTANEOUS | Status: AC
  Administered 2022-05-03: 12 [IU] via SUBCUTANEOUS
  Filled 2022-05-02: qty 0.12

## 2022-05-02 MED ORDER — INSULIN GLARGINE-YFGN 100 UNIT/ML ~~LOC~~ SOLN
24.0000 [IU] | Freq: Every day | SUBCUTANEOUS | Status: DC
  Administered 2022-05-04: 24 [IU] via SUBCUTANEOUS
  Filled 2022-05-02: qty 0.24

## 2022-05-02 MED ORDER — PREDNISONE 20 MG PO TABS: 50.0000 mg | ORAL_TABLET | Freq: Four times a day (QID) | ORAL | Status: DC

## 2022-05-02 MED ORDER — INSULIN GLARGINE-YFGN 100 UNIT/ML ~~LOC~~ SOLN
25.0000 [IU] | Freq: Every day | SUBCUTANEOUS | Status: DC
  Filled 2022-05-02: qty 0.25

## 2022-05-02 MED ORDER — INSULIN GLARGINE-YFGN 100 UNIT/ML ~~LOC~~ SOLN: 25.0000 [IU] | Freq: Every day | SUBCUTANEOUS | Status: DC

## 2022-05-02 MED ORDER — SODIUM CHLORIDE 0.9 % IV SOLN: 250.0000 mL | INTRAVENOUS | Status: DC | PRN

## 2022-05-02 MED ORDER — MELATONIN 5 MG PO TABS: 10.0000 mg | ORAL_TABLET | Freq: Every evening | ORAL | Status: DC | PRN

## 2022-05-02 MED ORDER — DIPHENHYDRAMINE HCL 25 MG PO CAPS: 50.0000 mg | ORAL_CAPSULE | Freq: Once | ORAL | Status: DC

## 2022-05-02 MED ORDER — RAMIPRIL 5 MG PO CAPS
10.0000 mg | ORAL_CAPSULE | Freq: Two times a day (BID) | ORAL | Status: DC
  Administered 2022-05-02 (×2): 10 mg via ORAL
  Filled 2022-05-02 (×3): qty 2

## 2022-05-02 MED ORDER — ASPIRIN 81 MG PO CHEW
81.0000 mg | CHEWABLE_TABLET | ORAL | Status: AC
  Administered 2022-05-03: 81 mg via ORAL
  Filled 2022-05-02: qty 1

## 2022-05-02 MED ORDER — INSULIN ASPART 100 UNIT/ML IJ SOLN
0.0000 [IU] | Freq: Every day | INTRAMUSCULAR | Status: DC
  Administered 2022-05-02: 4 [IU] via SUBCUTANEOUS
  Administered 2022-05-03: 2 [IU] via SUBCUTANEOUS

## 2022-05-02 MED ORDER — ALPRAZOLAM 0.25 MG PO TABS
0.2500 mg | ORAL_TABLET | Freq: Every day | ORAL | Status: DC | PRN
Start: 2022-05-02 — End: 2022-05-04

## 2022-05-02 MED ORDER — SODIUM CHLORIDE 0.9% FLUSH: 3.0000 mL | INTRAVENOUS | Status: DC | PRN

## 2022-05-02 MED ORDER — ASPIRIN 81 MG PO TBEC: 81.0000 mg | DELAYED_RELEASE_TABLET | Freq: Every day | ORAL | Status: DC

## 2022-05-02 MED ORDER — INSULIN ASPART 100 UNIT/ML IJ SOLN
0.0000 [IU] | Freq: Three times a day (TID) | INTRAMUSCULAR | Status: DC
  Administered 2022-05-02: 11 [IU] via SUBCUTANEOUS
  Administered 2022-05-02: 5 [IU] via SUBCUTANEOUS
  Administered 2022-05-02: 3 [IU] via SUBCUTANEOUS

## 2022-05-02 MED ORDER — ACETAMINOPHEN 325 MG PO TABS
650.0000 mg | ORAL_TABLET | ORAL | Status: DC | PRN
  Administered 2022-05-02: 650 mg via ORAL
  Filled 2022-05-02: qty 2

## 2022-05-02 MED ORDER — PANTOPRAZOLE SODIUM 40 MG PO TBEC
40.0000 mg | DELAYED_RELEASE_TABLET | Freq: Every day | ORAL | Status: DC
  Administered 2022-05-02 – 2022-05-04 (×3): 40 mg via ORAL
  Filled 2022-05-02 (×3): qty 1

## 2022-05-02 MED ORDER — DIPHENHYDRAMINE HCL 50 MG/ML IJ SOLN: 50.0000 mg | Freq: Once | INTRAMUSCULAR | Status: DC

## 2022-05-02 MED ORDER — INSULIN GLARGINE-YFGN 100 UNIT/ML ~~LOC~~ SOLN: 12.0000 [IU] | Freq: Every day | SUBCUTANEOUS | Status: DC

## 2022-05-02 MED ORDER — ASPIRIN 81 MG PO TBEC
81.0000 mg | DELAYED_RELEASE_TABLET | Freq: Every day | ORAL | Status: DC
  Administered 2022-05-02: 81 mg via ORAL
  Filled 2022-05-02: qty 1

## 2022-05-02 MED ORDER — ONDANSETRON HCL 4 MG/2ML IJ SOLN: 4.0000 mg | Freq: Four times a day (QID) | INTRAMUSCULAR | Status: DC | PRN

## 2022-05-02 MED ORDER — INSULIN GLARGINE-YFGN 100 UNIT/ML ~~LOC~~ SOLN
24.0000 [IU] | Freq: Once | SUBCUTANEOUS | Status: AC
  Administered 2022-05-02: 24 [IU] via SUBCUTANEOUS
  Filled 2022-05-02: qty 0.24

## 2022-05-02 MED ORDER — SODIUM CHLORIDE 0.9% FLUSH
3.0000 mL | Freq: Two times a day (BID) | INTRAVENOUS | Status: DC
Start: 2022-05-02 — End: 2022-05-04

## 2022-05-02 MED ORDER — ASPIRIN 81 MG PO TBEC
81.0000 mg | DELAYED_RELEASE_TABLET | Freq: Every day | ORAL | Status: DC
  Administered 2022-05-04: 81 mg via ORAL
  Filled 2022-05-02: qty 1

## 2022-05-02 MED ORDER — HYDROCHLOROTHIAZIDE 25 MG PO TABS
25.0000 mg | ORAL_TABLET | Freq: Every day | ORAL | Status: DC
  Administered 2022-05-02: 25 mg via ORAL
  Filled 2022-05-02: qty 1

## 2022-05-02 MED ORDER — SODIUM CHLORIDE 0.9 % WEIGHT BASED INFUSION
1.0000 mL/kg/h | INTRAVENOUS | Status: DC
  Administered 2022-05-03: 1 mL/kg/h via INTRAVENOUS

## 2022-05-02 MED ORDER — SODIUM CHLORIDE 0.9 % WEIGHT BASED INFUSION
3.0000 mL/kg/h | INTRAVENOUS | Status: DC
Start: 2022-05-03 — End: 2022-05-03
  Administered 2022-05-03: 3 mL/kg/h via INTRAVENOUS

## 2022-05-02 NOTE — Progress Notes (Signed)
PROGRESS NOTE    Alice Cole  OIN:[id number] DOB: Aug 19, 1946 DOA: 05/01/2022 PCP: [facility]   Brief Narrative:    76 year old Caucasian female history of type 2 diabetes on insulin, CKD stage IIIa, hyperlipidemia, reflux, hypertension, sleep apnea on CPAP, presents to the ER from home with substernal chest pain.  She has been admitted with NSTEMI and has been started on heparin drip.  Cardiology anticipating possible LHC 7/24.  2D echocardiogram pending.  Assessment & Plan:   Principal Problem:   NSTEMI (non-ST elevated myocardial infarction) ([place]) Active Problems:   Type 2 diabetes mellitus with chronic kidney disease, with long-term current use of insulin (HCC)   Mixed hyperlipidemia   GERD   Essential hypertension, benign   OSA (obstructive sleep apnea)   Chronic kidney disease, stage 3a (HCC)  Assessment and Plan:   NSTEMI (non-ST elevated myocardial infarction) ([place]) Continue IV heparin Appreciate ongoing cardiology consultation with echocardiogram pending Troponin trend increasing Likely will require LHC on Monday, will keep n.p.o. after midnight Continue aspirin and statin Avoid beta-blocker and Cardizem     Chronic kidney disease, stage 3a ([place]) Stable.  Baseline creatinine 1.16. Recheck labs in a.m.   OSA (obstructive sleep apnea) Stable.  Continue CPAP.   Essential hypertension, benign Stable.  Continue HCTZ 25 mg daily., Altace 10 mg twice daily. Hold cardizem. Pt has reported anaphylaxis reaction to betablockers   GERD Stable.  Continue Prilosec 20 mg daily.   Mixed hyperlipidemia Stable.  Continue Pravachol 40 mg daily.   Type 2 diabetes mellitus with chronic kidney disease, with long-term current use of insulin (HCC) Check A1c.  Continue Lantus insulin scale insulin.  Obesity BMI 32.73 Lifestyle changes outpatient    DVT prophylaxis: IV heparin drip Code Status: Full Family Communication: None at bedside Disposition  Plan:  Status is: Inpatient Remains inpatient appropriate because: Need for IV medications.  Consultants:  Cardiology  Procedures:  None  Antimicrobials:  None   Subjective: Patient seen and evaluated today with no new acute complaints or concerns.  She denies any further chest pain this morning.  No acute concerns or events noted overnight.  Objective: Vitals:   05/02/22 0600 05/02/22 0645 05/02/22 0715 05/02/22 0800  BP: (!) 132/94   (!) 162/89  Pulse: 83 87 83 92  Resp: 15 20 (!) 21 (!) 22  Temp: 98 F (36.7 C)     TempSrc: Oral     SpO2: 98% 95% 95% 95%  Weight:      Height:       No intake or output data in the 24 hours ending 05/02/22 0943 Filed Weights   05/01/22 2244  Weight: 94.8 kg    Examination:  General exam: Appears calm and comfortable, obese Respiratory system: Clear to auscultation. Respiratory effort normal. Cardiovascular system: S1 & S2 heard, RRR.  Gastrointestinal system: Abdomen is soft Central nervous system: Alert and awake Extremities: No edema Skin: No significant lesions noted Psychiatry: Flat affect.    Data Reviewed: I have personally reviewed following labs and imaging studies  CBC: Recent Labs  Lab 05/01/22 1945 05/02/22 0351  WBC 7.8 8.0  NEUTROABS 4.2  --   HGB 13.0 13.3  HCT 38.3 40.2  MCV 88.0 88.5  PLT 198 709   Basic Metabolic Panel: Recent Labs  Lab 05/01/22 1945  NA 139  K 3.5  CL 107  CO2 23  GLUCOSE 200*  BUN 17  CREATININE 1.16*  CALCIUM 9.3  MG 1.8  GFR: Estimated Creatinine Clearance: 49.5 mL/min (A) (by C-G formula based on SCr of 1.16 mg/dL (H)). Liver Function Tests: Recent Labs  Lab 05/01/22 1945  AST 30  ALT 31  ALKPHOS 76  BILITOT 0.7  PROT 6.4*  ALBUMIN 3.6   No results for input(s): "LIPASE", "AMYLASE" in the last 168 hours. No results for input(s): "AMMONIA" in the last 168 hours. Coagulation Profile: No results for input(s): "INR", "PROTIME" in the last 168  hours. Cardiac Enzymes: No results for input(s): "CKTOTAL", "CKMB", "CKMBINDEX", "TROPONINI" in the last 168 hours. BNP (last 3 results) No results for input(s): "PROBNP" in the last 8760 hours. HbA1C: Recent Labs    05/02/22 0351  HGBA1C 7.4*   CBG: Recent Labs  Lab 05/01/22 1947 05/02/22 0734  GLUCAP 169* 193*   Lipid Profile: Recent Labs    05/02/22 0351  CHOL 166  HDL 37*  LDLCALC 92  TRIG 185*  CHOLHDL 4.5   Thyroid Function Tests: No results for input(s): "TSH", "T4TOTAL", "FREET4", "T3FREE", "THYROIDAB" in the last 72 hours. Anemia Panel: No results for input(s): "VITAMINB12", "FOLATE", "FERRITIN", "TIBC", "IRON", "RETICCTPCT" in the last 72 hours. Sepsis Labs: No results for input(s): "PROCALCITON", "LATICACIDVEN" in the last 168 hours.  No results found for this or any previous visit (from the past 240 hour(s)).       Radiology Studies: DG Chest 2 View  Result Date: 05/01/2022 CLINICAL DATA:  Chest pain and shortness of breath. EXAM: CHEST - 2 VIEW COMPARISON:  June 01, 2021 FINDINGS: The heart size and mediastinal contours are within normal limits. There is moderate severity calcification of the aortic arch. Both lungs are clear. There is mild, stable elevation of the right hemidiaphragm. Radiopaque surgical clips are seen within the right upper quadrant. Multilevel degenerative changes seen throughout the thoracic spine. IMPRESSION: No active cardiopulmonary disease. Electronically Signed   By: Virgina Norfolk M.D.   On: 05/01/2022 20:22        Scheduled Meds:  aspirin EC  81 mg Oral Daily   hydrochlorothiazide  25 mg Oral Daily   insulin aspart  0-15 Units Subcutaneous TID WC   insulin aspart  0-5 Units Subcutaneous QHS   insulin glargine-yfgn  25 Units Subcutaneous Daily   pantoprazole  40 mg Oral Daily   pravastatin  40 mg Oral QHS   ramipril  10 mg Oral BID   Continuous Infusions:  heparin 1,100 Units/hr (05/02/22 0738)     LOS: 1 day     Time spent: 35 minutes    Jahfari Ambers Darleen Crocker, DO [facility]  If 7PM-7AM, please contact night-coverage [email or web address] 05/02/2022, 9:43 AM

## 2022-05-02 NOTE — Progress Notes (Signed)
[place] for heparin Indication: chest pain/ACS  Allergies  Allergen Reactions   Metoprolol Anaphylaxis   Nortriptyline Anaphylaxis    Other reaction(s): nightmares   Olmesartan Anaphylaxis    Other reaction(s): difficult urination, nausea   Tradjenta [Linagliptin] Shortness Of Breath   Alatrofloxacin Dermatitis   Beta Adrenergic Blockers Other (See Comments)    Reaction: Low Heart Rate   Cardizem [Diltiazem] Itching    Other reaction(s): sick  CANNOT TAKE CARDIZEM, DILTIAZEM OK   Cephalexin Diarrhea and Nausea And Vomiting   Codeine Other (See Comments)    tachycardia Other reaction(s): Palpitations   Colesevelam Hcl Nausea And Vomiting    Other reaction(s): sick to stomach   Crestor [Rosuvastatin Calcium] Other (See Comments)    Leg cramps   Flagyl [Metronidazole] Nausea Only   Fluvastatin Sodium Itching   Iodinated Contrast Media Nausea Only   Levemir [Insulin Detemir] Itching and Other (See Comments)    Bad mood   Lisinopril Nausea Only   Penicillin G Itching    Other reaction(s): itching   Rosuvastatin Other (See Comments)    REACTION: cramps Other reaction(s): leg cramps   Sulfamethoxazole-Trimethoprim Nausea Only and Other (See Comments)    REACTION: nausea, irreg. heartrate   Verapamil Swelling and Other (See Comments)    Chest pain   Adhesive [Tape] Rash    Band Aids   Sitagliptin Rash   Trovan [Trovafloxacin] Rash and Other (See Comments)    Patient Measurements: Height: '5\' 7"'$  (170.2 cm) Weight: 94.8 kg (209 lb) IBW/kg (Calculated) : 61.6 Heparin Dosing Weight: 80kg  Vital Signs: Temp: 98 F (36.7 C) (07/23 0600) Temp Source: Oral (07/23 0600) BP: 132/94 (07/23 0600) Pulse Rate: 83 (07/23 0715)  Labs: Recent Labs    05/01/22 1945 05/01/22 2141 05/02/22 0351 05/02/22 0732  HGB 13.0  --  13.3  --   HCT 38.3  --  40.2  --   PLT 198  --  225  --   HEPARINUNFRC  --   --   --  0.40  CREATININE 1.16*  --    --   --   TROPONINIHS 43* 323*  --   --      Estimated Creatinine Clearance: 49.5 mL/min (A) (by C-G formula based on SCr of 1.16 mg/dL (H)).   Assessment: 76yo female c/o CP, troponin elevated and rising >> to begin heparin.  Heparin level therapeutic   Goal of Therapy:  Heparin level 0.3-0.7 units/ml Monitor platelets by anticoagulation protocol: Yes   Plan:  Continue heparin at 11000 units / hr Monitor heparin levels and CBC.  Thank you Anette Guarneri, PharmD 05/02/2022,8:01 AM

## 2022-05-02 NOTE — Plan of Care (Signed)
Personally seen and examined. Agree with overnight fellow above with the following comments:  Briefly 76 yo F with  Ht with DM, CKD Stage Iia, Hld with DM with chest pain and NSTEMI  Patient notes no CP now Notes last LHC was 19 years ago Doesn't remember contrast allergy but notes that she has many allergies to medications  Exam notable for RRR largely benign Echo WNL CT Notable LAD and LCX CAC, Aortic atherosclerosis  Labs notable for troponin 46-> 300s-> 500s- pending? EKG SR 91 inferior infarct pattern Tele: SR with PVcs - will plans for LHC; consented and orders are in - agree with heparin - Contrast allergy is unclear but will pre-treat.  Rudean Haskell, MD [place], [address], [place] [address] [phone]  10:51 AM

## 2022-05-02 NOTE — Progress Notes (Signed)
   05/02/22 1915  Clinical Encounter Type  Visited With Health care provider  Visit Type Initial  Referral From Nurse  Consult/Referral To Chaplain   Chaplain attempted to see patient today to follow-up on spiritual consult for AD. Unable to see patient due to medical staff attending to patient at the time. Patient may be discharged on Monday, 05/02/2022 per discussion between patient and medical team. Follow-up required if possible.   Melody Haver, Resident Chaplain [phone]

## 2022-05-02 NOTE — ED Notes (Signed)
Back from echo, moved from orange 16 to green 6. Pt alert, NAD, calm, interactive, no changes.

## 2022-05-02 NOTE — Consult Note (Signed)
Cardiology Consultation:   Patient ID: Alice Cole MRN: [id number]; DOB: [birth year]  Admit date: 05/01/2022 Date of Consult: 05/02/2022  PCP:  [facility], [facility] Providers Cardiologist:  Fransico Him, MD        Patient Profile:   Alice Cole is a 76 y.o. female with a hx of type 2 diabetes on insulin, CKD stage IIIa, hyperlipidemia, reflux, hypertension, sleep apnea on CPAP, who is being seen 05/02/2022 for the evaluation of chest pain at the request of Dr. Bridgett Larsson  History of Present Illness:   Alice Cole is a 76 y.o. female with a hx of type 2 diabetes on insulin, CKD stage IIIa, hyperlipidemia, reflux, hypertension, sleep apnea on CPAP, who is being seen 05/02/2022 for the evaluation of chest pain at the request of Dr. Bridgett Larsson. She has had PVCs and incomplete heart block at night for some time for which she has seen EP. She has also had chronic DOE felt secondary to obesity, poorly controlled hypertension, deconditioning and DD. Today she comes in to the ED because of mid sternal chest pain when she was arguing with her grandson. EMS was called who gave her NTG and aspirin. In the ED, her CP resolved and felt better.   First troponin 43, second troponin 323. Other labs largely unremarkable. VS stable; she almost feels back to baseline. Cardiology was consulted. She had a stress test in 2022 which was negative.    Past Medical History:  Diagnosis Date   Abnormal EKG 07/09/2016   Abnormal liver function    Adenomatous colon polyp    Arthritis    Bigeminy    Colonic polyp    Dermatitis    Diabetes mellitus    Displaced fracture of right femoral neck ([place]) 11/24/2018   Diverticulitis    recurrent   DVT (deep venous thrombosis) (HCC)    Esophageal stricture    GERD (gastroesophageal reflux disease)    Hemorrhoids    Hyperlipidemia    Hypertension    OSA (obstructive sleep apnea) 07/09/2016   last PSG showed no significant OSA   PVC's (premature ventricular  contractions)     Past Surgical History:  Procedure Laterality Date   ABDOMINAL HYSTERECTOMY     CHOLECYSTECTOMY     aprox 10 years ago   EYE SURGERY     right and left eye   KNEE SURGERY     Right   KNEE SURGERY     Left   TOTAL HIP ARTHROPLASTY Right 11/24/2018   Procedure: TOTAL HIP ARTHROPLASTY ANTERIOR APPROACH;  Surgeon: Dorna Leitz, MD;  Location: [facility];  Service: Orthopedics;  Laterality: Right;       Inpatient Medications: Scheduled Meds:  aspirin EC  81 mg Oral Daily   hydrochlorothiazide  25 mg Oral Daily   insulin aspart  0-15 Units Subcutaneous TID WC   insulin aspart  0-5 Units Subcutaneous QHS   insulin glargine-yfgn  25 Units Subcutaneous Daily   pantoprazole  40 mg Oral Daily   pravastatin  40 mg Oral QHS   ramipril  10 mg Oral BID   Continuous Infusions:  heparin 1,100 Units/hr (05/01/22 2323)   PRN Meds: acetaminophen, ALPRAZolam, melatonin, nitroGLYCERIN, ondansetron (ZOFRAN) IV  Allergies:    Allergies  Allergen Reactions   Metoprolol Anaphylaxis   Nortriptyline Anaphylaxis    Other reaction(s): nightmares   Olmesartan Anaphylaxis    Other reaction(s): difficult urination, nausea   Tradjenta [Linagliptin] Shortness Of Breath  Alatrofloxacin Dermatitis   Beta Adrenergic Blockers Other (See Comments)    Reaction: Low Heart Rate   Cardizem [Diltiazem] Itching    Other reaction(s): sick  CANNOT TAKE CARDIZEM, DILTIAZEM OK   Cephalexin Diarrhea and Nausea And Vomiting   Codeine Other (See Comments)    tachycardia Other reaction(s): Palpitations   Colesevelam Hcl Nausea And Vomiting    Other reaction(s): sick to stomach   Crestor [Rosuvastatin Calcium] Other (See Comments)    Leg cramps   Flagyl [Metronidazole] Nausea Only   Fluvastatin Sodium Itching   Iodinated Contrast Media Nausea Only   Levemir [Insulin Detemir] Itching and Other (See Comments)    Bad mood   Lisinopril Nausea Only   Penicillin G Itching    Other reaction(s):  itching   Rosuvastatin Other (See Comments)    REACTION: cramps Other reaction(s): leg cramps   Sulfamethoxazole-Trimethoprim Nausea Only and Other (See Comments)    REACTION: nausea, irreg. heartrate   Verapamil Swelling and Other (See Comments)    Chest pain   Adhesive [Tape] Rash    Band Aids   Sitagliptin Rash   Trovan [Trovafloxacin] Rash and Other (See Comments)    Social History:   Social History   Socioeconomic History   Marital status: Married    Spouse name: Not on file   Number of children: 2   Years of education: 11   Highest education level: Not on file  Occupational History   Occupation: Retired  Tobacco Use   Smoking status: Never   Smokeless tobacco: Never  Vaping Use   Vaping Use: Never used  Substance and Sexual Activity   Alcohol use: No    Comment: used to drink 30 yrs ago   Drug use: No   Sexual activity: Not on file  Other Topics Concern   Not on file  Social History Narrative   Regular exercise-yes   Caffeine use-yes   Lives with Husband and 3 graqdnsons and one is adopted   Husband drinks a lot-her husband drinks and smokes a lot   Used to be be married and is currently divorced   Daughters are on drugs   Social Determinants of Radio broadcast assistant Strain: Not on file  Food Insecurity: Not on file  Transportation Needs: Not on file  Physical Activity: Not on file  Stress: Not on file  Social Connections: Not on file  Intimate Partner Violence: Not on file    Family History:   Family History  Problem Relation Age of Onset   Heart attack Mother 70   Heart attack Brother 68   Heart attack Maternal Grandmother 67   Heart attack Maternal Grandfather 1   Cancer Daughter        Ovarian Cancer   Diabetes Other        Grandparent   Heart disease Other        Grandparent     ROS:  Please see the history of present illness.   All other ROS reviewed and negative.     Physical Exam/Data:   Vitals:   05/02/22 0400  05/02/22 0500 05/02/22 0558 05/02/22 0600  BP: (!) 145/56  (!) 145/66 (!) 132/94  Pulse: 80   83  Resp: 18 (!) 21  15  Temp:   98 F (36.7 C) 98 F (36.7 C)  TempSrc:   Oral Oral  SpO2: 95%   98%  Weight:      Height:       No  intake or output data in the 24 hours ending 05/02/22 0638    05/01/2022   10:44 PM 02/23/2022   10:20 AM 02/22/2022   11:42 AM  Last 3 Weights  Weight (lbs) 209 lb 209 lb 209 lb 9.6 oz  Weight (kg) 94.802 kg 94.802 kg 95.074 kg     Body mass index is 32.73 kg/m.  General:  Well nourished, well developed, in no acute distress HEENT: normal Neck: no JVD Vascular: No carotid bruits; Distal pulses 2+ bilaterally Cardiac:  normal S1, S2; RRR; no murmur  Lungs:  clear to auscultation bilaterally, no wheezing, rhonchi or rales  Abd: soft, nontender, no hepatomegaly  Ext: no edema Musculoskeletal:  No deformities, BUE and BLE strength normal and equal Skin: warm and dry  Neuro:  CNs 2-12 intact, no focal abnormalities noted Psych:  Normal affect   EKG:  The EKG was personally reviewed and demonstrates no ST elevation  Relevant CV Studies:  Echo June 2022:   1. Left ventricular ejection fraction, by estimation, is 70 to 75%. The  left ventricle has hyperdynamic function. The left ventricle has no  regional wall motion abnormalities. There is moderate asymmetric left  ventricular hypertrophy of the basal-septal   segment. Left ventricular diastolic parameters were normal.   2. Right ventricular systolic function is normal. The right ventricular  size is normal. Tricuspid regurgitation signal is inadequate for assessing  PA pressure.   3. The mitral valve is normal in structure. No evidence of mitral valve  regurgitation. No evidence of mitral stenosis.   4. The aortic valve was not well visualized. Aortic valve regurgitation  is not visualized. Mild to moderate aortic valve sclerosis/calcification  is present, without any evidence of aortic  stenosis.  Stress Test June 2022:  The left ventricular ejection fraction is hyperdynamic (>65%). Nuclear stress EF: 76%. There was no ST segment deviation noted during stress. No T wave inversion was noted during stress. The study is normal. This is a low risk study.  Holter 2023:  Predominant rhythm was normal sinus rhythm with an average heart rate of 76 bpm and ranged from 49 to 127 bpm. Rare PACs Rare PVCs, ventricular couplets and triplets as well as ventricular bigeminy and trigeminy. PVC load less than 1%. Occasional accelerated junctional rhythm Intermittent complete heart block occurring during sleep at 49 bpm lasting up to 7.9 seconds in duration with ventricular escape rhythm Dr. Ali Lowe who is DOD in the office was notified and monitor reviewed. Patient is to be set up for EP evaluation for pacemaker and go to the ER for any symptoms.  Laboratory Data:  High Sensitivity Troponin:   Recent Labs  Lab 05/01/22 1945 05/01/22 2141  TROPONINIHS 43* 323*     Chemistry Recent Labs  Lab 05/01/22 1945  NA 139  K 3.5  CL 107  CO2 23  GLUCOSE 200*  BUN 17  CREATININE 1.16*  CALCIUM 9.3  MG 1.8  GFRNONAA 49*  ANIONGAP 9    Recent Labs  Lab 05/01/22 1945  PROT 6.4*  ALBUMIN 3.6  AST 30  ALT 31  ALKPHOS 76  BILITOT 0.7   Lipids  Recent Labs  Lab 05/02/22 0351  CHOL 166  TRIG 185*  HDL 37*  LDLCALC 92  CHOLHDL 4.5    Hematology Recent Labs  Lab 05/01/22 1945 05/02/22 0351  WBC 7.8 8.0  RBC 4.35 4.54  HGB 13.0 13.3  HCT 38.3 40.2  MCV 88.0 88.5  MCH 29.9 29.3  MCHC 33.9 33.1  RDW 12.1 12.2  PLT 198 225   Thyroid No results for input(s): "TSH", "FREET4" in the last 168 hours.  BNPNo results for input(s): "BNP", "PROBNP" in the last 168 hours.  DDimer No results for input(s): "DDIMER" in the last 168 hours.   Radiology/Studies:  DG Chest 2 View  Result Date: 05/01/2022 CLINICAL DATA:  Chest pain and shortness of breath. EXAM: CHEST -  2 VIEW COMPARISON:  June 01, 2021 FINDINGS: The heart size and mediastinal contours are within normal limits. There is moderate severity calcification of the aortic arch. Both lungs are clear. There is mild, stable elevation of the right hemidiaphragm. Radiopaque surgical clips are seen within the right upper quadrant. Multilevel degenerative changes seen throughout the thoracic spine. IMPRESSION: No active cardiopulmonary disease. Electronically Signed   By: Virgina Norfolk M.D.   On: 05/01/2022 20:22     Assessment and Plan:   # NSTEMI # HTN # PVC  -Troponin elevated in the setting of chest pain which has since resolved. -Will treat as low risk NSTEMI. Agree with IV Heparin over weekend -Please get Echo in AM -Depending on Echo and third troponin, will most likely need LHC on Monday -Continue to trend EKG and troponin -Aspirin 325 followed by 81 mg daily -Continue with Statin -Telemetry for PVC and any evidence of CHB -Would avoid BB considering history of some mild incomplete CHB and anaphlaxis -Hold Cardizem  We will continue to follow along closely.   For questions or updates, please contact [place] Please consult [email or web address] for contact info under    Signed, Jaci Lazier, MD  05/02/2022 6:38 AM

## 2022-05-02 NOTE — ED Notes (Signed)
MD at [facility]

## 2022-05-02 NOTE — Progress Notes (Signed)
Pt put on add on board per Dr. Gasper Sells. He wrote pre-cath orders. I will adjust insulin to give 1/2 dose in AM.

## 2022-05-02 NOTE — Progress Notes (Signed)
  Echocardiogram 2D Echocardiogram has been performed.  Merrie Roof F 05/02/2022, 10:17 AM

## 2022-05-02 NOTE — ED Notes (Signed)
Pt to echo.

## 2022-05-02 NOTE — ED Notes (Signed)
Pt alert, NAD, calm, interactive, resps e/u, speaking in clear complete sentences. Lab at [facility].

## 2022-05-02 NOTE — Progress Notes (Signed)
Pt refused CPAP for tonight. Says she will let me know if she feels like she may need one later.

## 2022-05-03 ENCOUNTER — Inpatient Hospital Stay (HOSPITAL_COMMUNITY)
Admission: EM | Disposition: A | Discharge status: Home / Self Care | Payer: Self-pay | Source: Home / Self Care | Attending: Internal Medicine

## 2022-05-03 ENCOUNTER — Encounter (HOSPITAL_COMMUNITY): Payer: Self-pay | Admitting: Internal Medicine

## 2022-05-03 DIAGNOSIS — I214 Non-ST elevation (NSTEMI) myocardial infarction: Secondary | ICD-10-CM | POA: Diagnosis not present

## 2022-05-03 DIAGNOSIS — I2510 Atherosclerotic heart disease of native coronary artery without angina pectoris: Secondary | ICD-10-CM | POA: Diagnosis not present

## 2022-05-03 DIAGNOSIS — E782 Mixed hyperlipidemia: Secondary | ICD-10-CM | POA: Diagnosis not present

## 2022-05-03 DIAGNOSIS — I10 Essential (primary) hypertension: Secondary | ICD-10-CM | POA: Diagnosis not present

## 2022-05-03 DIAGNOSIS — N1831 Chronic kidney disease, stage 3a: Secondary | ICD-10-CM | POA: Diagnosis not present

## 2022-05-03 HISTORY — PX: LEFT HEART CATH AND CORONARY ANGIOGRAPHY: CATH118249

## 2022-05-03 HISTORY — PX: INTRAVASCULAR PRESSURE WIRE/FFR STUDY: CATH118243

## 2022-05-03 HISTORY — PX: CORONARY STENT INTERVENTION: CATH118234

## 2022-05-03 LAB — CBC
HCT: 38 % (ref 36.0–46.0)
Hemoglobin: 13.1 g/dL (ref 12.0–15.0)
MCH: 29.8 pg (ref 26.0–34.0)
MCHC: 34.5 g/dL (ref 30.0–36.0)
MCV: 86.6 fL (ref 80.0–100.0)
Platelets: 213 10*3/uL (ref 150–400)
RBC: 4.39 MIL/uL (ref 3.87–5.11)
RDW: 11.9 % (ref 11.5–15.5)
WBC: 9.6 10*3/uL (ref 4.0–10.5)
nRBC: 0 % (ref 0.0–0.2)

## 2022-05-03 LAB — BASIC METABOLIC PANEL
Anion gap: 8 (ref 5–15)
BUN: 18 mg/dL (ref 8–23)
CO2: 25 mmol/L (ref 22–32)
Calcium: 9.5 mg/dL (ref 8.9–10.3)
Chloride: 101 mmol/L (ref 98–111)
Creatinine, Ser: 1.26 mg/dL — ABNORMAL HIGH (ref 0.44–1.00)
GFR, Estimated: 45 mL/min — ABNORMAL LOW (ref >60)
Glucose, Bld: 246 mg/dL — ABNORMAL HIGH (ref 70–99)
Potassium: 3.8 mmol/L (ref 3.5–5.1)
Sodium: 134 mmol/L — ABNORMAL LOW (ref 135–145)

## 2022-05-03 LAB — GLUCOSE, CAPILLARY
Glucose-Capillary: 215 mg/dL — ABNORMAL HIGH (ref 70–99)
Glucose-Capillary: 147 mg/dL — ABNORMAL HIGH (ref 70–99)
Glucose-Capillary: 169 mg/dL — ABNORMAL HIGH (ref 70–99)
Glucose-Capillary: 233 mg/dL — ABNORMAL HIGH (ref 70–99)

## 2022-05-03 LAB — MAGNESIUM: Magnesium: 2.1 mg/dL (ref 1.7–2.4)

## 2022-05-03 LAB — POCT ACTIVATED CLOTTING TIME: Activated Clotting Time: 317 seconds

## 2022-05-03 LAB — HEPARIN LEVEL (UNFRACTIONATED): Heparin Unfractionated: 0.56 IU/mL (ref 0.30–0.70)

## 2022-05-03 LAB — LIPOPROTEIN A (LPA): Lipoprotein (a): 66.6 nmol/L — ABNORMAL HIGH (ref <75.0)

## 2022-05-03 SURGERY — LEFT HEART CATH AND CORONARY ANGIOGRAPHY
Anesthesia: LOCAL

## 2022-05-03 MED ORDER — DILTIAZEM HCL ER COATED BEADS 240 MG PO CP24
240.0000 mg | ORAL_CAPSULE | Freq: Every day | ORAL | Status: DC
  Administered 2022-05-03: 240 mg via ORAL
  Filled 2022-05-03: qty 2

## 2022-05-03 MED ORDER — FENTANYL CITRATE (PF) 100 MCG/2ML IJ SOLN
INTRAMUSCULAR | Status: DC | PRN
  Administered 2022-05-03: 50 ug via INTRAVENOUS
  Administered 2022-05-03 (×2): 25 ug via INTRAVENOUS
  Administered 2022-05-03: 50 ug via INTRAVENOUS

## 2022-05-03 MED ORDER — MIDAZOLAM HCL 2 MG/2ML IJ SOLN
INTRAMUSCULAR | Status: AC
  Filled 2022-05-03: qty 2

## 2022-05-03 MED ORDER — FENTANYL CITRATE (PF) 100 MCG/2ML IJ SOLN
INTRAMUSCULAR | Status: AC
  Filled 2022-05-03: qty 2

## 2022-05-03 MED ORDER — NITROGLYCERIN 1 MG/10 ML FOR IR/CATH LAB
INTRA_ARTERIAL | Status: AC
  Filled 2022-05-03: qty 10

## 2022-05-03 MED ORDER — ONDANSETRON HCL 4 MG/2ML IJ SOLN
INTRAMUSCULAR | Status: AC
  Filled 2022-05-03: qty 2

## 2022-05-03 MED ORDER — HEPARIN SODIUM (PORCINE) 1000 UNIT/ML IJ SOLN
INTRAMUSCULAR | Status: AC
  Filled 2022-05-03: qty 10

## 2022-05-03 MED ORDER — FAMOTIDINE IN NACL 20-0.9 MG/50ML-% IV SOLN
INTRAVENOUS | Status: AC | PRN
  Administered 2022-05-03: 20 mg via INTRAVENOUS

## 2022-05-03 MED ORDER — CLOPIDOGREL BISULFATE 300 MG PO TABS
ORAL_TABLET | ORAL | Status: AC
  Filled 2022-05-03: qty 1

## 2022-05-03 MED ORDER — HYDRALAZINE HCL 20 MG/ML IJ SOLN: 10.0000 mg | INTRAMUSCULAR | Status: AC | PRN

## 2022-05-03 MED ORDER — ALPRAZOLAM 0.5 MG PO TABS: 1.0000 mg | ORAL_TABLET | Freq: Two times a day (BID) | ORAL | Status: DC | PRN

## 2022-05-03 MED ORDER — HEPARIN (PORCINE) IN NACL 1000-0.9 UT/500ML-% IV SOLN
INTRAVENOUS | Status: DC | PRN
  Administered 2022-05-03 (×2): 500 mL

## 2022-05-03 MED ORDER — HYDRALAZINE HCL 20 MG/ML IJ SOLN: 10.0000 mg | INTRAMUSCULAR | Status: DC | PRN

## 2022-05-03 MED ORDER — VERAPAMIL HCL 2.5 MG/ML IV SOLN
INTRAVENOUS | Status: DC | PRN
  Administered 2022-05-03: 10 mL via INTRA_ARTERIAL

## 2022-05-03 MED ORDER — FENTANYL CITRATE PF 50 MCG/ML IJ SOSY: 25.0000 ug | PREFILLED_SYRINGE | INTRAMUSCULAR | Status: DC | PRN

## 2022-05-03 MED ORDER — SODIUM CHLORIDE 0.9% FLUSH: 3.0000 mL | INTRAVENOUS | Status: DC | PRN

## 2022-05-03 MED ORDER — VERAPAMIL HCL 2.5 MG/ML IV SOLN
INTRAVENOUS | Status: AC
  Filled 2022-05-03: qty 2

## 2022-05-03 MED ORDER — FAMOTIDINE IN NACL 20-0.9 MG/50ML-% IV SOLN
INTRAVENOUS | Status: AC
  Filled 2022-05-03: qty 50

## 2022-05-03 MED ORDER — INSULIN ASPART 100 UNIT/ML IJ SOLN
0.0000 [IU] | INTRAMUSCULAR | Status: DC
  Administered 2022-05-03: 3 [IU] via SUBCUTANEOUS
  Administered 2022-05-03: 7 [IU] via SUBCUTANEOUS

## 2022-05-03 MED ORDER — SODIUM CHLORIDE 0.9 % IV SOLN
250.0000 mL | INTRAVENOUS | Status: DC | PRN
Start: 2022-05-04 — End: 2022-05-04

## 2022-05-03 MED ORDER — SODIUM CHLORIDE 0.9 % WEIGHT BASED INFUSION
1.0000 mL/kg/h | INTRAVENOUS | Status: AC
  Administered 2022-05-03: 1 mL/kg/h via INTRAVENOUS

## 2022-05-03 MED ORDER — LIDOCAINE HCL (PF) 1 % IJ SOLN
INTRAMUSCULAR | Status: DC | PRN
  Administered 2022-05-03: 2 mL

## 2022-05-03 MED ORDER — CLOPIDOGREL BISULFATE 300 MG PO TABS
ORAL_TABLET | ORAL | Status: DC | PRN
  Administered 2022-05-03: 600 mg via ORAL

## 2022-05-03 MED ORDER — MIDAZOLAM HCL 2 MG/2ML IJ SOLN
INTRAMUSCULAR | Status: DC | PRN
  Administered 2022-05-03 (×3): 1 mg via INTRAVENOUS

## 2022-05-03 MED ORDER — HEPARIN SODIUM (PORCINE) 1000 UNIT/ML IJ SOLN
INTRAMUSCULAR | Status: DC | PRN
  Administered 2022-05-03: 5000 [IU] via INTRAVENOUS
  Administered 2022-05-03: 4000 [IU] via INTRAVENOUS

## 2022-05-03 MED ORDER — IOHEXOL 350 MG/ML SOLN
INTRAVENOUS | Status: DC | PRN
  Administered 2022-05-03: 80 mL

## 2022-05-03 MED ORDER — HEPARIN (PORCINE) IN NACL 1000-0.9 UT/500ML-% IV SOLN
INTRAVENOUS | Status: AC
  Filled 2022-05-03: qty 1000

## 2022-05-03 MED ORDER — LIDOCAINE HCL (PF) 1 % IJ SOLN
INTRAMUSCULAR | Status: AC
  Filled 2022-05-03: qty 30

## 2022-05-03 MED ORDER — INSULIN ASPART 100 UNIT/ML IJ SOLN
0.0000 [IU] | Freq: Three times a day (TID) | INTRAMUSCULAR | Status: DC
  Administered 2022-05-04: 4 [IU] via SUBCUTANEOUS

## 2022-05-03 MED ORDER — SODIUM CHLORIDE 0.9% FLUSH
3.0000 mL | Freq: Two times a day (BID) | INTRAVENOUS | Status: DC
Start: 2022-05-04 — End: 2022-05-04
  Administered 2022-05-04: 3 mL via INTRAVENOUS

## 2022-05-03 MED ORDER — CLOPIDOGREL BISULFATE 75 MG PO TABS
75.0000 mg | ORAL_TABLET | Freq: Every day | ORAL | Status: DC
  Administered 2022-05-04: 75 mg via ORAL
  Filled 2022-05-03: qty 1

## 2022-05-03 MED ORDER — ONDANSETRON HCL 4 MG/2ML IJ SOLN
INTRAMUSCULAR | Status: DC | PRN
  Administered 2022-05-03: 4 mg via INTRAVENOUS

## 2022-05-03 SURGICAL SUPPLY — 24 items
BALL SAPPHIRE NC24 2.50X12 (BALLOONS) ×2
BALL SAPPHIRE NC24 3.25X12 (BALLOONS) ×2
BALLN SAPPHIRE 2.5X12 (BALLOONS) ×2
BALLOON SAPPHIRE 2.5X12 (BALLOONS) IMPLANT
BALLOON SAPPHIRE NC24 2.50X12 (BALLOONS) IMPLANT
BALLOON SAPPHIRE NC24 3.25X12 (BALLOONS) IMPLANT
BAND ZEPHYR COMPRESS 30 LONG (HEMOSTASIS) ×1 IMPLANT
CATH 5FR JL3.5 JR4 ANG PIG MP (CATHETERS) ×1 IMPLANT
CATH LAUNCHER 5F EBU3.5 (CATHETERS) ×1 IMPLANT
CATH LAUNCHER 5F JR4 (CATHETERS) ×1 IMPLANT
ELECT DEFIB PAD ADLT CADENCE (PAD) ×1 IMPLANT
GLIDESHEATH SLEND SS 6F .021 (SHEATH) ×1 IMPLANT
GUIDEWIRE INQWIRE 1.5J.035X260 (WIRE) IMPLANT
GUIDEWIRE PRESSURE X 175 (WIRE) ×1 IMPLANT
INQWIRE 1.5J .035X260CM (WIRE) ×2
KIT ENCORE 26 ADVANTAGE (KITS) ×1 IMPLANT
KIT ESSENTIALS PG (KITS) ×1 IMPLANT
KIT HEART LEFT (KITS) ×2 IMPLANT
PACK CARDIAC CATHETERIZATION (CUSTOM PROCEDURE TRAY) ×2 IMPLANT
STENT SYNERGY XD 3.0X16 (Permanent Stent) IMPLANT
SYNERGY XD 3.0X16 (Permanent Stent) ×2 IMPLANT
TRANSDUCER W/STOPCOCK (MISCELLANEOUS) ×2 IMPLANT
TUBING CIL FLEX 10 FLL-RA (TUBING) ×2 IMPLANT
WIRE COUGAR XT STRL 190CM (WIRE) ×1 IMPLANT

## 2022-05-03 NOTE — Progress Notes (Signed)
[place] for heparin Indication: chest pain/ACS  Allergies  Allergen Reactions   Metoprolol Anaphylaxis   Nortriptyline Anaphylaxis    Other reaction(s): nightmares   Olmesartan Anaphylaxis    Other reaction(s): difficult urination, nausea   Tradjenta [Linagliptin] Shortness Of Breath   Alatrofloxacin Dermatitis   Beta Adrenergic Blockers Other (See Comments)    Reaction: Low Heart Rate   Cardizem [Diltiazem] Itching    Other reaction(s): sick  CANNOT TAKE CARDIZEM, DILTIAZEM OK   Cephalexin Diarrhea and Nausea And Vomiting   Codeine Other (See Comments)    tachycardia Other reaction(s): Palpitations   Colesevelam Hcl Nausea And Vomiting    Other reaction(s): sick to stomach   Crestor [Rosuvastatin Calcium] Other (See Comments)    Leg cramps   Flagyl [Metronidazole] Nausea Only   Fluvastatin Sodium Itching   Levemir [Insulin Detemir] Itching and Other (See Comments)    Bad mood   Lisinopril Nausea Only   Penicillin G Itching    Other reaction(s): itching   Rosuvastatin Other (See Comments)    REACTION: cramps Other reaction(s): leg cramps   Sulfamethoxazole-Trimethoprim Nausea Only and Other (See Comments)    REACTION: nausea, irreg. heartrate   Verapamil Swelling and Other (See Comments)    Chest pain   Adhesive [Tape] Rash    Band Aids   Sitagliptin Rash   Trovan [Trovafloxacin] Rash and Other (See Comments)    Patient Measurements: Height: '5\' 7"'$  (170.2 cm) Weight: 95.7 kg (210 lb 15.7 oz) IBW/kg (Calculated) : 61.6 Heparin Dosing Weight: 80kg  Vital Signs: Temp: 97.8 F (36.6 C) (07/24 0951) Temp Source: Oral (07/24 0951) BP: 153/75 (07/24 0951) Pulse Rate: 87 (07/24 0951)  Labs: Recent Labs    05/01/22 1945 05/01/22 2141 05/02/22 0351 05/02/22 0732 05/02/22 1044 05/03/22 0506  HGB 13.0  --  13.3  --   --  13.1  HCT 38.3  --  40.2  --   --  38.0  PLT 198  --  225  --   --  213  HEPARINUNFRC  --   --   --   0.40  --  0.56  CREATININE 1.16*  --   --   --   --  1.26*  TROPONINIHS 43* 323*  --  565* 482*  --      Estimated Creatinine Clearance: 45.8 mL/min (A) (by C-G formula based on SCr of 1.26 mg/dL (H)).   Assessment: 76 yo female with NSTEMI. Pharmacy consulted for heparin dosing. Heparin level therapeutic at 0.56. CBC stable, platelets 213. No reports of bleeding.   Goal of Therapy:  Heparin level 0.3-0.7 units/ml Monitor platelets by anticoagulation protocol: Yes   Plan:  Continue heparin at 11000 units / hr Monitor daily heparin levels and CBC. Monitor for bleeding  Jeneen Rinks, Pharm.D PGY1 Pharmacy Resident 05/03/2022 11:02 AM

## 2022-05-03 NOTE — Interval H&P Note (Signed)
History and Physical Interval Note:  05/03/2022 2:44 PM  Alice Cole  has presented today for surgery, with the diagnosis of NSTEMI.  The various methods of treatment have been discussed with the patient and family. After consideration of risks, benefits and other options for treatment, the patient has consented to  Procedure(s): LEFT HEART CATH AND CORONARY ANGIOGRAPHY (N/A) as a surgical intervention.  The patient's history has been reviewed, patient examined, no change in status, stable for surgery.  I have reviewed the patient's chart and labs.  Questions were answered to the patient's satisfaction.     Sherren Mocha

## 2022-05-03 NOTE — Progress Notes (Signed)
Pt refused CPAP for bed tonight but will call if she feels she needs it. Rt will cont to monitor as needed.

## 2022-05-03 NOTE — Progress Notes (Signed)
PROGRESS NOTE    Alice Cole  VFI:[id number] DOB: Oct 08, 1946 DOA: 05/01/2022 PCP: [facility]   Brief Narrative:    76 year old Caucasian female history of type 2 diabetes on insulin, CKD stage IIIa, hyperlipidemia, reflux, hypertension, sleep apnea on CPAP, presents to the ER from home with substernal chest pain.  She has been admitted with NSTEMI and has been started on heparin drip.  Cardiology anticipating possible LHC 7/24.  2D echocardiogram with preserved LVEF and no wall motion abnormalities.  Assessment & Plan:   Principal Problem:   NSTEMI (non-ST elevated myocardial infarction) ([place]) Active Problems:   Type 2 diabetes mellitus with chronic kidney disease, with long-term current use of insulin (HCC)   Mixed hyperlipidemia   GERD   Essential hypertension, benign   OSA (obstructive sleep apnea)   Chronic kidney disease, stage 3a (HCC)  Assessment and Plan:   NSTEMI (non-ST elevated myocardial infarction) ([place]) Continue IV heparin Appreciate ongoing cardiology consultation with echocardiogram pending Troponin trend increasing Continue aspirin and statin Avoid beta-blocker and Cardizem Cardiology planning for LHC today   Chronic kidney disease, stage 3a ([place]) Stable.  Baseline creatinine 1.16. Recheck labs in a.m. Holding HCTZ and ACE inhibitor today given mild creatinine bump  Mild hyponatremia Monitor, holding HCTZ Currently on normal saline   OSA (obstructive sleep apnea) Stable.  Continue CPAP.   Essential hypertension, benign Stable.  Holding home medications given mild creatinine bump with anticipated LHC today IV hydralazine ordered as needed   GERD Stable.  Continue Prilosec 20 mg daily.   Mixed hyperlipidemia Stable.  Continue Pravachol 40 mg daily.   Type 2 diabetes mellitus with chronic kidney disease, with long-term current use of insulin (HCC) A1c 7.4%.  Continue Lantus insulin scale insulin. Noted to have some mild  hyperglycemia as a result of prednisone given overnight due to contrast allergy Started SSI every 4 hours   Obesity BMI 32.73 Lifestyle changes outpatient     DVT prophylaxis: IV heparin drip Code Status: Full Family Communication: None at bedside Disposition Plan:  Status is: Inpatient Remains inpatient appropriate because: Need for IV medications.   Consultants:  Cardiology   Procedures:  None   Antimicrobials:  None    Subjective: Patient seen and evaluated today with no new acute complaints or concerns.  She denies any chest pain.  She was noted to have some worsening hyperglycemia after prednisone administration last night and refuses further doses as she is worried about her blood glucose elevating.  Objective: Vitals:   05/02/22 1414 05/02/22 2004 05/02/22 2352 05/03/22 0500  BP: (!) 153/70 (!) 142/116 (!) 117/51 137/71  Pulse: 87 88 77 98  Resp: '20 20 15 15  '$ Temp: 98 F (36.7 C) 98 F (36.7 C) 97.7 F (36.5 C) 97.7 F (36.5 C)  TempSrc: Oral Oral Oral Oral  SpO2: 96% 94% 96% 95%  Weight:    95.7 kg  Height:       No intake or output data in the 24 hours ending 05/03/22 0948 Filed Weights   05/01/22 2244 05/02/22 1411 05/03/22 0500  Weight: 94.8 kg 94.6 kg 95.7 kg    Examination:  General exam: Appears calm and comfortable  Respiratory system: Clear to auscultation. Respiratory effort normal. Cardiovascular system: S1 & S2 heard, RRR.  Gastrointestinal system: Abdomen is soft Central nervous system: Alert and awake Extremities: No edema Skin: No significant lesions noted Psychiatry: Flat affect.    Data Reviewed: I have personally reviewed following labs and  imaging studies  CBC: Recent Labs  Lab 05/01/22 1945 05/02/22 0351 05/03/22 0506  WBC 7.8 8.0 9.6  NEUTROABS 4.2  --   --   HGB 13.0 13.3 13.1  HCT 38.3 40.2 38.0  MCV 88.0 88.5 86.6  PLT 198 225 297   Basic Metabolic Panel: Recent Labs  Lab 05/01/22 1945 05/03/22 0506  NA  139 134*  K 3.5 3.8  CL 107 101  CO2 23 25  GLUCOSE 200* 246*  BUN 17 18  CREATININE 1.16* 1.26*  CALCIUM 9.3 9.5  MG 1.8 2.1   GFR: Estimated Creatinine Clearance: 45.8 mL/min (A) (by C-G formula based on SCr of 1.26 mg/dL (H)). Liver Function Tests: Recent Labs  Lab 05/01/22 1945  AST 30  ALT 31  ALKPHOS 76  BILITOT 0.7  PROT 6.4*  ALBUMIN 3.6   No results for input(s): "LIPASE", "AMYLASE" in the last 168 hours. No results for input(s): "AMMONIA" in the last 168 hours. Coagulation Profile: No results for input(s): "INR", "PROTIME" in the last 168 hours. Cardiac Enzymes: No results for input(s): "CKTOTAL", "CKMB", "CKMBINDEX", "TROPONINI" in the last 168 hours. BNP (last 3 results) No results for input(s): "PROBNP" in the last 8760 hours. HbA1C: Recent Labs    05/02/22 0351  HGBA1C 7.4*   CBG: Recent Labs  Lab 05/02/22 0734 05/02/22 1134 05/02/22 1645 05/02/22 2139 05/03/22 0601  GLUCAP 193* 230* 303* 332* 215*   Lipid Profile: Recent Labs    05/02/22 0351  CHOL 166  HDL 37*  LDLCALC 92  TRIG 185*  CHOLHDL 4.5   Thyroid Function Tests: No results for input(s): "TSH", "T4TOTAL", "FREET4", "T3FREE", "THYROIDAB" in the last 72 hours. Anemia Panel: No results for input(s): "VITAMINB12", "FOLATE", "FERRITIN", "TIBC", "IRON", "RETICCTPCT" in the last 72 hours. Sepsis Labs: No results for input(s): "PROCALCITON", "LATICACIDVEN" in the last 168 hours.  No results found for this or any previous visit (from the past 240 hour(s)).       Radiology Studies: ECHOCARDIOGRAM COMPLETE  Result Date: 05/02/2022    ECHOCARDIOGRAM REPORT   Patient Name:   TALYNN LEBON Date of Exam: 05/02/2022 Medical Rec #:  [id number]   Height:       67.0 in Accession #:    [id number]  Weight:       209.0 lb Date of Birth:  1946-01-16   BSA:          2.060 m Patient Age:    63 years    BP:           105/88 mmHg Patient Gender: F           HR:           87 bpm. Exam Location:   Inpatient Procedure: 2D Echo, 3D Echo, Cardiac Doppler and Color Doppler Indications:    Chest pain  History:        Patient has prior history of Echocardiogram examinations, most                 recent 04/01/2021. Abnormal ECG, Arrythmias:PVC and Bigeminy,                 Signs/Symptoms:Shortness of Breath and Chest Pain; Risk                 Factors:Sleep Apnea and Hypertension. Elevated troponin.  Sonographer:    Merrie Roof RDCS Referring Phys: [place]  1. Left ventricular ejection fraction, by estimation, is 65 to 70%. Left ventricular  ejection fraction by 3D volume is 63 %. The left ventricle has normal function. The left ventricle has no regional wall motion abnormalities. There is moderate asymmetric left ventricular hypertrophy of the basal-septal segment. Left ventricular diastolic parameters are consistent with Grade I diastolic dysfunction (impaired relaxation).  2. Right ventricular systolic function is normal. The right ventricular size is normal. Tricuspid regurgitation signal is inadequate for assessing PA pressure.  3. The mitral valve is normal in structure. No evidence of mitral valve regurgitation. No evidence of mitral stenosis.  4. The aortic valve is tricuspid. There is mild thickening of the aortic valve. Aortic valve regurgitation is not visualized. Aortic valve sclerosis is present, with no evidence of aortic valve stenosis. Comparison(s): No significant change from prior study. FINDINGS  Left Ventricle: Left ventricular ejection fraction, by estimation, is 65 to 70%. Left ventricular ejection fraction by 3D volume is 63 %. The left ventricle has normal function. The left ventricle has no regional wall motion abnormalities. The left ventricular internal cavity size was normal in size. There is moderate asymmetric left ventricular hypertrophy of the basal-septal segment. Left ventricular diastolic parameters are consistent with Grade I diastolic dysfunction (impaired  relaxation). Right Ventricle: The right ventricular size is normal. No increase in right ventricular wall thickness. Right ventricular systolic function is normal. Tricuspid regurgitation signal is inadequate for assessing PA pressure. Left Atrium: Left atrial size was normal in size. Right Atrium: Right atrial size was normal in size. Pericardium: There is no evidence of pericardial effusion. Presence of epicardial fat layer. Mitral Valve: The mitral valve is normal in structure. No evidence of mitral valve regurgitation. No evidence of mitral valve stenosis. Tricuspid Valve: The tricuspid valve is normal in structure. Tricuspid valve regurgitation is not demonstrated. No evidence of tricuspid stenosis. Aortic Valve: The aortic valve is tricuspid. There is mild thickening of the aortic valve. Aortic valve regurgitation is not visualized. Aortic valve sclerosis is present, with no evidence of aortic valve stenosis. Pulmonic Valve: The pulmonic valve was normal in structure. Pulmonic valve regurgitation is trivial. No evidence of pulmonic stenosis. Aorta: The aortic root is normal in size and structure. IAS/Shunts: No atrial level shunt detected by color flow Doppler.  LEFT VENTRICLE PLAX 2D LVIDd:         3.90 cm         Diastology LVIDs:         2.30 cm         LV e' medial:    8.05 cm/s LV PW:         1.20 cm         LV E/e' medial:  10.3 LV IVS:        1.10 cm         LV e' lateral:   8.49 cm/s LVOT diam:     2.10 cm         LV E/e' lateral: 9.8 LV SV:         86 LV SV Index:   42 LVOT Area:     3.46 cm        3D Volume EF                                LV 3D EF:    Left  ventricul                                             ar                                             ejection                                             fraction                                             by 3D                                             volume is                                              63 %.                                 3D Volume EF:                                3D EF:        63 %                                LV EDV:       106 ml                                LV ESV:       39 ml                                LV SV:        67 ml RIGHT VENTRICLE RV Basal diam:  2.80 cm RV S prime:     9.90 cm/s TAPSE (M-mode): 2.5 cm LEFT ATRIUM             Index        RIGHT ATRIUM           Index LA diam:        2.90 cm 1.41 cm/m   RA Area:     14.10 cm LA Vol (A2C):   39.0 ml 18.93 ml/m  RA Volume:   30.60 ml  14.85 ml/m LA Vol (A4C):   24.9 ml 12.08 ml/m LA Biplane Vol: 31.5 ml 15.29 ml/m  AORTIC VALVE LVOT Vmax:   132.00 cm/s LVOT  Vmean:  76.200 cm/s LVOT VTI:    0.247 m  AORTA Ao Root diam: 3.50 cm Ao Asc diam:  3.00 cm MITRAL VALVE MV Area (PHT): 2.64 cm     SHUNTS MV Decel Time: 287 msec     Systemic VTI:  0.25 m MV E velocity: 83.20 cm/s   Systemic Diam: 2.10 cm MV A velocity: 114.00 cm/s MV E/A ratio:  0.73 Rudean Haskell MD Electronically signed by Rudean Haskell MD Signature Date/Time: 05/02/2022/10:29:43 AM    Final    DG Chest 2 View  Result Date: 05/01/2022 CLINICAL DATA:  Chest pain and shortness of breath. EXAM: CHEST - 2 VIEW COMPARISON:  June 01, 2021 FINDINGS: The heart size and mediastinal contours are within normal limits. There is moderate severity calcification of the aortic arch. Both lungs are clear. There is mild, stable elevation of the right hemidiaphragm. Radiopaque surgical clips are seen within the right upper quadrant. Multilevel degenerative changes seen throughout the thoracic spine. IMPRESSION: No active cardiopulmonary disease. Electronically Signed   By: Virgina Norfolk M.D.   On: 05/01/2022 20:22        Scheduled Meds:  [START ON 05/04/2022] aspirin EC  81 mg Oral Daily   diphenhydrAMINE  50 mg Oral Once   Or   diphenhydrAMINE  50 mg Intravenous Once   insulin aspart  0-20 Units Subcutaneous Q4H   insulin aspart  0-5 Units  Subcutaneous QHS   insulin glargine-yfgn  12 Units Subcutaneous Daily   Followed by   Derrill Memo ON 05/04/2022] insulin glargine-yfgn  24 Units Subcutaneous Daily   pantoprazole  40 mg Oral Daily   pravastatin  40 mg Oral QHS   predniSONE  50 mg Oral Q6H   sodium chloride flush  3 mL Intravenous Q12H   Continuous Infusions:  sodium chloride     sodium chloride 1 mL/kg/hr (05/03/22 0615)   heparin 1,100 Units/hr (05/02/22 1728)     LOS: 2 days    Time spent: 35 minutes    Lamontae Ricardo Darleen Crocker, DO [facility]  If 7PM-7AM, please contact night-coverage [email or web address] 05/03/2022, 9:48 AM

## 2022-05-03 NOTE — Progress Notes (Signed)
Progress Note  Patient Name: Alice Cole Date of Encounter: 05/03/2022  [facility] Cardiologist: Fransico Him, MD   Subjective   Patient feeling well this AM. Denies chest pain, sob. Occasional palpitations. NPO for cath today    Inpatient Medications    Scheduled Meds:  [START ON 05/04/2022] aspirin EC  81 mg Oral Daily   diphenhydrAMINE  50 mg Oral Once   Or   diphenhydrAMINE  50 mg Intravenous Once   insulin aspart  0-20 Units Subcutaneous Q4H   insulin aspart  0-5 Units Subcutaneous QHS   insulin glargine-yfgn  12 Units Subcutaneous Daily   Followed by   Derrill Memo ON 05/04/2022] insulin glargine-yfgn  24 Units Subcutaneous Daily   pantoprazole  40 mg Oral Daily   pravastatin  40 mg Oral QHS   predniSONE  50 mg Oral Q6H   sodium chloride flush  3 mL Intravenous Q12H   Continuous Infusions:  sodium chloride     sodium chloride 1 mL/kg/hr (05/03/22 0615)   heparin 1,100 Units/hr (05/02/22 1728)   PRN Meds: sodium chloride, acetaminophen, ALPRAZolam, hydrALAZINE, melatonin, nitroGLYCERIN, ondansetron (ZOFRAN) IV, sodium chloride flush   Vital Signs    Vitals:   05/02/22 1414 05/02/22 2004 05/02/22 2352 05/03/22 0500  BP: (!) 153/70 (!) 142/116 (!) 117/51 137/71  Pulse: 87 88 77 98  Resp: '20 20 15 15  '$ Temp: 98 F (36.7 C) 98 F (36.7 C) 97.7 F (36.5 C) 97.7 F (36.5 C)  TempSrc: Oral Oral Oral Oral  SpO2: 96% 94% 96% 95%  Weight:    95.7 kg  Height:       No intake or output data in the 24 hours ending 05/03/22 0920    05/03/2022    5:00 AM 05/02/2022    2:11 PM 05/01/2022   10:44 PM  Last 3 Weights  Weight (lbs) 210 lb 15.7 oz 208 lb 8 oz 209 lb  Weight (kg) 95.7 kg 94.575 kg 94.802 kg      Telemetry    Sinus rhythm with frequent PVCs - Personally Reviewed  ECG    Sinus rhythm with PVCs, HR 83 BPM, inferior infarct pattern - Personally Reviewed  Physical Exam   GEN: No acute distress.  Laying comfortably in the bed  Neck: No JVD Cardiac:  RRR, no murmurs, rubs, or gallops. Radial pulses 2+ bilaterally  Respiratory: Clear to auscultation bilaterally. GI: Soft, nontender, non-distended  MS: No edema; No deformity. Neuro:  Nonfocal  Psych: Normal affect   Labs    High Sensitivity Troponin:   Recent Labs  Lab 05/01/22 1945 05/01/22 2141 05/02/22 0732 05/02/22 1044  TROPONINIHS 43* 323* 565* 482*     Chemistry Recent Labs  Lab 05/01/22 1945 05/03/22 0506  NA 139 134*  K 3.5 3.8  CL 107 101  CO2 23 25  GLUCOSE 200* 246*  BUN 17 18  CREATININE 1.16* 1.26*  CALCIUM 9.3 9.5  MG 1.8 2.1  PROT 6.4*  --   ALBUMIN 3.6  --   AST 30  --   ALT 31  --   ALKPHOS 76  --   BILITOT 0.7  --   GFRNONAA 49* 45*  ANIONGAP 9 8    Lipids  Recent Labs  Lab 05/02/22 0351  CHOL 166  TRIG 185*  HDL 37*  LDLCALC 92  CHOLHDL 4.5    Hematology Recent Labs  Lab 05/01/22 1945 05/02/22 0351 05/03/22 0506  WBC 7.8 8.0 9.6  RBC 4.35 4.54  4.39  HGB 13.0 13.3 13.1  HCT 38.3 40.2 38.0  MCV 88.0 88.5 86.6  MCH 29.9 29.3 29.8  MCHC 33.9 33.1 34.5  RDW 12.1 12.2 11.9  PLT 198 225 213   Thyroid No results for input(s): "TSH", "FREET4" in the last 168 hours.  BNPNo results for input(s): "BNP", "PROBNP" in the last 168 hours.  DDimer No results for input(s): "DDIMER" in the last 168 hours.   Radiology    ECHOCARDIOGRAM COMPLETE  Result Date: 05/02/2022    ECHOCARDIOGRAM REPORT   Patient Name:   Alice Cole Date of Exam: 05/02/2022 Medical Rec #:  [id number]   Height:       67.0 in Accession #:    [id number]  Weight:       209.0 lb Date of Birth:  September 14, 1946   BSA:          2.060 m Patient Age:    76 years    BP:           105/88 mmHg Patient Gender: F           HR:           87 bpm. Exam Location:  Inpatient Procedure: 2D Echo, 3D Echo, Cardiac Doppler and Color Doppler Indications:    Chest pain  History:        Patient has prior history of Echocardiogram examinations, most                 recent 04/01/2021. Abnormal ECG,  Arrythmias:PVC and Bigeminy,                 Signs/Symptoms:Shortness of Breath and Chest Pain; Risk                 Factors:Sleep Apnea and Hypertension. Elevated troponin.  Sonographer:    Merrie Roof RDCS Referring Phys: [place]  1. Left ventricular ejection fraction, by estimation, is 65 to 70%. Left ventricular ejection fraction by 3D volume is 63 %. The left ventricle has normal function. The left ventricle has no regional wall motion abnormalities. There is moderate asymmetric left ventricular hypertrophy of the basal-septal segment. Left ventricular diastolic parameters are consistent with Grade I diastolic dysfunction (impaired relaxation).  2. Right ventricular systolic function is normal. The right ventricular size is normal. Tricuspid regurgitation signal is inadequate for assessing PA pressure.  3. The mitral valve is normal in structure. No evidence of mitral valve regurgitation. No evidence of mitral stenosis.  4. The aortic valve is tricuspid. There is mild thickening of the aortic valve. Aortic valve regurgitation is not visualized. Aortic valve sclerosis is present, with no evidence of aortic valve stenosis. Comparison(s): No significant change from prior study. FINDINGS  Left Ventricle: Left ventricular ejection fraction, by estimation, is 65 to 70%. Left ventricular ejection fraction by 3D volume is 63 %. The left ventricle has normal function. The left ventricle has no regional wall motion abnormalities. The left ventricular internal cavity size was normal in size. There is moderate asymmetric left ventricular hypertrophy of the basal-septal segment. Left ventricular diastolic parameters are consistent with Grade I diastolic dysfunction (impaired relaxation). Right Ventricle: The right ventricular size is normal. No increase in right ventricular wall thickness. Right ventricular systolic function is normal. Tricuspid regurgitation signal is inadequate for assessing PA  pressure. Left Atrium: Left atrial size was normal in size. Right Atrium: Right atrial size was normal in size. Pericardium: There is no evidence of pericardial effusion. Presence of  epicardial fat layer. Mitral Valve: The mitral valve is normal in structure. No evidence of mitral valve regurgitation. No evidence of mitral valve stenosis. Tricuspid Valve: The tricuspid valve is normal in structure. Tricuspid valve regurgitation is not demonstrated. No evidence of tricuspid stenosis. Aortic Valve: The aortic valve is tricuspid. There is mild thickening of the aortic valve. Aortic valve regurgitation is not visualized. Aortic valve sclerosis is present, with no evidence of aortic valve stenosis. Pulmonic Valve: The pulmonic valve was normal in structure. Pulmonic valve regurgitation is trivial. No evidence of pulmonic stenosis. Aorta: The aortic root is normal in size and structure. IAS/Shunts: No atrial level shunt detected by color flow Doppler.  LEFT VENTRICLE PLAX 2D LVIDd:         3.90 cm         Diastology LVIDs:         2.30 cm         LV e' medial:    8.05 cm/s LV PW:         1.20 cm         LV E/e' medial:  10.3 LV IVS:        1.10 cm         LV e' lateral:   8.49 cm/s LVOT diam:     2.10 cm         LV E/e' lateral: 9.8 LV SV:         86 LV SV Index:   42 LVOT Area:     3.46 cm        3D Volume EF                                LV 3D EF:    Left                                             ventricul                                             ar                                             ejection                                             fraction                                             by 3D                                             volume is  63 %.                                 3D Volume EF:                                3D EF:        63 %                                LV EDV:       106 ml                                LV ESV:       39 ml                                 LV SV:        67 ml RIGHT VENTRICLE RV Basal diam:  2.80 cm RV S prime:     9.90 cm/s TAPSE (M-mode): 2.5 cm LEFT ATRIUM             Index        RIGHT ATRIUM           Index LA diam:        2.90 cm 1.41 cm/m   RA Area:     14.10 cm LA Vol (A2C):   39.0 ml 18.93 ml/m  RA Volume:   30.60 ml  14.85 ml/m LA Vol (A4C):   24.9 ml 12.08 ml/m LA Biplane Vol: 31.5 ml 15.29 ml/m  AORTIC VALVE LVOT Vmax:   132.00 cm/s LVOT Vmean:  76.200 cm/s LVOT VTI:    0.247 m  AORTA Ao Root diam: 3.50 cm Ao Asc diam:  3.00 cm MITRAL VALVE MV Area (PHT): 2.64 cm     SHUNTS MV Decel Time: 287 msec     Systemic VTI:  0.25 m MV E velocity: 83.20 cm/s   Systemic Diam: 2.10 cm MV A velocity: 114.00 cm/s MV E/A ratio:  0.73 Rudean Haskell MD Electronically signed by Rudean Haskell MD Signature Date/Time: 05/02/2022/10:29:43 AM    Final    DG Chest 2 View  Result Date: 05/01/2022 CLINICAL DATA:  Chest pain and shortness of breath. EXAM: CHEST - 2 VIEW COMPARISON:  June 01, 2021 FINDINGS: The heart size and mediastinal contours are within normal limits. There is moderate severity calcification of the aortic arch. Both lungs are clear. There is mild, stable elevation of the right hemidiaphragm. Radiopaque surgical clips are seen within the right upper quadrant. Multilevel degenerative changes seen throughout the thoracic spine. IMPRESSION: No active cardiopulmonary disease. Electronically Signed   By: Virgina Norfolk M.D.   On: 05/01/2022 20:22    Cardiac Studies   Echocardiogram 05/02/22   1. Left ventricular ejection fraction, by estimation, is 65 to 70%. Left  ventricular ejection fraction by 3D volume is 63 %. The left ventricle has  normal function. The left ventricle has no regional wall motion  abnormalities. There is moderate  asymmetric left ventricular hypertrophy of the basal-septal segment. Left  ventricular diastolic parameters are consistent with Grade I diastolic  dysfunction  (impaired relaxation).   2. Right ventricular systolic  function is normal. The right ventricular  size is normal. Tricuspid regurgitation signal is inadequate for assessing  PA pressure.   3. The mitral valve is normal in structure. No evidence of mitral valve  regurgitation. No evidence of mitral stenosis.   4. The aortic valve is tricuspid. There is mild thickening of the aortic  valve. Aortic valve regurgitation is not visualized. Aortic valve  sclerosis is present, with no evidence of aortic valve stenosis.   Comparison(s): No significant change from prior study.   Patient Profile     76 y.o. female with a past medical history of type 2 DM, CKD stage IIIa, hyperlipidemia, GERD, HTN, sleep apnea on CPAP who is being seen for the evaluation of chest pain.   Assessment & Plan    NSTEMI  - Patient presented complaining of mid sternal chest pain that occurred when she was arguing with her grandson  - hsTn 43>>565>>482  - On IV heparin, planned to undergo LHC today. Has a documented history of nausea with contrast, however she does not remember. Was pre-treated in case  - Continue ASA 81 mg daily  - Continue statin  - Not on BB due to history of anaphylaxis on metoprolol  - Echocardiogram this admission with EF 65-70%, no regional wall motion abnormalities, moderate LVH, grade I diastolic dysfunction   HTN  - Continue home HCTZ 25 mg daily, altace 10 mg BID  - Consider resuming home diltiazem 240 mg daily   PVCs  - Continue monitoring on telemetry  - Maintain K>4, mag >2  - Not on BB due to history of anaphylaxis on metoprolol.  - Also has a documented intolerance to cardizem, but reports that diltiazem is OK and she has been on this for many years. Consider resuming home diltiazem to help suppress PVCs        For questions or updates, please contact [place] Please consult [email or web address] for contact info under        Signed, Margie Billet, PA-C  05/03/2022,  9:20 AM

## 2022-05-03 NOTE — Inpatient Diabetes Management (Signed)
Inpatient Diabetes Program Recommendations  AACE/ADA: New Consensus Statement on Inpatient Glycemic Control (2015)  Target Ranges:  Prepandial:   less than 140 mg/dL      Peak postprandial:   less than 180 mg/dL (1-2 hours)      Critically ill patients:  140 - 180 mg/dL   Lab Results  Component Value Date   GLUCAP 215 (H) 05/03/2022   HGBA1C 7.4 (H) 05/02/2022    Review of Glycemic Control  Latest Reference Range & Units 05/02/22 16:45 05/02/22 21:39 05/03/22 06:01  Glucose-Capillary 70 - 99 mg/dL 303 (H) 332 (H) 215 (H)  (H): Data is abnormally high Diabetes history: Type 2 DM Outpatient Diabetes medications: Novolog 40/38/50, Lantus 24 units QD Current orders for Inpatient glycemic control: Semglee 24 units QD, Novolog 0-20 units TID, Novolog 0-5 units QHS Prednisone 50 mg x 1  Inpatient Diabetes Program Recommendations:    Once diet progresses, consider adding Novolog 5 units TID (assuming patient consumes >50% of meals).   Thanks, Bronson Curb, MSN, RNC-OB Diabetes Coordinator [phone] (8a-5p)

## 2022-05-03 NOTE — Progress Notes (Signed)
   05/03/22 0940  Clinical Encounter Type  Visited With Patient  Visit Type Spiritual support  Referral From Nurse  Consult/Referral To Chaplain   Chaplain responded to a spiritual consult request for advanced directive information.  I went over the forms with the patient, Alice Cole and she is confident she will be able to work on them. As she commented " now I have something to do." I advised the Alice Cole that if she encountered any questions to let her nurse know and someone will respond.   Danice Goltz [facility]  [phone]

## 2022-05-03 NOTE — H&P (View-Only) (Signed)
Progress Note  Patient Name: Alice Cole Date of Encounter: 05/03/2022  [facility] Cardiologist: Fransico Him, MD   Subjective   Patient feeling well this AM. Denies chest pain, sob. Occasional palpitations. NPO for cath today    Inpatient Medications    Scheduled Meds:  [START ON 05/04/2022] aspirin EC  81 mg Oral Daily   diphenhydrAMINE  50 mg Oral Once   Or   diphenhydrAMINE  50 mg Intravenous Once   insulin aspart  0-20 Units Subcutaneous Q4H   insulin aspart  0-5 Units Subcutaneous QHS   insulin glargine-yfgn  12 Units Subcutaneous Daily   Followed by   Derrill Memo ON 05/04/2022] insulin glargine-yfgn  24 Units Subcutaneous Daily   pantoprazole  40 mg Oral Daily   pravastatin  40 mg Oral QHS   predniSONE  50 mg Oral Q6H   sodium chloride flush  3 mL Intravenous Q12H   Continuous Infusions:  sodium chloride     sodium chloride 1 mL/kg/hr (05/03/22 0615)   heparin 1,100 Units/hr (05/02/22 1728)   PRN Meds: sodium chloride, acetaminophen, ALPRAZolam, hydrALAZINE, melatonin, nitroGLYCERIN, ondansetron (ZOFRAN) IV, sodium chloride flush   Vital Signs    Vitals:   05/02/22 1414 05/02/22 2004 05/02/22 2352 05/03/22 0500  BP: (!) 153/70 (!) 142/116 (!) 117/51 137/71  Pulse: 87 88 77 98  Resp: '20 20 15 15  '$ Temp: 98 F (36.7 C) 98 F (36.7 C) 97.7 F (36.5 C) 97.7 F (36.5 C)  TempSrc: Oral Oral Oral Oral  SpO2: 96% 94% 96% 95%  Weight:    95.7 kg  Height:       No intake or output data in the 24 hours ending 05/03/22 0920    05/03/2022    5:00 AM 05/02/2022    2:11 PM 05/01/2022   10:44 PM  Last 3 Weights  Weight (lbs) 210 lb 15.7 oz 208 lb 8 oz 209 lb  Weight (kg) 95.7 kg 94.575 kg 94.802 kg      Telemetry    Sinus rhythm with frequent PVCs - Personally Reviewed  ECG    Sinus rhythm with PVCs, HR 83 BPM, inferior infarct pattern - Personally Reviewed  Physical Exam   GEN: No acute distress.  Laying comfortably in the bed  Neck: No JVD Cardiac:  RRR, no murmurs, rubs, or gallops. Radial pulses 2+ bilaterally  Respiratory: Clear to auscultation bilaterally. GI: Soft, nontender, non-distended  MS: No edema; No deformity. Neuro:  Nonfocal  Psych: Normal affect   Labs    High Sensitivity Troponin:   Recent Labs  Lab 05/01/22 1945 05/01/22 2141 05/02/22 0732 05/02/22 1044  TROPONINIHS 43* 323* 565* 482*     Chemistry Recent Labs  Lab 05/01/22 1945 05/03/22 0506  NA 139 134*  K 3.5 3.8  CL 107 101  CO2 23 25  GLUCOSE 200* 246*  BUN 17 18  CREATININE 1.16* 1.26*  CALCIUM 9.3 9.5  MG 1.8 2.1  PROT 6.4*  --   ALBUMIN 3.6  --   AST 30  --   ALT 31  --   ALKPHOS 76  --   BILITOT 0.7  --   GFRNONAA 49* 45*  ANIONGAP 9 8    Lipids  Recent Labs  Lab 05/02/22 0351  CHOL 166  TRIG 185*  HDL 37*  LDLCALC 92  CHOLHDL 4.5    Hematology Recent Labs  Lab 05/01/22 1945 05/02/22 0351 05/03/22 0506  WBC 7.8 8.0 9.6  RBC 4.35 4.54  4.39  HGB 13.0 13.3 13.1  HCT 38.3 40.2 38.0  MCV 88.0 88.5 86.6  MCH 29.9 29.3 29.8  MCHC 33.9 33.1 34.5  RDW 12.1 12.2 11.9  PLT 198 225 213   Thyroid No results for input(s): "TSH", "FREET4" in the last 168 hours.  BNPNo results for input(s): "BNP", "PROBNP" in the last 168 hours.  DDimer No results for input(s): "DDIMER" in the last 168 hours.   Radiology    ECHOCARDIOGRAM COMPLETE  Result Date: 05/02/2022    ECHOCARDIOGRAM REPORT   Patient Name:   Alice Cole Date of Exam: 05/02/2022 Medical Rec #:  [id number]   Height:       67.0 in Accession #:    [id number]  Weight:       209.0 lb Date of Birth:  Dec 10, 1945   BSA:          2.060 m Patient Age:    76 years    BP:           105/88 mmHg Patient Gender: F           HR:           87 bpm. Exam Location:  Inpatient Procedure: 2D Echo, 3D Echo, Cardiac Doppler and Color Doppler Indications:    Chest pain  History:        Patient has prior history of Echocardiogram examinations, most                 recent 04/01/2021. Abnormal ECG,  Arrythmias:PVC and Bigeminy,                 Signs/Symptoms:Shortness of Breath and Chest Pain; Risk                 Factors:Sleep Apnea and Hypertension. Elevated troponin.  Sonographer:    Merrie Roof RDCS Referring Phys: [place]  1. Left ventricular ejection fraction, by estimation, is 65 to 70%. Left ventricular ejection fraction by 3D volume is 63 %. The left ventricle has normal function. The left ventricle has no regional wall motion abnormalities. There is moderate asymmetric left ventricular hypertrophy of the basal-septal segment. Left ventricular diastolic parameters are consistent with Grade I diastolic dysfunction (impaired relaxation).  2. Right ventricular systolic function is normal. The right ventricular size is normal. Tricuspid regurgitation signal is inadequate for assessing PA pressure.  3. The mitral valve is normal in structure. No evidence of mitral valve regurgitation. No evidence of mitral stenosis.  4. The aortic valve is tricuspid. There is mild thickening of the aortic valve. Aortic valve regurgitation is not visualized. Aortic valve sclerosis is present, with no evidence of aortic valve stenosis. Comparison(s): No significant change from prior study. FINDINGS  Left Ventricle: Left ventricular ejection fraction, by estimation, is 65 to 70%. Left ventricular ejection fraction by 3D volume is 63 %. The left ventricle has normal function. The left ventricle has no regional wall motion abnormalities. The left ventricular internal cavity size was normal in size. There is moderate asymmetric left ventricular hypertrophy of the basal-septal segment. Left ventricular diastolic parameters are consistent with Grade I diastolic dysfunction (impaired relaxation). Right Ventricle: The right ventricular size is normal. No increase in right ventricular wall thickness. Right ventricular systolic function is normal. Tricuspid regurgitation signal is inadequate for assessing PA  pressure. Left Atrium: Left atrial size was normal in size. Right Atrium: Right atrial size was normal in size. Pericardium: There is no evidence of pericardial effusion. Presence of  epicardial fat layer. Mitral Valve: The mitral valve is normal in structure. No evidence of mitral valve regurgitation. No evidence of mitral valve stenosis. Tricuspid Valve: The tricuspid valve is normal in structure. Tricuspid valve regurgitation is not demonstrated. No evidence of tricuspid stenosis. Aortic Valve: The aortic valve is tricuspid. There is mild thickening of the aortic valve. Aortic valve regurgitation is not visualized. Aortic valve sclerosis is present, with no evidence of aortic valve stenosis. Pulmonic Valve: The pulmonic valve was normal in structure. Pulmonic valve regurgitation is trivial. No evidence of pulmonic stenosis. Aorta: The aortic root is normal in size and structure. IAS/Shunts: No atrial level shunt detected by color flow Doppler.  LEFT VENTRICLE PLAX 2D LVIDd:         3.90 cm         Diastology LVIDs:         2.30 cm         LV e' medial:    8.05 cm/s LV PW:         1.20 cm         LV E/e' medial:  10.3 LV IVS:        1.10 cm         LV e' lateral:   8.49 cm/s LVOT diam:     2.10 cm         LV E/e' lateral: 9.8 LV SV:         86 LV SV Index:   42 LVOT Area:     3.46 cm        3D Volume EF                                LV 3D EF:    Left                                             ventricul                                             ar                                             ejection                                             fraction                                             by 3D                                             volume is  63 %.                                 3D Volume EF:                                3D EF:        63 %                                LV EDV:       106 ml                                LV ESV:       39 ml                                 LV SV:        67 ml RIGHT VENTRICLE RV Basal diam:  2.80 cm RV S prime:     9.90 cm/s TAPSE (M-mode): 2.5 cm LEFT ATRIUM             Index        RIGHT ATRIUM           Index LA diam:        2.90 cm 1.41 cm/m   RA Area:     14.10 cm LA Vol (A2C):   39.0 ml 18.93 ml/m  RA Volume:   30.60 ml  14.85 ml/m LA Vol (A4C):   24.9 ml 12.08 ml/m LA Biplane Vol: 31.5 ml 15.29 ml/m  AORTIC VALVE LVOT Vmax:   132.00 cm/s LVOT Vmean:  76.200 cm/s LVOT VTI:    0.247 m  AORTA Ao Root diam: 3.50 cm Ao Asc diam:  3.00 cm MITRAL VALVE MV Area (PHT): 2.64 cm     SHUNTS MV Decel Time: 287 msec     Systemic VTI:  0.25 m MV E velocity: 83.20 cm/s   Systemic Diam: 2.10 cm MV A velocity: 114.00 cm/s MV E/A ratio:  0.73 Rudean Haskell MD Electronically signed by Rudean Haskell MD Signature Date/Time: 05/02/2022/10:29:43 AM    Final    DG Chest 2 View  Result Date: 05/01/2022 CLINICAL DATA:  Chest pain and shortness of breath. EXAM: CHEST - 2 VIEW COMPARISON:  June 01, 2021 FINDINGS: The heart size and mediastinal contours are within normal limits. There is moderate severity calcification of the aortic arch. Both lungs are clear. There is mild, stable elevation of the right hemidiaphragm. Radiopaque surgical clips are seen within the right upper quadrant. Multilevel degenerative changes seen throughout the thoracic spine. IMPRESSION: No active cardiopulmonary disease. Electronically Signed   By: Virgina Norfolk M.D.   On: 05/01/2022 20:22    Cardiac Studies   Echocardiogram 05/02/22   1. Left ventricular ejection fraction, by estimation, is 65 to 70%. Left  ventricular ejection fraction by 3D volume is 63 %. The left ventricle has  normal function. The left ventricle has no regional wall motion  abnormalities. There is moderate  asymmetric left ventricular hypertrophy of the basal-septal segment. Left  ventricular diastolic parameters are consistent with Grade I diastolic  dysfunction  (impaired relaxation).   2. Right ventricular systolic  function is normal. The right ventricular  size is normal. Tricuspid regurgitation signal is inadequate for assessing  PA pressure.   3. The mitral valve is normal in structure. No evidence of mitral valve  regurgitation. No evidence of mitral stenosis.   4. The aortic valve is tricuspid. There is mild thickening of the aortic  valve. Aortic valve regurgitation is not visualized. Aortic valve  sclerosis is present, with no evidence of aortic valve stenosis.   Comparison(s): No significant change from prior study.   Patient Profile     76 y.o. female with a past medical history of type 2 DM, CKD stage IIIa, hyperlipidemia, GERD, HTN, sleep apnea on CPAP who is being seen for the evaluation of chest pain.   Assessment & Plan    NSTEMI  - Patient presented complaining of mid sternal chest pain that occurred when she was arguing with her grandson  - hsTn [id number]>>482  - On IV heparin, planned to undergo LHC today. Has a documented history of nausea with contrast, however she does not remember. Was pre-treated in case  - Continue ASA 81 mg daily  - Continue statin  - Not on BB due to history of anaphylaxis on metoprolol  - Echocardiogram this admission with EF 65-70%, no regional wall motion abnormalities, moderate LVH, grade I diastolic dysfunction   HTN  - Continue home HCTZ 25 mg daily, altace 10 mg BID  - Consider resuming home diltiazem 240 mg daily   PVCs  - Continue monitoring on telemetry  - Maintain K>4, mag >2  - Not on BB due to history of anaphylaxis on metoprolol.  - Also has a documented intolerance to cardizem, but reports that diltiazem is OK and she has been on this for many years. Consider resuming home diltiazem to help suppress PVCs        For questions or updates, please contact [place] Please consult [email or web address] for contact info under        Signed, Margie Billet, PA-C  05/03/2022,  9:20 AM

## 2022-05-04 ENCOUNTER — Encounter (HOSPITAL_COMMUNITY): Payer: Self-pay | Admitting: Cardiovascular Disease

## 2022-05-04 ENCOUNTER — Inpatient Hospital Stay (HOSPITAL_BASED_OUTPATIENT_CLINIC_OR_DEPARTMENT_OTHER)
Admit: 2022-05-04 | Discharge: 2022-05-04 | Disposition: A | Discharge status: Home / Self Care | Payer: HMO | Attending: Cardiology | Admitting: Cardiology

## 2022-05-04 DIAGNOSIS — I214 Non-ST elevation (NSTEMI) myocardial infarction: Secondary | ICD-10-CM | POA: Diagnosis not present

## 2022-05-04 DIAGNOSIS — I493 Ventricular premature depolarization: Secondary | ICD-10-CM | POA: Diagnosis not present

## 2022-05-04 DIAGNOSIS — R9431 Abnormal electrocardiogram [ECG] [EKG]: Secondary | ICD-10-CM | POA: Diagnosis not present

## 2022-05-04 LAB — CBC
HCT: 36.7 % (ref 36.0–46.0)
Hemoglobin: 12.1 g/dL (ref 12.0–15.0)
MCH: 29.5 pg (ref 26.0–34.0)
MCHC: 33 g/dL (ref 30.0–36.0)
MCV: 89.5 fL (ref 80.0–100.0)
Platelets: 195 10*3/uL (ref 150–400)
RBC: 4.1 MIL/uL (ref 3.87–5.11)
RDW: 12.2 % (ref 11.5–15.5)
WBC: 6.3 10*3/uL (ref 4.0–10.5)
nRBC: 0 % (ref 0.0–0.2)

## 2022-05-04 LAB — BASIC METABOLIC PANEL
Anion gap: 8 (ref 5–15)
BUN: 18 mg/dL (ref 8–23)
CO2: 23 mmol/L (ref 22–32)
Calcium: 9 mg/dL (ref 8.9–10.3)
Chloride: 108 mmol/L (ref 98–111)
Creatinine, Ser: 1.01 mg/dL — ABNORMAL HIGH (ref 0.44–1.00)
GFR, Estimated: 58 mL/min — ABNORMAL LOW (ref >60)
Glucose, Bld: 198 mg/dL — ABNORMAL HIGH (ref 70–99)
Potassium: 3.7 mmol/L (ref 3.5–5.1)
Sodium: 139 mmol/L (ref 135–145)

## 2022-05-04 LAB — MAGNESIUM: Magnesium: 2 mg/dL (ref 1.7–2.4)

## 2022-05-04 LAB — GLUCOSE, CAPILLARY: Glucose-Capillary: 183 mg/dL — ABNORMAL HIGH (ref 70–99)

## 2022-05-04 MED ORDER — POTASSIUM CHLORIDE CRYS ER 20 MEQ PO TBCR
40.0000 meq | EXTENDED_RELEASE_TABLET | Freq: Once | ORAL | Status: AC
  Administered 2022-05-04: 40 meq via ORAL
  Filled 2022-05-04: qty 2

## 2022-05-04 MED ORDER — ATORVASTATIN CALCIUM 80 MG PO TABS: 80.0000 mg | ORAL_TABLET | Freq: Every day | ORAL | 11 refills | Status: DC

## 2022-05-04 MED ORDER — PANTOPRAZOLE SODIUM 40 MG PO TBEC: 40.0000 mg | DELAYED_RELEASE_TABLET | Freq: Every day | ORAL | 2 refills | Status: AC

## 2022-05-04 MED ORDER — ALUM & MAG HYDROXIDE-SIMETH 200-200-20 MG/5ML PO SUSP
30.0000 mL | ORAL | Status: DC | PRN
  Administered 2022-05-04: 30 mL via ORAL
  Filled 2022-05-04: qty 30

## 2022-05-04 MED ORDER — CLOPIDOGREL BISULFATE 75 MG PO TABS: 75.0000 mg | ORAL_TABLET | Freq: Every day | ORAL | 2 refills | Status: DC

## 2022-05-04 MED FILL — Nitroglycerin IV Soln 100 MCG/ML in D5W: INTRA_ARTERIAL | Qty: 10 | Status: AC

## 2022-05-04 NOTE — Progress Notes (Signed)
Explained discharge instructions to the patient. Reviewed follow up appointment schedule, next medication administration times, Zio Patch monitoring post discharge care.  Patient verbalized having understanding. Removed PIV and telemetry monitor. CCMD was notified. All personal belongings were in the patient's possession at the time of transport down to the discharge lounge.

## 2022-05-04 NOTE — Progress Notes (Signed)
Pt had short episode of 2nd degree type I and II on telemetry.  Strip saved; not sustained to capture on EKG.  Pt asymptomatic; VSS.  Pt states she has sleep apnea and will pick up cpap at discharge for home use.  Pt states at home she gets dizzy sitting in her chair at times and heart rate is in the 30s.  Pt also states she was sent to a doctor in recent past for a consult regarding a pacemaker.  K 3.7; Mg 2.0.  Dr. Kalman Shan notified via [facility] of rhythm changes noted.

## 2022-05-04 NOTE — Care Management Important Message (Signed)
Important Message  Patient Details  Name: Alice Cole MRN: [id number] Date of Birth: September 22, 1946   Medicare Important Message Given:  Yes     Shelda Altes 05/04/2022, 12:05 PM

## 2022-05-04 NOTE — Progress Notes (Signed)
Progress Note  Patient Name: Alice Cole Date of Encounter: 05/04/2022  [facility] Cardiologist: Fransico Him, MD   Subjective  95% ostial RCA and 50% proximal left circumflex status post PCI of the RCA.  RFR of the proximal left circumflex was normal at 0.96.  There was diffuse mild plaquing of the LAD. Cath yesterday demonstrated 95% ostial RCA and 50% proximal left circumflex status post PCI of the RCA.  RFR of the proximal left circumflex was normal at 0.96.  There was diffuse mild plaquing of the LAD.  This morning chest discomfort while eating.  She tells me that she choked on her food and had some pain in her midsternal area radiating into her back but was short-lived.  No other episodes of chest pain.  She was noted to have some intermittent second-degree type I Wenckebach block on telemetry during sleep last night and this morning.  She is completely asymptomatic.  Inpatient Medications    Scheduled Meds:  aspirin EC  81 mg Oral Daily   clopidogrel  75 mg Oral Q breakfast   insulin aspart  0-20 Units Subcutaneous TID WC   insulin aspart  0-5 Units Subcutaneous QHS   insulin glargine-yfgn  24 Units Subcutaneous Daily   pantoprazole  40 mg Oral Daily   pravastatin  40 mg Oral QHS   sodium chloride flush  3 mL Intravenous Q12H   sodium chloride flush  3 mL Intravenous Q12H   Continuous Infusions:  sodium chloride     PRN Meds: sodium chloride, acetaminophen, ALPRAZolam, alum & mag hydroxide-simeth, fentaNYL (SUBLIMAZE) injection, hydrALAZINE, melatonin, nitroGLYCERIN, ondansetron (ZOFRAN) IV, sodium chloride flush   Vital Signs    Vitals:   05/03/22 2036 05/04/22 0017 05/04/22 0401 05/04/22 0731  BP: (!) 149/61 139/69 (!) 140/49 (!) 146/60  Pulse: 69 60 62 60  Resp: '15 14 15 18  '$ Temp: 97.9 F (36.6 C) 98.3 F (36.8 C) 98.4 F (36.9 C) 97.7 F (36.5 C)  TempSrc: Oral Oral Oral Axillary  SpO2: 95% 92% 96% 96%  Weight:   95.8 kg   Height:         Intake/Output Summary (Last 24 hours) at 05/04/2022 1102 Last data filed at 05/04/2022 0300 Gross per 24 hour  Intake 1786.51 ml  Output --  Net 1786.51 ml      05/04/2022    4:01 AM 05/03/2022    5:00 AM 05/02/2022    2:11 PM  Last 3 Weights  Weight (lbs) 211 lb 3.2 oz 210 lb 15.7 oz 208 lb 8 oz  Weight (kg) 95.8 kg 95.7 kg 94.575 kg      Telemetry    Normal sinus rhythm with intermittent second-degree type I Wenke block block- Personally Reviewed  ECG    Normal sinus rhythm.  Prolonged QT was read out on EKG but there is a prominent U wave.  QTc on telemetry is less than 400 ms- Personally Reviewed  Physical Exam   GEN: Well nourished, well developed in no acute distress HEENT: Normal NECK: No JVD; No carotid bruits LYMPHATICS: No lymphadenopathy CARDIAC:RRR, no murmurs, rubs, gallops RESPIRATORY:  Clear to auscultation without rales, wheezing or rhonchi  ABDOMEN: Soft, non-tender, non-distended MUSCULOSKELETAL:  No edema; No deformity  SKIN: Warm and dry.  Stable right radial cath site with no hematoma or ecchymosis NEUROLOGIC:  Alert and oriented x 3 PSYCHIATRIC:  Normal affect   Labs    High Sensitivity Troponin:   Recent Labs  Lab 05/01/22 1945  05/01/22 2141 05/02/22 0732 05/02/22 1044  TROPONINIHS 43* 323* 565* 482*      Chemistry Recent Labs  Lab 05/01/22 1945 05/03/22 0506 05/04/22 0150  NA 139 134* 139  K 3.5 3.8 3.7  CL 107 101 108  CO2 '23 25 23  '$ GLUCOSE 200* 246* 198*  BUN '17 18 18  '$ CREATININE 1.16* 1.26* 1.01*  CALCIUM 9.3 9.5 9.0  MG 1.8 2.1 2.0  PROT 6.4*  --   --   ALBUMIN 3.6  --   --   AST 30  --   --   ALT 31  --   --   ALKPHOS 76  --   --   BILITOT 0.7  --   --   GFRNONAA 49* 45* 58*  ANIONGAP '9 8 8     '$ Lipids  Recent Labs  Lab 05/02/22 0351  CHOL 166  TRIG 185*  HDL 37*  LDLCALC 92  CHOLHDL 4.5     Hematology Recent Labs  Lab 05/02/22 0351 05/03/22 0506 05/04/22 0150  WBC 8.0 9.6 6.3  RBC 4.54 4.39 4.10   HGB 13.3 13.1 12.1  HCT 40.2 38.0 36.7  MCV 88.5 86.6 89.5  MCH 29.3 29.8 29.5  MCHC 33.1 34.5 33.0  RDW 12.2 11.9 12.2  PLT 225 213 195    Thyroid No results for input(s): "TSH", "FREET4" in the last 168 hours.  BNPNo results for input(s): "BNP", "PROBNP" in the last 168 hours.  DDimer No results for input(s): "DDIMER" in the last 168 hours.   Radiology    CARDIAC CATHETERIZATION  Result Date: 05/03/2022   Ost RCA to Prox RCA lesion is 95% stenosed.   Prox Cx lesion is 50% stenosed.   A drug-eluting stent was successfully placed using a SYNERGY XD 3.0X16.   Post intervention, there is a 10% residual stenosis. 1.  Severe ostial RCA stenosis (RFR equals 0.36) treated successfully with high-pressure balloon angioplasty and stenting using a 3.0 x 16 mm Synergy DES 2.  Moderate proximal left circumflex stenosis with negative RFR evaluation of 0.96 3.  Widely patent left main 4.  Patent LAD with mild diffuse calcific plaquing Recommendations: Aspirin and clopidogrel 12 months without interruption, aggressive risk reduction measures    Cardiac Studies   Echocardiogram 05/02/22   1. Left ventricular ejection fraction, by estimation, is 65 to 70%. Left  ventricular ejection fraction by 3D volume is 63 %. The left ventricle has  normal function. The left ventricle has no regional wall motion  abnormalities. There is moderate  asymmetric left ventricular hypertrophy of the basal-septal segment. Left  ventricular diastolic parameters are consistent with Grade I diastolic  dysfunction (impaired relaxation).   2. Right ventricular systolic function is normal. The right ventricular  size is normal. Tricuspid regurgitation signal is inadequate for assessing  PA pressure.   3. The mitral valve is normal in structure. No evidence of mitral valve  regurgitation. No evidence of mitral stenosis.   4. The aortic valve is tricuspid. There is mild thickening of the aortic  valve. Aortic valve  regurgitation is not visualized. Aortic valve  sclerosis is present, with no evidence of aortic valve stenosis.   Comparison(s): No significant change from prior study.   Patient Profile     76 y.o. female with a past medical history of type 2 DM, CKD stage IIIa, hyperlipidemia, GERD, HTN, sleep apnea on CPAP who is being seen for the evaluation of chest pain.   Assessment & Plan  NSTEMI  - Patient presented complaining of mid sternal chest pain that occurred when she was arguing with her grandson  - hsTn [phone]  - Cardiac cath showed 95% ostial RCA and 50% proximal left circumflex status post PCI of the RCA.  RFR of the proximal left circumflex was normal at 0.96.  There was diffuse mild plaquing of the LAD. - Continue ASA 81 mg daily2 - Her LDL is not at goal so we will change pravastatin to atorvastatin 80 mg daily -She will need repeat FLP and ALT in 6 weeks - Not on BB due to history of anaphylaxis on metoprolol  - Echocardiogram this admission with EF 65-70%, no regional wall motion abnormalities, moderate LVH, grade I diastolic dysfunction   HTN  -BP is adequately controlled on exam  -continue home medications including HCTZ 25 mg daily, Altace 10 mg twice daily -Stopping diltiazem due to episodic second-degree type I AV block -We will have patient check blood pressure twice daily for a week and call with results to the office  PVCs  - Continue monitoring on telemetry  - Maintain K>4, mag >2  - Not on BB due to history of anaphylaxis on metoprolol.   Hyperlipidemia -LDL is not at goal of less than 70 -Stop pravastatin -start atorvastatin 80 mg daily -Check FLP and ALT in 6 weeks  Intermittent second-degree type I AV block -This occurred sleeping with no higher grade AV block -She does have a history of obstructive sleep apnea and has not been using CPAP in the hospital -Will get a 2-week live Zio patch to assess for any higher AV block -We will stop diltiazem  for now  [place] will sign off.   Medication Recommendations: HCTZ 25 mg daily, Altace 10 mg twice daily, atorvastatin 80 mg daily, aspirin 81 mg daily, Plavix 75 mg daily Other recommendations (labs, testing, etc): ALT and FLP in 6 weeks, blood pressure check daily for a week and call office with results, 2-week live Zio patch Follow up as an outpatient: Richardson Dopp, PA on 06/02/22     For questions or updates, please contact [place] Please consult [email or web address] for contact info under        Signed, Fransico Him, MD  05/04/2022, 11:02 AM

## 2022-05-04 NOTE — Progress Notes (Signed)
Pt  c/o indigestion; no telemetry abnormalities noted with indigestion. Maalox given per prn orders with resolution of indigestion.  EKG obtained with QTC 557.  Dr. Kalman Shan notified of patient c/o and prolonged QTC .

## 2022-05-04 NOTE — Progress Notes (Signed)
CARDIAC REHAB PHASE I   PRE:  Rate/Rhythm: 59 SR  BP:  Sitting: 122/95      SaO2: 96 RA  MODE:  Ambulation: 450 ft   POST:  Rate/Rhythm: 92 SR  BP:  Sitting: 158/66    SaO2: 97 RA  Pt ambulated 432f in hallway standby assist with steady gait. Pt denies CP, SOB, or dizziness throughout. Pt assisted to BR than returned to EOB. Pt educated on importance of ASA and Plavix. Pt given stent card along with heart healthy and diabetic diets. Reviewed site care, restrictions, and exercise guidelines. Will refer to CRP II [place].  [id number] Falco RN BSN 05/04/2022 10:43 AM

## 2022-05-04 NOTE — Discharge Instructions (Signed)
Information about your medication: Plavix (anti-platelet agent)  Generic Name (Brand): clopidogrel (Plavix), once daily medication  PURPOSE: You are taking this medication along with aspirin to lower your chance of having a heart attack, stroke, or blood clots in your heart stent. These can be fatal. Plavix and aspirin help prevent platelets from sticking together and forming a clot that can block an artery or your stent.   Common SIDE EFFECTS you may experience include: bruising or bleeding more easily, shortness of breath  Do not stop taking PLAVIX without talking to the doctor who prescribes it for you. People who are treated with a stent and stop taking Plavix too soon, have a higher risk of getting a blood clot in the stent, having a heart attack, or dying. If you stop Plavix because of bleeding, or for other reasons, your risk of a heart attack or stroke may increase.   Avoid taking NSAID agents or anti-inflammatory medications such as ibuprofen, naproxen given increased bleed risk with plavix - can use acetaminophen (Tylenol) if needed for pain.  Avoid taking stomach medications omeprazole (Prilosec) or esomeprazole (Nexium) since these do interact and make plavix less effective - ask your pharmacist or doctor for alterative agents if needed for heartburn or GERD. Pantoprazole is an appropriate alternative and within the same class of drugs.   Tell all of your doctors and dentists that you are taking Plavix. They should talk to the doctor who prescribed Plavix for you before you have any surgery or invasive procedure.   Contact your health care provider if you experience: severe or uncontrollable bleeding, pink/red/brown urine, vomiting blood or vomit that looks like "coffee grounds", red or black stools (looks like tar), coughing up blood or blood clots ----------------------------------------------------------------------------------------------------------------------

## 2022-05-04 NOTE — Progress Notes (Signed)
Notified by nurse with update of overnight events: had indigestion improved with Maalox, EKG showing NSR with potential QTc prolongation called to the fellow. EKG shows NSR with nonspecific STTW changes - QTC reading out as 580m but very low T wave amplitude and hand calculation comes out to 4949m She had another recurrent episode of discomfort while eating breakfast this AM accompanied by some potential dysphagia that has since resolved, EKG similar to previous. There was also intermittent 2nd degree heart block type 1 and 2 noted overnight per nursing staff on telemetry. Will replete K (3.7 -> 4071m, hold diltiazem, and ask rounding team to prioritize first in rounds shortly.

## 2022-05-04 NOTE — Progress Notes (Signed)
Per Dr. Theodosia Blender request, I have ordered a 2 week live zio monitor to be placed prior to discharge. Monitor to be read by Dr. Radford Pax. Patient has a follow up appointment on 8/23 with general cardiology. Also will need a fasting lipid panel and LFTs in 6 weeks.   Margie Billet, PA-C 05/04/2022 9:08 AM

## 2022-05-04 NOTE — Discharge Summary (Signed)
Physician Discharge Summary  Alice Cole YJE:[id number] DOB: 1946-04-08 DOA: 05/01/2022  PCP: [facility]  Admit date: 05/01/2022 Discharge date: 05/04/2022  Admitted From: Home Disposition: Home  Recommendations for Outpatient Follow-up:  Follow up with PCP in 1-2 weeks Cardiology will schedule follow-up. Zio patch monitoring as outpatient.  Home Health: None Equipment/Devices: Zio patch  Discharge Condition: Stable CODE STATUS: Full code Diet recommendation: Low-salt and low-carb diet  Discharge summary:  76 year old female with history of type 2 diabetes, CKD stage IIIa, hyperlipidemia, GERD, hypertension, sleep apnea came to emergency room with substernal chest pain.  She was ruled in for non-STEMI, started on heparin infusion and admitted to the hospital.  2D echocardiogram with normal ejection fraction and no wall motion abnormalities. Underwent left heart cath on 7/24 with PCI to RCA.  Diffuse mild plaquing of the LAD.  Patient was monitored in the hospital after stenting overnight.  Patient had intermittent second-degree type I block on the telemetry during sleep at night that was asymptomatic.  Non-STEMI: Status with PCI.  Dual antiplatelet therapy.  LDL not at goal, pravastatin changed to atorvastatin 80 mg daily.  History of anaphylaxis on metoprolol, beta-blockers contraindicated. Blood pressures are stable on hydrochlorothiazide and Altace. Noted to have incidental findings of intermittent second-degree type I AV block, Cardizem was stopped and patient was fitted with Zio patch for longitudinal monitoring.  Stable for discharge.  Case discussed with cardiology, they will schedule outpatient follow-up. Patient was called and instructed about recommendations and during stopping diltiazem and they acknowledge.    Discharge Diagnoses:  Principal Problem:   NSTEMI (non-ST elevated myocardial infarction) ([place]) Active Problems:   Type 2 diabetes  mellitus with chronic kidney disease, with long-term current use of insulin (HCC)   Mixed hyperlipidemia   GERD   Essential hypertension, benign   OSA (obstructive sleep apnea)   Chronic kidney disease, stage 3a [facility])    Discharge Instructions  Discharge Instructions     Amb Referral to Cardiac Rehabilitation   Complete by: As directed    Diagnosis:  Coronary Stents NSTEMI     After initial evaluation and assessments completed: Virtual Based Care may be provided alone or in conjunction with Phase 2 Cardiac Rehab based on patient barriers.: Yes   Diet - low sodium heart healthy   Complete by: As directed    Diet Carb Modified   Complete by: As directed    Increase activity slowly   Complete by: As directed       Allergies as of 05/04/2022       Reactions   Metoprolol Anaphylaxis   Nortriptyline Anaphylaxis   Other reaction(s): nightmares   Olmesartan Anaphylaxis   Other reaction(s): difficult urination, nausea   Tradjenta [linagliptin] Shortness Of Breath   Alatrofloxacin Dermatitis   Beta Adrenergic Blockers Other (See Comments)   Reaction: Low Heart Rate   Cardizem [diltiazem] Itching   Other reaction(s): sick  CANNOT TAKE CARDIZEM, DILTIAZEM OK   Cephalexin Diarrhea, Nausea And Vomiting   Codeine Other (See Comments)   tachycardia Other reaction(s): Palpitations   Colesevelam Hcl Nausea And Vomiting   Other reaction(s): sick to stomach   Crestor [rosuvastatin Calcium] Other (See Comments)   Leg cramps   Flagyl [metronidazole] Nausea Only   Fluvastatin Sodium Itching   Levemir [insulin Detemir] Itching, Other (See Comments)   Bad mood   Lisinopril Nausea Only   Penicillin G Itching   Other reaction(s): itching   Rosuvastatin Other (See Comments)  REACTION: cramps Other reaction(s): leg cramps   Sulfamethoxazole-trimethoprim Nausea Only, Other (See Comments)   REACTION: nausea, irreg. heartrate   Verapamil Swelling, Other (See Comments)   Chest pain    Adhesive [tape] Rash   Band Aids   Sitagliptin Rash   Trovan [trovafloxacin] Rash, Other (See Comments)        Medication List     STOP taking these medications    diltiazem 240 MG 24 hr capsule Commonly known as: CARDIZEM CD   omeprazole 20 MG capsule Commonly known as: PRILOSEC Replaced by: pantoprazole 40 MG tablet   pravastatin 40 MG tablet Commonly known as: PRAVACHOL       TAKE these medications    acetaminophen 500 MG tablet Commonly known as: TYLENOL Take 1,000 mg by mouth every 6 (six) hours as needed for moderate pain or headache.   alendronate 70 MG tablet Commonly known as: FOSAMAX Take 70 mg by mouth once a week.   ALPRAZolam 0.25 MG tablet Commonly known as: XANAX Take 0.25 mg by mouth daily as needed for anxiety.   aspirin 81 MG tablet Take 81 mg by mouth daily.   atorvastatin 80 MG tablet Commonly known as: Lipitor Take 1 tablet (80 mg total) by mouth daily.   BD Pen Needle Nano U/F 32G X 4 MM Misc Generic drug: Insulin Pen Needle   cholecalciferol 25 MCG (1000 UNIT) tablet Commonly known as: VITAMIN D3 Take 1,000 Units by mouth daily.   clopidogrel 75 MG tablet Commonly known as: PLAVIX Take 1 tablet (75 mg total) by mouth daily with breakfast. Start taking on: May 05, 2022   hydrochlorothiazide 25 MG tablet Commonly known as: HYDRODIURIL Take 25 mg by mouth daily.   Lantus SoloStar 100 UNIT/ML Solostar Pen Generic drug: insulin glargine Inject 24 Units into the skin daily.   NovoLOG FlexPen 100 UNIT/ML FlexPen Generic drug: insulin aspart Inject 38-50 Units into the skin 3 (three) times daily with meals. Take 40 units in the morning, Take 38 units at lunch, Take 50 units in the evening.   pantoprazole 40 MG tablet Commonly known as: PROTONIX Take 1 tablet (40 mg total) by mouth daily. Start taking on: May 05, 2022 Replaces: omeprazole 20 MG capsule   ramipril 10 MG capsule Commonly known as: ALTACE Take 1 capsule by  mouth 2 (two) times daily.        Allergies  Allergen Reactions   Metoprolol Anaphylaxis   Nortriptyline Anaphylaxis    Other reaction(s): nightmares   Olmesartan Anaphylaxis    Other reaction(s): difficult urination, nausea   Tradjenta [Linagliptin] Shortness Of Breath   Alatrofloxacin Dermatitis   Beta Adrenergic Blockers Other (See Comments)    Reaction: Low Heart Rate   Cardizem [Diltiazem] Itching    Other reaction(s): sick  CANNOT TAKE CARDIZEM, DILTIAZEM OK   Cephalexin Diarrhea and Nausea And Vomiting   Codeine Other (See Comments)    tachycardia Other reaction(s): Palpitations   Colesevelam Hcl Nausea And Vomiting    Other reaction(s): sick to stomach   Crestor [Rosuvastatin Calcium] Other (See Comments)    Leg cramps   Flagyl [Metronidazole] Nausea Only   Fluvastatin Sodium Itching   Levemir [Insulin Detemir] Itching and Other (See Comments)    Bad mood   Lisinopril Nausea Only   Penicillin G Itching    Other reaction(s): itching   Rosuvastatin Other (See Comments)    REACTION: cramps Other reaction(s): leg cramps   Sulfamethoxazole-Trimethoprim Nausea Only and Other (See Comments)  REACTION: nausea, irreg. heartrate   Verapamil Swelling and Other (See Comments)    Chest pain   Adhesive [Tape] Rash    Band Aids   Sitagliptin Rash   Trovan [Trovafloxacin] Rash and Other (See Comments)    Consultations: Cardiology    Procedures/Studies: CARDIAC CATHETERIZATION  Result Date: 05/03/2022   Ost RCA to Prox RCA lesion is 95% stenosed.   Prox Cx lesion is 50% stenosed.   A drug-eluting stent was successfully placed using a SYNERGY XD 3.0X16.   Post intervention, there is a 10% residual stenosis. 1.  Severe ostial RCA stenosis (RFR equals 0.36) treated successfully with high-pressure balloon angioplasty and stenting using a 3.0 x 16 mm Synergy DES 2.  Moderate proximal left circumflex stenosis with negative RFR evaluation of 0.96 3.  Widely patent left main  4.  Patent LAD with mild diffuse calcific plaquing Recommendations: Aspirin and clopidogrel 12 months without interruption, aggressive risk reduction measures   ECHOCARDIOGRAM COMPLETE  Result Date: 05/02/2022    ECHOCARDIOGRAM REPORT   Patient Name:   Alice Cole Date of Exam: 05/02/2022 Medical Rec #:  [id number]   Height:       67.0 in Accession #:    [id number]  Weight:       209.0 lb Date of Birth:  November 03, 1945   BSA:          2.060 m Patient Age:    76 years    BP:           105/88 mmHg Patient Gender: F           HR:           87 bpm. Exam Location:  Inpatient Procedure: 2D Echo, 3D Echo, Cardiac Doppler and Color Doppler Indications:    Chest pain  History:        Patient has prior history of Echocardiogram examinations, most                 recent 04/01/2021. Abnormal ECG, Arrythmias:PVC and Bigeminy,                 Signs/Symptoms:Shortness of Breath and Chest Pain; Risk                 Factors:Sleep Apnea and Hypertension. Elevated troponin.  Sonographer:    Merrie Roof RDCS Referring Phys: [place]  1. Left ventricular ejection fraction, by estimation, is 65 to 70%. Left ventricular ejection fraction by 3D volume is 63 %. The left ventricle has normal function. The left ventricle has no regional wall motion abnormalities. There is moderate asymmetric left ventricular hypertrophy of the basal-septal segment. Left ventricular diastolic parameters are consistent with Grade I diastolic dysfunction (impaired relaxation).  2. Right ventricular systolic function is normal. The right ventricular size is normal. Tricuspid regurgitation signal is inadequate for assessing PA pressure.  3. The mitral valve is normal in structure. No evidence of mitral valve regurgitation. No evidence of mitral stenosis.  4. The aortic valve is tricuspid. There is mild thickening of the aortic valve. Aortic valve regurgitation is not visualized. Aortic valve sclerosis is present, with no evidence of aortic valve  stenosis. Comparison(s): No significant change from prior study. FINDINGS  Left Ventricle: Left ventricular ejection fraction, by estimation, is 65 to 70%. Left ventricular ejection fraction by 3D volume is 63 %. The left ventricle has normal function. The left ventricle has no regional wall motion abnormalities. The left ventricular internal cavity size was normal  in size. There is moderate asymmetric left ventricular hypertrophy of the basal-septal segment. Left ventricular diastolic parameters are consistent with Grade I diastolic dysfunction (impaired relaxation). Right Ventricle: The right ventricular size is normal. No increase in right ventricular wall thickness. Right ventricular systolic function is normal. Tricuspid regurgitation signal is inadequate for assessing PA pressure. Left Atrium: Left atrial size was normal in size. Right Atrium: Right atrial size was normal in size. Pericardium: There is no evidence of pericardial effusion. Presence of epicardial fat layer. Mitral Valve: The mitral valve is normal in structure. No evidence of mitral valve regurgitation. No evidence of mitral valve stenosis. Tricuspid Valve: The tricuspid valve is normal in structure. Tricuspid valve regurgitation is not demonstrated. No evidence of tricuspid stenosis. Aortic Valve: The aortic valve is tricuspid. There is mild thickening of the aortic valve. Aortic valve regurgitation is not visualized. Aortic valve sclerosis is present, with no evidence of aortic valve stenosis. Pulmonic Valve: The pulmonic valve was normal in structure. Pulmonic valve regurgitation is trivial. No evidence of pulmonic stenosis. Aorta: The aortic root is normal in size and structure. IAS/Shunts: No atrial level shunt detected by color flow Doppler.  LEFT VENTRICLE PLAX 2D LVIDd:         3.90 cm         Diastology LVIDs:         2.30 cm         LV e' medial:    8.05 cm/s LV PW:         1.20 cm         LV E/e' medial:  10.3 LV IVS:        1.10 cm          LV e' lateral:   8.49 cm/s LVOT diam:     2.10 cm         LV E/e' lateral: 9.8 LV SV:         86 LV SV Index:   42 LVOT Area:     3.46 cm        3D Volume EF                                LV 3D EF:    Left                                             ventricul                                             ar                                             ejection                                             fraction  by 3D                                             volume is                                             63 %.                                 3D Volume EF:                                3D EF:        63 %                                LV EDV:       106 ml                                LV ESV:       39 ml                                LV SV:        67 ml RIGHT VENTRICLE RV Basal diam:  2.80 cm RV S prime:     9.90 cm/s TAPSE (M-mode): 2.5 cm LEFT ATRIUM             Index        RIGHT ATRIUM           Index LA diam:        2.90 cm 1.41 cm/m   RA Area:     14.10 cm LA Vol (A2C):   39.0 ml 18.93 ml/m  RA Volume:   30.60 ml  14.85 ml/m LA Vol (A4C):   24.9 ml 12.08 ml/m LA Biplane Vol: 31.5 ml 15.29 ml/m  AORTIC VALVE LVOT Vmax:   132.00 cm/s LVOT Vmean:  76.200 cm/s LVOT VTI:    0.247 m  AORTA Ao Root diam: 3.50 cm Ao Asc diam:  3.00 cm MITRAL VALVE MV Area (PHT): 2.64 cm     SHUNTS MV Decel Time: 287 msec     Systemic VTI:  0.25 m MV E velocity: 83.20 cm/s   Systemic Diam: 2.10 cm MV A velocity: 114.00 cm/s MV E/A ratio:  0.73 Rudean Haskell MD Electronically signed by Rudean Haskell MD Signature Date/Time: 05/02/2022/10:29:43 AM    Final    DG Chest 2 View  Result Date: 05/01/2022 CLINICAL DATA:  Chest pain and shortness of breath. EXAM: CHEST - 2 VIEW COMPARISON:  June 01, 2021 FINDINGS: The heart size and mediastinal contours are within normal limits. There is moderate severity calcification of the aortic arch. Both lungs are clear.  There is mild, stable elevation of the right hemidiaphragm. Radiopaque surgical clips are seen within the right upper quadrant. Multilevel degenerative changes seen throughout the thoracic spine. IMPRESSION: No active cardiopulmonary disease. Electronically Signed   By: Hoover Browns  [place] M.D.   On: 05/01/2022 20:22   (Echo, Carotid, EGD, Colonoscopy, ERCP)    Subjective: Patient seen in the morning rounds.  She had an episode of epigastric discomfort that improved after drinking water.  No other overnight events.  Telemetry monitor with variable second-degree type I heart block.  Without any dizziness lightheadedness.  Eager to go home.   Discharge Exam: Vitals:   05/04/22 0401 05/04/22 0731  BP: (!) 140/49 (!) 146/60  Pulse: 62 60  Resp: 15 18  Temp: 98.4 F (36.9 C) 97.7 F (36.5 C)  SpO2: 96% 96%   Vitals:   05/03/22 2036 05/04/22 0017 05/04/22 0401 05/04/22 0731  BP: (!) 149/61 139/69 (!) 140/49 (!) 146/60  Pulse: 69 60 62 60  Resp: '15 14 15 18  '$ Temp: 97.9 F (36.6 C) 98.3 F (36.8 C) 98.4 F (36.9 C) 97.7 F (36.5 C)  TempSrc: Oral Oral Oral Axillary  SpO2: 95% 92% 96% 96%  Weight:   95.8 kg   Height:        General: Pt is alert, awake, not in acute distress Cardiovascular: RRR, S1/S2 +, no rubs, no gallops Respiratory: CTA bilaterally, no wheezing, no rhonchi Abdominal: Soft, NT, ND, bowel sounds + Extremities: no edema, no cyanosis    The results of significant diagnostics from this hospitalization (including imaging, microbiology, ancillary and laboratory) are listed below for reference.     Microbiology: No results found for this or any previous visit (from the past 240 hour(s)).   Labs: BNP (last 3 results) No results for input(s): "BNP" in the last 8760 hours. Basic Metabolic Panel: Recent Labs  Lab 05/01/22 1945 05/03/22 0506 05/04/22 0150  NA 139 134* 139  K 3.5 3.8 3.7  CL 107 101 108  CO2 '23 25 23  '$ GLUCOSE 200* 246* 198*  BUN '17 18 18   '$ CREATININE 1.16* 1.26* 1.01*  CALCIUM 9.3 9.5 9.0  MG 1.8 2.1 2.0   Liver Function Tests: Recent Labs  Lab 05/01/22 1945  AST 30  ALT 31  ALKPHOS 76  BILITOT 0.7  PROT 6.4*  ALBUMIN 3.6   No results for input(s): "LIPASE", "AMYLASE" in the last 168 hours. No results for input(s): "AMMONIA" in the last 168 hours. CBC: Recent Labs  Lab 05/01/22 1945 05/02/22 0351 05/03/22 0506 05/04/22 0150  WBC 7.8 8.0 9.6 6.3  NEUTROABS 4.2  --   --   --   HGB 13.0 13.3 13.1 12.1  HCT 38.3 40.2 38.0 36.7  MCV 88.0 88.5 86.6 89.5  PLT 198 225 213 195   Cardiac Enzymes: No results for input(s): "CKTOTAL", "CKMB", "CKMBINDEX", "TROPONINI" in the last 168 hours. BNP: Invalid input(s): "POCBNP" CBG: Recent Labs  Lab 05/03/22 0601 05/03/22 1140 05/03/22 1713 05/03/22 2040 05/04/22 0733  GLUCAP 215* 147* 169* 233* 183*   D-Dimer No results for input(s): "DDIMER" in the last 72 hours. Hgb A1c Recent Labs    05/02/22 0351  HGBA1C 7.4*   Lipid Profile Recent Labs    05/02/22 0351  CHOL 166  HDL 37*  LDLCALC 92  TRIG 185*  CHOLHDL 4.5   Thyroid function studies No results for input(s): "TSH", "T4TOTAL", "T3FREE", "THYROIDAB" in the last 72 hours.  Invalid input(s): "FREET3" Anemia work up No results for input(s): "VITAMINB12", "FOLATE", "FERRITIN", "TIBC", "IRON", "RETICCTPCT" in the last 72 hours. Urinalysis    Component Value Date/Time   COLORURINE YELLOW 02/20/2014 1201   APPEARANCEUR CLEAR 02/20/2014 1201   LABSPEC 1.006 02/20/2014 1201  PHURINE 6.0 02/20/2014 1201   GLUCOSEU NEGATIVE 02/20/2014 1201   [place] 02/20/2014 [place] 02/20/2014 1201   KETONESUR NEGATIVE 02/20/2014 1201   PROTEINUR NEGATIVE 02/20/2014 1201   UROBILINOGEN 0.2 02/20/2014 1201   NITRITE NEGATIVE 02/20/2014 1201   LEUKOCYTESUR SMALL (A) 02/20/2014 1201   Sepsis Labs Recent Labs  Lab 05/01/22 1945 05/02/22 0351 05/03/22 0506 05/04/22 0150  WBC 7.8  8.0 9.6 6.3   Microbiology No results found for this or any previous visit (from the past 240 hour(s)).   Time coordinating discharge: 35 minutes  SIGNED:   Barb Merino, MD  [facility] 05/04/2022, 11:23 AM

## 2022-05-05 ENCOUNTER — Telehealth: Payer: Self-pay | Admitting: Cardiology

## 2022-05-05 DIAGNOSIS — I493 Ventricular premature depolarization: Secondary | ICD-10-CM | POA: Diagnosis not present

## 2022-05-05 DIAGNOSIS — R9431 Abnormal electrocardiogram [ECG] [EKG]: Secondary | ICD-10-CM | POA: Diagnosis not present

## 2022-05-05 NOTE — Telephone Encounter (Signed)
Patient calling in bout the bandage she has, unsure of when she can take it off. Please advise

## 2022-05-05 NOTE — Telephone Encounter (Signed)
Spoke with the patient and advised that she could take her bandage off 24 hours after procedure. Advised to make sure that the site stays clean. Patient verbalized understanding.

## 2022-05-10 DIAGNOSIS — I252 Old myocardial infarction: Secondary | ICD-10-CM | POA: Diagnosis not present

## 2022-05-11 ENCOUNTER — Telehealth: Payer: Self-pay | Admitting: Cardiology

## 2022-05-11 NOTE — Telephone Encounter (Signed)
Left message for patient to call back  

## 2022-05-11 NOTE — Telephone Encounter (Signed)
Pt was told to call back after being taken off of bp medication for a week to give bp readings:  174/80  84 - Started on 07/25 173/81  77 184/79  90 145/78  95 171/75  93 156/71  83 137/59  82 174/73  108 179/89  96 155/86  92 161/89  95 149/70  99 138/71  89 184/85  94  163/81  90 138/65  98 - At PCP appt 07/31 189/84  84 - This morning

## 2022-05-14 ENCOUNTER — Telehealth: Payer: Self-pay | Admitting: *Deleted

## 2022-05-14 DIAGNOSIS — I10 Essential (primary) hypertension: Secondary | ICD-10-CM

## 2022-05-14 DIAGNOSIS — G4733 Obstructive sleep apnea (adult) (pediatric): Secondary | ICD-10-CM

## 2022-05-14 NOTE — Telephone Encounter (Signed)
Per Dr Radford Pax, order an [facility].  Prior Authorization for itamar sent to [facility] via web portal. Tracking Number-[id number]-       NO PA REQ.Marland Kitchen

## 2022-05-14 NOTE — Telephone Encounter (Signed)
CC'd Chart Routing History  Routing History  From: Sueanne Margarita, MD On: 04/06/2022 01:57 PM  To: Cv Div Sleep Studies (Pool)  Priority: Routine  Routing Comments:  Please let patient know that they have sleep apnea and recommend treating with CPAP.  Please order an auto CPAP from 4-15cm H2O with heated humidity and mask of choice.   Followup with me in 6 weeks.

## 2022-05-14 NOTE — Telephone Encounter (Signed)
The patient has been notified of the result and verbalized understanding.  All questions (if any) were answered. Marolyn Hammock, [place] 05/14/2022 12:05 PM    Upon patient request DME selection is [facility]. Patient understands he will be contacted by [facility] to set up his cpap. Patient understands to call if [facility] does not contact him with new setup in a timely manner. Patient understands they will be called once confirmation has been received from [facility]/ that they have received their new machine to schedule 10 week follow up appointment.   [facility] notified of new cpap order  Please add to airview Patient was grateful for the call and thanked me.

## 2022-05-17 MED ORDER — AMLODIPINE BESYLATE 5 MG PO TABS: 5.0000 mg | ORAL_TABLET | Freq: Every day | ORAL | 3 refills | Status: DC

## 2022-05-17 NOTE — Telephone Encounter (Signed)
Spoke with the patient and advised her to start taking amlodipine 5 mg per day. Prescription has been sent in. She will call next week with updated BP readings.

## 2022-05-17 NOTE — Telephone Encounter (Signed)
Patient is returning call. Please advise? 

## 2022-05-17 NOTE — Telephone Encounter (Signed)
Spoke with the patient who states that when she was discharged from the hospital she was told to no longer take diltiazem. She was advised to call with her BP and HR readings after one week. Readings are listed in note below. Patient states that she is currently wearing a heart monitor and will be taking it off tomorrow.

## 2022-05-21 ENCOUNTER — Telehealth: Payer: Self-pay | Admitting: Cardiology

## 2022-05-21 NOTE — Telephone Encounter (Signed)
Spoke with patient in regards to recommendations from Dr. Radford Pax. Patient verbalized understanding.

## 2022-05-21 NOTE — Telephone Encounter (Signed)
Pt c/o medication issue:  1. Name of Medication:   amLODipine (NORVASC) 5 MG tablet   2. How are you currently taking this medication (dosage and times per day)?  Take 1 tablet (5 mg total) by mouth daily. 3. Are you having a reaction (difficulty breathing--STAT)? No  4. What is your medication issue? Pt states that since taking medication she has been getting dizzy and feeling really tired. She would like a callback. Please advise

## 2022-05-21 NOTE — Telephone Encounter (Signed)
Spoke with the patient who reports that since starting amlodipine she has been exhausted. She states that she has also been getting dizzy when she stands up.  She reports the follow blood pressures: 179/90, 167/83, 124/71, 152/82, 145/82, 140/56, 147/77, 128/85, 145/70, and 156/79. Heart rate has been in the 80s.  She states that she is drinking plenty of fluids. Will send to Dr. Radford Pax for further recommendations.

## 2022-05-31 NOTE — Addendum Note (Signed)
Encounter addended by: Carylon Perches, CMA on: 05/31/2022 2:34 PM  Actions taken: Imaging Exam ended

## 2022-06-01 DIAGNOSIS — I441 Atrioventricular block, second degree: Secondary | ICD-10-CM | POA: Insufficient documentation

## 2022-06-01 NOTE — Progress Notes (Signed)
Called patient to inform her of her monitor results. Informed her that her monitor showed predominantly sinus rhythm with an average HR of 86 BPM. HR did range from 35-136 BPM and there was transiently second degree AV block. Patient admitted to having some dizzy spells recently, often when going from sitting to standing but can also occur when she is walking. However, she denied having any chest pain, palpitations, syncope or near syncope. I confirmed that patient had stopped taking diltiazem. She is not on any AV nodal blocking medications at this time.   Patient has an appointment with Richardson Dopp, PA-C tomorrow 8/23. She is aware of the appointment

## 2022-06-01 NOTE — Progress Notes (Deleted)
Cardiology Office Note:    Date:  06/01/2022   ID:  Alice, Cole 1946/02/26, MRN [id number]  PCP:  Alice [facility] Providers Cardiologist:  Alice Him, MD { Click to update primary MD,subspecialty MD or APP then REFRESH:1}  *** Referring MD: Alice Cole Physicians An*   Chief Complaint:  No chief complaint on file. {Click here for Visit Info    :1}   Patient Profile: Coronary artery disease  NSTEMI in July 2023 s/p DES to [facility] Chronic shortness of breath 2/2 obesity, diastolic dysfunction, poorly controlled HTN, deconditioning PFTs with mild to moderate restriction-high-res CT w/o ILD Myoview in 2017: No ischemia Echo in 2017: Normal EF, + diastolic dysfunction Sleep apnea Hypertension Diabetes mellitus Hyperlipidemia PVCs Pulmonary nodule Intermittent complete heart block (nocturnal) Eval by EP: No indication for pacemaker; continue to monitor History of DVT GERD Lower extremity edema  Prior CV Studies: NON-TELEMETRY MONITORING HOOKUP AND INTERP 05/31/2022   Predominant rhythm was normal sinus rhythm with average heart rate 86bpm and ranged from 35 to 136bpm.   Isolated PACs, atrial couplest and atrial triplets.   Isolated PVCs, ventricular couplets and triplets   Transient second degree AV block both Mobitz 1 and 2:1 block  LEFT HEART CATH 05/03/2022   Ost RCA to Prox RCA lesion is 95% stenosed.   Prox Cx lesion is 50% stenosed.   A drug-eluting stent was successfully placed using a SYNERGY XD 3.0X16. 1.  Severe ostial RCA stenosis (RFR equals 0.36) treated successfully with high-pressure balloon angioplasty and stenting using a 3.0 x 16 mm Synergy DES 2.  Moderate proximal left circumflex stenosis with negative RFR evaluation of 0.96 3.  Widely patent left main 4.  Patent LAD with mild diffuse calcific plaquing Recommendations: Aspirin and clopidogrel 12 months without interruption, aggressive risk reduction measures    ECHO COMPLETE WO IMAGING ENHANCING AGENT 05/02/2022 IMPRESSIONS 1. Left ventricular ejection fraction, by estimation, is 65 to 70%. Left ventricular ejection fraction by 3D volume is 63 %. The left ventricle has normal function. The left ventricle has no regional wall motion abnormalities. There is moderate asymmetric left ventricular hypertrophy of the basal-septal segment. Left ventricular diastolic parameters are consistent with Grade I diastolic dysfunction (impaired relaxation). 2. Right ventricular systolic function is normal. The right ventricular size is normal. Tricuspid regurgitation signal is inadequate for assessing PA pressure. 3. The mitral valve is normal in structure. No evidence of mitral valve regurgitation. No evidence of mitral stenosis. 4. The aortic valve is tricuspid. There is mild thickening of the aortic valve. Aortic valve regurgitation is not visualized. Aortic valve sclerosis is present, with no evidence of aortic valve stenosis.   LONG TERM MONITOR (8-14 DAYS) HOOK UP AND INTERPRETATION 02/12/2022  Predominant rhythm was normal sinus rhythm with an average heart rate of 76 bpm and ranged from 49 to 127 bpm.  Rare PACs  Rare PVCs, ventricular couplets and triplets as well as ventricular bigeminy and trigeminy. PVC load less than 1%.  Occasional accelerated junctional rhythm  Intermittent complete heart block occurring during sleep at 49 bpm lasting up to 7.9 seconds in duration with ventricular escape rhythm  GATED SPECT MYO PERF W/LEXISCAN STRESS 1D 04/01/2021 EF 76; normal perfusion; low risk  History of Present Illness:   Alice Cole is a 76 y.o. female with the above problem list.  She was last seen by Dr. Radford Pax in May 2023.  She was admitted 7/22-7/25 with a non-STEMI.  Echocardiogram demonstrated normal LV function.  Cardiac catheterization demonstrated 95% ostial RCA stenosis.  There was moderate disease in the LCx which was negative by RFR and mild  plaquing in the LAD.  She was noted to have intermittent second-degree type I AV block on telemetry during sleep.  14-day ZIO AT monitor was ordered at discharge.  She is not on beta-blockers secondary to history of anaphylaxis.  Diltiazem was stopped due to intermittent second-degree type I AV block.  Pravastatin was changed to atorvastatin due to elevated LDL.  The patient's monitor was recently interpreted and demonstrated sinus rhythm with isolated PACs and isolated PVCs.  There was transient second-degree AV block-both Mobitz 1 and 2:1 AV block.  Since discharge, the patient's medications have been adjusted with the addition of amlodipine for uncontrolled blood pressure.  She has called in with symptoms of dizziness.  She returns for follow-up.***    Past Medical History:  Diagnosis Date   Abnormal EKG 07/09/2016   Abnormal liver function    Adenomatous colon polyp    Arthritis    Bigeminy    Colonic polyp    Dermatitis    Diabetes mellitus    Displaced fracture of right femoral neck ([place]) 11/24/2018   Diverticulitis    recurrent   DVT (deep venous thrombosis) (HCC)    Esophageal stricture    GERD (gastroesophageal reflux disease)    Hemorrhoids    Hyperlipidemia    Hypertension    OSA (obstructive sleep apnea) 07/09/2016   last PSG showed no significant OSA   PVC's (premature ventricular contractions)    Current Medications: No outpatient medications have been marked as taking for the 06/02/22 encounter (Appointment) with Richardson Dopp T, PA-C.    Allergies:   Metoprolol, Nortriptyline, Olmesartan, Tradjenta [linagliptin], Alatrofloxacin, Beta adrenergic blockers, Cardizem [diltiazem], Cephalexin, Codeine, Colesevelam hcl, Crestor [rosuvastatin calcium], Flagyl [metronidazole], Fluvastatin sodium, Levemir [insulin detemir], Lisinopril, Penicillin g, Rosuvastatin, Sulfamethoxazole-trimethoprim, Verapamil, Adhesive [tape], Sitagliptin, and Trovan [trovafloxacin]   Social History    Tobacco Use   Smoking status: Never   Smokeless tobacco: Never  Vaping Use   Vaping Use: Never used  Substance Use Topics   Alcohol use: No    Comment: used to drink 30 yrs ago   Drug use: No    Family Hx: The patient's family history includes Cancer in her daughter; Diabetes in an other family member; Heart attack (age of onset: 21) in her mother; Heart attack (age of onset: 42) in her maternal grandfather; Heart attack (age of onset: 33) in her maternal grandmother; Heart attack (age of onset: 1) in her brother; Heart disease in an other family member.  ROS   EKGs/Labs/Other Test Reviewed:    EKG:  EKG is *** ordered today.  The ekg ordered today demonstrates ***  Recent Labs: 05/01/2022: ALT 31 05/04/2022: BUN 18; Creatinine, Ser 1.01; Hemoglobin 12.1; Magnesium 2.0; Platelets 195; Potassium 3.7; Sodium 139   Recent Lipid Panel Recent Labs    05/02/22 0351  CHOL 166  TRIG 185*  HDL 37*  VLDL 37  LDLCALC 92     Risk Assessment/Calculations/Metrics:   {Does this patient have ATRIAL FIBRILLATION?[phone]} STOP-Bang Score:  5  { Consider Dx Sleep Disordered Breathing or Sleep Apnea  ICD G47.33          :1}    No BP recorded.  {Refresh Note OR Click here to enter BP  :1}***    Physical Exam:    VS:  There were no vitals taken  for this visit.    Wt Readings from Last 3 Encounters:  05/04/22 211 lb 3.2 oz (95.8 kg)  02/23/22 209 lb (94.8 kg)  02/22/22 209 lb 9.6 oz (95.1 kg)    Physical Exam ***     ASSESSMENT & PLAN:   No problem-specific Assessment & Plan notes found for this encounter.   {The patient has an active order for outpatient cardiac rehabilitation.   Please indicate if the patient is ready to start. Do NOT delete this.  It will auto delete.  Refresh note, then sign.              Click here to document readiness and see contraindications.  :1}  Cardiac Rehabilitation Eligibility Assessment        {Are you ordering a CV Procedure (e.g.  stress test, cath, DCCV, TEE, etc)?   Press F2        :[id number]}   Dispo:  No follow-ups on file.   Medication Adjustments/Labs and Tests Ordered: Current medicines are reviewed at length with the patient today.  Concerns regarding medicines are outlined above.  Tests Ordered: No orders of the defined types were placed in this encounter.  Medication Changes: No orders of the defined types were placed in this encounter.  Signed, Richardson Dopp, PA-C  06/01/2022 4:39 PM    [facility], [place], [place]  [address] Phone: [phone]; Fax: [phone]

## 2022-06-02 ENCOUNTER — Ambulatory Visit: Payer: HMO | Admitting: Physician Assistant

## 2022-06-02 DIAGNOSIS — E782 Mixed hyperlipidemia: Secondary | ICD-10-CM

## 2022-06-02 DIAGNOSIS — U071 COVID-19: Secondary | ICD-10-CM | POA: Diagnosis not present

## 2022-06-02 DIAGNOSIS — I10 Essential (primary) hypertension: Secondary | ICD-10-CM

## 2022-06-02 DIAGNOSIS — I214 Non-ST elevation (NSTEMI) myocardial infarction: Secondary | ICD-10-CM

## 2022-06-02 DIAGNOSIS — I441 Atrioventricular block, second degree: Secondary | ICD-10-CM

## 2022-06-03 ENCOUNTER — Ambulatory Visit: Payer: HMO | Admitting: Podiatry

## 2022-06-22 NOTE — Progress Notes (Unsigned)
Cardiology Office Note:    Date:  06/23/2022   ID:  Alice Cole, DOB [birth year], MRN [id number]  PCP:  Pa, [place] Providers Cardiologist:  Fransico Him, MD     Referring MD: Jamey Ripa Physicians An*   Chief Complaint:  Hospitalization Follow-up (S/p non-STEMI treated with DES to the RCA)    Patient Profile: Coronary artery disease  NSTEMI 04/2022 s/p 3 x 16 mm DES to pRCA Allergy to beta-blocker  PVCs Intermittent/Nocturnal heart block Eval by EP ([place]) in 5/23 - no indication for pacer  Diabetes mellitus  Chronic kidney disease  Hypertension  Hyperlipidemia  GERD OSA on CPAP  Hx of DVT  Prior CV Studies: NON-TELEMETRY MONITORING HOOKUP AND INTERP 05/31/2022   Predominant rhythm was normal sinus rhythm with average heart rate 86bpm and ranged from 35 to 136bpm.   Isolated PACs, atrial couplest and atrial triplets.   Isolated PVCs, ventricular couplets and triplets   Transient second degree AV block both Mobitz 1 and 2:1 block  LEFT HEART CATH 05/03/2022 LAD mild diffuse calcific plaquing LCx 50 (RFR 0.96) RCA ostial 95 PCI: 3 x 16 mm Synergy DES to the RCA   ECHO COMPLETE WO IMAGING ENHANCING AGENT 05/02/2022 EF 65-70, no RWMA, GR 1 DD, normal RVSF  LONG TERM MONITOR (8-14 DAYS) HOOK UP AND INTERPRETATION 02/12/2022  Predominant rhythm was normal sinus rhythm with an average heart rate of 76 bpm and ranged from 49 to 127 bpm.  Rare PACs  Rare PVCs, ventricular couplets and triplets as well as ventricular bigeminy and trigeminy. PVC load less than 1%.  Occasional accelerated junctional rhythm  Intermittent complete heart block occurring during sleep at 49 bpm lasting up to 7.9 seconds in duration with ventricular escape rhythm  Dr. Ali Lowe who is DOD in the office was notified and monitor reviewed. Patient is to be set up for EP evaluation for pacemaker and go to the ER for any symptoms.  GATED SPECT MYO PERF  W/LEXISCAN STRESS 1D 04/01/2021 EF 76, normal perfusion, low risk  CPET 03/22/17 Conclusion: Exercise testing with gas exchange demonstrates low-normal functional capacity when compared to matched sedentary norms. There is no indication of pulmonary limitation or exercise-induced bronchospasm. Given elevated VE/VCO2 and early plateau of O2 pulse, circulatory limitations cannot be excluded. At peak exercise, with adjustments to predicted weight values, patient appears primarily limited due to her body habitus and deconditioning.   CARDIAC TELEMETRY MONITORING-INTERPRETATION ONLY 07/14/2016  Normal Sinus Rhythm with heart rate 73-98bpm  Occasional PVCs and ventricular escape beats.  Occasional accelerated junction rhythm  Transient type I second degree AV block during sleep  CAROTID US 08/02/12 Calcified   bilateral carotid bifurcation and proximal ICA  plaque, resulting in less than 50% diameter stenosis. The exam does  not exclude plaque ulceration or embolization.  Continued  surveillance recommended.   History of Present Illness:   Alice Cole is a 76 y.o. female with the above problem list.  She was admitted 7/22-7/25 with a NSTEMI. Cardiac catheterization demonstrated high grade disease in the pRCA which was tx with a DES. EF is preserved on echocardiogram She was noted to have intermittent 2nd degree HB Type I. Her diltiazem was DC'd. An OP monitor was ordered and demonstrated transient Wenckebach.  She returns for f/u.  She is here alone.  Since discharge from the hospital, she developed COVID.  She had to cancel her initial appointment.  She has recovered.  She has  not had any recurrent chest pain.  She has not had significant shortness of breath.  Overall, she feels her breathing has improved.  She sleeps on an incline chronically.  She did have some leg edema last week but this resolved.  She has episodes of feeling strange and has documented her heart rate into the 40s.  She has not  really had any significant drop in her heart rate in the last 2 weeks.  She has had some dizziness with standing.  She has not had syncope or near syncope.    Past Medical History:  Diagnosis Date   Abnormal EKG 07/09/2016   Abnormal liver function    Adenomatous colon polyp    Arthritis    Bigeminy    Colonic polyp    Dermatitis    Diabetes mellitus    Displaced fracture of right femoral neck ([place]) 11/24/2018   Diverticulitis    recurrent   DVT (deep venous thrombosis) (HCC)    Esophageal stricture    GERD (gastroesophageal reflux disease)    Hemorrhoids    Hyperlipidemia    Hypertension    OSA (obstructive sleep apnea) 07/09/2016   last PSG showed no significant OSA   PVC's (premature ventricular contractions)    Current Medications: Current Meds  Medication Sig   acetaminophen (TYLENOL) 500 MG tablet Take 1,000 mg by mouth every 6 (six) hours as needed for moderate pain or headache.   alendronate (FOSAMAX) 70 MG tablet Take 70 mg by mouth once a week.   ALPRAZolam (XANAX) 0.25 MG tablet Take 0.25 mg by mouth daily as needed for anxiety.   amLODipine (NORVASC) 5 MG tablet Take 1 tablet (5 mg total) by mouth daily.   aspirin 81 MG tablet Take 81 mg by mouth daily.   atorvastatin (LIPITOR) 80 MG tablet Take 1 tablet (80 mg total) by mouth daily.   BD PEN NEEDLE NANO U/F 32G X 4 MM MISC    cholecalciferol (VITAMIN D3) 25 MCG (1000 UT) tablet Take 1,000 Units by mouth daily.   hydrochlorothiazide (HYDRODIURIL) 25 MG tablet Take 25 mg by mouth daily.   LANTUS SOLOSTAR 100 UNIT/ML Solostar Pen Inject 24 Units into the skin daily.   NOVOLOG FLEXPEN 100 UNIT/ML FlexPen Inject 38-50 Units into the skin 3 (three) times daily with meals. Take 40 units in the morning, Take 38 units at lunch, Take 50 units in the evening.   pantoprazole (PROTONIX) 40 MG tablet Take 1 tablet (40 mg total) by mouth daily.   ramipril (ALTACE) 10 MG capsule Take 1 capsule by mouth 2 (two) times daily.    [DISCONTINUED] clopidogrel (PLAVIX) 75 MG tablet Take 1 tablet (75 mg total) by mouth daily with breakfast.    Allergies:   Metoprolol, Nortriptyline, Olmesartan, Tradjenta [linagliptin], Alatrofloxacin, Beta adrenergic blockers, Cardizem [diltiazem], Cephalexin, Codeine, Colesevelam hcl, Crestor [rosuvastatin calcium], Flagyl [metronidazole], Fluvastatin sodium, Levemir [insulin detemir], Lisinopril, Penicillin g, Rosuvastatin, Sulfamethoxazole-trimethoprim, Verapamil, Adhesive [tape], Sitagliptin, and Trovan [trovafloxacin]   Social History   Tobacco Use   Smoking status: Never   Smokeless tobacco: Never  Vaping Use   Vaping Use: Never used  Substance Use Topics   Alcohol use: No    Comment: used to drink 30 yrs ago   Drug use: No    Family Hx: The patient's family history includes Cancer in her daughter; Diabetes in an other family member; Heart attack (age of onset: 22) in her mother; Heart attack (age of onset: 76) in her maternal grandfather; Heart  attack (age of onset: 58) in her maternal grandmother; Heart attack (age of onset: 50) in her brother; Heart disease in an other family member.  Review of Systems  Gastrointestinal:  Negative for hematochezia.  Genitourinary:  Negative for hematuria.     EKGs/Labs/Other Test Reviewed:    EKG:  EKG is ordered today.  The ekg ordered today demonstrates NSR, HR 94, inferior Q waves, anterior Q waves, QTc 427, similar to prior tracings  Recent Labs: 05/01/2022: ALT 31 05/04/2022: BUN 18; Creatinine, Ser 1.01; Hemoglobin 12.1; Magnesium 2.0; Platelets 195; Potassium 3.7; Sodium 139   Recent Lipid Panel Recent Labs    05/02/22 0351  CHOL 166  TRIG 185*  HDL 37*  VLDL 37  LDLCALC 92     Risk Assessment/Calculations/Metrics:     STOP-Bang Score:  5           Physical Exam:    VS:  BP 130/68   Pulse 94   Ht 5' 7"  (1.702 m)   Wt 207 lb 12.8 oz (94.3 kg)   SpO2 96%   BMI 32.55 kg/m     Wt Readings from Last 3 Encounters:   06/23/22 207 lb 12.8 oz (94.3 kg)  05/04/22 211 lb 3.2 oz (95.8 kg)  02/23/22 209 lb (94.8 kg)    Constitutional:      Appearance: Healthy appearance. Not in distress.  Neck:     Vascular: JVD normal.  Pulmonary:     Effort: Pulmonary effort is normal.     Breath sounds: No wheezing. No rales.  Cardiovascular:     Normal rate. Regular rhythm. Normal S1. Normal S2.      Murmurs: There is no murmur.     Comments: Right wrist without hematoma Edema:    Peripheral edema absent.  Abdominal:     Palpations: Abdomen is soft.  Skin:    General: Skin is warm and dry.  Neurological:     Mental Status: Alert and oriented to person, place and time.        ASSESSMENT & PLAN:   Coronary artery disease involving native coronary artery of native heart without angina pectoris Status post non-STEMI in July 2023.  She is doing well without anginal symptoms.  I have encouraged her to start cardiac rehab.  We discussed the importance of dual antiplatelet therapy for minimum of 1 year post MI.  Continue aspirin 81 mg daily, Plavix 75 mg daily, Lipitor 80 mg daily.  She is not on beta-blocker secondary to previous allergy as well as history of transient complete heart block.  Orthostatic hypotension She does have a blood pressure drop from lying to standing today.  However, she does not have a significant heart rate increase.  She has minimal symptoms of dizziness with standing.  I have encouraged her to use compression stockings and to get up slowly.  Her blood pressure is still somewhat borderline.  I would like to avoid decreasing her medicines if at all possible.  She knows to contact us if her dizziness should get worse.  Obtain BMET, CBC today.  Essential hypertension, benign As noted, she does have some borderline blood pressures at times.  She does have some symptoms of orthostatic hypotension.  For now, continue amlodipine 5 mg daily, HCTZ 25 mg daily, Altace 10 mg twice daily.  If she has  worsening symptoms of orthostasis or worsening orthostatic hypotension, we may need to decrease her HCTZ.  Mixed hyperlipidemia Continue Lipitor 80 mg daily.  Obtain follow-up  CMET, lipids in 2 months.  Type 2 diabetes mellitus with chronic kidney disease, with long-term current use of insulin ([place]) She notes that her endocrinologist (Dr. Buddy Duty) would like her to start empagliflozin.  We discussed the benefits of this drug.  I think it would be a good addition to her current regimen.  Mobitz type 1 second degree AV block She has had a history of transient complete heart block during sleep.  She had some Mobitz 1 noted in the hospital.  She is now off of diltiazem.  She did have some episodes of Mobitz 1 on her recent monitor as well as 2-1 block.  She has not had syncope or near syncope.  This point, I do not think she needs to get back and see EP.  She knows to contact us if her symptoms should worsen.  Keep follow-up with Dr. Radford Pax in November.     Cardiac Rehabilitation Eligibility Assessment  The patient is ready to start cardiac rehabilitation from a cardiac standpoint.          Dispo:  Return in 2 months (on 09/06/2022) for Scheduled Follow Up with Dr. Radford Pax.   Medication Adjustments/Labs and Tests Ordered: Current medicines are reviewed at length with the patient today.  Concerns regarding medicines are outlined above.  Tests Ordered: Orders Placed This Encounter  Procedures   Basic metabolic panel   CBC   Comp Met (CMET)   Lipid panel   EKG 12-Lead   Medication Changes: Meds ordered this encounter  Medications   clopidogrel (PLAVIX) 75 MG tablet    Sig: Take 1 tablet (75 mg total) by mouth daily with breakfast.    Dispense:  90 tablet    Refill:  3   Signed, Richardson Dopp, PA-C  06/23/2022 12:27 PM    [facility], [place], [place]  [address] Phone: [phone]; Fax: [phone]

## 2022-06-23 ENCOUNTER — Encounter: Payer: Self-pay | Admitting: Physician Assistant

## 2022-06-23 ENCOUNTER — Ambulatory Visit: Payer: HMO | Attending: Physician Assistant | Admitting: Physician Assistant

## 2022-06-23 VITALS — BP 130/68 | HR 94 | Ht 67.0 in | Wt 207.8 lb

## 2022-06-23 DIAGNOSIS — E782 Mixed hyperlipidemia: Secondary | ICD-10-CM | POA: Diagnosis not present

## 2022-06-23 DIAGNOSIS — I214 Non-ST elevation (NSTEMI) myocardial infarction: Secondary | ICD-10-CM | POA: Diagnosis not present

## 2022-06-23 DIAGNOSIS — M810 Age-related osteoporosis without current pathological fracture: Secondary | ICD-10-CM | POA: Diagnosis not present

## 2022-06-23 DIAGNOSIS — Z794 Long term (current) use of insulin: Secondary | ICD-10-CM

## 2022-06-23 DIAGNOSIS — I2510 Atherosclerotic heart disease of native coronary artery without angina pectoris: Secondary | ICD-10-CM

## 2022-06-23 DIAGNOSIS — I10 Essential (primary) hypertension: Secondary | ICD-10-CM | POA: Diagnosis not present

## 2022-06-23 DIAGNOSIS — I951 Orthostatic hypotension: Secondary | ICD-10-CM | POA: Diagnosis not present

## 2022-06-23 DIAGNOSIS — I441 Atrioventricular block, second degree: Secondary | ICD-10-CM

## 2022-06-23 DIAGNOSIS — E1122 Type 2 diabetes mellitus with diabetic chronic kidney disease: Secondary | ICD-10-CM | POA: Diagnosis not present

## 2022-06-23 DIAGNOSIS — N1831 Chronic kidney disease, stage 3a: Secondary | ICD-10-CM

## 2022-06-23 DIAGNOSIS — E1136 Type 2 diabetes mellitus with diabetic cataract: Secondary | ICD-10-CM | POA: Diagnosis not present

## 2022-06-23 MED ORDER — CLOPIDOGREL BISULFATE 75 MG PO TABS: 75.0000 mg | ORAL_TABLET | Freq: Every day | ORAL | 3 refills | Status: DC

## 2022-06-23 NOTE — Assessment & Plan Note (Signed)
As noted, she does have some borderline blood pressures at times.  She does have some symptoms of orthostatic hypotension.  For now, continue amlodipine 5 mg daily, HCTZ 25 mg daily, Altace 10 mg twice daily.  If she has worsening symptoms of orthostasis or worsening orthostatic hypotension, we may need to decrease her HCTZ.

## 2022-06-23 NOTE — Patient Instructions (Signed)
Medication Instructions:  Your physician recommends that you continue on your current medications as directed. Please refer to the Current Medication list given to you today.  *If you need a refill on your cardiac medications before your next appointment, please call your pharmacy*   Lab Work: TODAY:  BMET & CBC  09/06/22 WHEN YOU COME TO SEE DR. Radford Pax, COME FASTING SO WE CAN CHECK:  LIPID & CMET    If you have labs (blood work) drawn today and your tests are completely normal, you will receive your results only by: [place] (if you have MyChart) OR A paper copy in the mail If you have any lab test that is abnormal or we need to change your treatment, we will call you to review the results.   Testing/Procedures: None ordered   Follow-Up: At [facility], you and your health needs are our priority.  As part of our continuing mission to provide you with exceptional heart care, we have created designated Provider Care Teams.  These Care Teams include your primary Cardiologist (physician) and Advanced Practice Providers (APPs -  Physician Assistants and Nurse Practitioners) who all work together to provide you with the care you need, when you need it.  We recommend signing up for the patient portal called "MyChart".  Sign up information is provided on this After Visit Summary.  MyChart is used to connect with patients for Virtual Visits (Telemedicine).  Patients are able to view lab/test results, encounter notes, upcoming appointments, etc.  Non-urgent messages can be sent to your provider as well.   To learn more about what you can do with MyChart, go to [email or web address].    Your next appointment:   AS SCHEDULED  The format for your next appointment:   In Person  Provider:   Fransico Him, MD     Other Instructions  Make sure to stay hydrated  You can wear compression stockings, lower extremity / knee high   Important Information About Sugar

## 2022-06-23 NOTE — Assessment & Plan Note (Signed)
Status post non-STEMI in July 2023.  She is doing well without anginal symptoms.  I have encouraged her to start cardiac rehab.  We discussed the importance of dual antiplatelet therapy for minimum of 1 year post MI.  Continue aspirin 81 mg daily, Plavix 75 mg daily, Lipitor 80 mg daily.  She is not on beta-blocker secondary to previous allergy as well as history of transient complete heart block.

## 2022-06-23 NOTE — Assessment & Plan Note (Signed)
Continue Lipitor 80 mg daily.  Obtain follow-up CMET, lipids in 2 months.

## 2022-06-23 NOTE — Assessment & Plan Note (Signed)
She notes that her endocrinologist (Dr. Buddy Duty) would like her to start empagliflozin.  We discussed the benefits of this drug.  I think it would be a good addition to her current regimen.

## 2022-06-23 NOTE — Assessment & Plan Note (Addendum)
She does have a blood pressure drop from lying to standing today.  However, she does not have a significant heart rate increase.  She has minimal symptoms of dizziness with standing.  I have encouraged her to use compression stockings and to get up slowly.  Her blood pressure is still somewhat borderline.  I would like to avoid decreasing her medicines if at all possible.  She knows to contact us if her dizziness should get worse.  Obtain BMET, CBC today.

## 2022-06-23 NOTE — Assessment & Plan Note (Signed)
She has had a history of transient complete heart block during sleep.  She had some Mobitz 1 noted in the hospital.  She is now off of diltiazem.  She did have some episodes of Mobitz 1 on her recent monitor as well as 2-1 block.  She has not had syncope or near syncope.  This point, I do not think she needs to get back and see EP.  She knows to contact us if her symptoms should worsen.  Keep follow-up with Dr. Radford Pax in November.

## 2022-06-24 LAB — BASIC METABOLIC PANEL
BUN/Creatinine Ratio: 16 (ref 12–28)
BUN: 18 mg/dL (ref 8–27)
CO2: 24 mmol/L (ref 20–29)
Calcium: 9.8 mg/dL (ref 8.7–10.3)
Chloride: 102 mmol/L (ref 96–106)
Creatinine, Ser: 1.12 mg/dL — ABNORMAL HIGH (ref 0.57–1.00)
Glucose: 136 mg/dL — ABNORMAL HIGH (ref 70–99)
Potassium: 3.9 mmol/L (ref 3.5–5.2)
Sodium: 142 mmol/L (ref 134–144)
eGFR: 51 mL/min/{1.73_m2} — ABNORMAL LOW (ref >59)

## 2022-06-24 LAB — CBC
Hematocrit: 40.5 % (ref 34.0–46.6)
Hemoglobin: 13.3 g/dL (ref 11.1–15.9)
MCH: 29.4 pg (ref 26.6–33.0)
MCHC: 32.8 g/dL (ref 31.5–35.7)
MCV: 90 fL (ref 79–97)
Platelets: 245 10*3/uL (ref 150–450)
RBC: 4.52 x10E6/uL (ref 3.77–5.28)
RDW: 12.5 % (ref 11.7–15.4)
WBC: 7.5 10*3/uL (ref 3.4–10.8)

## 2022-07-14 ENCOUNTER — Telehealth (HOSPITAL_COMMUNITY): Payer: Self-pay

## 2022-07-14 NOTE — Telephone Encounter (Signed)
Pt insurance is active and benefits verified through [employer]. Co-pay $0.00, DED $0.00/$0.00 met, out of pocket $5,000.00/$1,267.95 met, co-insurance 0%. No pre-authorization required. Laureana R./[employer], 07/14/22 @ 3:01PM, LAG#[id number]   How many CR sessions are covered? (36 sessions for TCR, 72 sessions for ICR)72 Is this a lifetime maximum or an annual maximum? No Has the member used any of these services to date? No Is there a time limit (weeks/months) on start of program and/or program completion? No

## 2022-07-14 NOTE — Telephone Encounter (Signed)
Called patient to see if she was interested in participating in the Cardiac Rehab Program. Patient stated yes. Patient will come in for orientation on 07/15/22 @ 11:30AM and will attend the 12:30PM exercise class.   went over insurance, patient verbalized understanding.

## 2022-07-15 ENCOUNTER — Ambulatory Visit (HOSPITAL_COMMUNITY): Payer: HMO

## 2022-07-19 ENCOUNTER — Ambulatory Visit (HOSPITAL_COMMUNITY): Payer: HMO

## 2022-07-21 ENCOUNTER — Ambulatory Visit (HOSPITAL_COMMUNITY): Payer: HMO

## 2022-07-23 ENCOUNTER — Ambulatory Visit (HOSPITAL_COMMUNITY): Payer: HMO

## 2022-07-26 ENCOUNTER — Ambulatory Visit (HOSPITAL_COMMUNITY): Payer: HMO

## 2022-07-28 ENCOUNTER — Ambulatory Visit (HOSPITAL_COMMUNITY): Payer: HMO

## 2022-07-30 ENCOUNTER — Ambulatory Visit (HOSPITAL_COMMUNITY): Payer: HMO

## 2022-08-02 ENCOUNTER — Ambulatory Visit (HOSPITAL_COMMUNITY): Payer: HMO

## 2022-08-02 ENCOUNTER — Other Ambulatory Visit: Payer: Self-pay

## 2022-08-02 NOTE — Telephone Encounter (Signed)
Pt calling requesting a refill on pantoprazole. Would Dr. Radford Pax like to refill this medication? Please address

## 2022-08-04 ENCOUNTER — Ambulatory Visit (HOSPITAL_COMMUNITY): Payer: HMO

## 2022-08-06 ENCOUNTER — Ambulatory Visit (HOSPITAL_COMMUNITY): Payer: HMO

## 2022-08-09 ENCOUNTER — Ambulatory Visit (HOSPITAL_COMMUNITY): Payer: HMO

## 2022-08-11 ENCOUNTER — Ambulatory Visit (HOSPITAL_COMMUNITY): Payer: HMO

## 2022-08-13 ENCOUNTER — Ambulatory Visit (HOSPITAL_COMMUNITY): Payer: HMO

## 2022-08-16 ENCOUNTER — Ambulatory Visit (HOSPITAL_COMMUNITY): Payer: HMO

## 2022-08-16 ENCOUNTER — Telehealth: Payer: Self-pay | Admitting: Cardiology

## 2022-08-16 DIAGNOSIS — E782 Mixed hyperlipidemia: Secondary | ICD-10-CM

## 2022-08-16 NOTE — Telephone Encounter (Signed)
Pt c/o medication issue:  1. Name of Medication:  atorvastatin (LIPITOR) 80 MG tablet  2. How are you currently taking this medication (dosage and times per day)?   3. Are you having a reaction (difficulty breathing--STAT)?   4. What is your medication issue?   Patient states this medication was causing leg pain and she stopped taking it about 1-2 weeks ago. Patient would like to know if there is an alternate medication she can be put on. She states she called her primary doctor first, but they advised to follow up with heart care because our office changed the medication.

## 2022-08-16 NOTE — Telephone Encounter (Signed)
Spoke with pt and since stopping Atorvastatin leg pain has resolved Pt was instructed by PCP to call our office to get an alternate med Pt has taken Pravastatin in the past but this was changed when was discharged from hospital back in July after having a stent placed  Will forward to Dr Radford Pax for review and recommendations

## 2022-08-16 NOTE — Telephone Encounter (Signed)
Referrral placed for lipid clinic per Dr Radford Pax and pt aware will be getting phone call to make an appt ./cy

## 2022-08-17 ENCOUNTER — Telehealth (HOSPITAL_COMMUNITY): Payer: Self-pay

## 2022-08-17 ENCOUNTER — Encounter (HOSPITAL_COMMUNITY): Payer: Self-pay

## 2022-08-17 NOTE — Telephone Encounter (Signed)
Attempted to call patient in regards to Cardiac Rehab - LM on VM Mailed letter 

## 2022-08-18 ENCOUNTER — Ambulatory Visit (HOSPITAL_COMMUNITY): Payer: HMO

## 2022-08-20 ENCOUNTER — Ambulatory Visit (HOSPITAL_COMMUNITY): Payer: HMO

## 2022-08-23 ENCOUNTER — Ambulatory Visit (HOSPITAL_COMMUNITY): Payer: HMO

## 2022-08-25 ENCOUNTER — Ambulatory Visit (HOSPITAL_COMMUNITY): Payer: HMO

## 2022-08-27 ENCOUNTER — Ambulatory Visit (HOSPITAL_COMMUNITY): Payer: HMO

## 2022-08-27 ENCOUNTER — Ambulatory Visit: Payer: HMO | Admitting: Cardiology

## 2022-08-30 ENCOUNTER — Ambulatory Visit (HOSPITAL_COMMUNITY): Payer: HMO

## 2022-09-01 ENCOUNTER — Ambulatory Visit (HOSPITAL_COMMUNITY): Payer: HMO

## 2022-09-03 ENCOUNTER — Ambulatory Visit (HOSPITAL_COMMUNITY): Payer: HMO

## 2022-09-05 NOTE — Progress Notes (Unsigned)
Cardiology Office Note:    Date:  09/06/2022   ID:  Alice Cole, DOB August 09, 1946, MRN [id number]  PCP:  [facility] Cardiologist:  Fransico Him, MD     Referring MD: [facility]*   Chief Complaint:  Coronary Artery Disease, Hypertension, Hyperlipidemia, and Sleep Apnea    Patient Profile: Coronary artery disease  NSTEMI 04/2022 s/p 3 x 16 mm DES to pRCA Allergy to beta-blocker  PVCs Intermittent/Nocturnal heart block Eval by EP ([place]) in 5/23 - no indication for pacer  Diabetes mellitus  Chronic kidney disease  Hypertension  Hyperlipidemia  GERD OSA on CPAP  Hx of DVT  Prior CV Studies: NON-TELEMETRY MONITORING HOOKUP AND INTERP 05/31/2022   Predominant rhythm was normal sinus rhythm with average heart rate 86bpm and ranged from 35 to 136bpm.   Isolated PACs, atrial couplest and atrial triplets.   Isolated PVCs, ventricular couplets and triplets   Transient second degree AV block both Mobitz 1 and 2:1 block  LEFT HEART CATH 05/03/2022 LAD mild diffuse calcific plaquing LCx 50 (RFR 0.96) RCA ostial 95 PCI: 3 x 16 mm Synergy DES to the RCA   ECHO COMPLETE WO IMAGING ENHANCING AGENT 05/02/2022 EF 65-70, no RWMA, GR 1 DD, normal RVSF  LONG TERM MONITOR (8-14 DAYS) HOOK UP AND INTERPRETATION 02/12/2022  Predominant rhythm was normal sinus rhythm with an average heart rate of 76 bpm and ranged from 49 to 127 bpm.  Rare PACs  Rare PVCs, ventricular couplets and triplets as well as ventricular bigeminy and trigeminy. PVC load less than 1%.  Occasional accelerated junctional rhythm  Intermittent complete heart block occurring during sleep at 49 bpm lasting up to 7.9 seconds in duration with ventricular escape rhythm  Dr. Ali Lowe who is DOD in the office was notified and monitor reviewed. Patient is to be set up for EP evaluation for pacemaker and go to the ER for any symptoms.  GATED SPECT MYO  PERF W/LEXISCAN STRESS 1D 04/01/2021 EF 76, normal perfusion, low risk  CPET 03/22/17 Conclusion: Exercise testing with gas exchange demonstrates low-normal functional capacity when compared to matched sedentary norms. There is no indication of pulmonary limitation or exercise-induced bronchospasm. Given elevated VE/VCO2 and early plateau of O2 pulse, circulatory limitations cannot be excluded. At peak exercise, with adjustments to predicted weight values, patient appears primarily limited due to her body habitus and deconditioning.   CARDIAC TELEMETRY MONITORING-INTERPRETATION ONLY 07/14/2016  Normal Sinus Rhythm with heart rate 73-98bpm  Occasional PVCs and ventricular escape beats.  Occasional accelerated junction rhythm  Transient type I second degree AV block during sleep  CAROTID US 08/02/12 Calcified   bilateral carotid bifurcation and proximal ICA  plaque, resulting in less than 50% diameter stenosis. The exam does  not exclude plaque ulceration or embolization.  Continued  surveillance recommended.   History of Present Illness:   Alice Cole is a 76 y.o. female with the above problem list.  She was admitted 7/22-7/25 with a NSTEMI. Cardiac catheterization demonstrated high grade disease in the pRCA which was tx with a DES. EF is preserved on echocardiogram She was noted to have intermittent 2nd degree HB Type I. Her diltiazem was DC'd. An OP monitor was ordered and demonstrated transient Wenckebach.  Seen by EP and recommended watching as episode was nocturnal and was encouraged to use CPAP. She was approved for a new device but never got her CPAP so she has not used her old  one in a long time.   She is here today for followup and is doing well.  Since her PCI she says that her breathing has significantly improved.  She still has some mild DOE at times but really does not limit her. She has joined [employer].  She denies any chest pain or pressure,  PND, orthopnea, LE edema,  dizziness, palpitations or syncope. She is compliant with her meds and is tolerating meds with no SE.  She has never gotten her CPAP that was ordered. She says that she has not been having slow heart beats like she was having prior to PCI.   Past Medical History:  Diagnosis Date   Abnormal EKG 07/09/2016   Abnormal liver function    Adenomatous colon polyp    Arthritis    Bigeminy    CAD (coronary artery disease), native coronary artery    50% LCx, 95% lRCA and mild LAD disease s/p PCI RCA 04/2022   Colonic polyp    Dermatitis    Diabetes mellitus    Displaced fracture of right femoral neck ([place]) 11/24/2018   Diverticulitis    recurrent   DVT (deep venous thrombosis) (HCC)    Esophageal stricture    GERD (gastroesophageal reflux disease)    Hemorrhoids    Hyperlipidemia    Hypertension    OSA (obstructive sleep apnea) 07/09/2016   last PSG showed no significant OSA   PVC's (premature ventricular contractions)    Current Medications: Current Meds  Medication Sig   acetaminophen (TYLENOL) 500 MG tablet Take 1,000 mg by mouth every 6 (six) hours as needed for moderate pain or headache.   alendronate (FOSAMAX) 70 MG tablet Take 70 mg by mouth once a week.   ALPRAZolam (XANAX) 0.25 MG tablet Take 0.25 mg by mouth daily as needed for anxiety.   amLODipine (NORVASC) 5 MG tablet Take 1 tablet (5 mg total) by mouth daily.   aspirin 81 MG tablet Take 81 mg by mouth daily.   cholecalciferol (VITAMIN D3) 25 MCG (1000 UT) tablet Take 1,000 Units by mouth daily.   clopidogrel (PLAVIX) 75 MG tablet Take 1 tablet (75 mg total) by mouth daily with breakfast.   hydrochlorothiazide (HYDRODIURIL) 25 MG tablet Take 25 mg by mouth daily.   LANTUS SOLOSTAR 100 UNIT/ML Solostar Pen Inject 24 Units into the skin daily.   NOVOLOG FLEXPEN 100 UNIT/ML FlexPen Inject 38-50 Units into the skin 3 (three) times daily with meals. Take 40 units in the morning, Take 38 units at lunch, Take 50 units in the  evening.   pantoprazole (PROTONIX) 40 MG tablet Take 1 tablet (40 mg total) by mouth daily.   ramipril (ALTACE) 10 MG capsule Take 1 capsule by mouth 2 (two) times daily.    Allergies:   Metoprolol, Nortriptyline, Olmesartan, Tradjenta [linagliptin], Alatrofloxacin, Atorvastatin, Beta adrenergic blockers, Cardizem [diltiazem], Cephalexin, Codeine, Colesevelam hcl, Crestor [rosuvastatin calcium], Flagyl [metronidazole], Fluvastatin sodium, Levemir [insulin detemir], Lisinopril, Penicillin g, Rosuvastatin, Sulfamethoxazole-trimethoprim, Verapamil, Adhesive [tape], Sitagliptin, and Trovan [trovafloxacin]   Social History   Tobacco Use   Smoking status: Never   Smokeless tobacco: Never  Vaping Use   Vaping Use: Never used  Substance Use Topics   Alcohol use: No    Comment: used to drink 30 yrs ago   Drug use: No    Family Hx: The patient's family history includes Cancer in her daughter; Diabetes in an other family member; Heart attack (age of onset: 62) in her mother; Heart attack (age  of onset: 25) in her maternal grandfather; Heart attack (age of onset: 66) in her maternal grandmother; Heart attack (age of onset: 27) in her brother; Heart disease in an other family member.  Review of Systems  Gastrointestinal:  Negative for hematochezia.  Genitourinary:  Negative for hematuria.     EKGs/Labs/Other Test Reviewed:    EKG:  EKG is not ordered today.   Recent Labs: 05/01/2022: ALT 31 05/04/2022: Magnesium 2.0 06/23/2022: BUN 18; Creatinine, Ser 1.12; Hemoglobin 13.3; Platelets 245; Potassium 3.9; Sodium 142   Recent Lipid Panel Recent Labs    05/02/22 0351  CHOL 166  TRIG 185*  HDL 37*  VLDL 37  LDLCALC 92     Risk Assessment/Calculations/Metrics:     STOP-Bang Score:  5  { Consider Dx Sleep Disordered Breathing or Sleep Apnea  ICD G47.33          :1}      Physical Exam:    VS:  BP (!) 148/76   Pulse 100   Ht '5\' 7"'$  (1.702 m)   Wt 211 lb (95.7 kg)   BMI 33.05 kg/m      Wt Readings from Last 3 Encounters:  09/06/22 211 lb (95.7 kg)  06/23/22 207 lb 12.8 oz (94.3 kg)  05/04/22 211 lb 3.2 oz (95.8 kg)  GEN: Well nourished, well developed in no acute distress HEENT: Normal NECK: No JVD; No carotid bruits LYMPHATICS: No lymphadenopathy CARDIAC:RRR, no murmurs, rubs, gallops RESPIRATORY:  Clear to auscultation without rales, wheezing or rhonchi  ABDOMEN: Soft, non-tender, non-distended MUSCULOSKELETAL:  No edema; No deformity  SKIN: Warm and dry NEUROLOGIC:  Alert and oriented x 3 PSYCHIATRIC:  Normal affect     ASSESSMENT & PLAN:       Coronary artery disease involving native coronary artery of native heart without angina pectoris -Status post non-STEMI in July 2023.   -she denies any anginal symptoms since being seen last -continue DAPT with ASA '81mg'$  daily, Plavix '75mg'$  daily  -no BB due to hx of transient heart block -statin intolerant   Orthostatic hypotension -she has had some dizzy spells but no syncope -I encouraged her to wear her compression hose during the day   Essential hypertension, benign -Bp is elevated on exam today -continue prescription drug management with HCTZ '25mg'$  daily and Altace '10mg'$  BID with PRN refills -increase Amlodipine to 7.'5mg'$  daily and check BP daily for a week and call with results -I have personally reviewed and interpreted outside labs performed by patient's PCP which showed SCr + 1.12,  and K+ 3.9 on 06/23/2022  Mixed hyperlipidemia -LDL goal < 70 -I have personally reviewed and interpreted outside labs performed by patient's PCP which showed LDL 92 and HDL 37 on 05/02/2022 -she stopped taking the atorvastatin due to severe muscle aches -she has a repeat FLP today and will be seen in lipid clinic in a few weeks to discuss [id number]   Type 2 diabetes mellitus with chronic kidney disease, with long-term current use of insulin ([place]) -Followed by endocrinologist (Dr. Buddy Duty)    Mobitz type 1 second degree AV  block She has had a history of transient complete heart block during sleep.  She had some Mobitz 1 noted in the hospital.  She is now off of diltiazem.  She did have some episodes of Mobitz 1 on her monitor as well as 2-1 block.   -she denies any syncope. She has not had syncope or near syncope.   -She knows to contact us if  she has any significant dizziness or syncope -I will get her back on CPAP since her events occurred mainly lat night  OSA  -she had a repeat HST 03/2022 showing mild OSA with AHI 6.9/hr and no central events -her CPAP was ordered and I will followup to make sure she gets her device   Medication Adjustments/Labs and Tests Ordered: Current medicines are reviewed at length with the patient today.  Concerns regarding medicines are outlined above.  Tests Ordered: No orders of the defined types were placed in this encounter.  Medication Changes: No orders of the defined types were placed in this encounter.  Signed, Fransico Him, MD  09/06/2022 9:18 AM    [facility] [place], [place], [place]  [address] Phone: [phone]; Fax: [phone]

## 2022-09-06 ENCOUNTER — Encounter: Payer: Self-pay | Admitting: Cardiology

## 2022-09-06 ENCOUNTER — Ambulatory Visit: Payer: HMO | Attending: Physician Assistant

## 2022-09-06 ENCOUNTER — Ambulatory Visit (HOSPITAL_COMMUNITY): Payer: HMO

## 2022-09-06 ENCOUNTER — Ambulatory Visit: Payer: HMO | Attending: Cardiology | Admitting: Cardiology

## 2022-09-06 VITALS — BP 148/76 | HR 100 | Ht 67.0 in | Wt 211.0 lb

## 2022-09-06 DIAGNOSIS — I951 Orthostatic hypotension: Secondary | ICD-10-CM | POA: Diagnosis not present

## 2022-09-06 DIAGNOSIS — E782 Mixed hyperlipidemia: Secondary | ICD-10-CM | POA: Diagnosis not present

## 2022-09-06 DIAGNOSIS — E1122 Type 2 diabetes mellitus with diabetic chronic kidney disease: Secondary | ICD-10-CM

## 2022-09-06 DIAGNOSIS — N1831 Chronic kidney disease, stage 3a: Secondary | ICD-10-CM

## 2022-09-06 DIAGNOSIS — I2510 Atherosclerotic heart disease of native coronary artery without angina pectoris: Secondary | ICD-10-CM

## 2022-09-06 DIAGNOSIS — I441 Atrioventricular block, second degree: Secondary | ICD-10-CM

## 2022-09-06 DIAGNOSIS — I10 Essential (primary) hypertension: Secondary | ICD-10-CM

## 2022-09-06 DIAGNOSIS — Z794 Long term (current) use of insulin: Secondary | ICD-10-CM

## 2022-09-06 MED ORDER — AMLODIPINE BESYLATE 5 MG PO TABS: 7.5000 mg | ORAL_TABLET | Freq: Every day | ORAL | 3 refills | Status: DC

## 2022-09-06 NOTE — Patient Instructions (Signed)
Medication Instructions:  Your physician has recommended you make the following change in your medication:   Increase Amlodipine to 7.5 mg daily   *If you need a refill on your cardiac medications before your next appointment, please call your pharmacy*   Lab Work: As scheduled today If you have labs (blood work) drawn today and your tests are completely normal, you will receive your results only by: [place] (if you have MyChart) OR A paper copy in the mail If you have any lab test that is abnormal or we need to change your treatment, we will call you to review the results.  Follow-Up: At [facility], you and your health needs are our priority.  As part of our continuing mission to provide you with exceptional heart care, we have created designated Provider Care Teams.  These Care Teams include your primary Cardiologist (physician) and Advanced Practice Providers (APPs -  Physician Assistants and Nurse Practitioners) who all work together to provide you with the care you need, when you need it.  We recommend signing up for the patient portal called "MyChart".  Sign up information is provided on this After Visit Summary.  MyChart is used to connect with patients for Virtual Visits (Telemedicine).  Patients are able to view lab/test results, encounter notes, upcoming appointments, etc.  Non-urgent messages can be sent to your provider as well.   To learn more about what you can do with MyChart, go to [email or web address].    Your next appointment:   As directed    Provider:   Fransico Him, MD     Other Instructions Check Blood pressure daily and call Dr. Radford Pax with results   Important Information About Sugar

## 2022-09-07 LAB — LIPID PANEL
Chol/HDL Ratio: 4.2 ratio (ref 0.0–4.4)
Cholesterol, Total: 179 mg/dL (ref 100–199)
HDL: 43 mg/dL (ref >39)
LDL Chol Calc (NIH): 109 mg/dL — ABNORMAL HIGH (ref 0–99)
Triglycerides: 155 mg/dL — ABNORMAL HIGH (ref 0–149)
VLDL Cholesterol Cal: 27 mg/dL (ref 5–40)

## 2022-09-07 LAB — COMPREHENSIVE METABOLIC PANEL
ALT: 28 IU/L (ref 0–32)
AST: 23 IU/L (ref 0–40)
Albumin/Globulin Ratio: 1.7 (ref 1.2–2.2)
Albumin: 4.5 g/dL (ref 3.8–4.8)
Alkaline Phosphatase: 89 IU/L (ref 44–121)
BUN/Creatinine Ratio: 14 (ref 12–28)
BUN: 16 mg/dL (ref 8–27)
Bilirubin Total: 0.4 mg/dL (ref 0.0–1.2)
CO2: 23 mmol/L (ref 20–29)
Calcium: 10 mg/dL (ref 8.7–10.3)
Chloride: 102 mmol/L (ref 96–106)
Creatinine, Ser: 1.13 mg/dL — ABNORMAL HIGH (ref 0.57–1.00)
Globulin, Total: 2.6 g/dL (ref 1.5–4.5)
Glucose: 203 mg/dL — ABNORMAL HIGH (ref 70–99)
Potassium: 4.4 mmol/L (ref 3.5–5.2)
Sodium: 141 mmol/L (ref 134–144)
Total Protein: 7.1 g/dL (ref 6.0–8.5)
eGFR: 50 mL/min/{1.73_m2} — ABNORMAL LOW (ref >59)

## 2022-09-08 ENCOUNTER — Ambulatory Visit (HOSPITAL_COMMUNITY): Payer: HMO

## 2022-09-09 ENCOUNTER — Telehealth (HOSPITAL_COMMUNITY): Payer: Self-pay

## 2022-09-09 NOTE — Telephone Encounter (Signed)
No response from pt.  Closed referral  

## 2022-09-10 ENCOUNTER — Ambulatory Visit (HOSPITAL_COMMUNITY): Payer: HMO

## 2022-09-10 ENCOUNTER — Ambulatory Visit: Payer: HMO | Admitting: Podiatry

## 2022-09-10 ENCOUNTER — Encounter: Payer: Self-pay | Admitting: Podiatry

## 2022-09-10 DIAGNOSIS — B351 Tinea unguium: Secondary | ICD-10-CM | POA: Diagnosis not present

## 2022-09-10 DIAGNOSIS — M79674 Pain in right toe(s): Secondary | ICD-10-CM | POA: Diagnosis not present

## 2022-09-10 DIAGNOSIS — M79675 Pain in left toe(s): Secondary | ICD-10-CM

## 2022-09-13 NOTE — Progress Notes (Signed)
Subjective:   Patient ID: Alice Cole, female   DOB: 76 y.o.   MRN: [id number]   HPI Patient has thick nailbeds that she cannot take care of herself and was concerned about a dark spot in the right great toenail after trauma   ROS      Objective:  Physical Exam  Elongated thickened yellow brittle nailbeds 1-5 both feet that are painful with a small spot on the right hallux     Assessment:  Chronic mycotic nail infection with pain 1-5 both feet     Plan:  H&P debrided nailbeds explained the spot is most likely dried blood secondary to her trauma and should be uneventful and patient will be seen back for routine care as needed or if any issues were to occur

## 2022-10-05 ENCOUNTER — Ambulatory Visit: Payer: HMO | Attending: Internal Medicine | Admitting: Pharmacist

## 2022-10-05 VITALS — BP 142/60 | HR 87

## 2022-10-05 DIAGNOSIS — E785 Hyperlipidemia, unspecified: Secondary | ICD-10-CM | POA: Diagnosis not present

## 2022-10-05 DIAGNOSIS — I10 Essential (primary) hypertension: Secondary | ICD-10-CM

## 2022-10-05 MED ORDER — PRAVASTATIN SODIUM 40 MG PO TABS: 40.0000 mg | ORAL_TABLET | Freq: Every evening | ORAL | 3 refills | Status: DC

## 2022-10-05 MED ORDER — AMLODIPINE BESYLATE 5 MG PO TABS: 5.0000 mg | ORAL_TABLET | Freq: Every day | ORAL | 3 refills | Status: DC

## 2022-10-05 MED ORDER — EZETIMIBE 10 MG PO TABS: 10.0000 mg | ORAL_TABLET | Freq: Every day | ORAL | 3 refills | Status: DC

## 2022-10-05 NOTE — Assessment & Plan Note (Signed)
Assessment: Amlodipine was increased to 7.5 mg at last appointment with Dr. Radford Pax.  Patient was supposed to report home blood pressure readings in 1 week but forgot. Blood pressure still above goal at home and in clinic Patient has been trying to increase activity video exercises walking when her legs allow  Plan: Will increase amlodipine to 10 mg daily.  Continue ramipril 10 mg twice a day and hydrochlorothiazide 25 mg daily Follow-up in blood pressure clinic February 1 Patient asked to bring her home blood pressure log and cuff with her to this appointment Reviewed proper blood pressure technique and provided a handout checking blood pressure and diet and exercise

## 2022-10-05 NOTE — Progress Notes (Signed)
Patient ID: Alice Cole                 DOB: [birth year]                    MRN: [id number]      HPI: Alice Cole is a 76 y.o. female patient referred to lipid clinic by Dr. Radford Pax. PMH is significant for NSTEMI 04/2022 s/p DES, PVC, DM, CKD, HTN, HLD, OSA and hx of DVT.  Patient reported leg pain with atorvastatin which she was started on after her non-STEMI so she was referred to the lipid clinic.  Patient has been off of the atorvastatin.  Her legs feel better.  She reports that she had itching to rosuvastatin although her allergy list states that she had muscle cramps.  Allergy list also states itching to fluvastatin.  Patient reports tolerating pravastatin for many years but says she was taken off of it at the time of her non-STEMI because it did not work well enough they told her.  It was replaced with atorvastatin.  Patient unable to complete cardiac rehab due to schedule.  She drives her son and grandchildren to work in school this day do not drive.  She has been set up up with someone from helping to manage through has provided her home exercises to do in someone who has been with her diet.  She reports that her blood pressure still high at home.  States that she has had some in the 130s yesterday was 751 systolic.  Blood pressure in clinic today is 142/60 and 138/58 on repeat.  Reviewed options for lowering LDL cholesterol, including ezetimibe, PCSK-9 inhibitors, bempedoic acid and inclisiran.  Discussed mechanisms of action, dosing, side effects and potential decreases in LDL cholesterol.  Also reviewed cost information and potential options for patient assistance.   Current Medications: none Intolerances: atorvastatin '80mg'$  (muscle aches), fluvastatin '20mg'$  (itching), atorvastatin '40mg'$ , rosuvastatin (itching/leg cramps)  Risk Factors: CAD, DM, HTN LDL-C goal: <55 ApoB goal: < 70  Diet: working with dietician through [facility]  Exercise: home workout videos from [employer], walking 10-15  min per day  Family History: The patient's family history includes Cancer in her daughter; Diabetes in an other family member; Heart attack (age of onset: 60) in her mother; Heart attack (age of onset: 64) in her maternal grandfather; Heart attack (age of onset: 3) in her maternal grandmother; Heart attack (age of onset: 16) in her brother; Heart disease in an other family member.   Social History:   Labs: Lipid Panel     Component Value Date/Time   CHOL 179 09/06/2022 0943   TRIG 155 (H) 09/06/2022 0943   HDL 43 09/06/2022 0943   CHOLHDL 4.2 09/06/2022 0943   CHOLHDL 4.5 05/02/2022 0351   VLDL 37 05/02/2022 0351   LDLCALC 109 (H) 09/06/2022 0943   LABVLDL 27 09/06/2022 0943    Past Medical History:  Diagnosis Date   Abnormal EKG 07/09/2016   Abnormal liver function    Adenomatous colon polyp    Arthritis    Bigeminy    CAD (coronary artery disease), native coronary artery    50% LCx, 95% lRCA and mild LAD disease s/p PCI RCA 04/2022   Colonic polyp    Dermatitis    Diabetes mellitus    Displaced fracture of right femoral neck ([place]) 11/24/2018   Diverticulitis    recurrent   DVT (deep venous thrombosis) (HCC)    Esophageal stricture  GERD (gastroesophageal reflux disease)    Hemorrhoids    Hyperlipidemia    Hypertension    OSA (obstructive sleep apnea) 07/09/2016   last PSG showed no significant OSA   PVC's (premature ventricular contractions)     Current Outpatient Medications on File Prior to Visit  Medication Sig Dispense Refill   acetaminophen (TYLENOL) 500 MG tablet Take 1,000 mg by mouth every 6 (six) hours as needed for moderate pain or headache.     alendronate (FOSAMAX) 70 MG tablet Take 70 mg by mouth once a week.     ALPRAZolam (XANAX) 0.25 MG tablet Take 0.25 mg by mouth daily as needed for anxiety.     aspirin 81 MG tablet Take 81 mg by mouth daily.     BD PEN NEEDLE NANO U/F 32G X 4 MM MISC      cholecalciferol (VITAMIN D3) 25 MCG (1000 UT) tablet  Take 1,000 Units by mouth daily.     clopidogrel (PLAVIX) 75 MG tablet Take 1 tablet (75 mg total) by mouth daily with breakfast. 90 tablet 3   hydrochlorothiazide (HYDRODIURIL) 25 MG tablet Take 25 mg by mouth daily.     JARDIANCE 10 MG TABS tablet      LANTUS SOLOSTAR 100 UNIT/ML Solostar Pen Inject 24 Units into the skin daily.     NOVOLOG FLEXPEN 100 UNIT/ML FlexPen Inject 38-50 Units into the skin 3 (three) times daily with meals. Take 40 units in the morning, Take 38 units at lunch, Take 50 units in the evening.     pantoprazole (PROTONIX) 40 MG tablet Take 1 tablet (40 mg total) by mouth daily. 30 tablet 2   ramipril (ALTACE) 10 MG capsule Take 1 capsule by mouth 2 (two) times daily.     No current facility-administered medications on file prior to visit.    Allergies  Allergen Reactions   Metoprolol Anaphylaxis   Nortriptyline Anaphylaxis    Other reaction(s): nightmares   Olmesartan Anaphylaxis    Other reaction(s): difficult urination, nausea   Tradjenta [Linagliptin] Shortness Of Breath   Alatrofloxacin Dermatitis   Atorvastatin Other (See Comments)    "Leg pain"   Beta Adrenergic Blockers Other (See Comments)    Reaction: Low Heart Rate   Cardizem [Diltiazem] Itching    Other reaction(s): sick  CANNOT TAKE CARDIZEM, DILTIAZEM OK   Cephalexin Diarrhea and Nausea And Vomiting   Codeine Other (See Comments)    tachycardia Other reaction(s): Palpitations   Colesevelam Hcl Nausea And Vomiting    Other reaction(s): sick to stomach   Crestor [Rosuvastatin Calcium] Other (See Comments)    Leg cramps   Flagyl [Metronidazole] Nausea Only   Fluvastatin Sodium Itching   Levemir [Insulin Detemir] Itching and Other (See Comments)    Bad mood   Lisinopril Nausea Only   Penicillin G Itching    Other reaction(s): itching   Rosuvastatin Other (See Comments)    REACTION: cramps Other reaction(s): leg cramps   Sulfamethoxazole-Trimethoprim Nausea Only and Other (See Comments)     REACTION: nausea, irreg. heartrate   Verapamil Swelling and Other (See Comments)    Chest pain   Adhesive [Tape] Rash    Band Aids   Sitagliptin Rash   Trovan [Trovafloxacin] Rash and Other (See Comments)    Assessment/Plan:  1. Hyperlipidemia -  Essential hypertension, benign Assessment: Amlodipine was increased to 7.5 mg at last appointment with Dr. Radford Pax.  Patient was supposed to report home blood pressure readings in 1 week but forgot. Blood  pressure still above goal at home and in clinic Patient has been trying to increase activity video exercises walking when her legs allow  Plan: Will increase amlodipine to 10 mg daily.  Continue ramipril 10 mg twice a day and hydrochlorothiazide 25 mg daily Follow-up in blood pressure clinic February 1 Patient asked to bring her home blood pressure log and cuff with her to this appointment Reviewed proper blood pressure technique and provided a handout checking blood pressure and diet and exercise  Hyperlipidemia Assessment: LDL-C is above goal of less than 55 Patient has been intolerant to atorvastatin and rosuvastatin and fluvastatin She reports tolerating pravastatin many years-but LDL was too high She has been working on increasing exercise Cost is a concern for patient and she would like to try the generic option first.  We did discuss possible grants that option if brand-name medicines are needed  Plan: Resume pravastatin 40 mg daily Add ezetimibe 10 mg daily Recheck cholesterol in 2 months along with LFTs If the LDL-C is above goal we will then discuss PCSK9 or Nexletol further   Thank you,  Ramond Dial, Pharm.D, BCPS, CPP [facility] of [facility] [address] [place], [place] [address]  Phone: [phone]; Fax: [phone]

## 2022-10-05 NOTE — Patient Instructions (Addendum)
Start taking pravastatin '40mg'$  daily and ezetimibe '10mg'$  daily Start taking amlodipine '10mg'$  daily Please start checking blood pressure once a day Please write these readings down and bring cuff and log to next appointment  Your blood pressure goal is <130/80  To check your pressure at home you will need to:  1. Sit up in a chair, with feet flat on the floor and back supported. Do not cross your ankles or legs. 2. Rest your left arm so that the cuff is about heart level. If the cuff goes on your upper arm,  then just relax the arm on the table, arm of the chair or your lap. If you have a wrist cuff, we  suggest relaxing your wrist against your chest (think of it as Pledging the Flag with the  wrong arm).  3. Place the cuff snugly around your arm, about 1 inch above the crook of your elbow. The  cords should be inside the groove of your elbow.  4. Sit quietly, with the cuff in place, for about 5 minutes. After that 5 minutes press the power  button to start a reading. 5. Do not talk or move while the reading is taking place.  6. Record your readings on a sheet of paper. Although most cuffs have a memory, it is often  easier to see a pattern developing when the numbers are all in front of you.  7. You can repeat the reading after 1-3 minutes if it is recommended  Make sure your bladder is empty and you have not had caffeine or tobacco within the last 30 min  Always bring your blood pressure log with you to your appointments. If you have not brought your monitor in to be double checked for accuracy, please bring it to your next appointment.  You can find a list of validated (accurate) blood pressure cuffs at [employer]it   Important lifestyle changes to control high blood pressure  Intervention  Effect on the BP  Lose extra pounds and watch your waistline Weight loss is one of the most effective lifestyle changes for controlling blood pressure. If you're overweight or obese, losing even  a small amount of weight can help reduce blood pressure. Blood pressure might go down by about 1 millimeter of mercury (mm Hg) with each kilogram (about 2.2 pounds) of weight lost.  Exercise regularly As a general goal, aim for at least 30 minutes of moderate physical activity every day. Regular physical activity can lower high blood pressure by about 5 to 8 mm Hg.  Eat a healthy diet Eating a diet rich in whole grains, fruits, vegetables, and low-fat dairy products and low in saturated fat and cholesterol. A healthy diet can lower high blood pressure by up to 11 mm Hg.  Reduce salt (sodium) in your diet Even a small reduction of sodium in the diet can improve heart health and reduce high blood pressure by about 5 to 6 mm Hg.  Limit alcohol One drink equals 12 ounces of beer, 5 ounces of wine, or 1.5 ounces of 80-proof liquor.  Limiting alcohol to less than one drink a day for women or two drinks a day for men can help lower blood pressure by about 4 mm Hg.   Please call me at [phone] with any questions.   Adopting a Healthy Lifestyle.   Weight: Know what a healthy weight is for you (roughly BMI <25) and aim to maintain this. You can calculate your body mass index on your  smart phone  Diet: Aim for 7+ servings of fruits and vegetables daily Limit animal fats in diet for cholesterol and heart health - choose grass fed whenever available Avoid highly processed foods (fast food burgers, tacos, fried chicken, pizza, hot dogs, french fries)  Saturated fat comes in the form of butter, lard, coconut oil, margarine, partially hydrogenated oils, and fat in meat. These increase your risk of cardiovascular disease.  Use healthy plant oils, such as olive, canola, soy, corn, sunflower and peanut.  Whole foods such as fruits, vegetables and whole grains have fiber  Men need > 38 grams of fiber per day Women need > 25 grams of fiber per day  Load up on vegetables and fruits - one-half of your plate:  Aim for color and variety, and remember that potatoes dont count. Go for whole grains - one-quarter of your plate: Whole wheat, barley, wheat berries, quinoa, oats, brown rice, and foods made with them. If you want pasta, go with whole wheat pasta. Protein power - one-quarter of your plate: Fish, chicken, beans, and nuts are all healthy, versatile protein sources. Limit red meat. You need carbohydrates for energy! The type of carbohydrate is more important than the amount. Choose carbohydrates such as vegetables, fruits, whole grains, beans, and nuts in the place of white rice, white pasta, potatoes (baked or fried), macaroni and cheese, cakes, cookies, and donuts.  If youre thirsty, drink water. Coffee and tea are good in moderation, but skip sugary drinks and limit milk and dairy products to one or two daily servings. Keep sugar intake at 6 teaspoons or 24 grams or LESS       Exercise: Aim for 150 min of moderate intensity exercise weekly for heart health, and weights twice weekly for bone health Stay active - any steps are better than no steps! Aim for 7-9 hours of sleep daily

## 2022-10-05 NOTE — Assessment & Plan Note (Signed)
Assessment: LDL-C is above goal of less than 55 Patient has been intolerant to atorvastatin and rosuvastatin and fluvastatin She reports tolerating pravastatin many years-but LDL was too high She has been working on increasing exercise Cost is a concern for patient and she would like to try the generic option first.  We did discuss possible grants that option if brand-name medicines are needed  Plan: Resume pravastatin 40 mg daily Add ezetimibe 10 mg daily Recheck cholesterol in 2 months along with LFTs If the LDL-C is above goal we will then discuss PCSK9 or Nexletol further

## 2022-10-06 ENCOUNTER — Ambulatory Visit: Payer: HMO

## 2022-10-14 ENCOUNTER — Telehealth: Payer: Self-pay | Admitting: Cardiology

## 2022-10-14 NOTE — Telephone Encounter (Signed)
Pt c/o medication issue:  1. Name of Medication:  amLODipine (NORVASC) 5 MG tablet   2. How are you currently taking this medication (dosage and times per day)?   3. Are you having a reaction (difficulty breathing--STAT)?   4. What is your medication issue?   Patient is requesting to speak with pharmacist to confirm medication instructions.

## 2022-10-14 NOTE — Telephone Encounter (Signed)
Per office visit note on 12/26: Plan: Will increase amlodipine to 10 mg daily.  Continue ramipril 10 mg twice a day and hydrochlorothiazide 25 mg daily Follow-up in blood pressure clinic February 1 Patient asked to bring her home blood pressure log and cuff with her to this appointment Reviewed proper blood pressure technique and provided a handout checking blood pressure and diet and exercis   Patient's medication list still lists amlodipine 5 mg daily. Will clarify with pharmacy.

## 2022-10-15 MED ORDER — AMLODIPINE BESYLATE 10 MG PO TABS: 10.0000 mg | ORAL_TABLET | Freq: Every day | ORAL | 3 refills | Status: DC

## 2022-10-15 NOTE — Telephone Encounter (Signed)
Yes, dose should be '10mg'$  daily. Looks like I sent the wrong dose in. The correct dose has been sent. I called and discussed with patient. She has been taking 2 of her '5mg'$  at home. Didn't discover it until she went to get it filled. Apologized for the error.

## 2022-11-11 ENCOUNTER — Ambulatory Visit: Payer: HMO

## 2022-11-22 ENCOUNTER — Telehealth: Payer: Self-pay | Admitting: Pharmacist

## 2022-11-22 DIAGNOSIS — G4733 Obstructive sleep apnea (adult) (pediatric): Secondary | ICD-10-CM

## 2022-11-22 NOTE — Telephone Encounter (Signed)
Called pt to set up BP f/u apt and schedule lipid labs. LVM for pt to call back

## 2022-12-02 ENCOUNTER — Encounter: Payer: Self-pay | Admitting: Family Medicine

## 2022-12-02 ENCOUNTER — Other Ambulatory Visit: Payer: Self-pay | Admitting: Family Medicine

## 2022-12-02 DIAGNOSIS — R1084 Generalized abdominal pain: Secondary | ICD-10-CM | POA: Diagnosis not present

## 2022-12-02 DIAGNOSIS — M8580 Other specified disorders of bone density and structure, unspecified site: Secondary | ICD-10-CM

## 2022-12-02 DIAGNOSIS — Z1231 Encounter for screening mammogram for malignant neoplasm of breast: Secondary | ICD-10-CM

## 2022-12-02 DIAGNOSIS — R140 Abdominal distension (gaseous): Secondary | ICD-10-CM | POA: Diagnosis not present

## 2022-12-06 DIAGNOSIS — R140 Abdominal distension (gaseous): Secondary | ICD-10-CM | POA: Diagnosis not present

## 2022-12-13 NOTE — Telephone Encounter (Signed)
I have renewed the Rx as requested by the patient and sent to [facility] via community message.

## 2022-12-16 ENCOUNTER — Telehealth: Payer: Self-pay | Admitting: Cardiology

## 2022-12-16 NOTE — Telephone Encounter (Signed)
Patient states she was advised to ask for a new CPAP order.

## 2022-12-17 NOTE — Telephone Encounter (Signed)
Left a message her cpap has been ordered.

## 2023-01-11 ENCOUNTER — Ambulatory Visit: Payer: HMO

## 2023-01-18 ENCOUNTER — Inpatient Hospital Stay: Admission: RE | Admit: 2023-01-18 | Payer: HMO | Source: Ambulatory Visit

## 2023-01-18 ENCOUNTER — Other Ambulatory Visit: Payer: Self-pay | Admitting: Family Medicine

## 2023-01-18 DIAGNOSIS — M8580 Other specified disorders of bone density and structure, unspecified site: Secondary | ICD-10-CM

## 2023-02-25 ENCOUNTER — Ambulatory Visit
Admission: RE | Admit: 2023-02-25 | Discharge: 2023-02-25 | Disposition: A | Discharge status: Home / Self Care | Payer: HMO | Source: Ambulatory Visit | Attending: Family Medicine | Admitting: Family Medicine

## 2023-02-25 DIAGNOSIS — Z1231 Encounter for screening mammogram for malignant neoplasm of breast: Secondary | ICD-10-CM

## 2023-04-07 ENCOUNTER — Telehealth: Payer: Self-pay

## 2023-04-07 NOTE — Telephone Encounter (Signed)
Reviewed results and recommendations with patient. Patient verbalizes understanding of lipids not quite at goal, but after discussion with pharmacist Dr. Mayford Knife recommends she stay on her current medical therapy.

## 2023-04-07 NOTE — Telephone Encounter (Signed)
-----   Message from Quintella Reichert, MD sent at 04/07/2023  7:57 AM EDT ----- Yes that is fine ----- Message ----- From: Luellen Pucker, RN Sent: 04/06/2023   6:12 PM EDT To: Quintella Reichert, MD  Does that work for you? ----- Message ----- From: Olene Floss, RPH-CPP Sent: 04/06/2023   4:46 PM EDT To: Quintella Reichert, MD; Frutoso Schatz, RN; #  Patient's LDL-C is 63 but her non-HDL is 86 which is a better predictor. She is very close to non-HDL goal of <85 and pretty close to <55. She expressed concern about cost of brand medications. I would recommend continuing pravastatin and ezetimibe.   ----- Message ----- From: Frutoso Schatz, RN Sent: 04/06/2023   2:35 PM EDT To: Luellen Pucker, RN; [facility] Pharm[facility]   ----- Message ----- From: Quintella Reichert, MD Sent: 04/06/2023   2:27 PM EDT To: Frutoso Schatz, RN  Lipids still not at goal.  Please forward to lipid clinic for further recommendations.

## 2023-04-19 ENCOUNTER — Telehealth: Payer: Self-pay | Admitting: Cardiology

## 2023-04-19 NOTE — Telephone Encounter (Signed)
Pt would like a callback regarding whether it is okay for her to have x- ray's done at the Dentist. Please advise

## 2023-04-19 NOTE — Telephone Encounter (Signed)
Patient calling in and states she has a new dentist. Tomorrow she is having XRAYS done as she will soon be having all her bottom teeth removed. She is asking if she needs antibiotics for either of these procedures and is also asking if she needs medical clearance to have her bottom teeth removed. Patient states she has been prescribed amoxicillin in the past for dental procedures but she does not remember who prescribed it for her.  Advised patient that I can't find any record of Dr. Mayford Knife having prescribed antibiotics for her. Provided education on pre-op clearance procedures. Patient verbalizes understanding, forwarded patient's inquiry to Dr. Mayford Knife.

## 2023-04-19 NOTE — Telephone Encounter (Signed)
Called, no answer, left detailed message per DPR that patient does not need antbiotic prophylaxis for dental work due to her cardiac history. Advised patient to call previous prescriber or dentist to find out if she is needing antibiotics for another reason.

## 2023-05-10 ENCOUNTER — Encounter: Payer: Self-pay | Admitting: Cardiology

## 2023-05-10 ENCOUNTER — Ambulatory Visit: Payer: PPO | Attending: Cardiology | Admitting: Cardiology

## 2023-05-10 VITALS — BP 144/52 | HR 84 | Ht 67.0 in | Wt 207.0 lb

## 2023-05-10 DIAGNOSIS — E782 Mixed hyperlipidemia: Secondary | ICD-10-CM | POA: Diagnosis not present

## 2023-05-10 DIAGNOSIS — I2510 Atherosclerotic heart disease of native coronary artery without angina pectoris: Secondary | ICD-10-CM

## 2023-05-10 DIAGNOSIS — Z794 Long term (current) use of insulin: Secondary | ICD-10-CM

## 2023-05-10 DIAGNOSIS — I10 Essential (primary) hypertension: Secondary | ICD-10-CM | POA: Diagnosis not present

## 2023-05-10 DIAGNOSIS — E1122 Type 2 diabetes mellitus with diabetic chronic kidney disease: Secondary | ICD-10-CM

## 2023-05-10 DIAGNOSIS — N1831 Chronic kidney disease, stage 3a: Secondary | ICD-10-CM

## 2023-05-10 DIAGNOSIS — I951 Orthostatic hypotension: Secondary | ICD-10-CM | POA: Diagnosis not present

## 2023-05-10 DIAGNOSIS — I441 Atrioventricular block, second degree: Secondary | ICD-10-CM

## 2023-05-10 DIAGNOSIS — R0989 Other specified symptoms and signs involving the circulatory and respiratory systems: Secondary | ICD-10-CM

## 2023-05-10 DIAGNOSIS — G4733 Obstructive sleep apnea (adult) (pediatric): Secondary | ICD-10-CM

## 2023-05-10 NOTE — Addendum Note (Signed)
Addended by: Luellen Pucker on: 05/10/2023 01:31 PM   Modules accepted: Orders

## 2023-05-10 NOTE — Progress Notes (Signed)
Cardiology Office Note:    Date:  05/10/2023   ID:  SHAWNDA DOVEL, DOB Apr 27, 1946, MRN [id number]  PCP:  [facility]  [facility] Providers Cardiologist:  Armanda Magic, MD     Referring MD: [facility]   Chief Complaint:  Coronary Artery Disease, Hypertension, Hyperlipidemia, and Sleep Apnea    Patient Profile: Coronary artery disease  NSTEMI 04/2022 s/p 3 x 16 mm DES to pRCA Allergy to beta-blocker  PVCs Intermittent/Nocturnal heart block Eval by EP (Cam[place]itz) in 5/23 - no indication for pacer  Diabetes mellitus  Chronic kidney disease  Hypertension  Hyperlipidemia  GERD OSA on CPAP  Hx of DVT  Prior CV Studies: NON-TELEMETRY MONITORING HOOKUP AND INTERP 05/31/2022   Predominant rhythm was normal sinus rhythm with average heart rate 86bpm and ranged from 35 to 136bpm.   Isolated PACs, atrial couplest and atrial triplets.   Isolated PVCs, ventricular couplets and triplets   Transient second degree AV block both Mobitz 1 and 2:1 block  LEFT HEART CATH 05/03/2022 LAD mild diffuse calcific plaquing LCx 50 (RFR 0.96) RCA ostial 95 PCI: 3 x 16 mm Synergy DES to the RCA   ECHO COMPLETE WO IMAGING ENHANCING AGENT 05/02/2022 EF 65-70, no RWMA, GR 1 DD, normal RVSF  LONG TERM MONITOR (8-14 DAYS) HOOK UP AND INTERPRETATION 02/12/2022  Predominant rhythm was normal sinus rhythm with an average heart rate of 76 bpm and ranged from 49 to 127 bpm.  Rare PACs  Rare PVCs, ventricular couplets and triplets as well as ventricular bigeminy and trigeminy. PVC load less than 1%.  Occasional accelerated junctional rhythm  Intermittent complete heart block occurring during sleep at 49 bpm lasting up to 7.9 seconds in duration with ventricular escape rhythm  Dr. Lynnette Caffey who is DOD in the office was notified and monitor reviewed. Patient is to be set up for EP evaluation for pacemaker and go to the ER for any symptoms.  GATED SPECT MYO PERF  W/LEXISCAN STRESS 1D 04/01/2021 EF 76, normal perfusion, low risk  CPET 03/22/17 Conclusion: Exercise testing with gas exchange demonstrates low-normal functional capacity when compared to matched sedentary norms. There is no indication of pulmonary limitation or exercise-induced bronchospasm. Given elevated VE/VCO2 and early plateau of O2 pulse, circulatory limitations cannot be excluded. At peak exercise, with adjustments to predicted weight values, patient appears primarily limited due to her body habitus and deconditioning.   CARDIAC TELEMETRY MONITORING-INTERPRETATION ONLY 07/14/2016  Normal Sinus Rhythm with heart rate 73-98bpm  Occasional PVCs and ventricular escape beats.  Occasional accelerated junction rhythm  Transient type I second degree AV block during sleep  CAROTID US 08/02/12 Calcified   bilateral carotid bifurcation and proximal ICA  plaque, resulting in less than 50% diameter stenosis. The exam does  not exclude plaque ulceration or embolization.  Continued  surveillance recommended.   History of Present Illness:   Alice Cole is a 77 y.o. female with the above problem list.  She was admitted 7/22-7/25/2023 with a NSTEMI. Cardiac catheterization demonstrated high grade disease in the pRCA which was tx with a DES. EF is preserved on echocardiogram She was noted to have intermittent 2nd degree HB Type I. Her diltiazem was DC'd. An OP monitor was ordered and demonstrated transient Wenckebach.  Seen by EP and recommended watching as episode was nocturnal and was encouraged to use CPAP. She was approved for a new device but never got her CPAP so she has not used her old  one in a long time.   At her last OV she said that Since her PCI she says that her breathing had significantly improved. She had never gotten her CPAP that was ordered. I ordered auto CPAP from 5 to 15cm H2O and she is now back for followup.    She is here today for followup and is doing well.  She denies any  chest pain or pressure, SOB, DOE, PND, orthopnea, LE edema, dizziness, palpitations or syncope. She is compliant with her meds and is tolerating meds with no SE.    She is doing well with her PAP device and thinks that she has gotten used to it.  She tolerates the mask and feels the pressure is adequate.  Since going on PAP she feels rested in the am and has no significant daytime sleepiness.  She denies any significant mouth or nasal dryness or nasal congestion.  She does not think that he snores.    Past Medical History:  Diagnosis Date   Abnormal EKG 07/09/2016   Abnormal liver function    Adenomatous colon polyp    Arthritis    Bigeminy    CAD (coronary artery disease), native coronary artery    50% LCx, 95% lRCA and mild LAD disease s/p PCI RCA 04/2022   Colonic polyp    Dermatitis    Diabetes mellitus    Displaced fracture of right femoral neck (HCC) 11/24/2018   Diverticulitis    recurrent   DVT (deep venous thrombosis) (HCC)    Esophageal stricture    GERD (gastroesophageal reflux disease)    Hemorrhoids    Hyperlipidemia    Hypertension    OSA (obstructive sleep apnea) 07/09/2016   last PSG showed no significant OSA   PVC's (premature ventricular contractions)    Current Medications: Current Meds  Medication Sig   acetaminophen (TYLENOL) 500 MG tablet Take 1,000 mg by mouth every 6 (six) hours as needed for moderate pain or headache.   alendronate (FOSAMAX) 70 MG tablet Take 70 mg by mouth once a week.   ALPRAZolam (XANAX) 0.25 MG tablet Take 0.25 mg by mouth daily as needed for anxiety.   amLODipine (NORVASC) 10 MG tablet Take 1 tablet (10 mg total) by mouth daily.   aspirin 81 MG tablet Take 81 mg by mouth daily.   BD PEN NEEDLE NANO U/F 32G X 4 MM MISC    cholecalciferol (VITAMIN D3) 25 MCG (1000 UT) tablet Take 1,000 Units by mouth daily.   clopidogrel (PLAVIX) 75 MG tablet Take 1 tablet (75 mg total) by mouth daily with breakfast.   ezetimibe (ZETIA) 10 MG tablet  Take 1 tablet (10 mg total) by mouth daily.   hydrochlorothiazide (HYDRODIURIL) 25 MG tablet Take 25 mg by mouth daily.   LANTUS SOLOSTAR 100 UNIT/ML Solostar Pen Inject 24 Units into the skin daily.   NOVOLOG FLEXPEN 100 UNIT/ML FlexPen Inject 38-50 Units into the skin 3 (three) times daily with meals. Take 40 units in the morning, Take 38 units at lunch, Take 50 units in the evening.   pantoprazole (PROTONIX) 40 MG tablet Take 1 tablet (40 mg total) by mouth daily.   pravastatin (PRAVACHOL) 40 MG tablet Take 1 tablet (40 mg total) by mouth every evening.   ramipril (ALTACE) 10 MG capsule Take 1 capsule by mouth 2 (two) times daily.    Allergies:   Metoprolol, Nortriptyline, Olmesartan, Tradjenta [linagliptin], Alatrofloxacin, Atorvastatin, Beta adrenergic blockers, Cardizem [diltiazem], Cephalexin, Codeine, Colesevelam hcl, Crestor [rosuvastatin calcium],  Flagyl [metronidazole], Fluvastatin sodium, Levemir [insulin detemir], Lisinopril, Penicillin g, Rosuvastatin, Sulfamethoxazole-trimethoprim, Verapamil, Adhesive [tape], Sitagliptin, and Trovan [trovafloxacin]   Social History   Tobacco Use   Smoking status: Never   Smokeless tobacco: Never  Vaping Use   Vaping status: Never Used  Substance Use Topics   Alcohol use: No    Comment: used to drink 30 yrs ago   Drug use: No    Family Hx: The patient's family history includes Cancer in her daughter; Diabetes in an other family member; Heart attack (age of onset: 44) in her mother; Heart attack (age of onset: 49) in her maternal grandfather; Heart attack (age of onset: 24) in her maternal grandmother; Heart attack (age of onset: 67) in her brother; Heart disease in an other family member.  Review of Systems  Gastrointestinal:  Negative for hematochezia.  Genitourinary:  Negative for hematuria.     EKGs/Labs/Other Test Reviewed:    EKG:  EKG is not ordered today.   Recent Labs: 06/23/2022: Hemoglobin 13.3; Platelets 245 09/06/2022: ALT  28; BUN 16; Creatinine, Ser 1.13; Potassium 4.4; Sodium 141   Recent Lipid Panel Recent Labs    09/06/22 0943  CHOL 179  TRIG 155*  HDL 43  LDLCALC 109*     Risk Assessment/Calculations/Metrics:     STOP-Bang Score:           Physical Exam:    VS:  BP (!) 144/52   Pulse 84   Ht 5\' 7"  (1.702 m)   Wt 207 lb (93.9 kg)   SpO2 94%   BMI 32.42 kg/m     Wt Readings from Last 3 Encounters:  05/10/23 207 lb (93.9 kg)  09/06/22 211 lb (95.7 kg)  06/23/22 207 lb 12.8 oz (94.3 kg)   GEN: Well nourished, well developed in no acute distress HEENT: Normal NECK: No JVD; No carotid bruits LYMPHATICS: No lymphadenopathy CARDIAC:RRR, no murmurs, rubs, gallops RESPIRATORY:  Clear to auscultation without rales, wheezing or rhonchi  ABDOMEN: Soft, non-tender, non-distended MUSCULOSKELETAL:  No edema; No deformity  SKIN: Warm and dry NEUROLOGIC:  Alert and oriented x 3 PSYCHIATRIC:  Normal affect   ASSESSMENT & PLAN:       Coronary artery disease involving native coronary artery of native heart without angina pectoris -Status post non-STEMI in July 2023.   -she denies any anginal symptoms since I saw her last -continue prescription drug management with ASA 81mg  daily, Plavix 75mg  daily and Pravastatin with PRN refills -no BB due to hx of transient heart block   Orthostatic hypotension -she denies any dizziness or further syncope -I encouraged her to wear her compression hose during the day   Essential hypertension, benign -BP controlled on exam today -continue prescription drug management with hydrochlorothiazide 25mg  daily, Altace 10mg  BID, Amlodipine 10mg  daily with PRN refills -I have personally reviewed and interpreted outside labs performed by patient's PCP which showed SCr 1.12 and K+ 4.3 on 04/05/2023  Mixed hyperlipidemia -LDL goal < 70 -I have personally reviewed and interpreted outside labs performed by patient's PCP which showed LDL 63 and HDL 43 on 04/05/2023 -she  stopped taking the atorvastatin due to severe muscle aches -continue prescription drug management with Zetia 10mg  daily and Pravastatin 40mg  daily with PRN refills   Type 2 diabetes mellitus with chronic kidney disease, with long-term current use of insulin (HCC) -Followed by endocrinologist (Dr. Sharl Ma)    Mobitz type 1 second degree AV block She has had a history of transient complete heart block  during sleep.  She had some Mobitz 1 noted in the hospital.  She is now off of diltiazem.  She did have some episodes of Mobitz 1 on her monitor as well as 2-1 block.  -She has not had syncope or near syncope.   -She knows to contact us if she has any significant dizziness or syncope  OSA  -she had a repeat HST 03/2022 showing mild OSA with AHI 6.9/hr and no central events - The patient is tolerating PAP therapy well without any problems. The PAP download performed by his DME was personally reviewed and interpreted by me today and showed an AHI of 0.3/hr on auto CPAP from 5-15 cm H2O with 43% compliance in using more than 4 hours nightly.  The patient has been using and benefiting from PAP use and will continue to benefit from therapy.  -I encouraged her to be more compliant with her device  Left carotid bruit -check carotid dopplers  Followup with me in 1 year  Medication Adjustments/Labs and Tests Ordered: Current medicines are reviewed at length with the patient today.  Concerns regarding medicines are outlined above.  Tests Ordered: No orders of the defined types were placed in this encounter.  Medication Changes: No orders of the defined types were placed in this encounter.  Signed, Armanda Magic, MD  05/10/2023 1:23 PM    [facility] [address] [place], [place], [place]  [address] Phone: [phone]; Fax: [phone]

## 2023-05-10 NOTE — Patient Instructions (Signed)
Medication Instructions:  Your physician recommends that you continue on your current medications as directed. Please refer to the Current Medication list given to you today.  *If you need a refill on your cardiac medications before your next appointment, please call your pharmacy*   Lab Work: None.  If you have labs (blood work) drawn today and your tests are completely normal, you will receive your results only by: [place] (if you have MyChart) OR A paper copy in the mail If you have any lab test that is abnormal or we need to change your treatment, we will call you to review the results.   Testing/Procedures: Your physician has requested that you have a carotid duplex. This test is an ultrasound of the carotid arteries in your neck. It looks at blood flow through these arteries that supply the brain with blood. Allow one hour for this exam. There are no restrictions or special instructions.    Follow-Up: At [facility], you and your health needs are our priority.  As part of our continuing mission to provide you with exceptional heart care, we have created designated Provider Care Teams.  These Care Teams include your primary Cardiologist (physician) and Advanced Practice Providers (APPs -  Physician Assistants and Nurse Practitioners) who all work together to provide you with the care you need, when you need it.  We recommend signing up for the patient portal called "MyChart".  Sign up information is provided on this After Visit Summary.  MyChart is used to connect with patients for Virtual Visits (Telemedicine).  Patients are able to view lab/test results, encounter notes, upcoming appointments, etc.  Non-urgent messages can be sent to your provider as well.   To learn more about what you can do with MyChart, go to [email or web address].    Your next appointment:   1 year(s)  Provider:   Fransico Him, MD

## 2023-05-13 ENCOUNTER — Encounter: Payer: Self-pay | Admitting: Cardiology

## 2023-05-13 ENCOUNTER — Ambulatory Visit (HOSPITAL_COMMUNITY)
Admission: RE | Admit: 2023-05-13 | Discharge: 2023-05-13 | Disposition: A | Discharge status: Home / Self Care | Payer: PPO | Source: Ambulatory Visit | Attending: Cardiovascular Disease | Admitting: Cardiovascular Disease

## 2023-05-13 DIAGNOSIS — R0989 Other specified symptoms and signs involving the circulatory and respiratory systems: Secondary | ICD-10-CM | POA: Insufficient documentation

## 2023-05-19 ENCOUNTER — Telehealth: Payer: Self-pay

## 2023-05-19 ENCOUNTER — Telehealth: Payer: Self-pay | Admitting: Cardiology

## 2023-05-19 NOTE — Telephone Encounter (Signed)
-----   Message from Armanda Magic sent at 05/13/2023  1:12 PM EDT ----- Carotid Doppler showed 1 to 39% bilateral carotid artery stenosis and right subclavian artery stenosis.  Please find out if she has any right arm weakness, numbness or pain in the right arm

## 2023-05-19 NOTE — Telephone Encounter (Signed)
Called to discuss carotid doppler results, no answer. Left detailed message per DPR asking patient to call our office.

## 2023-05-19 NOTE — Telephone Encounter (Signed)
Patient is returning phone call.  °

## 2023-05-20 ENCOUNTER — Telehealth: Payer: Self-pay

## 2023-05-20 DIAGNOSIS — I771 Stricture of artery: Secondary | ICD-10-CM

## 2023-05-20 NOTE — Telephone Encounter (Signed)
Reviewed with patient that carotid doppler shows 1-39 % bilateral carotid artery stenosis and right subclavian artery stenosis. Patient reports that she has had 2-3 episodes this year where she felt like her right arm "fell asleep" and was tingling. She states the last episode was about a month ago. Patient responses forwarded to Dr. Mayford Knife.

## 2023-05-20 NOTE — Telephone Encounter (Signed)
Patient agrees to referral to Dr. Clotilde Dieter for vascular surgery eval, order placed.

## 2023-05-20 NOTE — Telephone Encounter (Signed)
-----   Message from Alice Cole sent at 05/13/2023  1:12 PM EDT ----- Carotid Doppler showed 1 to 39% bilateral carotid artery stenosis and right subclavian artery stenosis.  Please find out if she has any right arm weakness, numbness or pain in the right arm

## 2023-05-20 NOTE — Telephone Encounter (Signed)
Patient is following up, requesting to discuss echo results.

## 2023-05-20 NOTE — Addendum Note (Signed)
Addended by: Luellen Pucker on: 05/20/2023 03:30 PM   Modules accepted: Orders

## 2023-05-25 ENCOUNTER — Other Ambulatory Visit: Payer: Self-pay | Admitting: Physician Assistant

## 2023-06-23 ENCOUNTER — Encounter: Payer: Self-pay | Admitting: Vascular Surgery

## 2023-06-23 ENCOUNTER — Ambulatory Visit: Payer: PPO | Admitting: Vascular Surgery

## 2023-06-23 VITALS — BP 133/72 | HR 85 | Temp 98.3°F | Resp 18 | Ht 67.0 in | Wt 210.8 lb

## 2023-06-23 DIAGNOSIS — I6523 Occlusion and stenosis of bilateral carotid arteries: Secondary | ICD-10-CM

## 2023-06-23 DIAGNOSIS — I771 Stricture of artery: Secondary | ICD-10-CM

## 2023-06-23 NOTE — Progress Notes (Signed)
Patient ID: Alice Cole, female   DOB: 01/05/1946, 77 y.o.   MRN: [id number]  Reason for Consult: New Patient (Initial Visit) (Referred by Dr. Mayford Knife subclavian artery stenosis )   Referred by Quintella Reichert, MD  Subjective:     HPI:  Alice Cole is a 77 y.o. 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  She is a lifelong non-smoker although her husband was a smoker.  She has diabetes and coronary artery disease.  A bruit was discovered in her neck ultimately she underwent carotid duplex and was found to have high-grade stenosis of the right subclavian artery.  She states that she does occasionally numbness in the right arm but this is infrequent she does not have weakness in the right arm there is never had any other symptoms.  She denies any history of stroke, TIA or amaurosis.  Past Medical History:  Diagnosis Date   Abnormal EKG 07/09/2016   Abnormal liver function    Adenomatous colon polyp    Arthritis    Bigeminy    CAD (coronary artery disease), native coronary artery    50% LCx, 95% lRCA and mild LAD disease s/p PCI RCA 04/2022   Carotid artery disease (HCC)    /   Colonic polyp    Dermatitis    Diabetes mellitus    Displaced fracture of right femoral neck (HCC) 11/24/2018   Diverticulitis    recurrent   DVT (deep venous thrombosis) (HCC)    Esophageal stricture    GERD (gastroesophageal reflux disease)    Hemorrhoids    Hyperlipidemia    Hypertension    OSA (obstructive sleep apnea) 07/09/2016   last PSG showed no significant OSA   PVC's (premature ventricular contractions)    Family History  Problem Relation Age of Onset   Heart attack Mother 53   Heart attack Brother 36   Heart attack Maternal Grandmother 67   Heart attack Maternal Grandfather 55   Cancer Daughter        Ovarian Cancer   Diabetes Other        Grandparent   Heart disease Other        Grandparent   Past Surgical  History:  Procedure Laterality Date   ABDOMINAL HYSTERECTOMY     CHOLECYSTECTOMY     aprox 10 years ago   CORONARY PRESSURE/FFR STUDY N/A 05/03/2022   Procedure: INTRAVASCULAR PRESSURE WIRE/FFR STUDY;  Surgeon: Tonny Bollman, MD;  Location: [facility] INVASIVE CV LAB;  Service: Cardiovascular;  Laterality: N/A;   CORONARY STENT INTERVENTION N/A 05/03/2022   Procedure: CORONARY STENT INTERVENTION;  Surgeon: Tonny Bollman, MD;  Location: [facility] INVASIVE CV LAB;  Service: Cardiovascular;  Laterality: N/A;   EYE SURGERY     right and left eye   KNEE SURGERY     Right   KNEE SURGERY     Left   LEFT HEART CATH AND CORONARY ANGIOGRAPHY N/A 05/03/2022   Procedure: LEFT HEART CATH AND CORONARY ANGIOGRAPHY;  Surgeon: Tonny Bollman, MD;  Location: [facility] INVASIVE CV LAB;  Service: Cardiovascular;  Laterality: N/A;   TOTAL HIP ARTHROPLASTY Right 11/24/2018   Procedure: TOTAL HIP ARTHROPLASTY ANTERIOR APPROACH;  Surgeon: Jodi Geralds, MD;  Location: [facility] ORS;  Service: Orthopedics;  Laterality: Right;    Short Social History:  Social History   Tobacco Use   Smoking status: Never   Smokeless tobacco: Never  Substance Use Topics  Alcohol use: No    Comment: used to drink 30 yrs ago    Allergies  Allergen Reactions   Metoprolol Anaphylaxis   Nortriptyline Anaphylaxis    Other reaction(s): nightmares   Olmesartan Anaphylaxis    Other reaction(s): difficult urination, nausea   Tradjenta [Linagliptin] Shortness Of Breath   Alatrofloxacin Dermatitis   Atorvastatin Other (See Comments)    "Leg pain"   Beta Adrenergic Blockers Other (See Comments)    Reaction: Low Heart Rate   Cardizem [Diltiazem] Itching    Other reaction(s): sick  CANNOT TAKE CARDIZEM, DILTIAZEM OK   Cephalexin Diarrhea and Nausea And Vomiting   Codeine Other (See Comments)    tachycardia Other reaction(s): Palpitations   Colesevelam Hcl Nausea And Vomiting    Other reaction(s): sick to stomach   Crestor [Rosuvastatin Calcium]  Other (See Comments)    Leg cramps   Flagyl [Metronidazole] Nausea Only   Fluvastatin Sodium Itching   Levemir [Insulin Detemir] Itching and Other (See Comments)    Bad mood   Lisinopril Nausea Only   Penicillin G Itching    Other reaction(s): itching   Rosuvastatin Other (See Comments)    REACTION: cramps Other reaction(s): leg cramps   Sulfamethoxazole-Trimethoprim Nausea Only and Other (See Comments)    REACTION: nausea, irreg. heartrate   Verapamil Swelling and Other (See Comments)    Chest pain   Adhesive [Tape] Rash    Band Aids   Sitagliptin Rash   Trovan [Trovafloxacin] Rash and Other (See Comments)    Current Outpatient Medications  Medication Sig Dispense Refill   acetaminophen (TYLENOL) 500 MG tablet Take 1,000 mg by mouth every 6 (six) hours as needed for moderate pain or headache.     ALPRAZolam (XANAX) 0.25 MG tablet Take 0.25 mg by mouth daily as needed for anxiety.     amLODipine (NORVASC) 10 MG tablet Take 1 tablet (10 mg total) by mouth daily. 90 tablet 3   aspirin 81 MG tablet Take 81 mg by mouth daily.     BD PEN NEEDLE NANO U/F 32G X 4 MM MISC      cholecalciferol (VITAMIN D3) 25 MCG (1000 UT) tablet Take 1,000 Units by mouth daily.     clopidogrel (PLAVIX) 75 MG tablet TAKE 1 TABLET BY MOUTH DAILY WITH BREAKFAST. 90 tablet 3   ezetimibe (ZETIA) 10 MG tablet Take 1 tablet (10 mg total) by mouth daily. 90 tablet 3   hydrochlorothiazide (HYDRODIURIL) 25 MG tablet Take 25 mg by mouth daily.     LANTUS SOLOSTAR 100 UNIT/ML Solostar Pen Inject 24 Units into the skin daily.     NOVOLOG FLEXPEN 100 UNIT/ML FlexPen Inject 38-50 Units into the skin 3 (three) times daily with meals. Take 40 units in the morning, Take 38 units at lunch, Take 50 units in the evening.     pantoprazole (PROTONIX) 40 MG tablet Take 1 tablet (40 mg total) by mouth daily. 30 tablet 2   pravastatin (PRAVACHOL) 40 MG tablet Take 1 tablet (40 mg total) by mouth every evening. 90 tablet 3    ramipril (ALTACE) 10 MG capsule Take 1 capsule by mouth 2 (two) times daily.     alendronate (FOSAMAX) 70 MG tablet Take 70 mg by mouth once a week. (Patient not taking: Reported on 06/23/2023)     No current facility-administered medications for this visit.    Review of Systems  Constitutional:  Constitutional negative. HENT: HENT negative.  Eyes: Eyes negative.  Respiratory: Respiratory negative.  Cardiovascular: Cardiovascular negative.  GI: Gastrointestinal negative.  Musculoskeletal: Positive for leg pain.  Neurological: Positive for dizziness and numbness.  Hematologic: Hematologic/lymphatic negative.  Psychiatric: Psychiatric negative.        Objective:  Objective   Vitals:   06/23/23 1406 06/23/23 1408  BP: (!) 146/78 133/72  Pulse: 85   Resp: 18   Temp: 98.3 F (36.8 C)   TempSrc: Temporal   SpO2: 94%   Weight: 210 lb 12.8 oz (95.6 kg)   Height: 5\' 7"  (1.702 m)    Body mass index is 33.02 kg/m.  Physical Exam HENT:     Head: Normocephalic.     Nose: Nose normal.  Eyes:     Pupils: Pupils are equal, round, and reactive to light.  Neck:     Vascular: Carotid bruit present.     Comments: Bilateral carotid and right supraclavicular bruits Cardiovascular:     Rate and Rhythm: Normal rate.     Pulses: Normal pulses.  Pulmonary:     Effort: Pulmonary effort is normal.  Abdominal:     General: Abdomen is flat.  Musculoskeletal:        General: Normal range of motion.     Cervical back: Neck supple.     Right lower leg: No edema.     Left lower leg: No edema.  Skin:    General: Skin is warm.     Capillary Refill: Capillary refill takes less than 2 seconds.  Neurological:     General: No focal deficit present.     Mental Status: She is alert.  Psychiatric:        Mood and Affect: Mood normal.     Data: Right Carotid Findings:  +----------+--------+--------+--------+--------------------------+--------+            PSV cm/sEDV  cm/sStenosisPlaque Description         Comments  +----------+--------+--------+--------+--------------------------+--------+   CCA Prox  45      7                                                    +----------+--------+--------+--------+--------------------------+--------+   CCA Distal116     14      <50%    heterogenous                         +----------+--------+--------+--------+--------------------------+--------+   ICA Prox  169     17      1-39%   heterogenous and irregular           +----------+--------+--------+--------+--------------------------+--------+   ICA Distal88      16                                                   +----------+--------+--------+--------+--------------------------+--------+   ECA      311     11      >50%    heterogenous and irregular           +----------+--------+--------+--------+--------------------------+--------+    +----------+--------+-------+----------------------+-------------------+           PSV cm/sEDV cmsDescribe              Arm Pressure (mmHG)  +----------+--------+-------+----------------------+-------------------+  [id number]  Stenotic and Turbulent138                  +----------+--------+-------+----------------------+-------------------+   +---------+--------+--+--------+-+---------+  VertebralPSV cm/s44EDV cm/s7Antegrade  +---------+--------+--+--------+-+---------+      Left Carotid Findings:  +----------+--------+--------+--------+------------------+---------+           PSV cm/sEDV cm/sStenosisPlaque DescriptionComments   +----------+--------+--------+--------+------------------+---------+  CCA Prox  102     20                                           +----------+--------+--------+--------+------------------+---------+  CCA Distal136     16      <50%    heterogenous                  +----------+--------+--------+--------+------------------+---------+  ICA Prox  155     28      1-39%   calcific          Shadowing  +----------+--------+--------+--------+------------------+---------+  ICA Distal139     11                                           +----------+--------+--------+--------+------------------+---------+  ECA      433     14      >50%    calcific          shadowing  +----------+--------+--------+--------+------------------+---------+   +----------+--------+--------+----------------+-------------------+           PSV cm/sEDV cm/sDescribe        Arm Pressure (mmHG)  +----------+--------+--------+----------------+-------------------+  [id number]            Multiphasic, [id number]                  +----------+--------+--------+----------------+-------------------+   +---------+--------+--+--------+-+---------+  VertebralPSV cm/s50EDV cm/s8Antegrade  +---------+--------+--+--------+-+---------+         Summary:  Right Carotid: Velocities in the right ICA are consistent with a 1-39%  stenosis.                Non-hemodynamically significant plaque <50% noted in the  CCA. The                 ECA appears >50% stenosed.   Left Carotid: Velocities in the left ICA are consistent with a 1-39%  stenosis.               Non-hemodynamically significant plaque <50% noted in the  CCA. The                ECA appears >50% stenosed.   Vertebrals:  Bilateral vertebral arteries demonstrate antegrade flow.  Subclavians: Right subclavian artery was stenotic. Right subclavian artery  flow              was disturbed. Normal flow hemodynamics were seen in the left               subclavian artery.       Assessment/Plan:    77 year old female with bilateral carotid bruits and supraclavicular bruit on the right with stenosis of the subclavian artery which appears asymptomatic with preserved right radial pulse.  Patient has never had a  stroke, TIA or amaurosis and she is medically managed on aspirin, Plavix and a statin.  She can follow-up in 2 years with repeat carotid duplex at which time if she remains asymptomatic would likely  be 5-year follow-up.  All questions answered in the presence of her grandson.     Maeola Harman MD [facility]

## 2023-07-05 ENCOUNTER — Encounter: Payer: PPO | Admitting: Vascular Surgery

## 2023-07-26 ENCOUNTER — Inpatient Hospital Stay
Admission: RE | Admit: 2023-07-26 | Discharge: 2023-07-26 | Disposition: A | Discharge status: Home / Self Care | Payer: PPO | Source: Ambulatory Visit | Attending: Family Medicine | Admitting: Family Medicine

## 2023-07-26 DIAGNOSIS — M8580 Other specified disorders of bone density and structure, unspecified site: Secondary | ICD-10-CM

## 2023-09-29 ENCOUNTER — Other Ambulatory Visit: Payer: Self-pay | Admitting: Cardiology

## 2023-10-16 ENCOUNTER — Other Ambulatory Visit: Payer: Self-pay | Admitting: Cardiology

## 2023-11-07 ENCOUNTER — Other Ambulatory Visit: Payer: Self-pay | Admitting: Cardiology

## 2023-12-12 ENCOUNTER — Other Ambulatory Visit (HOSPITAL_COMMUNITY): Payer: Self-pay | Admitting: Family Medicine

## 2023-12-12 DIAGNOSIS — R109 Unspecified abdominal pain: Secondary | ICD-10-CM

## 2023-12-15 ENCOUNTER — Ambulatory Visit (HOSPITAL_COMMUNITY)

## 2023-12-26 ENCOUNTER — Ambulatory Visit (HOSPITAL_COMMUNITY)

## 2023-12-27 ENCOUNTER — Other Ambulatory Visit: Payer: Self-pay | Admitting: Cardiology

## 2023-12-30 ENCOUNTER — Ambulatory Visit (HOSPITAL_COMMUNITY)
Admission: RE | Admit: 2023-12-30 | Discharge: 2023-12-30 | Disposition: A | Discharge status: Home / Self Care | Source: Ambulatory Visit | Attending: Family Medicine | Admitting: Family Medicine

## 2023-12-30 DIAGNOSIS — R109 Unspecified abdominal pain: Secondary | ICD-10-CM | POA: Diagnosis present

## 2024-02-06 ENCOUNTER — Other Ambulatory Visit: Payer: Self-pay | Admitting: Cardiology

## 2024-03-26 ENCOUNTER — Other Ambulatory Visit: Payer: Self-pay

## 2024-03-26 MED ORDER — AMLODIPINE BESYLATE 10 MG PO TABS: 10.0000 mg | ORAL_TABLET | Freq: Every day | ORAL | 0 refills | Status: DC

## 2024-04-30 ENCOUNTER — Other Ambulatory Visit: Payer: Self-pay | Admitting: Cardiology

## 2024-04-30 ENCOUNTER — Other Ambulatory Visit

## 2024-04-30 ENCOUNTER — Telehealth: Payer: Self-pay | Admitting: Cardiology

## 2024-04-30 DIAGNOSIS — R002 Palpitations: Secondary | ICD-10-CM

## 2024-04-30 DIAGNOSIS — R000 Tachycardia, unspecified: Secondary | ICD-10-CM

## 2024-04-30 DIAGNOSIS — I10 Essential (primary) hypertension: Secondary | ICD-10-CM

## 2024-04-30 NOTE — Telephone Encounter (Signed)
 Alice Wilbert SAUNDERS, MD to Me   04/30/24  1:03 PM Please get a 2-week Zio patch for palpitations.  Check blood pressure twice daily for a week and call with results  Called patient with Dr. Dorine advisement. Patient agreed to plan. Will place order for monitor.

## 2024-04-30 NOTE — Progress Notes (Unsigned)
 Enrolled for [employer] to mail a ZIO XT long term holter monitor to the patients address on file.

## 2024-04-30 NOTE — Telephone Encounter (Signed)
 STAT if HR is under 50 or over 120  (normal HR is 60-100 beats per minute)  What is your heart rate? 120; 90; 77; 110; 49 (stated that she was not doing any activities)  Do you have a log of your heart rate readings (document readings)?    Do you have any other symptoms? Thinks her bp got high as well. Please advise

## 2024-04-30 NOTE — Telephone Encounter (Signed)
 Called patient back about message. Patient stated her HR was high at 120 for about an hour, and SBP in the 170's last night. Had patient take BP and HR now, while on phone, it is 174/85 and HR 94. Patient stated she feels fine right now, just tired. Will send to Dr. Shlomo for advisement.

## 2024-05-02 ENCOUNTER — Telehealth: Payer: Self-pay | Admitting: Cardiology

## 2024-05-02 NOTE — Telephone Encounter (Signed)
 Patient stated she received her heart monitor and will need assistance placing the monitor.

## 2024-05-03 NOTE — Telephone Encounter (Signed)
 Returned call to patient. She was told to call and have it applied in the office but asked if she could apply herself. She has worn one before and also has her grandson to help. She knows to call back if she has any dificulties

## 2024-05-30 ENCOUNTER — Ambulatory Visit: Payer: Self-pay | Admitting: Cardiology

## 2024-05-30 DIAGNOSIS — R000 Tachycardia, unspecified: Secondary | ICD-10-CM

## 2024-05-30 DIAGNOSIS — I442 Atrioventricular block, complete: Secondary | ICD-10-CM

## 2024-05-30 DIAGNOSIS — I10 Essential (primary) hypertension: Secondary | ICD-10-CM

## 2024-05-30 DIAGNOSIS — R002 Palpitations: Secondary | ICD-10-CM

## 2024-05-30 NOTE — Telephone Encounter (Signed)
-----   Message from Wilbert Bihari sent at 05/30/2024  3:44 PM EDT ----- Patient continues to have episodes of complete heart block at night lasting as long as 3.8 seconds with a heart rate of 60 bpm.  Please find out if she is having any symptoms ----- Message ----- From: Bihari Wilbert SAUNDERS, MD Sent: 05/30/2024   3:37 PM EDT To: Wilbert SAUNDERS Bihari, MD

## 2024-05-30 NOTE — Telephone Encounter (Signed)
 Call to patient to discuss results of heart monitor. Discussed that patient continues to have episodes of complete heart block at night lasting as long as 3.8 seconds with a heart rate of 60 bpm. Patient endorses that she hasn't felt well lately, she thinks she had had more frequent palpitations. She states she has also checked her heart rate during the day and found it to be lower than normal, sometimes in the 40's. She denies any chest pain or SOB but does endorse fatigue. Patient responses forwarded to Dr. Shlomo.

## 2024-05-31 ENCOUNTER — Telehealth: Payer: Self-pay | Admitting: Cardiology

## 2024-05-31 NOTE — Telephone Encounter (Signed)
   Cardiac Monitor Alert  Date of alert:  05/31/2024   Patient Name: Alice Cole  DOB: Feb 28, 1946  MRN: [id number]   [facility] Cardiologist: Wilbert Bihari, MD  [facility] EP:  None    Monitor Information: ZIO Cardiac Event Monitor [[employer]]   Complete heart block at 50 bpm and lasted 12.4 seconds. Seen on page 13-14 of report in epic and seen on strip 7.   :1}  Alert Complete Heart Block  Corean Fitch, RN  05/31/2024 8:21 AM

## 2024-05-31 NOTE — Telephone Encounter (Signed)
 The date is 05/19/24 at 3:20 am from the report in epic. I was just given the call and wanted to route it to you.

## 2024-05-31 NOTE — Telephone Encounter (Signed)
 Calling to report abnormal Zio results. Call transferred

## 2024-06-01 NOTE — Telephone Encounter (Signed)
-----   Message from Wilbert Bihari sent at 05/31/2024 11:53 PM EDT ----- This patient needs an ASAP appt with EP on Monday please for intermittent complete heart block ----- Message ----- From: Janit Geni CROME, RN Sent: 05/30/2024   5:52 PM EDT To: Wilbert JONELLE Bihari, MD  ----- Message from Geni CROME Janit, RN sent at 05/30/2024  5:52 PM EDT -----   ----- Message ----- From: Bihari Wilbert JONELLE, MD Sent: 05/30/2024   3:44 PM EDT To: Geni CROME Janit, RN  Patient continues to have episodes of complete heart block at night lasting as long as 3.8 seconds with a heart rate of 60 bpm.  Please find out if she is having any symptoms ----- Message ----- From: Bihari Wilbert JONELLE, MD Sent: 05/30/2024   3:37 PM EDT To: Wilbert JONELLE Bihari, MD

## 2024-06-01 NOTE — Addendum Note (Signed)
 Addended by: JANIT GENI CROME on: 06/01/2024 05:44 PM   Modules accepted: Orders

## 2024-06-01 NOTE — Telephone Encounter (Signed)
 Stat referral to EP placed. MC sent to patient.

## 2024-06-04 ENCOUNTER — Other Ambulatory Visit: Payer: Self-pay | Admitting: Family Medicine

## 2024-06-04 DIAGNOSIS — Z1231 Encounter for screening mammogram for malignant neoplasm of breast: Secondary | ICD-10-CM

## 2024-06-06 NOTE — Progress Notes (Unsigned)
 Electrophysiology Office Note:   Date:  06/08/2024  ID:  Aleecia, Tapia [birth year], MRN [id number]  Primary Cardiologist: Wilbert Bihari, MD Electrophysiologist: Fonda Kitty, MD      History of Present Illness:   Alice Cole is a 78 y.o. female with h/o CAD s/p PCI to [facility] in 2023, PVCs, DM, HTN, HLD, OSA who is being seen today for evaluation of nocturnal bradycardia.   Discussed the use of AI scribe software for clinical note transcription with the patient, who gave verbal consent to proceed.  History of Present Illness Alice Cole is a 78 year old female who presents with concerns for an abnormal Zio monitor. She was evaluated by Dr. Inocencio two years ago for similar concerns.  She was told that when she sleeps, her heart rate drifts down and that the lower part of her heart beats fast, with episodes lasting about thirty seconds. During the day, she has episodes where her heart starts racing, and her heart rate sometimes drops to around forty-nine beats per minute. She uses a pulse oximeter to check her heart rate.  She has a history of sleep apnea and uses a CPAP machine, which she had stopped using for a prolonged period of time when her son was sick but has recently resumed. She was not wearing the CPAP during the time she wore the heart monitor. She has worn multiple heart monitors over the years, with a previous evaluation by Dr. Inocencio, who concluded that she did not need a pacemaker at that time.  No episodes of passing out have been experienced.  Denies any dizziness, lightheadedness.     Review of systems complete and found to be negative unless listed in HPI.   EP Information / Studies Reviewed:    EKG is ordered today. Personal review as below.  EKG Interpretation Date/Time:  Thursday June 07 2024 07:58:42 EDT Ventricular Rate:  89 PR Interval:  198 QRS Duration:  78 QT Interval:  362 QTC Calculation: 440 R Axis:   -61  Text Interpretation: Normal sinus rhythm  Left axis deviation Low voltage QRS Cannot rule out Anterior infarct (cited on or before 04-May-2022) When compared with ECG of 04-May-2022 07:11, Criteria for Inferior infarct are no longer Present Nonspecific T wave abnormality no longer evident in Inferior leads Nonspecific T wave abnormality, improved in Anterolateral leads QT has shortened Confirmed by Kitty Fonda [phone]) on 06/08/2024 12:08:29 PM   Zio 05/10/24:  Echo 05/02/22:  1. Left ventricular ejection fraction, by estimation, is 65 to 70%. Left  ventricular ejection fraction by 3D volume is 63 %. The left ventricle has  normal function. The left ventricle has no regional wall motion  abnormalities. There is moderate  asymmetric left ventricular hypertrophy of the basal-septal segment. Left  ventricular diastolic parameters are consistent with Grade I diastolic  dysfunction (impaired relaxation).   2. Right ventricular systolic function is normal. The right ventricular  size is normal. Tricuspid regurgitation signal is inadequate for assessing  PA pressure.   3. The mitral valve is normal in structure. No evidence of mitral valve  regurgitation. No evidence of mitral stenosis.   4. The aortic valve is tricuspid. There is mild thickening of the aortic  valve. Aortic valve regurgitation is not visualized. Aortic valve  sclerosis is present, with no evidence of aortic valve stenosis.   LHC 05/03/22:    Ost RCA to Prox RCA lesion is 95% stenosed.   Prox Cx lesion is 50% stenosed.  A drug-eluting stent was successfully placed using a SYNERGY XD 3.0X16.   Post intervention, there is a 10% residual stenosis.   1.  Severe ostial RCA stenosis (RFR equals 0.36) treated successfully with high-pressure balloon angioplasty and stenting using a 3.0 x 16 mm Synergy DES 2.  Moderate proximal left circumflex stenosis with negative RFR evaluation of 0.96 3.  Widely patent left main 4.  Patent LAD with mild diffuse calcific plaquing  Physical  Exam:   VS:  BP (!) 142/80   Pulse 89   Ht 5' 7 (1.702 m)   Wt 200 lb (90.7 kg)   SpO2 95%   BMI 31.32 kg/m    Wt Readings from Last 3 Encounters:  06/07/24 200 lb (90.7 kg)  06/23/23 210 lb 12.8 oz (95.6 kg)  05/10/23 207 lb (93.9 kg)     GEN: Well nourished, well developed in no acute distress NECK: No JVD CARDIAC: Normal rate, regular rhythm. RESPIRATORY:  Clear to auscultation without rales, wheezing or rhonchi  ABDOMEN: Soft, non-distended EXTREMITIES:  No edema; No deformity   ASSESSMENT AND PLAN:     #.  Nocturnal bradycardia and physiologic AV block: Patient had nocturnal bradycardia and intermittent AV block that occurred during sleep.  Patient reports that she was noncompliant with her CPAP during the period that she was wearing the monitor.   - Encourage compliance with CPAP. - No indication for permanent pacemaker based on bradycardia during sleep.  No evidence of symptomatic bradycardia or AV block during daytime.  #.  PVCs: Burden of less than 1%.  Likely the etiology for her palpitations. - Risks of medical therapy for suppression or catheter ablation exceed the benefits with such a low burden.  #.  Obstructive sleep apnea: Likely exacerbating her nocturnal bradycardia. -CPAP compliance encouraged   Follow up with EP Team as needed.   Signed, Fonda Kitty, MD

## 2024-06-07 ENCOUNTER — Ambulatory Visit: Attending: Cardiology | Admitting: Cardiology

## 2024-06-07 ENCOUNTER — Encounter: Payer: Self-pay | Admitting: Cardiology

## 2024-06-07 VITALS — BP 142/80 | HR 89 | Ht 67.0 in | Wt 200.0 lb

## 2024-06-07 DIAGNOSIS — I442 Atrioventricular block, complete: Secondary | ICD-10-CM | POA: Diagnosis not present

## 2024-06-07 DIAGNOSIS — R001 Bradycardia, unspecified: Secondary | ICD-10-CM | POA: Diagnosis not present

## 2024-06-07 DIAGNOSIS — R002 Palpitations: Secondary | ICD-10-CM

## 2024-06-07 DIAGNOSIS — I493 Ventricular premature depolarization: Secondary | ICD-10-CM

## 2024-06-07 DIAGNOSIS — G4733 Obstructive sleep apnea (adult) (pediatric): Secondary | ICD-10-CM

## 2024-06-07 NOTE — Patient Instructions (Signed)
 Medication Instructions:  Your physician recommends that you continue on your current medications as directed. Please refer to the Current Medication list given to you today.  *If you need a refill on your cardiac medications before your next appointment, please call your pharmacy*  Follow-Up: At [facility], you and your health needs are our priority.  As part of our continuing mission to provide you with exceptional heart care, our providers are all part of one team.  This team includes your primary Cardiologist (physician) and Advanced Practice Providers or APPs (Physician Assistants and Nurse Practitioners) who all work together to provide you with the care you need, when you need it.  Your next appointment:   Follow up with Dr. Daneil Dunker as needed

## 2024-06-09 ENCOUNTER — Encounter (HOSPITAL_COMMUNITY): Payer: Self-pay | Admitting: Emergency Medicine

## 2024-06-09 ENCOUNTER — Ambulatory Visit (HOSPITAL_COMMUNITY)
Admission: EM | Admit: 2024-06-09 | Discharge: 2024-06-09 | Disposition: A | Discharge status: Home / Self Care | Attending: Family Medicine | Admitting: Family Medicine

## 2024-06-09 DIAGNOSIS — R600 Localized edema: Secondary | ICD-10-CM

## 2024-06-09 DIAGNOSIS — L03211 Cellulitis of face: Secondary | ICD-10-CM

## 2024-06-09 MED ORDER — AMOXICILLIN-POT CLAVULANATE 875-125 MG PO TABS: 1.0000 | ORAL_TABLET | Freq: Two times a day (BID) | ORAL | 0 refills | Status: AC

## 2024-06-09 NOTE — ED Provider Notes (Signed)
 [facility]    CSN: [id number] Arrival date & time: 06/09/24  1247      History   Chief Complaint Chief Complaint  Patient presents with   Facial Swelling    HPI Alice Cole is a 78 y.o. female.   HPI Here for pain around her left ear and swelling of her left jaw.  She first noticed the discomfort yesterday afternoon.  Then this morning she noted swelling of the area of her jaw anterior to the left ear.  No fever or chills noted.  No upper respiratory symptoms.  She is allergic to numerous things, including Trovan (a quinolone), cephalexin, and sulfa.  Cephalexin causes GI upset.  She states she tolerates Augmentin  well.  (Of note, penicillin G is listed as an allergy, causing itching, in her allergy list)  Last EGFR was in the 50s.  She states that in the past she has had swelling of this area and has had salivary gland stones in the past.  Is not usually this painful. Past Medical History:  Diagnosis Date   Abnormal EKG 07/09/2016   Abnormal liver function    Adenomatous colon polyp    Arthritis    Bigeminy    CAD (coronary artery disease), native coronary artery    50% LCx, 95% lRCA and mild LAD disease s/p PCI RCA 04/2022   Carotid artery disease (HCC)    /   Colonic polyp    Dermatitis    Diabetes mellitus    Displaced fracture of right femoral neck (HCC) 11/24/2018   Diverticulitis    recurrent   DVT (deep venous thrombosis) (HCC)    Esophageal stricture    GERD (gastroesophageal reflux disease)    Hemorrhoids    Hyperlipidemia    Hypertension    OSA (obstructive sleep apnea) 07/09/2016   last PSG showed no significant OSA   PVC's (premature ventricular contractions)     Patient Active Problem List   Diagnosis Date Noted   Coronary artery disease involving native coronary artery of native heart without angina pectoris 06/23/2022   Orthostatic hypotension 06/23/2022   Mobitz type 1 second degree AV block 06/01/2022   Chronic kidney  disease, stage 3a (HCC) 05/01/2022   NSTEMI (non-ST elevated myocardial infarction) (HCC) 05/01/2022   OSA (obstructive sleep apnea) 07/09/2016   Abnormal EKG 07/09/2016   Premature ventricular contraction 12/27/2011   Essential hypertension, benign 11/02/2010   DOE (dyspnea on exertion) 06/26/2010   Hyperlipidemia 12/25/2009   BIGEMINY 12/25/2009   Type 2 diabetes mellitus with chronic kidney disease, with long-term current use of insulin  (HCC) 01/01/2009   History of colonic polyps 12/26/2008   ADENOMATOUS COLONIC POLYP 12/17/2008   HEMORRHOIDS, INTERNAL 12/17/2008   ESOPHAGEAL STRICTURE 12/17/2008   GERD 12/17/2008   DIVERTICULOSIS, COLON 12/17/2008    Past Surgical History:  Procedure Laterality Date   ABDOMINAL HYSTERECTOMY     CHOLECYSTECTOMY     aprox 10 years ago   CORONARY PRESSURE/FFR STUDY N/A 05/03/2022   Procedure: INTRAVASCULAR PRESSURE WIRE/FFR STUDY;  Surgeon: Wonda Sharper, MD;  Location: [facility] INVASIVE CV LAB;  Service: Cardiovascular;  Laterality: N/A;   CORONARY STENT INTERVENTION N/A 05/03/2022   Procedure: CORONARY STENT INTERVENTION;  Surgeon: Wonda Sharper, MD;  Location: [facility] INVASIVE CV LAB;  Service: Cardiovascular;  Laterality: N/A;   EYE SURGERY     right and left eye   KNEE SURGERY     Right   KNEE SURGERY     Left   LEFT  HEART CATH AND CORONARY ANGIOGRAPHY N/A 05/03/2022   Procedure: LEFT HEART CATH AND CORONARY ANGIOGRAPHY;  Surgeon: Wonda Sharper, MD;  Location: [facility] INVASIVE CV LAB;  Service: Cardiovascular;  Laterality: N/A;   TOTAL HIP ARTHROPLASTY Right 11/24/2018   Procedure: TOTAL HIP ARTHROPLASTY ANTERIOR APPROACH;  Surgeon: Yvone Rush, MD;  Location: [facility];  Service: Orthopedics;  Laterality: Right;    OB History   No obstetric history on file.      Home Medications    Prior to Admission medications   Medication Sig Start Date End Date Taking? Authorizing Provider  amoxicillin -clavulanate (AUGMENTIN ) 875-125 MG tablet Take 1  tablet by mouth 2 (two) times daily for 7 days. 06/09/24 06/16/24 Yes Vonna Sharlet POUR, MD  acetaminophen  (TYLENOL ) 500 MG tablet Take 1,000 mg by mouth every 6 (six) hours as needed for moderate pain or headache.    [provider]  alendronate (FOSAMAX) 70 MG tablet Take 70 mg by mouth once a week. 10/31/21   [provider]  ALPRAZolam  (XANAX ) 0.25 MG tablet Take 0.25 mg by mouth daily as needed for anxiety.    [provider]  amLODipine  (NORVASC ) 5 MG tablet TAKE 1.5 TABLETS BY MOUTH DAILY. 12/29/23   Shlomo Wilbert SAUNDERS, MD  aspirin  81 MG tablet Take 81 mg by mouth daily.    [provider]  BD PEN NEEDLE NANO U/F 32G X 4 MM MISC  06/12/19   [provider]  cholecalciferol (VITAMIN D3) 25 MCG (1000 UT) tablet Take 1,000 Units by mouth daily.    [provider]  clopidogrel  (PLAVIX ) 75 MG tablet TAKE 1 TABLET BY MOUTH DAILY WITH BREAKFAST. 05/26/23   Shlomo Wilbert SAUNDERS, MD  ezetimibe  (ZETIA ) 10 MG tablet TAKE 1 TABLET BY MOUTH EVERY DAY 02/09/24   Shlomo Wilbert SAUNDERS, MD  hydrochlorothiazide  (HYDRODIURIL ) 25 MG tablet Take 25 mg by mouth daily.    [provider]  LANTUS  SOLOSTAR 100 UNIT/ML Solostar Pen Inject 24 Units into the skin daily. 04/01/22   [provider]  NOVOLOG  FLEXPEN 100 UNIT/ML FlexPen Inject 38-50 Units into the skin 3 (three) times daily with meals. Take 40 units in the morning, Take 38 units at lunch, Take 50 units in the evening. 03/19/22   [provider]  pantoprazole  (PROTONIX ) 40 MG tablet Take 1 tablet (40 mg total) by mouth daily. 05/05/22   Raenelle Coria, MD  pravastatin  (PRAVACHOL ) 40 MG tablet TAKE 1 TABLET BY MOUTH EVERY DAY IN THE EVENING 02/09/24   Shlomo Wilbert SAUNDERS, MD  ramipril  (ALTACE ) 10 MG capsule Take 1 capsule by mouth 2 (two) times daily.    [provider]    Family History Family History  Problem Relation Age of Onset   Heart attack Mother 94   Heart attack Brother 80   Heart  attack Maternal Grandmother 68   Heart attack Maternal Grandfather 6   Cancer Daughter        Ovarian Cancer   Diabetes Other        Grandparent   Heart disease Other        Grandparent    Social History Social History   Tobacco Use   Smoking status: Never   Smokeless tobacco: Never  Vaping Use   Vaping status: Never Used  Substance Use Topics   Alcohol use: No    Comment: used to drink 30 yrs ago   Drug use: No     Allergies   Metoprolol, Nortriptyline, Olmesartan, Tradjenta [linagliptin],  Alatrofloxacin, Atorvastatin , Beta adrenergic blockers, Cardizem  [diltiazem ], Cephalexin, Codeine, Colesevelam hcl, Crestor [rosuvastatin calcium ], Flagyl [metronidazole], Fluvastatin sodium, Levemir [insulin  detemir], Lisinopril, Penicillin g, Rosuvastatin, Sulfamethoxazole-trimethoprim, Verapamil , Adhesive [tape], Sitagliptin, and Trovan [trovafloxacin]   Review of Systems Review of Systems   Physical Exam Triage Vital Signs ED Triage Vitals  Encounter Vitals Group     BP 06/09/24 1259 135/75     Girls Systolic BP Percentile --      Girls Diastolic BP Percentile --      Boys Systolic BP Percentile --      Boys Diastolic BP Percentile --      Pulse Rate 06/09/24 1259 95     Resp 06/09/24 1259 (!) 22     Temp 06/09/24 1259 98.1 F (36.7 C)     Temp Source 06/09/24 1259 Oral     SpO2 06/09/24 1259 95 %     Weight --      Height --      Head Circumference --      Peak Flow --      Pain Score 06/09/24 1258 8     Pain Loc --      Pain Education --      Exclude from Growth Chart --    No data found.  Updated Vital Signs BP 135/75 (BP Location: Right Arm)   Pulse 95   Temp 98.1 F (36.7 C) (Oral)   Resp (!) 22   SpO2 95%   Visual Acuity Right Eye Distance:   Left Eye Distance:   Bilateral Distance:    Right Eye Near:   Left Eye Near:    Bilateral Near:     Physical Exam Vitals reviewed.  Constitutional:      General: She is not in acute distress.     Appearance: She is not ill-appearing, toxic-appearing or diaphoretic.  HENT:     Head:     Comments: There is tenderness and swelling of the area over the left parotid and extending in to the neck just underneath the angle of the jaw on the left.  It is mildly tender and warm.  There is no fluctuance.    Left Ear: Tympanic membrane and ear canal normal.     Nose: Nose normal.     Mouth/Throat:     Mouth: Mucous membranes are moist.  Eyes:     Extraocular Movements: Extraocular movements intact.     Conjunctiva/sclera: Conjunctivae normal.     Pupils: Pupils are equal, round, and reactive to light.  Cardiovascular:     Rate and Rhythm: Normal rate and regular rhythm.  Pulmonary:     Effort: Pulmonary effort is normal.     Breath sounds: Normal breath sounds.  Musculoskeletal:     Cervical back: Neck supple.  Lymphadenopathy:     Cervical: No cervical adenopathy.  Skin:    Coloration: Skin is not pale.  Neurological:     General: No focal deficit present.     Mental Status: She is alert and oriented to person, place, and time.  Psychiatric:        Behavior: Behavior normal.      UC Treatments / Results  Labs (all labs ordered are listed, but only abnormal results are displayed) Labs Reviewed - No data to display  EKG   Radiology No results found.  Procedures Procedures (including critical care time)  Medications Ordered in UC Medications - No data to display  Initial Impression / Assessment and Plan / UC Course  I have reviewed the triage vital signs and the nursing notes.  Pertinent labs & imaging results that were available during my care of the patient were reviewed by me and considered in my medical decision making (see chart for details).     Augmentin  is sent in for possible cellulitis in the parotid gland.  She is given information on salivary gland stones.  Please note, she states she tolerates Augmentin  without problem though her allergy list states she  itches the penicillin.  She will follow-up with her primary care next week; she has an appointment already.  She will go to the emergency room if she worsens in any way. She states Tylenol  has been helpful at least some, for the pain. Final Clinical Impressions(s) / UC Diagnoses   Final diagnoses:  Swelling of left parotid gland  Cellulitis of face     Discharge Instructions      Take amoxicillin -clavulanate 875 mg--1 tab twice daily with food for 7 days  Continue Tylenol  500 mg over-the-counter--2 every 6 hours as needed for pain  You can also ice the painful area.  If you worsen in any way, please go to the emergency room  I am glad you have a follow-up appointment with your doctor on September 3     ED Prescriptions     Medication Sig Dispense Auth. Provider   amoxicillin -clavulanate (AUGMENTIN ) 875-125 MG tablet Take 1 tablet by mouth 2 (two) times daily for 7 days. 14 tablet Quorra Rosene K, MD      PDMP not reviewed this encounter.   Vonna Sharlet POUR, MD 06/09/24 [phone]

## 2024-06-09 NOTE — Discharge Instructions (Signed)
 Take amoxicillin -clavulanate 875 mg--1 tab twice daily with food for 7 days  Continue Tylenol  500 mg over-the-counter--2 every 6 hours as needed for pain  You can also ice the painful area.  If you worsen in any way, please go to the emergency room  I am glad you have a follow-up appointment with your doctor on September 3

## 2024-06-09 NOTE — ED Triage Notes (Signed)
 Pt noticed left upper jaw anterior ear swelling when woke up. Reports left ear has been really itchy inside. Pt reports was unable to get into her doctor's yesterday. Pt reports it is very painful esp to touch. Denies any injury or known bite.

## 2024-06-14 ENCOUNTER — Ambulatory Visit: Attending: Cardiology | Admitting: Cardiology

## 2024-06-14 ENCOUNTER — Encounter: Payer: Self-pay | Admitting: Cardiology

## 2024-06-14 VITALS — BP 154/56 | HR 93 | Ht 67.0 in | Wt 213.0 lb

## 2024-06-14 DIAGNOSIS — I771 Stricture of artery: Secondary | ICD-10-CM

## 2024-06-14 DIAGNOSIS — I441 Atrioventricular block, second degree: Secondary | ICD-10-CM

## 2024-06-14 DIAGNOSIS — I2510 Atherosclerotic heart disease of native coronary artery without angina pectoris: Secondary | ICD-10-CM

## 2024-06-14 DIAGNOSIS — E785 Hyperlipidemia, unspecified: Secondary | ICD-10-CM

## 2024-06-14 DIAGNOSIS — I10 Essential (primary) hypertension: Secondary | ICD-10-CM

## 2024-06-14 DIAGNOSIS — I951 Orthostatic hypotension: Secondary | ICD-10-CM

## 2024-06-14 DIAGNOSIS — G4733 Obstructive sleep apnea (adult) (pediatric): Secondary | ICD-10-CM

## 2024-06-14 DIAGNOSIS — I6523 Occlusion and stenosis of bilateral carotid arteries: Secondary | ICD-10-CM

## 2024-06-14 MED ORDER — AMLODIPINE BESYLATE 10 MG PO TABS: 10.0000 mg | ORAL_TABLET | Freq: Every day | ORAL | 3 refills | Status: DC

## 2024-06-14 NOTE — Progress Notes (Signed)
 Cardiology Office Note:    Date:  06/14/2024   ID:  DEAVION STRIDER, DOB 04-19-1946, MRN [id number]  PCP:  [facility]  [facility] Providers Cardiologist:  Wilbert Bihari, MD Electrophysiologist:  Fonda Kitty, MD     Referring MD: [facility]   Chief Complaint:  Coronary Artery Disease, Hypertension, Hyperlipidemia, and Sleep Apnea    Patient Profile: Coronary artery disease  NSTEMI 04/2022 s/p 3 x 16 mm DES to pRCA Allergy to beta-blocker  PVCs Intermittent/Nocturnal heart block Eval by EP (Camnitz) in 5/23 - no indication for pacer  Diabetes mellitus  Chronic kidney disease  Hypertension  Hyperlipidemia  GERD OSA on CPAP  Hx of DVT  Prior CV Studies: NON-TELEMETRY MONITORING HOOKUP AND INTERP 05/31/2022   Predominant rhythm was normal sinus rhythm with average heart rate 86bpm and ranged from 35 to 136bpm.   Isolated PACs, atrial couplest and atrial triplets.   Isolated PVCs, ventricular couplets and triplets   Transient second degree AV block both Mobitz 1 and 2:1 block  LEFT HEART CATH 05/03/2022 LAD mild diffuse calcific plaquing LCx 50 (RFR 0.96) RCA ostial 95 PCI: 3 x 16 mm Synergy DES to the RCA   ECHO COMPLETE WO IMAGING ENHANCING AGENT 05/02/2022 EF 65-70, no RWMA, GR 1 DD, normal RVSF  LONG TERM MONITOR (8-14 DAYS) HOOK UP AND INTERPRETATION 02/12/2022  Predominant rhythm was normal sinus rhythm with an average heart rate of 76 bpm and ranged from 49 to 127 bpm.  Rare PACs  Rare PVCs, ventricular couplets and triplets as well as ventricular bigeminy and trigeminy. PVC load less than 1%.  Occasional accelerated junctional rhythm  Intermittent complete heart block occurring during sleep at 49 bpm lasting up to 7.9 seconds in duration with ventricular escape rhythm  Dr. Wendel who is DOD in the office was notified and monitor reviewed. Patient is to be set up for EP evaluation for pacemaker and go to the ER  for any symptoms.   History of Present Illness:   VIRGINA DEAKINS is a 78 y.o. female with the above problem list.  She was admitted 7/22-7/25/2023 with a NSTEMI. Cardiac catheterization demonstrated high grade disease in the pRCA which was tx with a DES. EF is preserved on echocardiogram.  She was noted to have intermittent 2nd degree HB Type I. Her diltiazem  was DC'd. An OP monitor was ordered and demonstrated transient Wenckebach.  Seen by EP and recommended watching as episode was nocturnal and was encouraged to use CPAP. She was approved for a new device but never got her CPAP so she had not used her old one in a long time.   At her last OV she said that Since her PCI ther breathing had significantly improved. She had never gotten her CPAP that was ordered.  At that office visit I reordered an AutoPap from 5 to 15 cm H2O.    When I saw her last 04/30/2023 she was doing quite well with her CPAP.  Her AHI was 0.3/h on auto CPAP from 4 to 15 cm H2O but compliance was low at 43% and she was encouraged to use her device more often.  She is now back today for follow-up and is doing well.  She denies any chest pain, PND, orthopnea, dizziness, palpitations, syncope.  She has not had any lower extremity edema.  She has chronic DOE when she walks a lot but says that it is very stable.  She is doing  well with her PAP device and thinks that she has gotten used to it.  She tolerates the mask and feels the pressure is adequate.  Since going on PAP she feels rested in the am and has no significant daytime sleepiness.  She denies any significant mouth or nasal dryness or nasal congestion.  She does not think that he snores. An Epworth Sleepiness Scale score was calculated the office today and this endorsed at 3 arguing against residual daytime sleepiness. Patient denies any episodes of bruxism, restless legs, No gagging hallucinations or cataplectic events.    Past Medical History:  Diagnosis Date   Abnormal EKG  07/09/2016   Abnormal liver function    Adenomatous colon polyp    Arthritis    Bigeminy    CAD (coronary artery disease), native coronary artery    50% LCx, 95% lRCA and mild LAD disease s/p PCI RCA 04/2022   Carotid artery disease (HCC)    /   Colonic polyp    Dermatitis    Diabetes mellitus    Displaced fracture of right femoral neck (HCC) 11/24/2018   Diverticulitis    recurrent   DVT (deep venous thrombosis) (HCC)    Esophageal stricture    GERD (gastroesophageal reflux disease)    Hemorrhoids    Hyperlipidemia    Hypertension    OSA (obstructive sleep apnea) 07/09/2016   last PSG showed no significant OSA   PVC's (premature ventricular contractions)    Current Medications: Current Meds  Medication Sig   acetaminophen  (TYLENOL ) 500 MG tablet Take 1,000 mg by mouth every 6 (six) hours as needed for moderate pain or headache.   alendronate (FOSAMAX) 70 MG tablet Take 70 mg by mouth once a week.   ALPRAZolam  (XANAX ) 0.25 MG tablet Take 0.25 mg by mouth daily as needed for anxiety.   amLODipine  (NORVASC ) 5 MG tablet TAKE 1.5 TABLETS BY MOUTH DAILY.   amoxicillin -clavulanate (AUGMENTIN ) 875-125 MG tablet Take 1 tablet by mouth 2 (two) times daily for 7 days.   aspirin  81 MG tablet Take 81 mg by mouth daily.   BD PEN NEEDLE NANO U/F 32G X 4 MM MISC    cholecalciferol (VITAMIN D3) 25 MCG (1000 UT) tablet Take 1,000 Units by mouth daily.   clopidogrel  (PLAVIX ) 75 MG tablet TAKE 1 TABLET BY MOUTH DAILY WITH BREAKFAST.   ezetimibe  (ZETIA ) 10 MG tablet TAKE 1 TABLET BY MOUTH EVERY DAY   hydrochlorothiazide  (HYDRODIURIL ) 25 MG tablet Take 25 mg by mouth daily.   LANTUS  SOLOSTAR 100 UNIT/ML Solostar Pen Inject 24 Units into the skin daily.   NOVOLOG  FLEXPEN 100 UNIT/ML FlexPen Inject 38-50 Units into the skin 3 (three) times daily with meals. Take 40 units in the morning, Take 38 units at lunch, Take 50 units in the evening.   pantoprazole  (PROTONIX ) 40 MG tablet Take 1 tablet (40 mg  total) by mouth daily.   pravastatin  (PRAVACHOL ) 40 MG tablet TAKE 1 TABLET BY MOUTH EVERY DAY IN THE EVENING   ramipril  (ALTACE ) 10 MG capsule Take 1 capsule by mouth 2 (two) times daily.    Allergies:   Metoprolol, Nortriptyline, Olmesartan, Tradjenta [linagliptin], Alatrofloxacin, Atorvastatin , Beta adrenergic blockers, Cardizem  [diltiazem ], Cephalexin, Codeine, Colesevelam hcl, Crestor [rosuvastatin calcium ], Flagyl [metronidazole], Fluvastatin sodium, Levemir [insulin  detemir], Lisinopril, Penicillin g, Rosuvastatin, Sulfamethoxazole-trimethoprim, Verapamil , Adhesive [tape], Sitagliptin, and Trovan [trovafloxacin]   Social History   Tobacco Use   Smoking status: Never   Smokeless tobacco: Never  Vaping Use   Vaping status: Never  Used  Substance Use Topics   Alcohol use: No    Comment: used to drink 30 yrs ago   Drug use: No    Family Hx: The patient's family history includes Cancer in her daughter; Diabetes in an other family member; Heart attack (age of onset: 1) in her mother; Heart attack (age of onset: 42) in her maternal grandfather; Heart attack (age of onset: 4) in her maternal grandmother; Heart attack (age of onset: 74) in her brother; Heart disease in an other family member.  Review of Systems  Gastrointestinal:  Negative for hematochezia.  Genitourinary:  Negative for hematuria.     EKGs/Labs/Other Test Reviewed:     Recent Labs: No results found for requested labs within last 365 days.   Recent Lipid Panel No results for input(s): CHOL, TRIG, HDL, VLDL, LDLCALC, LDLDIRECT in the last 8760 hours.    Risk Assessment/Calculations/Metrics:     STOP-Bang Score:           Physical Exam:    VS:  BP (!) 154/56   Pulse 93   Ht 5' 7 (1.702 m)   Wt 213 lb (96.6 kg)   SpO2 98%   BMI 33.36 kg/m     Wt Readings from Last 3 Encounters:  06/14/24 213 lb (96.6 kg)  06/07/24 200 lb (90.7 kg)  06/23/23 210 lb 12.8 oz (95.6 kg)   GEN: Well  nourished, well developed in no acute distress HEENT: Normal NECK: No JVD; bilateral carotid bruits LYMPHATICS: No lymphadenopathy CARDIAC:RRR, no murmurs, rubs, gallops RESPIRATORY:  Clear to auscultation without rales, wheezing or rhonchi  ABDOMEN: Soft, non-tender, non-distended MUSCULOSKELETAL:  No edema; No deformity  SKIN: Warm and dry NEUROLOGIC:  Alert and oriented x 3 PSYCHIATRIC:  Normal affect  ASSESSMENT & PLAN:       Coronary artery disease involving native coronary artery of native heart without angina pectoris -Status post non-STEMI in July 2023.   -She has not had any anginal symptoms since I saw her last -Continue aspirin  81 mg daily, Plavix  75 mg daily, pravastatin  40 mg daily with as needed refills -no BB due to hx of transient heart block   Orthostatic hypotension -She is not had any dizzy spells or passing out -I encouraged her to wear her compression hose during the day   Essential hypertension, benign -BP is borderline controlled on exam today -Continue HCTZ 25 mg daily with and ramipril  10 mg twice daily with as needed refills -increase Amlodipine  to 10mg  daily and check BP twice daily for a week and call with results -I will get a copy of her last BMET from her PCP  Mixed hyperlipidemia -LDL goal < 70 -I have personally reviewed and interpreted outside labs performed by patient's PCP which showed LDL 52, HDL 43 and ALT 23 on 04/26/2024 -she stopped taking the atorvastatin  due to severe muscle aches -Continue pravastatin  40 mg daily and Zetia  10 mg daily with as needed refills   Mobitz type 1 second degree AV block She has had a history of transient complete heart block during sleep.  She had some Mobitz 1 noted in the hospital.  She is now off of diltiazem .  She did have some episodes of Mobitz 1 on her monitor as well as 2-1 block.  -seen by EP and admitted to noncompliance with her PAP device>>No PPM indicated at this time -She has not had syncope or  near syncope.   -She knows to contact us  if she has any significant  dizziness or syncope -encouraged her to be more compliant with her device  OSA - The patient is tolerating PAP therapy well without any problems. The PAP download performed by his DME was personally reviewed and interpreted by me today and showed an AHI of 0.2 /hr on auto CPAP from 5-15 cm H2O with 0% compliance in using more than 4 hours nightly.  The patient has been using and benefiting from PAP use and will continue to benefit from therapy.  - She is only used her device 7 on the past 30 days and for only 1 hour at a time - I encouraged her to try to be more compliant with her device and try to use at least 5 hours nightly.  Left carotid bruit Bilateral carotid artery stenosis Right subclavian artery stenosis - Carotid Dopplers 05/13/2023 showed 1 to 39% bilateral carotid artery stenosis with right subclavian artery stenosis - She has no right arm claudication symptoms - Continue aspirin  and statin therapy - saw Dr. Sheree with Vascular Surgery and recommends repeat dopplers 2026  Followup with me in 1 year  Medication Adjustments/Labs and Tests Ordered: Current medicines are reviewed at length with the patient today.  Concerns regarding medicines are outlined above.  Tests Ordered: No orders of the defined types were placed in this encounter.  Medication Changes: No orders of the defined types were placed in this encounter.  Signed, Wilbert Bihari, MD  06/14/2024 3:52 PM    [facility] [address] [place], [place], [place]  [address] Phone: [phone]; Fax: [phone]

## 2024-06-14 NOTE — Patient Instructions (Signed)
 Medication Instructions:  Please INCREASE your dose of amlodipine  to 10 mg daily.  *If you need a refill on your cardiac medications before your next appointment, please call your pharmacy*  Lab Work: Please ask your PCP to forward us  a copy of your last BMET.  If you have labs (blood work) drawn today and your tests are completely normal, you will receive your results only by: MyChart Message (if you have MyChart) OR A paper copy in the mail If you have any lab test that is abnormal or we need to change your treatment, we will call you to review the results.  Testing/Procedures: Your physician has requested that you have a carotid duplex and a subclavian artery duplex in August 2026. This test is an ultrasound of the carotid arteries in your neck. It looks at blood flow through these arteries that supply the brain with blood. Allow one hour for this exam. There are no restrictions or special instructions.   Follow-Up: At [facility], you and your health needs are our priority.  As part of our continuing mission to provide you with exceptional heart care, our providers are all part of one team.  This team includes your primary Cardiologist (physician) and Advanced Practice Providers or APPs (Physician Assistants and Nurse Practitioners) who all work together to provide you with the care you need, when you need it.  Your next appointment:   1 year(s)  Provider:   Wilbert Bihari, MD    Please check your blood pressure twice daily for one week. Check your blood pressure once a day at lunch and once a day at dinner. Write down each reading along with the date and time, as well as the heart rate if your machine provides it. Then at the end of the week, please send the readings over MyChart, mail the readings to our office, drop the readings off at the front desk, or call our main number and give the readings to one of our operators.

## 2024-06-14 NOTE — Addendum Note (Signed)
 Addended by: JANIT GENI CROME on: 06/14/2024 04:08 PM   Modules accepted: Orders

## 2024-06-20 ENCOUNTER — Other Ambulatory Visit: Payer: Self-pay | Admitting: Cardiology

## 2024-06-21 MED ORDER — AMLODIPINE BESYLATE 10 MG PO TABS: 10.0000 mg | ORAL_TABLET | Freq: Every day | ORAL | 3 refills | Status: AC

## 2024-06-26 ENCOUNTER — Encounter: Payer: Self-pay | Admitting: Cardiology

## 2024-06-26 DIAGNOSIS — I10 Essential (primary) hypertension: Secondary | ICD-10-CM

## 2024-06-26 DIAGNOSIS — Z79899 Other long term (current) drug therapy: Secondary | ICD-10-CM

## 2024-06-27 MED ORDER — CHLORTHALIDONE 25 MG PO TABS: 25.0000 mg | ORAL_TABLET | Freq: Every day | ORAL | 3 refills | Status: DC

## 2024-06-28 ENCOUNTER — Other Ambulatory Visit: Payer: Self-pay | Admitting: Cardiology

## 2024-06-29 ENCOUNTER — Ambulatory Visit
Admission: RE | Admit: 2024-06-29 | Discharge: 2024-06-29 | Disposition: A | Discharge status: Home / Self Care | Source: Ambulatory Visit | Attending: Family Medicine | Admitting: Family Medicine

## 2024-06-29 DIAGNOSIS — Z1231 Encounter for screening mammogram for malignant neoplasm of breast: Secondary | ICD-10-CM

## 2024-07-26 ENCOUNTER — Telehealth: Payer: Self-pay

## 2024-07-26 MED ORDER — HYDROCHLOROTHIAZIDE 25 MG PO TABS: 25.0000 mg | ORAL_TABLET | Freq: Every day | ORAL | 3 refills | Status: AC

## 2024-07-26 NOTE — Telephone Encounter (Signed)
 Updated med list to show chlorthalidone  is not being currently taken, and 25 mg hydrochlorothiazide  is being taken daily.

## 2025-05-14 ENCOUNTER — Encounter (HOSPITAL_COMMUNITY)

## 2025-05-14 ENCOUNTER — Other Ambulatory Visit (HOSPITAL_COMMUNITY)
